# Patient Record
Sex: Female | Born: 1935 | Race: White | Hispanic: No | Marital: Married | Smoking: Former smoker
Health system: Southern US, Community
[De-identification: ages and names within clinical notes are randomized; demographics above are authoritative.]

## PROBLEM LIST (undated history)

## (undated) ENCOUNTER — Emergency Department (HOSPITAL_COMMUNITY): Admission: EM | Payer: Medicare Other | Source: Home / Self Care

## (undated) DIAGNOSIS — I10 Essential (primary) hypertension: Secondary | ICD-10-CM

## (undated) DIAGNOSIS — M1990 Unspecified osteoarthritis, unspecified site: Secondary | ICD-10-CM

## (undated) DIAGNOSIS — S32402A Unspecified fracture of left acetabulum, initial encounter for closed fracture: Secondary | ICD-10-CM

## (undated) DIAGNOSIS — I739 Peripheral vascular disease, unspecified: Secondary | ICD-10-CM

## (undated) DIAGNOSIS — I219 Acute myocardial infarction, unspecified: Secondary | ICD-10-CM

## (undated) DIAGNOSIS — F329 Major depressive disorder, single episode, unspecified: Secondary | ICD-10-CM

## (undated) DIAGNOSIS — H3500 Unspecified background retinopathy: Secondary | ICD-10-CM

## (undated) DIAGNOSIS — I2510 Atherosclerotic heart disease of native coronary artery without angina pectoris: Secondary | ICD-10-CM

## (undated) DIAGNOSIS — I679 Cerebrovascular disease, unspecified: Secondary | ICD-10-CM

## (undated) DIAGNOSIS — E039 Hypothyroidism, unspecified: Secondary | ICD-10-CM

## (undated) DIAGNOSIS — Z992 Dependence on renal dialysis: Secondary | ICD-10-CM

## (undated) DIAGNOSIS — E7800 Pure hypercholesterolemia, unspecified: Secondary | ICD-10-CM

## (undated) DIAGNOSIS — F32A Depression, unspecified: Secondary | ICD-10-CM

## (undated) DIAGNOSIS — I5032 Chronic diastolic (congestive) heart failure: Secondary | ICD-10-CM

## (undated) DIAGNOSIS — K449 Diaphragmatic hernia without obstruction or gangrene: Secondary | ICD-10-CM

## (undated) DIAGNOSIS — N186 End stage renal disease: Secondary | ICD-10-CM

## (undated) HISTORY — DX: Pure hypercholesterolemia, unspecified: E78.00

## (undated) HISTORY — DX: Unspecified fracture of left acetabulum, initial encounter for closed fracture: S32.402A

## (undated) HISTORY — DX: Atherosclerotic heart disease of native coronary artery without angina pectoris: I25.10

## (undated) HISTORY — DX: Essential (primary) hypertension: I10

## (undated) HISTORY — DX: Diaphragmatic hernia without obstruction or gangrene: K44.9

## (undated) HISTORY — PX: VITRECTOMY: SHX106

## (undated) HISTORY — PX: EYE SURGERY: SHX253

## (undated) HISTORY — DX: Peripheral vascular disease, unspecified: I73.9

## (undated) HISTORY — DX: Cerebrovascular disease, unspecified: I67.9

## (undated) HISTORY — DX: Unspecified osteoarthritis, unspecified site: M19.90

## (undated) HISTORY — DX: Dependence on renal dialysis: Z99.2

## (undated) HISTORY — DX: Unspecified background retinopathy: H35.00

## (undated) HISTORY — DX: Hypothyroidism, unspecified: E03.9

---
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
## 1998-08-14 ENCOUNTER — Other Ambulatory Visit: Admission: RE | Admit: 1998-08-14 | Discharge: 1998-08-14 | Payer: Self-pay | Admitting: Obstetrics and Gynecology

## 1999-03-27 ENCOUNTER — Encounter: Payer: Self-pay | Admitting: Ophthalmology

## 1999-04-01 ENCOUNTER — Ambulatory Visit (HOSPITAL_COMMUNITY): Admission: RE | Admit: 1999-04-01 | Discharge: 1999-04-01 | Payer: Self-pay | Admitting: Ophthalmology

## 1999-08-08 ENCOUNTER — Other Ambulatory Visit: Admission: RE | Admit: 1999-08-08 | Discharge: 1999-08-08 | Payer: Self-pay | Admitting: Obstetrics and Gynecology

## 2000-05-10 ENCOUNTER — Encounter: Payer: Self-pay | Admitting: Emergency Medicine

## 2000-05-10 ENCOUNTER — Inpatient Hospital Stay (HOSPITAL_COMMUNITY): Admission: EM | Admit: 2000-05-10 | Discharge: 2000-05-12 | Payer: Self-pay | Admitting: Emergency Medicine

## 2000-05-12 ENCOUNTER — Encounter: Payer: Self-pay | Admitting: Cardiology

## 2000-09-15 ENCOUNTER — Other Ambulatory Visit: Admission: RE | Admit: 2000-09-15 | Discharge: 2000-09-15 | Payer: Self-pay | Admitting: Obstetrics and Gynecology

## 2001-10-21 ENCOUNTER — Other Ambulatory Visit: Admission: RE | Admit: 2001-10-21 | Discharge: 2001-10-21 | Payer: Self-pay | Admitting: Obstetrics and Gynecology

## 2001-11-02 ENCOUNTER — Encounter: Payer: Self-pay | Admitting: Ophthalmology

## 2001-11-04 ENCOUNTER — Ambulatory Visit (HOSPITAL_COMMUNITY): Admission: RE | Admit: 2001-11-04 | Discharge: 2001-11-04 | Payer: Self-pay | Admitting: Ophthalmology

## 2002-11-06 ENCOUNTER — Other Ambulatory Visit: Admission: RE | Admit: 2002-11-06 | Discharge: 2002-11-06 | Payer: Self-pay | Admitting: Obstetrics and Gynecology

## 2002-12-01 ENCOUNTER — Emergency Department (HOSPITAL_COMMUNITY): Admission: EM | Admit: 2002-12-01 | Discharge: 2002-12-02 | Payer: Self-pay | Admitting: Emergency Medicine

## 2003-07-27 ENCOUNTER — Ambulatory Visit (HOSPITAL_COMMUNITY): Admission: RE | Admit: 2003-07-27 | Discharge: 2003-07-27 | Payer: Self-pay | Admitting: *Deleted

## 2003-07-27 ENCOUNTER — Encounter: Payer: Self-pay | Admitting: *Deleted

## 2003-07-27 HISTORY — PX: OTHER SURGICAL HISTORY: SHX169

## 2004-03-19 ENCOUNTER — Other Ambulatory Visit: Admission: RE | Admit: 2004-03-19 | Discharge: 2004-03-19 | Payer: Self-pay | Admitting: Obstetrics and Gynecology

## 2005-01-20 ENCOUNTER — Ambulatory Visit: Payer: Self-pay | Admitting: Pulmonary Disease

## 2005-04-10 ENCOUNTER — Other Ambulatory Visit: Admission: RE | Admit: 2005-04-10 | Discharge: 2005-04-10 | Payer: Self-pay | Admitting: Obstetrics and Gynecology

## 2005-05-26 ENCOUNTER — Ambulatory Visit: Payer: Self-pay | Admitting: Pulmonary Disease

## 2005-05-27 ENCOUNTER — Ambulatory Visit: Payer: Self-pay | Admitting: Pulmonary Disease

## 2005-05-29 ENCOUNTER — Ambulatory Visit: Payer: Self-pay | Admitting: Cardiology

## 2005-06-17 ENCOUNTER — Ambulatory Visit: Payer: Self-pay

## 2005-07-17 ENCOUNTER — Ambulatory Visit: Payer: Self-pay | Admitting: Cardiology

## 2005-08-14 ENCOUNTER — Ambulatory Visit: Payer: Self-pay | Admitting: Cardiology

## 2005-09-25 ENCOUNTER — Ambulatory Visit: Payer: Self-pay | Admitting: Pulmonary Disease

## 2005-09-30 ENCOUNTER — Ambulatory Visit: Payer: Self-pay | Admitting: Pulmonary Disease

## 2006-01-26 ENCOUNTER — Ambulatory Visit: Payer: Self-pay | Admitting: Pulmonary Disease

## 2006-02-26 ENCOUNTER — Ambulatory Visit: Payer: Self-pay | Admitting: Pulmonary Disease

## 2006-03-15 ENCOUNTER — Emergency Department (HOSPITAL_COMMUNITY): Admission: EM | Admit: 2006-03-15 | Discharge: 2006-03-15 | Payer: Self-pay | Admitting: Emergency Medicine

## 2006-03-30 ENCOUNTER — Ambulatory Visit: Payer: Self-pay | Admitting: Pulmonary Disease

## 2006-06-08 ENCOUNTER — Ambulatory Visit: Payer: Self-pay | Admitting: Pulmonary Disease

## 2006-06-16 ENCOUNTER — Ambulatory Visit: Payer: Self-pay | Admitting: Cardiology

## 2006-08-17 ENCOUNTER — Ambulatory Visit: Payer: Self-pay

## 2006-10-01 ENCOUNTER — Ambulatory Visit: Payer: Self-pay | Admitting: Pulmonary Disease

## 2006-10-13 ENCOUNTER — Ambulatory Visit: Payer: Self-pay | Admitting: Pulmonary Disease

## 2007-01-11 ENCOUNTER — Ambulatory Visit: Payer: Self-pay

## 2007-02-01 ENCOUNTER — Ambulatory Visit: Payer: Self-pay | Admitting: Pulmonary Disease

## 2007-02-08 ENCOUNTER — Ambulatory Visit: Payer: Self-pay | Admitting: Pulmonary Disease

## 2007-02-08 LAB — CONVERTED CEMR LAB
ALT: 23 units/L (ref 0–40)
BUN: 19 mg/dL (ref 6–23)
Basophils Absolute: 0 10*3/uL (ref 0.0–0.1)
Bilirubin, Direct: 0.1 mg/dL (ref 0.0–0.3)
Calcium: 9.2 mg/dL (ref 8.4–10.5)
Cholesterol: 158 mg/dL (ref 0–200)
Eosinophils Absolute: 0.1 10*3/uL (ref 0.0–0.6)
GFR calc Af Amer: 91 mL/min
GFR calc non Af Amer: 76 mL/min
Glucose, Bld: 164 mg/dL — ABNORMAL HIGH (ref 70–99)
Ketones, ur: NEGATIVE mg/dL
MCHC: 33.9 g/dL (ref 30.0–36.0)
MCV: 91.6 fL (ref 78.0–100.0)
Monocytes Relative: 9.9 % (ref 3.0–11.0)
Platelets: 234 10*3/uL (ref 150–400)
Potassium: 4 meq/L (ref 3.5–5.1)
RBC / HPF: NONE SEEN
RBC: 3.76 M/uL — ABNORMAL LOW (ref 3.87–5.11)
TSH: 2 microintl units/mL (ref 0.35–5.50)
Total CHOL/HDL Ratio: 3.7
Triglycerides: 119 mg/dL (ref 0–149)
Urine Glucose: NEGATIVE mg/dL
WBC: 4.7 10*3/uL (ref 4.5–10.5)
pH: 7.5 (ref 5.0–8.0)

## 2007-02-23 ENCOUNTER — Ambulatory Visit: Payer: Self-pay | Admitting: Cardiology

## 2007-02-23 LAB — CONVERTED CEMR LAB
BUN: 19 mg/dL (ref 6–23)
Basophils Absolute: 0 10*3/uL (ref 0.0–0.1)
Basophils Relative: 0.6 % (ref 0.0–1.0)
CO2: 29 meq/L (ref 19–32)
Calcium: 9.2 mg/dL (ref 8.4–10.5)
Chloride: 105 meq/L (ref 96–112)
Creatinine, Ser: 0.8 mg/dL (ref 0.4–1.2)
Hemoglobin: 12 g/dL (ref 12.0–15.0)
INR: 0.9 (ref 0.9–2.0)
MCHC: 34.6 g/dL (ref 30.0–36.0)
Monocytes Absolute: 0.5 10*3/uL (ref 0.2–0.7)
Monocytes Relative: 7.7 % (ref 3.0–11.0)
Platelets: 238 10*3/uL (ref 150–400)
Prothrombin Time: 11.4 s (ref 10.0–14.0)
RBC: 3.8 M/uL — ABNORMAL LOW (ref 3.87–5.11)
RDW: 12.9 % (ref 11.5–14.6)
aPTT: 26.1 s — ABNORMAL LOW (ref 26.5–36.5)

## 2007-03-02 ENCOUNTER — Ambulatory Visit (HOSPITAL_COMMUNITY): Admission: RE | Admit: 2007-03-02 | Discharge: 2007-03-02 | Payer: Self-pay | Admitting: Cardiology

## 2007-04-06 ENCOUNTER — Ambulatory Visit: Payer: Self-pay

## 2007-06-03 ENCOUNTER — Ambulatory Visit: Payer: Self-pay | Admitting: Pulmonary Disease

## 2007-08-22 ENCOUNTER — Ambulatory Visit: Payer: Self-pay | Admitting: Pulmonary Disease

## 2007-08-22 LAB — CONVERTED CEMR LAB
ALT: 25 units/L (ref 0–35)
AST: 24 units/L (ref 0–37)
Basophils Relative: 0.1 % (ref 0.0–1.0)
Bilirubin, Direct: 0.2 mg/dL (ref 0.0–0.3)
CO2: 23 meq/L (ref 19–32)
Calcium: 9.6 mg/dL (ref 8.4–10.5)
Eosinophils Absolute: 0.1 10*3/uL (ref 0.0–0.6)
Eosinophils Relative: 1.9 % (ref 0.0–5.0)
GFR calc Af Amer: 80 mL/min
GFR calc non Af Amer: 66 mL/min
Glucose, Bld: 171 mg/dL — ABNORMAL HIGH (ref 70–99)
HCT: 33.3 % — ABNORMAL LOW (ref 36.0–46.0)
Hemoglobin: 11.3 g/dL — ABNORMAL LOW (ref 12.0–15.0)
Hgb A1c MFr Bld: 8.7 % — ABNORMAL HIGH (ref 4.6–6.0)
Lymphocytes Relative: 25.4 % (ref 12.0–46.0)
MCV: 93.2 fL (ref 78.0–100.0)
Neutro Abs: 4.9 10*3/uL (ref 1.4–7.7)
Neutrophils Relative %: 63.7 % (ref 43.0–77.0)
Platelets: 221 10*3/uL (ref 150–400)
Sodium: 143 meq/L (ref 135–145)
Total Protein: 7.1 g/dL (ref 6.0–8.3)
WBC: 7.6 10*3/uL (ref 4.5–10.5)

## 2007-08-24 ENCOUNTER — Ambulatory Visit: Payer: Self-pay | Admitting: Internal Medicine

## 2007-08-26 ENCOUNTER — Ambulatory Visit: Payer: Self-pay

## 2007-09-02 ENCOUNTER — Ambulatory Visit: Payer: Self-pay | Admitting: Cardiology

## 2007-09-06 ENCOUNTER — Ambulatory Visit (HOSPITAL_COMMUNITY): Admission: RE | Admit: 2007-09-06 | Discharge: 2007-09-06 | Payer: Self-pay | Admitting: Pulmonary Disease

## 2007-09-20 ENCOUNTER — Ambulatory Visit: Payer: Self-pay | Admitting: Gastroenterology

## 2007-09-30 ENCOUNTER — Ambulatory Visit: Payer: Self-pay | Admitting: Cardiology

## 2007-10-04 ENCOUNTER — Ambulatory Visit: Payer: Self-pay | Admitting: Pulmonary Disease

## 2007-12-13 ENCOUNTER — Telehealth: Payer: Self-pay | Admitting: Pulmonary Disease

## 2007-12-14 ENCOUNTER — Telehealth (INDEPENDENT_AMBULATORY_CARE_PROVIDER_SITE_OTHER): Payer: Self-pay | Admitting: *Deleted

## 2007-12-30 ENCOUNTER — Telehealth (INDEPENDENT_AMBULATORY_CARE_PROVIDER_SITE_OTHER): Payer: Self-pay | Admitting: *Deleted

## 2008-01-23 DIAGNOSIS — E039 Hypothyroidism, unspecified: Secondary | ICD-10-CM | POA: Insufficient documentation

## 2008-01-23 DIAGNOSIS — I2510 Atherosclerotic heart disease of native coronary artery without angina pectoris: Secondary | ICD-10-CM | POA: Insufficient documentation

## 2008-01-24 ENCOUNTER — Ambulatory Visit: Payer: Self-pay | Admitting: Pulmonary Disease

## 2008-01-24 DIAGNOSIS — I739 Peripheral vascular disease, unspecified: Secondary | ICD-10-CM

## 2008-01-24 DIAGNOSIS — K449 Diaphragmatic hernia without obstruction or gangrene: Secondary | ICD-10-CM | POA: Insufficient documentation

## 2008-01-24 DIAGNOSIS — E7800 Pure hypercholesterolemia, unspecified: Secondary | ICD-10-CM | POA: Insufficient documentation

## 2008-01-24 DIAGNOSIS — I10 Essential (primary) hypertension: Secondary | ICD-10-CM

## 2008-01-24 DIAGNOSIS — M810 Age-related osteoporosis without current pathological fracture: Secondary | ICD-10-CM | POA: Insufficient documentation

## 2008-01-24 DIAGNOSIS — D638 Anemia in other chronic diseases classified elsewhere: Secondary | ICD-10-CM

## 2008-01-24 DIAGNOSIS — M1990 Unspecified osteoarthritis, unspecified site: Secondary | ICD-10-CM | POA: Insufficient documentation

## 2008-01-24 DIAGNOSIS — I679 Cerebrovascular disease, unspecified: Secondary | ICD-10-CM

## 2008-01-25 ENCOUNTER — Telehealth: Payer: Self-pay | Admitting: Pulmonary Disease

## 2008-02-03 ENCOUNTER — Telehealth (INDEPENDENT_AMBULATORY_CARE_PROVIDER_SITE_OTHER): Payer: Self-pay | Admitting: *Deleted

## 2008-02-10 ENCOUNTER — Ambulatory Visit: Payer: Self-pay | Admitting: Pulmonary Disease

## 2008-02-11 LAB — CONVERTED CEMR LAB
ALT: 21 units/L (ref 0–35)
AST: 23 units/L (ref 0–37)
Albumin: 3.9 g/dL (ref 3.5–5.2)
BUN: 17 mg/dL (ref 6–23)
Bacteria, UA: NEGATIVE
Basophils Relative: 0.3 % (ref 0.0–1.0)
Creatinine, Ser: 0.8 mg/dL (ref 0.4–1.2)
Crystals: NEGATIVE
Eosinophils Relative: 1.5 % (ref 0.0–5.0)
GFR calc Af Amer: 91 mL/min
GFR calc non Af Amer: 75 mL/min
HCT: 34.2 % — ABNORMAL LOW (ref 36.0–46.0)
HDL: 41.1 mg/dL (ref >39.0)
Hemoglobin: 11.2 g/dL — ABNORMAL LOW (ref 12.0–15.0)
Hgb A1c MFr Bld: 8.6 % — ABNORMAL HIGH (ref 4.6–6.0)
Leukocytes, UA: NEGATIVE
Lymphocytes Relative: 24.3 % (ref 12.0–46.0)
Monocytes Absolute: 0.6 10*3/uL (ref 0.2–0.7)
Mucus, UA: NEGATIVE
Neutrophils Relative %: 63.7 % (ref 43.0–77.0)
RDW: 12.9 % (ref 11.5–14.6)
Sodium: 141 meq/L (ref 135–145)
Specific Gravity, Urine: 1.02 (ref 1.000–1.03)
Total Protein, Urine: >=300 mg/dL — AB
Urobilinogen, UA: 0.2 (ref 0.0–1.0)
VLDL: 30 mg/dL (ref 0–40)
WBC: 6 10*3/uL (ref 4.5–10.5)
pH: 7 (ref 5.0–8.0)

## 2008-03-13 ENCOUNTER — Ambulatory Visit: Payer: Self-pay | Admitting: Cardiology

## 2008-03-13 ENCOUNTER — Ambulatory Visit: Payer: Self-pay

## 2008-04-06 HISTORY — PX: OTHER SURGICAL HISTORY: SHX169

## 2008-04-15 ENCOUNTER — Ambulatory Visit: Payer: Self-pay | Admitting: Pulmonary Disease

## 2008-04-15 ENCOUNTER — Inpatient Hospital Stay (HOSPITAL_COMMUNITY): Admission: EM | Admit: 2008-04-15 | Discharge: 2008-04-20 | Payer: Self-pay | Admitting: Emergency Medicine

## 2008-05-15 ENCOUNTER — Telehealth: Payer: Self-pay | Admitting: Pulmonary Disease

## 2008-05-15 ENCOUNTER — Ambulatory Visit: Payer: Self-pay | Admitting: Pulmonary Disease

## 2008-05-16 ENCOUNTER — Encounter: Payer: Self-pay | Admitting: Adult Health

## 2008-05-16 ENCOUNTER — Telehealth (INDEPENDENT_AMBULATORY_CARE_PROVIDER_SITE_OTHER): Payer: Self-pay | Admitting: *Deleted

## 2008-05-16 LAB — CONVERTED CEMR LAB
Crystals: NEGATIVE
Nitrite: NEGATIVE
Specific Gravity, Urine: 1.02 (ref 1.000–1.03)
Urine Glucose: NEGATIVE mg/dL
Urobilinogen, UA: 0.2 (ref 0.0–1.0)

## 2008-05-22 ENCOUNTER — Ambulatory Visit: Payer: Self-pay | Admitting: Pulmonary Disease

## 2008-05-23 ENCOUNTER — Ambulatory Visit (HOSPITAL_COMMUNITY): Admission: RE | Admit: 2008-05-23 | Discharge: 2008-05-23 | Payer: Self-pay | Admitting: Pulmonary Disease

## 2008-05-27 LAB — CONVERTED CEMR LAB
ALT: 19 units/L (ref 0–35)
AST: 22 units/L (ref 0–37)
Albumin: 4.1 g/dL (ref 3.5–5.2)
Alkaline Phosphatase: 147 units/L — ABNORMAL HIGH (ref 39–117)
BUN: 18 mg/dL (ref 6–23)
Basophils Relative: 0.3 % (ref 0.0–1.0)
CO2: 27 meq/L (ref 19–32)
Chloride: 101 meq/L (ref 96–112)
Creatinine, Ser: 0.9 mg/dL (ref 0.4–1.2)
Eosinophils Absolute: 0 10*3/uL (ref 0.0–0.7)
Eosinophils Relative: 0.4 % (ref 0.0–5.0)
GFR calc non Af Amer: 66 mL/min
Hgb A1c MFr Bld: 8.4 % — ABNORMAL HIGH (ref 4.6–6.0)
Lymphocytes Relative: 19.2 % (ref 12.0–46.0)
MCV: 91.9 fL (ref 78.0–100.0)
Monocytes Relative: 4.8 % (ref 3.0–12.0)
Neutrophils Relative %: 75.3 % (ref 43.0–77.0)
Platelets: 264 10*3/uL (ref 150–400)
Potassium: 4.3 meq/L (ref 3.5–5.1)
RBC: 3.74 M/uL — ABNORMAL LOW (ref 3.87–5.11)
WBC: 7.5 10*3/uL (ref 4.5–10.5)

## 2008-07-17 ENCOUNTER — Ambulatory Visit: Payer: Self-pay | Admitting: Pulmonary Disease

## 2008-09-10 ENCOUNTER — Ambulatory Visit: Payer: Self-pay | Admitting: Cardiology

## 2008-09-12 ENCOUNTER — Ambulatory Visit: Payer: Self-pay | Admitting: Pulmonary Disease

## 2008-10-30 ENCOUNTER — Encounter: Payer: Self-pay | Admitting: Pulmonary Disease

## 2008-12-12 ENCOUNTER — Ambulatory Visit: Payer: Self-pay | Admitting: Pulmonary Disease

## 2008-12-12 DIAGNOSIS — E559 Vitamin D deficiency, unspecified: Secondary | ICD-10-CM | POA: Insufficient documentation

## 2008-12-17 ENCOUNTER — Encounter: Payer: Self-pay | Admitting: Pulmonary Disease

## 2008-12-21 ENCOUNTER — Ambulatory Visit: Payer: Self-pay | Admitting: Pulmonary Disease

## 2008-12-23 LAB — CONVERTED CEMR LAB
ALT: 22 units/L (ref 0–35)
AST: 19 units/L (ref 0–37)
Albumin: 3.8 g/dL (ref 3.5–5.2)
Alkaline Phosphatase: 93 units/L (ref 39–117)
BUN: 21 mg/dL (ref 6–23)
Basophils Relative: 0 % (ref 0.0–3.0)
Chloride: 104 meq/L (ref 96–112)
Creatinine, Ser: 0.8 mg/dL (ref 0.4–1.2)
Eosinophils Relative: 0.1 % (ref 0.0–5.0)
Glucose, Bld: 255 mg/dL — ABNORMAL HIGH (ref 70–99)
HCT: 32.6 % — ABNORMAL LOW (ref 36.0–46.0)
HDL: 45.1 mg/dL (ref >39.0)
Monocytes Relative: 9.4 % (ref 3.0–12.0)
Neutrophils Relative %: 73.8 % (ref 43.0–77.0)
Platelets: 278 10*3/uL (ref 150–400)
Potassium: 3.8 meq/L (ref 3.5–5.1)
RBC: 3.46 M/uL — ABNORMAL LOW (ref 3.87–5.11)
Total CHOL/HDL Ratio: 7.7
Total Protein: 6.8 g/dL (ref 6.0–8.3)
Triglycerides: 173 mg/dL — ABNORMAL HIGH (ref 0–149)
WBC: 7.2 10*3/uL (ref 4.5–10.5)

## 2008-12-24 ENCOUNTER — Telehealth (INDEPENDENT_AMBULATORY_CARE_PROVIDER_SITE_OTHER): Payer: Self-pay | Admitting: *Deleted

## 2008-12-28 ENCOUNTER — Telehealth (INDEPENDENT_AMBULATORY_CARE_PROVIDER_SITE_OTHER): Payer: Self-pay | Admitting: *Deleted

## 2009-01-04 ENCOUNTER — Telehealth (INDEPENDENT_AMBULATORY_CARE_PROVIDER_SITE_OTHER): Payer: Self-pay | Admitting: *Deleted

## 2009-01-22 ENCOUNTER — Encounter: Payer: Self-pay | Admitting: Pulmonary Disease

## 2009-03-11 ENCOUNTER — Encounter: Payer: Self-pay | Admitting: Cardiology

## 2009-03-11 ENCOUNTER — Ambulatory Visit: Payer: Self-pay | Admitting: Cardiology

## 2009-03-18 ENCOUNTER — Telehealth (INDEPENDENT_AMBULATORY_CARE_PROVIDER_SITE_OTHER): Payer: Self-pay

## 2009-03-19 ENCOUNTER — Ambulatory Visit: Payer: Self-pay

## 2009-03-19 ENCOUNTER — Encounter: Payer: Self-pay | Admitting: Cardiology

## 2009-03-21 ENCOUNTER — Ambulatory Visit: Payer: Self-pay

## 2009-04-23 ENCOUNTER — Ambulatory Visit: Payer: Self-pay | Admitting: Pulmonary Disease

## 2009-04-25 ENCOUNTER — Ambulatory Visit: Payer: Self-pay | Admitting: Pulmonary Disease

## 2009-04-25 LAB — CONVERTED CEMR LAB
AST: 28 units/L (ref 0–37)
BUN: 20 mg/dL (ref 6–23)
Basophils Relative: 0.6 % (ref 0.0–3.0)
Bilirubin, Direct: 0.2 mg/dL (ref 0.0–0.3)
Eosinophils Relative: 1.5 % (ref 0.0–5.0)
GFR calc non Af Amer: 74.92 mL/min (ref >60)
HCT: 33.7 % — ABNORMAL LOW (ref 36.0–46.0)
LDL Cholesterol: 104 mg/dL — ABNORMAL HIGH (ref 0–99)
Lymphs Abs: 1.4 10*3/uL (ref 0.7–4.0)
MCV: 97.1 fL (ref 78.0–100.0)
Monocytes Absolute: 0.5 10*3/uL (ref 0.1–1.0)
Platelets: 186 10*3/uL (ref 150.0–400.0)
Potassium: 4.1 meq/L (ref 3.5–5.1)
Sodium: 144 meq/L (ref 135–145)
TSH: 3.46 microintl units/mL (ref 0.35–5.50)
Total Bilirubin: 0.9 mg/dL (ref 0.3–1.2)
Total CHOL/HDL Ratio: 3
VLDL: 22.6 mg/dL (ref 0.0–40.0)
WBC: 5.4 10*3/uL (ref 4.5–10.5)

## 2009-04-30 ENCOUNTER — Telehealth: Payer: Self-pay | Admitting: Cardiology

## 2009-08-28 ENCOUNTER — Ambulatory Visit: Payer: Self-pay | Admitting: Pulmonary Disease

## 2009-08-31 DIAGNOSIS — H3090 Unspecified chorioretinal inflammation, unspecified eye: Secondary | ICD-10-CM | POA: Insufficient documentation

## 2009-08-31 LAB — CONVERTED CEMR LAB
BUN: 24 mg/dL — ABNORMAL HIGH (ref 6–23)
CO2: 26 meq/L (ref 19–32)
Chloride: 108 meq/L (ref 96–112)
Creatinine, Ser: 1 mg/dL (ref 0.4–1.2)

## 2009-09-03 ENCOUNTER — Ambulatory Visit: Payer: Self-pay | Admitting: Pulmonary Disease

## 2009-09-06 ENCOUNTER — Encounter (INDEPENDENT_AMBULATORY_CARE_PROVIDER_SITE_OTHER): Payer: Self-pay | Admitting: *Deleted

## 2009-09-17 ENCOUNTER — Ambulatory Visit: Payer: Self-pay | Admitting: Cardiology

## 2009-11-13 ENCOUNTER — Telehealth: Payer: Self-pay | Admitting: Pulmonary Disease

## 2009-12-10 ENCOUNTER — Encounter (INDEPENDENT_AMBULATORY_CARE_PROVIDER_SITE_OTHER): Payer: Self-pay | Admitting: *Deleted

## 2010-01-28 ENCOUNTER — Encounter: Payer: Self-pay | Admitting: Pulmonary Disease

## 2010-02-26 ENCOUNTER — Ambulatory Visit: Payer: Self-pay | Admitting: Pulmonary Disease

## 2010-03-03 LAB — CONVERTED CEMR LAB
AST: 25 units/L (ref 0–37)
Alkaline Phosphatase: 100 units/L (ref 39–117)
Basophils Absolute: 0 10*3/uL (ref 0.0–0.1)
Eosinophils Relative: 1.3 % (ref 0.0–5.0)
GFR calc non Af Amer: 57.78 mL/min (ref >60)
HCT: 34.5 % — ABNORMAL LOW (ref 36.0–46.0)
LDL Cholesterol: 104 mg/dL — ABNORMAL HIGH (ref 0–99)
Lymphs Abs: 1.5 10*3/uL (ref 0.7–4.0)
MCV: 93.4 fL (ref 78.0–100.0)
Monocytes Absolute: 0.5 10*3/uL (ref 0.1–1.0)
Platelets: 212 10*3/uL (ref 150.0–400.0)
Potassium: 4.2 meq/L (ref 3.5–5.1)
RDW: 12.6 % (ref 11.5–14.6)
Sodium: 141 meq/L (ref 135–145)
TSH: 3.15 microintl units/mL (ref 0.35–5.50)
Total Bilirubin: 0.7 mg/dL (ref 0.3–1.2)
Total CHOL/HDL Ratio: 4
VLDL: 25 mg/dL (ref 0.0–40.0)

## 2010-04-08 ENCOUNTER — Encounter: Payer: Self-pay | Admitting: Cardiology

## 2010-04-09 ENCOUNTER — Ambulatory Visit: Payer: Self-pay | Admitting: Cardiology

## 2010-04-09 ENCOUNTER — Ambulatory Visit: Payer: Self-pay

## 2010-05-13 ENCOUNTER — Telehealth (INDEPENDENT_AMBULATORY_CARE_PROVIDER_SITE_OTHER): Payer: Self-pay | Admitting: *Deleted

## 2010-05-14 ENCOUNTER — Telehealth (INDEPENDENT_AMBULATORY_CARE_PROVIDER_SITE_OTHER): Payer: Self-pay | Admitting: *Deleted

## 2010-05-15 ENCOUNTER — Ambulatory Visit: Payer: Self-pay | Admitting: Pulmonary Disease

## 2010-05-22 ENCOUNTER — Encounter: Payer: Self-pay | Admitting: Pulmonary Disease

## 2010-06-12 ENCOUNTER — Telehealth (INDEPENDENT_AMBULATORY_CARE_PROVIDER_SITE_OTHER): Payer: Self-pay | Admitting: *Deleted

## 2010-08-26 ENCOUNTER — Ambulatory Visit: Payer: Self-pay | Admitting: Pulmonary Disease

## 2010-08-30 LAB — CONVERTED CEMR LAB
BUN: 23 mg/dL (ref 6–23)
Basophils Absolute: 0 10*3/uL (ref 0.0–0.1)
Bilirubin Urine: NEGATIVE
CO2: 23 meq/L (ref 19–32)
Eosinophils Absolute: 0.1 10*3/uL (ref 0.0–0.7)
GFR calc non Af Amer: 61.21 mL/min (ref >60)
Glucose, Bld: 257 mg/dL — ABNORMAL HIGH (ref 70–99)
HCT: 35.2 % — ABNORMAL LOW (ref 36.0–46.0)
Hemoglobin: 12.1 g/dL (ref 12.0–15.0)
Leukocytes, UA: NEGATIVE
Lymphocytes Relative: 21 % (ref 12.0–46.0)
Lymphs Abs: 1.5 10*3/uL (ref 0.7–4.0)
MCHC: 34.5 g/dL (ref 30.0–36.0)
MCV: 96.3 fL (ref 78.0–100.0)
Monocytes Absolute: 0.7 10*3/uL (ref 0.1–1.0)
Neutro Abs: 4.9 10*3/uL (ref 1.4–7.7)
Potassium: 4.1 meq/L (ref 3.5–5.1)
RDW: 13.3 % (ref 11.5–14.6)
Urine Glucose: 100 mg/dL
Urobilinogen, UA: 0.2 (ref 0.0–1.0)

## 2010-10-16 ENCOUNTER — Ambulatory Visit: Payer: Self-pay | Admitting: Cardiology

## 2010-11-26 ENCOUNTER — Telehealth: Payer: Self-pay | Admitting: Cardiology

## 2010-12-09 ENCOUNTER — Ambulatory Visit
Admission: RE | Admit: 2010-12-09 | Discharge: 2010-12-09 | Payer: Self-pay | Source: Home / Self Care | Attending: Cardiology | Admitting: Cardiology

## 2010-12-11 LAB — CONVERTED CEMR LAB
ALT: 21 units/L (ref 0–35)
AST: 26 units/L (ref 0–37)
Cholesterol: 166 mg/dL (ref 0–200)
HDL: 43 mg/dL (ref >39.00)
Total Protein: 6.5 g/dL (ref 6.0–8.3)
VLDL: 24.4 mg/dL (ref 0.0–40.0)

## 2010-12-23 ENCOUNTER — Telehealth: Payer: Self-pay | Admitting: Cardiology

## 2011-01-06 ENCOUNTER — Telehealth (INDEPENDENT_AMBULATORY_CARE_PROVIDER_SITE_OTHER): Payer: Self-pay | Admitting: *Deleted

## 2011-01-06 NOTE — Medication Information (Signed)
Summary: [facility]  [facility]   Imported By: Sherian Rein 05/29/2010 13:16:50  _____________________________________________________________________  External Attachment:    Type:   Image     Comment:   External Document

## 2011-01-06 NOTE — Progress Notes (Signed)
Summary: SAMPLES  Phone Note Call from Patient Call back at Home Phone [phone]   Caller: Patient Call For: NADEL Summary of Call: PT IS FINISHED WITH HER OLD INSULIN AND IS WANTING TO PICK UP THE NEW ONE TOMORROW SAMPLES  Initial call taken by: Lacinda Axon,  May 13, 2010 11:23 AM  Follow-up for Phone Call        leigh do you know what she is talking about   Philipp Deputy [facility]  May 13, 2010 12:15 PM    per SN---change the lantus to the levimir  with the same directions as the lantus.   samples and rx are ok to give.  thanks Randell Loop CMA  May 13, 2010 2:14 PM   Additional Follow-up for Phone Call Additional follow up Details #1::        pt scheduled to see tp 6/8 at 11:45 to go over new insulin Additional Follow-up by: Philipp Deputy CMA,  May 13, 2010 2:28 PM

## 2011-01-06 NOTE — Assessment & Plan Note (Signed)
Summary: rov 4 months///kp   Primary Care Provider:  Alroy Dust MD  CC:  6 month ROV & review of mult medical problems....  History of Present Illness: 76 y/o WF here for a follow up visit... she has mult med problems as noted below...    ~  May09: she had a fall at home w/ left fem neck fracture, treated by DrOlin w/ closed reduction and percut screw fixation on 04/16/08... rehab stay after that and now getting around better although she notes that some of the screws are working their way out and DrOlin thinks they may have to be removed later... she recently had a cortisone shot in hip w/ incr BS after that.  ~  Jun09: presented w/ nausea for eval-  AbdSonar was neg;  labs were OK w/ CBC= 11.8 Hg, Chem= BS 287 & A1c 8.4...   ~  Jan10:  BS's are up & down w/ freq low sugars 3-6AM despite adjustments in Lantus and Humalog... we discussed meds & adjustments/ offered Endocrine referral- but she refuses... saw DrWall 10/09- stable w/o changes meds... c/o LBP and saw DrRamos who tried DCN100 but too sedated- awaiting shots to start soon... also had BMD done by DrTomblin- ?results, but no meds perscribed x Vit D 50K for level of 8 when recently checked.  ~  May10:  still c/o alot of aching and arthritis pain... she's seen DrOlin, DrBrooks, DrBednarz... she had f/u Myoview, ABI's and CDopplers per Cards (see below)... she notes BS's about the same & all over the place...  ~  Sep10:  she continues on meds, Lantus, Humalog w/ sm adjustments trying to fine tune her sugars but she still notes fluctuating values... she has retinopathy w/ macular edema followed by [facility].   ~  February 26, 2010:  6 month ROV & no changes reported- sugars are still "all over the place" & A1c not well controlled> offered Endocrine f/u but she declines... we discussed change to LEVEMIR long acting insulin but she just got a 69mo supply of the Lantus... BP doing well on meds;  no CP/ palpit/ etc;  Chol OK on the Crestor20 + Fish  Oil.    Current Problem List:  RETINOPATHY (ICD-363.20) - followed at [facility] DrKarup (we don't have notes)... 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  HYPERTENSION (ICD-401.9) - controlled on ATENOLOL 25mg /d, QUINAPRIL 40mg /d, FELODIPINE 10mg /d & LASIX 20mg /d... BP= 120/56 today and 120-130 at home... she denies CP, palpit, change in dyspnea, PND, etc...  CAD (ICD-414.00) & CHF (ICD-428.0) - on MEDS above... DrWall's note of 4/10 is reviewed- she had some CP noted therefore he repeated her Myoview (neg), no change in meds- denies palpit, change in dyspnea or edema... we reviewed risk factor reduction strategy...  ~  Myoview 4/10 was negative- no scar or ischemia, EF= 73%...  ~  labs 3/11 showed BUN=18, Crear=1.0, BNP=107...  CEREBROVASCULAR DISEASE (ICD-437.9) - on ASA daily.  ~  CDoppler 4/08 showing stable bilat carotid dis w/ 40-59% bilat stenoses...  ~  repeat studies 4/09 showed mild smooth heterogeneous plaque w/ 40-59% bilat ICA stenoses.  ~  f/u studies 4/10 showed mild to mod heterogen plaque bilat- 40-59% bilat stenoses, stable.  PERIPHERAL VASCULAR DISEASE (ICD-443.9) - abnormal ABI's and eval by DrDowney in 3/08 (& prev DrGupta 2004)... she had arteriogram w/ bilat dis but no indication for PTA... 20% bilat ostial renal lesions also noted... no complaints  of claudication etc, ambulates w/ cane, BP controlled.  ~  LE Vasc study 4/10 showed known diffuse bilat arterial occlusive dis- stable large vessel dis & progressive sm vessel dis w/ toe/ brachial indicies at risk for tissue loss... DrWall plans vasc consult.  HYPERCHOLESTEROLEMIA (ICD-272.0) - on CRESTOR 20mg /d + FishOil & CoQ10...  ~  FLP 3/08 showed TChol 158, Tg 119, HDL 43, LDL 92  ~  FLP 3/09 showed TChol 173, TG 151, HDL 41, LDL 102  ~  FLP 1/10 off med x 16mo showed TChol 349, TG 173, HDL 45, LDL 258... rec change Statin, but she prefers Cres20.  ~  FLP 5/10  showed TChol 179, TG 113, HDL 52, LDL 104... rec> continue Cres20.  ~  FLP 3/11 showed TChol 179, TG 125, HDL 50, LDL 104  DIABETES MELLITUS (ICD-250.00) - very brittle DM, insulin dependent and prev Rx by Endocrinology... she has retinopathy, neuropathy, and nephropathy w/ proteinuria noted (on ACE & Felodipine)... currently taking LANTUS 40 u daily, and HUMALOG 5-10 before each meal... we have discussed adjustments designed to improve her overall control without resulting hypoglycemia...  ~  labs 9/08 showed BS= 171, HgA1c= 8.7  ~  labs 3/09 showed BS= 195, HgA1c= 8.6.Marland KitchenMarland Kitchen rec to slowly incr her insulin doses.  ~  labs 6/09 showed BS= 287, HgA1c= 8.4.Marland KitchenMarland Kitchen offered Endocrine referral but she refuses, rec incr Lantus & Humalog.  ~  labs 1/10 showed BS= 255, HgA1c= 9.4.Marland Kitchen. rec> incr Lantus and Humalog slowly.  ~  labs 5/10 showed BS= 264, A1c= 8.5.Marland KitchenMarland Kitchen still needs incr insulin.  ~  labs 9/10 showed BS= 220, A1c= 8.1  ~  labs 3/11 showed BS= 181, A1c= 9.0..Marland Kitchen she is reluctant to incr insulin, rec to switch to LEVEMIR 40u/d.  HYPOTHYROIDISM (ICD-244.9) - on SYNTHROID 26mcg/d...    ~  labs 3/08 showed TSH = 2.0  ~  labs 3/09 showed TSH = 2.93  ~  labs 5/10 showed TSH = 3.46  ~  labs 3/11 showed TSH= 3.15  HIATAL HERNIA (ICD-553.3) - she is off her Protonix and taking PRILOSEC 20mg /d...  ~  EGD 12/03 showed 5cmHH, tortuous & spastic esop, chr atrophic gastritis... Nexium recommended.  ~  colonoscopy 12/03 was WNL x for decr rectal tone noted... incr fiber recommended.  DEGENERATIVE JOINT DISEASE (ICD-715.90) - "my back's in bad shape" per DrOlin & Shon Baton- nothing can be done surgically to help her...  ~  5/09: fall w/ left hip fx- Rx DrOlin w/ closed reduction & percut screw fixation... she's been told she needs THR.  ~  Back eval DrRamos 12/09 w/ epid steroid shot 1/10 without much benefit...  OSTEOPOROSIS (ICD-733.00), & VITAMIN D DEFICIENCY (ICD-268.9) - followed by GYN= DrTomblin w/ BMD 12/09 per pt  ?results... not on bisphos Rx- she takes Calcium, Vits, & had Vit D defic w/ level= 8 per pt hx treated transiently w/ 50K weekly- now on 1000 u daily & level pending at DrTomblin's office.  ANEMIA OF CHRONIC DISEASE (ICD-285.29)  ~  labs 9/08 showed Hg= 11.3   ~  labs 3/09 showed Hg= 11.2  ~  labs 5/10 showed Hg= 11.4  ~  labs 3/11 showed Hg= 11.6   Allergies: 1)  ! Motrin  Comments:  Nurse/Medical Assistant: The patient's medications and allergies were reviewed with the patient and were updated in the Medication and Allergy Lists.  Past History:  Past Medical History:  RETINOPATHY (ICD-363.20) HYPERTENSION (ICD-401.9) CAD (ICD-414.00) CHF (ICD-428.0) CEREBROVASCULAR DISEASE (ICD-437.9) PERIPHERAL  VASCULAR DISEASE (ICD-443.9) HYPERCHOLESTEROLEMIA (ICD-272.0) DIABETES MELLITUS (ICD-250.00) HYPOTHYROIDISM (ICD-244.9) HIATAL HERNIA (ICD-553.3) DEGENERATIVE JOINT DISEASE (ICD-715.90) OSTEOPOROSIS (ICD-733.00) VITAMIN D DEFICIENCY (ICD-268.9) ANEMIA OF CHRONIC DISEASE (ICD-285.29)  Past Surgical History: Abdominal aortogram. 07/27/2003 by Veneda Melter M.D. Bilateral lower extremity angiogram. 07/27/2003 by Veneda Melter M.D. Selective angiography, right superficial femoral artery, right and left iliac arteries. 07/27/2003 by Dr. Chales Abrahams Measurement of gradient right and left iliac arteries. 07/27/2003 by Veneda Melter M.D.  S/P bilat cataract surgery in 2000 & 2002 by DrJacklin S/P repair left hip fracture 5/09 by DrOlin  Family History: Reviewed history from 05/22/2008 and no changes required. mother died age 70 from cancer and heart problems father died age 74 from cancer 1 brother alive age 67  Social History: Reviewed history from 05/22/2008 and no changes required. retired married 2 children non smoker no etoh  Review of Systems      See HPI       The patient complains of vision loss and dyspnea on exertion.  The patient denies anorexia, fever, weight loss,  weight gain, decreased hearing, hoarseness, chest pain, syncope, peripheral edema, prolonged cough, headaches, hemoptysis, abdominal pain, melena, hematochezia, severe indigestion/heartburn, hematuria, incontinence, muscle weakness, suspicious skin lesions, transient blindness, difficulty walking, depression, unusual weight change, abnormal bleeding, enlarged lymph nodes, and angioedema.    Vital Signs:  Patient profile:   76 year old female Height:      63 inches Weight:      145.50 pounds O2 Sat:      98 % on Room air Temp:     97.1 degrees F oral Pulse rate:   58 / minute BP sitting:   120 / 56  (left arm) Cuff size:   regular  Vitals Entered By: Randell Loop CMA (February 26, 2010 11:23 AM)  O2 Sat at Rest %:  98 O2 Flow:  Room air CC: 6 month ROV & review of mult medical problems... Is Patient Diabetic? Yes Pain Assessment Patient in pain? no      Comments meds updated today   Physical Exam  Additional Exam:  WD, WN, 76 y/o WF in NAD... GENERAL:  Alert & oriented; pleasant & cooperative... HEENT:  [place]/AT, EOM-wnl, PERRLA, EACs-clear, TMs-wnl, NOSE-clear, THROAT-clear & wnl. NECK:  Supple w/ fairROM; no JVD; normal carotid impulses w/ bilat L>R briuts; no thyromegaly or nodules palpated; no lymphadenopathy. CHEST:  Clear to P & A; without wheezes/ rales/ or rhonchi heard... HEART:  Regular Rhythm; gr 1/6 SEM without rubs or gallops detected... ABDOMEN:  Soft & nontender; normal bowel sounds; no organomegaly or masses palpated... EXT: s/p left hip fract  & surg-  mild arthritic changes; no varicose veins/ +venous insuffic/ tr edema. note: left leg is sl larger than right leg, neg homan's, not tender etc. NEURO:  CN's intact;  no focal neuro deficits... DERM:  No lesions noted; no rash etc...    Impression & Recommendations:  Problem # 1:  HYPERTENSION (ICD-401.9) Controlled-  same meds. Her updated medication list for this problem includes:    Atenolol 25 Mg Tabs  (Atenolol) .Marland Kitchen... Take 1 tablet by mouth once a day    Felodipine 10 Mg Tb24 (Felodipine) .Marland Kitchen... Take 1 tablet by mouth once a day    Quinapril Hcl 40 Mg Tabs (Quinapril hcl) .Marland Kitchen... Take 1 tablet by mouth once a day    Furosemide 20 Mg Tabs (Furosemide) .Marland Kitchen... Take 1 tablet by mouth once a day  Orders: TLB-Lipid Panel (80061-LIPID) TLB-BMP (Basic Metabolic Panel-BMET) (80048-METABOL) TLB-Hepatic/Liver  Function Pnl (80076-HEPATIC) TLB-CBC Platelet - w/Differential (85025-CBCD) TLB-TSH (Thyroid Stimulating Hormone) (84443-TSH) TLB-BNP (B-Natriuretic Peptide) (83880-BNPR) TLB-A1C / Hgb A1C (Glycohemoglobin) (83036-A1C)  Problem # 2:  CAD (ICD-414.00) Followed by DrWall-  stable, no changes made... Her updated medication list for this problem includes:    Adult Aspirin Ec Low Strength 81 Mg Tbec (Aspirin) .Marland Kitchen... Take 1 tablet by mouth once a day    Nitrostat 0.4 Mg Subl (Nitroglycerin) .Marland Kitchen... As needed    Atenolol 25 Mg Tabs (Atenolol) .Marland Kitchen... Take 1 tablet by mouth once a day    Felodipine 10 Mg Tb24 (Felodipine) .Marland Kitchen... Take 1 tablet by mouth once a day    Quinapril Hcl 40 Mg Tabs (Quinapril hcl) .Marland Kitchen... Take 1 tablet by mouth once a day    Furosemide 20 Mg Tabs (Furosemide) .Marland Kitchen... Take 1 tablet by mouth once a day  Problem # 3:  PERIPHERAL VASCULAR DISEASE (ICD-443.9) On ASA daily... severe PVD & DrWall plans vascular consult...  Problem # 4:  HYPERCHOLESTEROLEMIA (ICD-272.0) FLP is reasonable but would like LDL <70... discussed diet + exercise etc... Her updated medication list for this problem includes:    Crestor 20 Mg Tabs (Rosuvastatin calcium) .Marland Kitchen... 1 by mouth daily  Problem # 5:  DIABETES MELLITUS (ICD-250.00) Control still isn't good... we discussed diet/ exercise/ switch to [facility] but she has 3 mo supply of Lantus to use up first... Her updated medication list for this problem includes:    Adult Aspirin Ec Low Strength 81 Mg Tbec (Aspirin) .Marland Kitchen... Take 1 tablet by mouth once a day     Quinapril Hcl 40 Mg Tabs (Quinapril hcl) .Marland Kitchen... Take 1 tablet by mouth once a day    Lantus 100 Unit/ml Soln (Insulin glargine) ..... Inject 40 u daily...    Humalog 100 Unit/ml Soln (Insulin lispro (human)) ..... Inject 5-15 units subcutaneously as needed before meals  Problem # 6:  HYPOTHYROIDISM (ICD-244.9) Continue same med... Her updated medication list for this problem includes:    Synthroid 75 Mcg Tabs (Levothyroxine sodium) .Marland Kitchen... 1 tab by mouth once daily  Problem # 7:  MULT MEDICAL PROBLEMS AS NOTED>>> She requested Amitrip 10mg  Qhs... OK.  Complete Medication List: 1)  Adult Aspirin Ec Low Strength 81 Mg Tbec (Aspirin) .... Take 1 tablet by mouth once a day 2)  Nitrostat 0.4 Mg Subl (Nitroglycerin) .... As needed 3)  Atenolol 25 Mg Tabs (Atenolol) .... Take 1 tablet by mouth once a day 4)  Felodipine 10 Mg Tb24 (Felodipine) .... Take 1 tablet by mouth once a day 5)  Quinapril Hcl 40 Mg Tabs (Quinapril hcl) .... Take 1 tablet by mouth once a day 6)  Furosemide 20 Mg Tabs (Furosemide) .... Take 1 tablet by mouth once a day 7)  Crestor 20 Mg Tabs (Rosuvastatin calcium) .Marland Kitchen.. 1 by mouth daily 8)  Co-q 10 Omega-3 Fish Oil Caps (Coenzyme q10-fish oil-vit e) .... Take one by mouth once daily 9)  Lantus 100 Unit/ml Soln (Insulin glargine) .... Inject 40 u daily... 10)  Humalog 100 Unit/ml Soln (Insulin lispro (human)) .... Inject 5-15 units subcutaneously as needed before meals 11)  Synthroid 75 Mcg Tabs (Levothyroxine sodium) .Marland Kitchen.. 1 tab by mouth once daily 12)  Omeprazole 20 Mg Cpdr (Omeprazole) .Marland Kitchen.. 1 by mouth two times a day 13)  Caltrate 600+d 600-400 Mg-unit Tabs (Calcium carbonate-vitamin d) .... Take 1 tablet by mouth once a day 14)  Multivitamins Tabs (Multiple vitamin) .... Take 1 tablet by mouth once a day 15)  Vitamin D 1000 Unit Tabs (Cholecalciferol) .... Take one by mouth once daily 16)  Tylenol Extra Strength 500 Mg Tabs (Acetaminophen) .... As needed 17)  Amitriptyline  Hcl 10 Mg Tabs (Amitriptyline hcl) .... Take 1 tab by mouth at bedtime as directed...  Other Orders: Prescription Created Electronically [id number])  Patient Instructions: 1)  Today we updated your med list- see below.... 2)  We refilled your Crestor & wrote a new perscription for AMITRIPTYLINE 10mg - try 1/2 to 1 tab at bedtime.Marland KitchenMarland Kitchen 3)  We discussed possibly splitting the dose of LANTUS, and we can consider a change to the other long acting med= Levimir.Marland KitchenMarland Kitchen 4)  Today we did your follow up FASTING blood work... please call the "phone tree" in a few days for your lab results.Marland KitchenMarland Kitchen 5)  Call for any problems...' 6)  Please schedule a follow-up appointment in 6 months. Prescriptions: AMITRIPTYLINE HCL 10 MG TABS (AMITRIPTYLINE HCL) take 1 tab by mouth at bedtime as directed...  #90 x 3   Entered and Authorized by:   Michele Mcalpine MD   Signed by:   Michele Mcalpine MD on 02/26/2010   Method used:   Print then Give to Patient   RxID:   [id number] CRESTOR 20 MG TABS (ROSUVASTATIN CALCIUM) 1 by mouth daily  #90 x 3   Entered and Authorized by:   Michele Mcalpine MD   Signed by:   Michele Mcalpine MD on 02/26/2010   Method used:   Print then Give to Patient   RxID:   [id number]

## 2011-01-06 NOTE — Progress Notes (Signed)
Summary: reaction  Phone Note Call from Patient   Caller: Patient Call For: nadel Summary of Call: reactions to levemir no energy vision blurry Initial call taken by: Rickard Patience,  June 12, 2010 11:07 AM  Follow-up for Phone Call        The patient c/o weakness, blurry vision and itching at injection site since starting on Levemir. She says her glucose readings have been the same as on Lantus. Please advise.Michel Bickers [facility]  June 12, 2010 12:06 PM  Additional Follow-up for Phone Call Additional follow up Details #1::        per SN---if she thinks she was better on lantus then ok to restart on the lantus as before and stop the levemir.  thanks Randell Loop CMA  June 12, 2010 2:02 PM     Additional Follow-up for Phone Call Additional follow up Details #2::    Spoke with pt and advised of the above recs per SN.  Pt verbalized understanding. Follow-up by: Vernie Murders,  June 12, 2010 2:07 PM

## 2011-01-06 NOTE — Assessment & Plan Note (Signed)
Summary: 6 month return/mhh   Primary Care Provider:  Alroy Dust MD  CC:  6 month ROV & review of mult medical problems....  History of Present Illness: 76 y/o Nicole [name] here for a follow up visit... she has mult med problems as noted below...     Cards followed by DrWall- HBP, diffuse vascular dis, r/o CAD, hyperlipidemia> stable w/ severe disease on meds below...   Ortho followed by DrOlin/ Ramos at [facility] for prev left fem neck fracture 5/09 w/ closed reduction & percut screw fixation... severe LBP & pain all over- given epid steroid shots w/ min relief; pain Rx w/ DCN in the past was too strong (sedated) therefore try Tramadol...   Her main problem is brittle DM> difficult contol on Lantus/ Humalog w/ freq adjustments & tried on Levemir but intol ("the shots burned & made me weak")... she refused Endocrine referral... she has DM retinopathy w/ macular edema followed in the [facility] clinic...   ~  February 26, 2010:  6 month ROV & no changes reported- sugars are still "all over the place" & A1c not well controlled> offered Endocrine appt but she still declines... we discussed change to LEVEMIR long acting insulin (intol she says w/ burning at the site & weakness)... BP doing well on meds;  no CP/ palpit/ etc;  Chol OK on the Crestor20 + Fish Oil.   ~  August 26, 2010:  she saw DrWall 5/11- vasculopath, no angina, no TIAs, BP controlled... had f/u CDopplers= stable 40-59% bilat ICAstenoses, no change...  she is c/o pain all over & we discussed trial Tramadol for pain + f/u DrRamos to see if she is a candidate for another epid shot...  BP remains controlled;  Chol OK on Cres20;  DM remains volatile w/ down to 63 this AM therefore had to eat today> check non-fasting labs, OK Flu shot...   Current Problem List:  RETINOPATHY (ICD-363.20) - followed at [facility] DrKarup (we don't have notes)... 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  HYPERTENSION  (ICD-401.9) - controlled on ATENOLOL 25mg /d, QUINAPRIL 40mg /d, FELODIPINE 10mg /d & LASIX 20mg /d... BP= 120/60 today and 120-130 at home... she denies CP, palpit, change in dyspnea, PND, etc...  CAD (ICD-414.00) & CHF (ICD-428.0) - on MEDS above... DrWall's notes are reviewed- no change in meds- denies recurrent angina, palpit, change in dyspnea or edema... we reviewed risk factor reduction strategy...  ~  Myoview 4/10 was negative- no scar or ischemia, EF= 73%...  ~  labs 3/11 showed BUN=18, Crear=1.0, BNP=107...  CEREBROVASCULAR DISEASE (ICD-437.9) - on ASA daily... no cerebral ischemic symptoms.  ~  CDoppler 4/08 showing stable bilat carotid dis w/ 40-59% bilat stenoses...  ~  repeat studies 4/09 showed mild smooth heterogeneous plaque w/ 40-59% bilat ICA stenoses.  ~  f/u studies 4/10 showed mild to mod heterogen plaque bilat- 40-59% bilat stenoses, stable.  ~  CDopplers 5/11 showed stable mild irreg plaque, 40-59% bilat ICAstenoses, no change.  PERIPHERAL VASCULAR DISEASE (ICD-443.9) - abnormal ABI's and eval by DrDowney in 3/08 (& prev DrGupta 2004)... she had arteriogram w/ bilat dis but no indication for PTA... 20% bilat ostial renal lesions also noted... no complaints of claudication etc, ambulates w/ cane, BP controlled.  ~  LE Vasc study 4/10 showed known diffuse bilat arterial occlusive dis- stable large vessel dis & progressive sm vessel dis w/ toe/ brachial indicies at risk for tissue loss... DrWall plans vasc  consult.  HYPERCHOLESTEROLEMIA (ICD-272.0) - on CRESTOR 20mg /d + FishOil & CoQ10...  ~  FLP 3/08 showed TChol 158, Tg 119, HDL 43, LDL 92  ~  FLP 3/09 showed TChol 173, TG 151, HDL 41, LDL 102  ~  FLP 1/10 off med x 35mo showed TChol 349, TG 173, HDL 45, LDL 258... rec change Statin, but she prefers Cres20.  ~  FLP 5/10 showed TChol 179, TG 113, HDL 52, LDL 104... rec> continue Cres20.  ~  FLP 3/11 showed TChol 179, TG 125, HDL 50, LDL 104  DIABETES MELLITUS (ICD-250.00) -  very brittle DM, insulin dependent and prev Rx by Endocrinology... she has retinopathy, neuropathy, and nephropathy w/ proteinuria noted (on ACE & Felodipine)... currently taking LANTUS 40 u daily, and HUMALOG 5-15 before each meal... we have discussed adjustments designed to improve her overall control without resulting hypoglycemia...  ~  labs 9/08 showed BS= 171, HgA1c= 8.7  ~  labs 3/09 showed BS= 195, HgA1c= 8.6.Marland KitchenMarland Kitchen rec to slowly incr her insulin doses.  ~  labs 6/09 showed BS= 287, HgA1c= 8.4.Marland KitchenMarland Kitchen offered Endocrine referral but she refuses, rec incr Lantus & Humalog.  ~  labs 1/10 showed BS= 255, HgA1c= 9.4.Marland Kitchen. rec> incr Lantus and Humalog slowly.  ~  labs 5/10 showed BS= 264, A1c= 8.5.Marland KitchenMarland Kitchen still needs incr insulin.  ~  labs 9/10 showed BS= 220, A1c= 8.1  ~  labs 3/11 showed BS= 181, A1c= 9.0..Marland Kitchen she is reluctant to incr insulin, rec to switch to LEVEMIR 40u/d (she was intol).  ~  labs 9/11 showed BS= 257, A1c= 9.1.Marland KitchenMarland Kitchen rec slowly incr Lantus by 2u increments until FBS is <150...  HYPOTHYROIDISM (ICD-244.9) - on SYNTHROID 36mcg/d...    ~  labs 3/08 showed TSH = 2.0  ~  labs 3/09 showed TSH = 2.93  ~  labs 5/10 showed TSH = 3.46  ~  labs 3/11 showed TSH= 3.15  HIATAL HERNIA (ICD-553.3) - she is off her Protonix and taking PRILOSEC 20mg /d...  ~  EGD 12/03 showed 5cmHH, tortuous & spastic esop, chr atrophic gastritis... Nexium recommended.  ~  colonoscopy 12/03 was WNL x for decr rectal tone noted... incr fiber recommended.  DEGENERATIVE JOINT DISEASE (ICD-715.90) - "my back's in bad shape" per DrOlin & Shon Baton- nothing can be done surgically to help her...  ~  5/09: fall w/ left hip fx- Rx DrOlin w/ closed reduction & percut screw fixation... she's been told she needs THR.  ~  Back eval DrRamos 12/09 w/ epid steroid shot 1/10 without much benefit...  ~  6/11:  she reports repeat epid shots by DrRamos helped somewhat & she will f/u w/ him...  ~  9/11:  c/o hurting all over & TRAMADOL 50mg  Qid Prn  tried...  OSTEOPOROSIS (ICD-733.00), & VITAMIN D DEFICIENCY (ICD-268.9) - followed by GYN= DrTomblin w/ BMD 12/09 per pt ?results... not on bisphos Rx- she takes Calcium, Vits, & had Vit D defic w/ level= 8 per pt hx treated transiently w/ 50K weekly- now on 1000 u daily & level pending at DrTomblin's office.  ANEMIA OF CHRONIC DISEASE (ICD-285.29)  ~  labs 9/08 showed Hg= 11.3   ~  labs 3/09 showed Hg= 11.2  ~  labs 5/10 showed Hg= 11.4  ~  labs 3/11 showed Hg= 11.6   Preventive Screening-Counseling & Management  Alcohol-Tobacco     Smoking Status: quit > 6 months     Year Started: 1956     Year Quit: 1976  Allergies: 1)  !  Motrin  Comments:  Nurse/Medical Assistant: The patient's medications and allergies were reviewed with the patient and were updated in the Medication and Allergy Lists.  Past History:  Past Medical History: RETINOPATHY (ICD-363.20) HYPERTENSION (ICD-401.9) CAD (ICD-414.00) CHF (ICD-428.0) CEREBROVASCULAR DISEASE (ICD-437.9) PERIPHERAL VASCULAR DISEASE (ICD-443.9) HYPERCHOLESTEROLEMIA (ICD-272.0) DIABETES MELLITUS (ICD-250.00) HYPOTHYROIDISM (ICD-244.9) HIATAL HERNIA (ICD-553.3) DEGENERATIVE JOINT DISEASE (ICD-715.90) OSTEOPOROSIS (ICD-733.00) VITAMIN D DEFICIENCY (ICD-268.9) ANEMIA OF CHRONIC DISEASE (ICD-285.29)  Past Surgical History: Abdominal aortogram. 07/27/2003 by Veneda Melter M.D. Bilateral lower extremity angiogram. 07/27/2003 by Veneda Melter M.D. Selective angiography, right superficial femoral artery, right and left iliac arteries. 07/27/2003 by Dr. Chales Abrahams Measurement of gradient right and left iliac arteries. 07/27/2003 by Veneda Melter M.D.  S/P bilat cataract surgery in 2000 & 2002 by DrJacklin S/P repair left hip fracture 5/09 by DrOlin  Family History: Reviewed history from 05/22/2008 and no changes required. mother died age 43 from cancer and heart problems father died age 51 from cancer 1 brother alive age 39  Social  History: Reviewed history from 05/22/2008 and no changes required. retired married 2 children non smoker no alcohol  Review of Systems      See HPI       The patient complains of decreased hearing, dyspnea on exertion, muscle weakness, and difficulty walking.  The patient denies anorexia, fever, weight loss, weight gain, vision loss, hoarseness, chest pain, syncope, peripheral edema, prolonged cough, headaches, hemoptysis, abdominal pain, melena, hematochezia, severe indigestion/heartburn, hematuria, incontinence, suspicious skin lesions, transient blindness, depression, unusual weight change, abnormal bleeding, enlarged lymph nodes, and angioedema.    Vital Signs:  Patient profile:   76 year old female Height:      63 inches Weight:      148.13 pounds BMI:     26.33 O2 Sat:      96 % on Room air Temp:     96.7 degrees F oral Pulse rate:   61 / minute BP sitting:   120 / 60  (left arm) Cuff size:   regular  Vitals Entered By: Randell Loop CMA (August 26, 2010 9:43 AM)  O2 Sat at Rest %:  96 O2 Flow:  Room air CC: 6 month ROV & review of mult medical problems... Is Patient Diabetic? Yes Pain Assessment Patient in pain? yes      Onset of pain  pain all over with arthritis Comments no changes in meds today    Physical Exam  Additional Exam:  WD, WN, 76 y/o Nicole [name] in NAD... GENERAL:  Alert & oriented; pleasant & cooperative... HEENT:  [place]/AT, EOM-wnl, Eyes per W[facility], EACs-clear, TMs-wnl, NOSE-clear, THROAT-clear & wnl. NECK:  Supple w/ fairROM; no JVD; normal carotid impulses w/ bilat L>R briuts; no thyromegaly or nodules palpated; no lymphadenopathy. CHEST:  Clear to P & A; without wheezes/ rales/ or rhonchi heard... HEART:  Regular Rhythm; gr 1/6 SEM without rubs or gallops detected... ABDOMEN:  Soft & nontender; normal bowel sounds; no organomegaly or masses palpated... EXT: s/p left hip fract  & surg-  mod arthritic changes; no varicose veins/ +venous insuffic/ tr  edema. note: left leg is sl larger than right leg, neg homan's, not tender etc. NEURO:  CN's intact;  no focal neuro deficits... DERM:  No lesions noted; no rash etc...    MISC. Report  Procedure date:  08/26/2010  Findings:      BMP (METABOL)   Sodium                    138  mEq/L                   135-145   Potassium                 4.1 mEq/L                   3.5-5.1   Chloride                  106 mEq/L                   96-112   Carbon Dioxide            23 mEq/L                    19-32   Glucose              [H]  257 mg/dL                   65-78   BUN                       23 mg/dL                    4-69   Creatinine                1.0 mg/dL                   6.2-9.5   Calcium                   9.5 mg/dL                   2.8-41.3   GFR                       61.21 mL/min                >60  Hemoglobin A1C (A1C)   Hemoglobin A1C       [H]  9.1 %                       4.6-6.5  CBC Platelet w/Diff (CBCD)   White Cell Count          7.2 K/uL                    4.5-10.5   Red Cell Count       [L]  3.65 Mil/uL                 3.87-5.11   Hemoglobin                12.1 g/dL                   24.4-01.0   Hematocrit           [L]  35.2 %                      36.0-46.0   MCV                       96.3 fl                     78.0-100.0   Platelet Count  211.0 K/uL                  150.0-400.0   Neutrophil %              68.0 %                      43.0-77.0   Lymphocyte %              21.0 %                      12.0-46.0   Monocyte %                9.5 %                       3.0-12.0   Eosinophils%              1.2 %                       0.0-5.0   Basophils %               0.3 %                       0.0-3.0  Comments:      UDip w/Micro (URINE)   Color                     YELLOW   Clarity                   CLEAR                       Clear   Specific Gravity          >=1.030                     1.000 - 1.030   Urine Ph                  5.0                          5.0-8.0   Protein                   >=300                       Negative   Urine Glucose             100                         Negative   Ketones                   TRACE                       Negative   Urine Bilirubin           NEGATIVE                    Negative   Blood                     TRACE-INTACT  Negative   Urobilinogen              0.2                         0.0 - 1.0   Leukocyte Esterace        NEGATIVE                    Negative   Nitrite                   NEGATIVE                    Negative   Urine WBC                 0-2/hpf                     0-2/hpf   Urine RBC                 0-2/hpf                     0-2/hpf   Urine Mucus               Presence of                 None   Urine Epith               Rare(0-4/hpf)               Rare(0-4/hpf)   Urine Bacteria            Rare(<10/hpf)               None   Hyaline Casts             Presence of                 None   Impression & Recommendations:  Problem # 1:  HYPERTENSION (ICD-401.9) BP controlled on meds>  continue same... Her updated medication list for this problem includes:    Atenolol 25 Mg Tabs (Atenolol) .Marland Kitchen... Take 1 tablet by mouth once a day    Felodipine 10 Mg Tb24 (Felodipine) .Marland Kitchen... Take 1 tablet by mouth once a day    Quinapril Hcl 40 Mg Tabs (Quinapril hcl) .Marland Kitchen... Take 1 tablet by mouth once a day    Furosemide 20 Mg Tabs (Furosemide) .Marland Kitchen... Take 1 tablet by mouth once a day  Orders: TLB-BMP (Basic Metabolic Panel-BMET) (80048-METABOL) TLB-A1C / Hgb A1C (Glycohemoglobin) (83036-A1C) TLB-CBC Platelet - w/Differential (85025-CBCD) TLB-Udip w/ Micro (81001-URINE)  Problem # 2:  CAD (ICD-414.00) R/O CAD> neg myoview, never had cath... followed by DrWall on ASA, BBlocker, ACE, CCB, & Diuretic... continue same Rx. Her updated medication list for this problem includes:    Adult Aspirin Ec Low Strength 81 Mg Tbec (Aspirin) .Marland Kitchen... Take 1 tablet by mouth once a day    Nitrostat 0.4 Mg Subl  (Nitroglycerin) .Marland Kitchen... 1 tablet under tongue at onset of chest pain; you may repeat every 5 minutes for up to 3 doses.    Atenolol 25 Mg Tabs (Atenolol) .Marland Kitchen... Take 1 tablet by mouth once a day    Felodipine 10 Mg Tb24 (Felodipine) .Marland Kitchen... Take 1 tablet by mouth once a day    Quinapril Hcl 40 Mg Tabs (Quinapril hcl) .Marland Kitchen... Take 1 tablet by mouth once a day  Furosemide 20 Mg Tabs (Furosemide) .Marland Kitchen... Take 1 tablet by mouth once a day  Problem # 3:  PERIPHERAL VASCULAR DISEASE (ICD-443.9) She has cerebrovasc dis & ASPVD as noted... continue ASA, meds, exercise program as she is able... followed by DrWall for Cards, & prev saw DrDowney as well...  Problem # 4:  HYPERCHOLESTEROLEMIA (ICD-272.0) Continue diet + Cres20... Her updated medication list for this problem includes:    Crestor 20 Mg Tabs (Rosuvastatin calcium) .Marland Kitchen... 1 by mouth daily  Problem # 5:  DIABETES MELLITUS (ICD-250.00) Difficult control>  prev managed by Endocrine, she refuses to return... she remains on Lantus + Humalog as noted... REC>  gradually incr the Lantus by 2u increments until FBS is <150 range... Her updated medication list for this problem includes:    Adult Aspirin Ec Low Strength 81 Mg Tbec (Aspirin) .Marland Kitchen... Take 1 tablet by mouth once a day    Quinapril Hcl 40 Mg Tabs (Quinapril hcl) .Marland Kitchen... Take 1 tablet by mouth once a day    Lantus 100 Unit/ml Soln (Insulin glargine) .Marland KitchenMarland KitchenMarland KitchenMarland Kitchen 40 untis subcutaneously daily    Humalog 100 Unit/ml Soln (Insulin lispro (human)) ..... Inject 5-15 units subcutaneously as needed before meals  Problem # 6:  HYPOTHYROIDISM (ICD-244.9) Stable on the Synthroid... Her updated medication list for this problem includes:    Synthroid 75 Mcg Tabs (Levothyroxine sodium) .Marland Kitchen... 1 tab by mouth once daily  Problem # 7:  DEGENERATIVE JOINT DISEASE (ICD-715.90) Severe DJD and LBP makes it difficult for her to exercise & get around... [facility]Ramos [facility], we will try Tramadol Rx & she  will f/u w/ them... Her updated medication list for this problem includes:    Adult Aspirin Ec Low Strength 81 Mg Tbec (Aspirin) .Marland Kitchen... Take 1 tablet by mouth once a day    Tylenol Extra Strength 500 Mg Tabs (Acetaminophen) .Marland Kitchen... As needed    Tramadol Hcl 50 Mg Tabs (Tramadol hcl) .Marland Kitchen... Take 1 tab by mouth up to 4 times daily as needed for pain...  Problem # 8:  OSTEOPOROSIS (ICD-733.00) Managed by GYN- DrTomblin...  Problem # 9:  OTHER MEDICAL PROBLEMS AS NOTED>>>  Complete Medication List: 1)  Adult Aspirin Ec Low Strength 81 Mg Tbec (Aspirin) .... Take 1 tablet by mouth once a day 2)  Nitrostat 0.4 Mg Subl (Nitroglycerin) .Marland Kitchen.. 1 tablet under tongue at onset of chest pain; you may repeat every 5 minutes for up to 3 doses. 3)  Atenolol 25 Mg Tabs (Atenolol) .... Take 1 tablet by mouth once a day 4)  Felodipine 10 Mg Tb24 (Felodipine) .... Take 1 tablet by mouth once a day 5)  Quinapril Hcl 40 Mg Tabs (Quinapril hcl) .... Take 1 tablet by mouth once a day 6)  Furosemide 20 Mg Tabs (Furosemide) .... Take 1 tablet by mouth once a day 7)  Crestor 20 Mg Tabs (Rosuvastatin calcium) .Marland Kitchen.. 1 by mouth daily 8)  Co-q 10 Omega-3 Fish Oil Caps (Coenzyme q10-fish oil-vit e) .... Take one by mouth once daily 9)  Lantus 100 Unit/ml Soln (Insulin glargine) .... 40 untis subcutaneously daily 10)  Humalog 100 Unit/ml Soln (Insulin lispro (human)) .... Inject 5-15 units subcutaneously as needed before meals 11)  Synthroid 75 Mcg Tabs (Levothyroxine sodium) .Marland Kitchen.. 1 tab by mouth once daily 12)  Omeprazole 20 Mg Cpdr (Omeprazole) .Marland Kitchen.. 1 by mouth two times a day 13)  Caltrate 600+d 600-400 Mg-unit Tabs (Calcium carbonate-vitamin d) .... Take 1 tablet by mouth once  a day 14)  Multivitamins Tabs (Multiple vitamin) .... Take 1 tablet by mouth once a day 15)  Vitamin D 1000 Unit Tabs (Cholecalciferol) .... Take one by mouth once daily 16)  Amitriptyline Hcl 10 Mg Tabs (Amitriptyline hcl) .... Take 1 tab by mouth at  bedtime as directed... 17)  Tylenol Extra Strength 500 Mg Tabs (Acetaminophen) .... As needed 18)  Tramadol Hcl 50 Mg Tabs (Tramadol hcl) .... Take 1 tab by mouth up to 4 times daily as needed for pain...  Other Orders: Flu Vaccine 71yrs + MEDICARE PATIENTS (E4540) Administration Flu vaccine - MCR (J8119)  Patient Instructions: 1)  Today we updated your med list- see below.... 2)  We wrote a new perscription for TRAMADOL to take up to 4 times daily as needed for pain.Marland KitchenMarland Kitchen 3)  Today we did your non-fasting blood work... please call the "phone tree" in a few days for your lab results.Marland KitchenMarland Kitchen 4)  We included a urinalysis as well/// 5)  We gave you the 2011 seasonal flu vaccine today... 6)  Call for any questions> be sure to follow up w/ DrRamos. 7)  Please schedule a follow-up appointment in 6 months. Prescriptions: TRAMADOL HCL 50 MG TABS (TRAMADOL HCL) take 1 tab by mouth up to 4 times daily as needed for pain...  #100 x 12   Entered and Authorized by:   Michele Mcalpine MD   Signed by:   Michele Mcalpine MD on 08/26/2010   Method used:   Print then Give to Patient   RxID:   [id number]   Flu Vaccine Consent Questions     Do you have a history of severe allergic reactions to this vaccine? no    Any prior history of allergic reactions to egg and/or gelatin? no    Do you have a sensitivity to the preservative Thimersol? no    Do you have a past history of Guillan-Barre Syndrome? no    Do you currently have an acute febrile illness? no    Have you ever had a severe reaction to latex? no    Vaccine information given and explained to patient? yes    Are you currently pregnant? no    Lot Number:[id number]   Exp Date:06/06/2011   Site Given  Left Deltoid [id number]]  Randell Loop CMA  August 26, 2010 10:54 AM   .lbmedflu

## 2011-01-06 NOTE — Miscellaneous (Signed)
Summary: Orders Update  Clinical Lists Changes  Orders: Added new Test order of Carotid Duplex (Carotid Duplex) - Signed 

## 2011-01-06 NOTE — Miscellaneous (Signed)
Summary: Omeprazole and synthroid refills to [facility]  Medications Added OMEPRAZOLE 20 MG CPDR (OMEPRAZOLE) 1 by mouth two times a day       Clinical Lists Changes  Medications: Changed medication from PRILOSEC 20 MG  CPDR (OMEPRAZOLE) Take 1 tablet by mouth once daily to OMEPRAZOLE 20 MG CPDR (OMEPRAZOLE) 1 by mouth two times a day - Signed Rx of OMEPRAZOLE 20 MG CPDR (OMEPRAZOLE) 1 by mouth two times a day;  #180 x 3;  Signed;  Entered by: Michel Bickers CMA;  Authorized by: Michele Mcalpine MD;  Method used: Faxed to [facility]IL [facility]*, , ,   , Ph: [phone], Fax: [phone] Rx of SYNTHROID 75 MCG TABS (LEVOTHYROXINE SODIUM) 1 tab by mouth once daily;  #90 x 3;  Signed;  Entered by: Michel Bickers CMA;  Authorized by: Michele Mcalpine MD;  Method used: Faxed to [facility] [place]*, , ,   , Ph: [phone], Fax: [phone]    Prescriptions: SYNTHROID 75 MCG TABS (LEVOTHYROXINE SODIUM) 1 tab by mouth once daily  #90 x 3   Entered by:   Michel Bickers CMA   Authorized by:   Michele Mcalpine MD   Signed by:   Michel Bickers CMA on 12/10/2009   Method used:   Faxed to ...       [facility]* (mail-order)             ,          Ph: [phone]       Fax: [phone]   RxID:   [id number] OMEPRAZOLE 20 MG CPDR (OMEPRAZOLE) 1 by mouth two times a day  #180 x 3   Entered by:   Michel Bickers CMA   Authorized by:   Michele Mcalpine MD   Signed by:   Michel Bickers CMA on 12/10/2009   Method used:   Faxed to ...       [facility]* (mail-order)             ,          Ph: [phone]       Fax: [phone]   RxID:   [id number]

## 2011-01-06 NOTE — Assessment & Plan Note (Signed)
Summary: per check out/sf  Medications Added NITROSTAT 0.4 MG SUBL (NITROGLYCERIN) 1 tablet under tongue at onset of chest pain; you may repeat every 5 minutes for up to 3 doses.        Visit Type:  rov Primary Provider:  Alroy Dust MD  CC:  left leg edema...otherwise no other complaints today.  History of Present Illness: Nicole [name] comes in today for further management of her diffuse vascular diseases.  She's having no symptoms of TIAs or mini strokes. She denies any angina or ischemic equivalent.  Her blood pressure done relatively good control.  She does have some nocturnal pain in her feet but no significant change in any claudication.  She is very compliant with her medications.  I reviewed her recent blood work with her today and her hemoglobin A1c and LDL not at goal.  She has no significant problems with edema. She denies orthopnea or PND.  Clinical Reports Reviewed:  Carotid Doppler:  03/13/2008:  Impressions: Bilateral carotid artery disease that has been stable over serial exams.  Elevated right subclavian artery velocities. 40-59% bilateral ICA stenoses.  04/06/2007:  Impressions: Stable bilateral carotid artery disease. 40-59% bilateral ICA stenosis.  Nuclear Study:  03/19/2009:  Excerise capacity: Adenosine study with no exercise  ECG impression: No significant ST segment change suggestive of ischemia  Overall impression: There is no sign of scar or ischemia.  Olga Millers, MD, [facility]   08/26/2007:    Impression:  EF: 76%  Exercise Capacity: Adenosine study with no exercise. Blood Pressure Response: Normal blood pressure response. Clinical Symptoms: There is dyspnea. ECG Impression: No significant ST segment change suggestive of ischemia.  Overall Impression: Normal stress nuclear study.  06/25/2004:  Impression : Normal adenosine Cardiolite study. Ejection fraction was 78%.   Current Medications (verified): 1)  Adult Aspirin Ec  Low Strength 81 Mg  Tbec (Aspirin) .... Take 1 Tablet By Mouth Once A Day 2)  Nitrostat 0.4 Mg Subl (Nitroglycerin) .Marland Kitchen.. 1 Tablet Under Tongue At Onset of Chest Pain; You May Repeat Every 5 Minutes For Up To 3 Doses. 3)  Atenolol 25 Mg  Tabs (Atenolol) .... Take 1 Tablet By Mouth Once A Day 4)  Felodipine 10 Mg  Tb24 (Felodipine) .... Take 1 Tablet By Mouth Once A Day 5)  Quinapril Hcl 40 Mg  Tabs (Quinapril Hcl) .... Take 1 Tablet By Mouth Once A Day 6)  Furosemide 20 Mg  Tabs (Furosemide) .... Take 1 Tablet By Mouth Once A Day 7)  Crestor 20 Mg Tabs (Rosuvastatin Calcium) .Marland Kitchen.. 1 By Mouth Daily 8)  Co-Q 10 Omega-3 Fish Oil  Caps (Coenzyme Q10-Fish Oil-Vit E) .... Take One By Mouth Once Daily 9)  Lantus 100 Unit/ml Soln (Insulin Glargine) .... Inject 40 U Daily... 10)  Humalog 100 Unit/ml Soln (Insulin Lispro (Human)) .... Inject 5-15 Units Subcutaneously As Needed Before Meals 11)  Synthroid 75 Mcg Tabs (Levothyroxine Sodium) .Marland Kitchen.. 1 Tab By Mouth Once Daily 12)  Omeprazole 20 Mg Cpdr (Omeprazole) .Marland Kitchen.. 1 By Mouth Two Times A Day 13)  Caltrate 600+d 600-400 Mg-Unit  Tabs (Calcium Carbonate-Vitamin D) .... Take 1 Tablet By Mouth Once A Day 14)  Multivitamins   Tabs (Multiple Vitamin) .... Take 1 Tablet By Mouth Once A Day 15)  Vitamin D 1000 Unit Tabs (Cholecalciferol) .... Take One By Mouth Once Daily 16)  Tylenol Extra Strength 500 Mg Tabs (Acetaminophen) .... As Needed 17)  Amitriptyline Hcl 10 Mg Tabs (Amitriptyline Hcl) .... Take 1 Tab  By Mouth At Bedtime As Directed...  Allergies: 1)  ! Motrin  Past History:  Past Medical History: Last updated: 02/26/2010  RETINOPATHY (ICD-363.20) HYPERTENSION (ICD-401.9) CAD (ICD-414.00) CHF (ICD-428.0) CEREBROVASCULAR DISEASE (ICD-437.9) PERIPHERAL VASCULAR DISEASE (ICD-443.9) HYPERCHOLESTEROLEMIA (ICD-272.0) DIABETES MELLITUS (ICD-250.00) HYPOTHYROIDISM (ICD-244.9) HIATAL HERNIA (ICD-553.3) DEGENERATIVE JOINT DISEASE  (ICD-715.90) OSTEOPOROSIS (ICD-733.00) VITAMIN D DEFICIENCY (ICD-268.9) ANEMIA OF CHRONIC DISEASE (ICD-285.29)  Past Surgical History: Last updated: 02/26/2010 Abdominal aortogram. 07/27/2003 by Veneda Melter M.D. Bilateral lower extremity angiogram. 07/27/2003 by Veneda Melter M.D. Selective angiography, right superficial femoral artery, right and left iliac arteries. 07/27/2003 by Dr. Chales Abrahams Measurement of gradient right and left iliac arteries. 07/27/2003 by Veneda Melter M.D.  S/P bilat cataract surgery in 2000 & 2002 by DrJacklin S/P repair left hip fracture 5/09 by DrOlin  Family History: Last updated: 06/17/08 mother died age 48 from cancer and heart problems father died age 50 from cancer 1 brother alive age 73  Social History: Last updated: June 17, 2008 retired married 2 children non smoker no etoh  Risk Factors: Smoking Status: quit > 6 months (04/23/2009)  Review of Systems       negative other than history of present illness  Vital Signs:  Patient profile:   76 year old female Height:      63 inches Weight:      145 pounds BMI:     25.78 Pulse rate:   60 / minute Pulse rhythm:   regular BP sitting:   130 / 70  (left arm) Cuff size:   large  Vitals Entered By: Danielle Rankin, CMA (Apr 09, 2010 11:54 AM)  Physical Exam  General:  Well developed, well nourished, in no acute distress. Head:  normocephalic and atraumatic Eyes:  PERRLA/EOM intact; conjunctiva and lids normal. Neck:  Neck supple, no JVD. No masses, thyromegaly or abnormal cervical nodes. Chest Nicole [name]:  no deformities or breast masses noted Lungs:  Clear bilaterally to auscultation and percussion. Heart:  PMI nondisplaced, regular rate and rhythm, no gallop. Bilateral carotid bruits right greater than left Msk:  decreased ROM.  decreased ROM.   Pulses:  absent in the feet which is baseline Extremities:  tight scan but no pitting edema which is good for her no sign of injury or nonhealing areas.  Nails in decent shape Neurologic:  Alert and oriented x 3. Skin:  Intact without lesions or rashes. Psych:  Normal affect.   Impression & Recommendations:  Problem # 1:  CAD (ICD-414.00) Assessment Unchanged  Her updated medication list for this problem includes:    Adult Aspirin Ec Low Strength 81 Mg Tbec (Aspirin) .Marland Kitchen... Take 1 tablet by mouth once a day    Nitrostat 0.4 Mg Subl (Nitroglycerin) .Marland Kitchen... 1 tablet under tongue at onset of chest pain; you may repeat every 5 minutes for up to 3 doses.    Atenolol 25 Mg Tabs (Atenolol) .Marland Kitchen... Take 1 tablet by mouth once a day    Felodipine 10 Mg Tb24 (Felodipine) .Marland Kitchen... Take 1 tablet by mouth once a day    Quinapril Hcl 40 Mg Tabs (Quinapril hcl) .Marland Kitchen... Take 1 tablet by mouth once a day  Problem # 2:  CHF (ICD-428.0) Assessment: Improved  Her updated medication list for this problem includes:    Adult Aspirin Ec Low Strength 81 Mg Tbec (Aspirin) .Marland Kitchen... Take 1 tablet by mouth once a day    Nitrostat 0.4 Mg Subl (Nitroglycerin) .Marland Kitchen... 1 tablet under tongue at onset of chest pain; you may repeat every 5 minutes for  up to 3 doses.    Atenolol 25 Mg Tabs (Atenolol) .Marland Kitchen... Take 1 tablet by mouth once a day    Felodipine 10 Mg Tb24 (Felodipine) .Marland Kitchen... Take 1 tablet by mouth once a day    Quinapril Hcl 40 Mg Tabs (Quinapril hcl) .Marland Kitchen... Take 1 tablet by mouth once a day    Furosemide 20 Mg Tabs (Furosemide) .Marland Kitchen... Take 1 tablet by mouth once a day  Problem # 3:  CEREBROVASCULAR DISEASE (ICD-437.9) Assessment: Unchanged Carotid Dopplers are stable today by ultrasound. She is antegrade flow in both vertebrals. No change in treatment. Rescan in a year.  Problem # 4:  PERIPHERAL VASCULAR DISEASE (ICD-443.9) Assessment: Unchanged Her ABIs were stable last year compared to several years ago. She is at significant risk of tissue loss in her toes. Her arteriogram in 2000 and a demonstrated diffuse disease in both superficial femoral arteries. Medical therapy has  been recommended. I have given her strict instructions to watch her toes and to avoid any injury or break in the skin.  Problem # 5:  HYPERTENSION (ICD-401.9) Assessment: Improved  Her updated medication list for this problem includes:    Adult Aspirin Ec Low Strength 81 Mg Tbec (Aspirin) .Marland Kitchen... Take 1 tablet by mouth once a day    Atenolol 25 Mg Tabs (Atenolol) .Marland Kitchen... Take 1 tablet by mouth once a day    Felodipine 10 Mg Tb24 (Felodipine) .Marland Kitchen... Take 1 tablet by mouth once a day    Quinapril Hcl 40 Mg Tabs (Quinapril hcl) .Marland Kitchen... Take 1 tablet by mouth once a day    Furosemide 20 Mg Tabs (Furosemide) .Marland Kitchen... Take 1 tablet by mouth once a day  Problem # 6:  HYPERCHOLESTEROLEMIA (ICD-272.0) Her lipids were reviewed. Her LDL is not at goal. We'll get it down as low as possible and increase her Crestor to 40 mg q. day. Followup blood work here in 6 weeks. Samples were given. Her updated medication list for this problem includes:    Crestor 20 Mg Tabs (Rosuvastatin calcium) .Marland Kitchen... 1 by mouth daily  Problem # 7:  DIABETES MELLITUS (ICD-250.00) Assessment: Deteriorated her That last hemoglobin A1c was 9%. I have emphasized the importance of tight blood sugar control of her to avoid further complications. Her updated medication list for this problem includes:    Adult Aspirin Ec Low Strength 81 Mg Tbec (Aspirin) .Marland Kitchen... Take 1 tablet by mouth once a day    Quinapril Hcl 40 Mg Tabs (Quinapril hcl) .Marland Kitchen... Take 1 tablet by mouth once a day    Lantus 100 Unit/ml Soln (Insulin glargine) ..... Inject 40 u daily...    Humalog 100 Unit/ml Soln (Insulin lispro (human)) ..... Inject 5-15 units subcutaneously as needed before meals  Her updated medication list for this problem includes:    Adult Aspirin Ec Low Strength 81 Mg Tbec (Aspirin) .Marland Kitchen... Take 1 tablet by mouth once a day    Quinapril Hcl 40 Mg Tabs (Quinapril hcl) .Marland Kitchen... Take 1 tablet by mouth once a day    Lantus 100 Unit/ml Soln (Insulin glargine) .....  Inject 40 u daily...    Humalog 100 Unit/ml Soln (Insulin lispro (human)) ..... Inject 5-15 units subcutaneously as needed before meals  Patient Instructions: 1)  Your physician recommends that you schedule a follow-up appointment in: 6 MONTHS WITH DR Milan Clare 2)  Your physician recommends that you return for lab work in:6 WEEKS LIPID LIVER 272.4 V58.69 WEEK OF May 21 2010 3)  Your physician has recommended you  make the following change in your medication: INCREASE CRESTOR TO 40 MG EVERY DAY Prescriptions: NITROSTAT 0.4 MG SUBL (NITROGLYCERIN) 1 tablet under tongue at onset of chest pain; you may repeat every 5 minutes for up to 3 doses.  #25 x 9   Entered by:   Danielle Rankin, CMA   Authorized by:   Gaylord Shih, MD, [facility]   Signed by:   Danielle Rankin, CMA on 04/09/2010   Method used:   Electronically to        [facility] [address] (retail)       [phone]       [place], [place]  [address]       Ph: [phone]       Fax: [phone]   RxID:   [id number]

## 2011-01-06 NOTE — Progress Notes (Signed)
Summary: FYI  Phone Note Call from Patient Call back at Home Phone [phone]   Caller: Patient Call For: parrett Summary of Call: FYI: Pt states she is cancelling her appt with TP today  to discuss levimir, says she is not going to use this new insulin, she is going to continue using lantus, and she will discuss this with SN when she sees him on 9/20. Initial call taken by: Darletta Moll,  May 14, 2010 9:16 AM  Follow-up for Phone Call        Spoke with pt; she has another appt today and can not get here; states that she will continue her Lantus-I discussed the risks of not having tight control of blood sugars with pt and pt decided to come in and see TP on Thursday at 1115am; start Levimir insulin.Reynaldo Minium CMA  May 14, 2010 9:24 AM

## 2011-01-06 NOTE — Assessment & Plan Note (Signed)
Summary: Z6X    Visit Type:  6  MO F/U Primary Provider:  Alroy Dust MD  CC:  pt states she fell Sunday on her left hip where she has a total hip replacement states she went to go see Dr. Charlann Boxer said told everything was fine....denies any cardiac complaints today.  History of Present Illness: Ms Gutierres returns today for evaluation of history of coronary artery disease and PCI.  She's had no symptoms of angina or ischemia. She is somewhat limited in mobility since she fractured her hip in the past. Unfortunately, she tripped on a cord earlier this week and fell. She was evaluated by orthopedic surgery and found to have no fracture. Her knee is bruised.  Laboratory data including lipids are followed Dr. Lennon Alstrom. Her hemoglobin A1c has been rather high at 9.1%. LDL was 104 in March of this year. HDL is 50.3. She remains on Crestor 20 area LFTs were normal.reviewed with patient.  She's having no edema, orthopnea, or PND.  She's had no symptoms of TIAs or mini strokes. Carotid Dopplers obtained in May of this year were stable. Reviewed with patient.  Her last stress Myoview was in April 2010. No ischemia or scar. Reviewed with patient.  Current Medications (verified): 1)  Adult Aspirin Ec Low Strength 81 Mg  Tbec (Aspirin) .... Take 1 Tablet By Mouth Once A Day 2)  Nitrostat 0.4 Mg Subl (Nitroglycerin) .Marland Kitchen.. 1 Tablet Under Tongue At Onset of Chest Pain; You May Repeat Every 5 Minutes For Up To 3 Doses. 3)  Atenolol 25 Mg  Tabs (Atenolol) .... Take 1 Tablet By Mouth Once A Day 4)  Felodipine 10 Mg  Tb24 (Felodipine) .... Take 1 Tablet By Mouth Once A Day 5)  Quinapril Hcl 40 Mg  Tabs (Quinapril Hcl) .... Take 1 Tablet By Mouth Once A Day 6)  Furosemide 20 Mg  Tabs (Furosemide) .... Take 1 Tablet By Mouth Once A Day 7)  Crestor 20 Mg Tabs (Rosuvastatin Calcium) .Marland Kitchen.. 1 By Mouth Daily 8)  Co-Q 10 Omega-3 Fish Oil  Caps (Coenzyme Q10-Fish Oil-Vit E) .... Take One By Mouth Once Daily 9)  Lantus 100  Unit/ml Soln (Insulin Glargine) .... 40 Untis Subcutaneously Daily 10)  Humalog 100 Unit/ml Soln (Insulin Lispro (Human)) .... Inject 5-15 Units Subcutaneously As Needed Before Meals 11)  Synthroid 75 Mcg Tabs (Levothyroxine Sodium) .Marland Kitchen.. 1 Tab By Mouth Once Daily 12)  Omeprazole 20 Mg Cpdr (Omeprazole) .Marland Kitchen.. 1 By Mouth Two Times A Day 13)  Caltrate 600+d 600-400 Mg-Unit  Tabs (Calcium Carbonate-Vitamin D) .... Take 1 Tablet By Mouth Once A Day 14)  Multivitamins   Tabs (Multiple Vitamin) .... Take 1 Tablet By Mouth Once A Day 15)  Vitamin D 1000 Unit Tabs (Cholecalciferol) .... Take One By Mouth Once Daily 16)  Amitriptyline Hcl 10 Mg Tabs (Amitriptyline Hcl) .... Take 1 Tab By Mouth At Bedtime As Directed... 17)  Tylenol Extra Strength 500 Mg Tabs (Acetaminophen) .... As Needed 18)  Tramadol Hcl 50 Mg Tabs (Tramadol Hcl) .... Take 1 Tab By Mouth Up To 4 Times Daily As Needed For Pain...  Allergies: 1)  ! Motrin  Past History:  Past Medical History: Last updated: 08/26/2010 RETINOPATHY (ICD-363.20) HYPERTENSION (ICD-401.9) CAD (ICD-414.00) CHF (ICD-428.0) CEREBROVASCULAR DISEASE (ICD-437.9) PERIPHERAL VASCULAR DISEASE (ICD-443.9) HYPERCHOLESTEROLEMIA (ICD-272.0) DIABETES MELLITUS (ICD-250.00) HYPOTHYROIDISM (ICD-244.9) HIATAL HERNIA (ICD-553.3) DEGENERATIVE JOINT DISEASE (ICD-715.90) OSTEOPOROSIS (ICD-733.00) VITAMIN D DEFICIENCY (ICD-268.9) ANEMIA OF CHRONIC DISEASE (ICD-285.29)  Past Surgical History: Last updated: 08/26/2010 Abdominal aortogram.  07/27/2003 by Veneda Melter M.D. Bilateral lower extremity angiogram. 07/27/2003 by Veneda Melter M.D. Selective angiography, right superficial femoral artery, right and left iliac arteries. 07/27/2003 by Dr. Chales Abrahams Measurement of gradient right and left iliac arteries. 07/27/2003 by Veneda Melter M.D.  S/P bilat cataract surgery in 2000 & 2002 by DrJacklin S/P repair left hip fracture 5/09 by DrOlin  Family History: Last updated:  2010-09-08 mother died age 15 from cancer and heart problems father died age 85 from cancer 1 brother alive age 65  Social History: Last updated: 09-08-10 retired married 2 children non smoker no alcohol  Risk Factors: Smoking Status: quit > 6 months (09/08/10)  Review of Systems       negative other than history of present illness.  Vital Signs:  Patient profile:   76 year old female Height:      63 inches Weight:      146.8 pounds BMI:     26.10 Pulse rate:   64 / minute Pulse rhythm:   regular BP sitting:   148 / 72  (left arm) Cuff size:   large  Vitals Entered By: Danielle Rankin, CMA (October 16, 2010 9:25 AM)  Physical Exam  General:  well-developed overweight no acute distress. Very pleasant Head:  normocephalic and atraumatic Eyes:  right eye blindness Neck:  Neck supple, no JVD. No masses, thyromegaly or abnormal cervical nodes. Chest Hernando Reali:  no deformities or breast masses noted Lungs:  Clear bilaterally to auscultation and percussion. Heart:  PMI nondisplaced, regular rate and rhythm, normal S1-S2, no gallop, bilateral carotid bruits Msk:  decreased ROM.  left wrist knee without significant effusion Pulses:  dorsalis pedis 2+ over 4+, posterior tibial trace bilaterally Extremities:  no edema today. Neurologic:  Alert and oriented x 3. Skin:  Intact without lesions or rashes. Psych:  depressed affect.     Impression & Recommendations:  Problem # 1:  CAD (ICD-414.00) Assessment Unchanged continue medical therapy Her updated medication list for this problem includes:    Adult Aspirin Ec Low Strength 81 Mg Tbec (Aspirin) .Marland Kitchen... Take 1 tablet by mouth once a day    Nitrostat 0.4 Mg Subl (Nitroglycerin) .Marland Kitchen... 1 tablet under tongue at onset of chest pain; you may repeat every 5 minutes for up to 3 doses.    Atenolol 25 Mg Tabs (Atenolol) .Marland Kitchen... Take 1 tablet by mouth once a day    Felodipine 10 Mg Tb24 (Felodipine) .Marland Kitchen... Take 1 tablet by mouth once a  day    Quinapril Hcl 40 Mg Tabs (Quinapril hcl) .Marland Kitchen... Take 1 tablet by mouth once a day  Problem # 2:  CHF (ICD-428.0) Assessment: Improved no edema today, change in treatment. Her updated medication list for this problem includes:    Adult Aspirin Ec Low Strength 81 Mg Tbec (Aspirin) .Marland Kitchen... Take 1 tablet by mouth once a day    Nitrostat 0.4 Mg Subl (Nitroglycerin) .Marland Kitchen... 1 tablet under tongue at onset of chest pain; you may repeat every 5 minutes for up to 3 doses.    Atenolol 25 Mg Tabs (Atenolol) .Marland Kitchen... Take 1 tablet by mouth once a day    Felodipine 10 Mg Tb24 (Felodipine) .Marland Kitchen... Take 1 tablet by mouth once a day    Quinapril Hcl 40 Mg Tabs (Quinapril hcl) .Marland Kitchen... Take 1 tablet by mouth once a day    Furosemide 20 Mg Tabs (Furosemide) .Marland Kitchen... Take 1 tablet by mouth once a day  Problem # 3:  HYPERTENSION (ICD-401.9) Assessment: Unchanged  Her  updated medication list for this problem includes:    Adult Aspirin Ec Low Strength 81 Mg Tbec (Aspirin) .Marland Kitchen... Take 1 tablet by mouth once a day    Atenolol 25 Mg Tabs (Atenolol) .Marland Kitchen... Take 1 tablet by mouth once a day    Felodipine 10 Mg Tb24 (Felodipine) .Marland Kitchen... Take 1 tablet by mouth once a day    Quinapril Hcl 40 Mg Tabs (Quinapril hcl) .Marland Kitchen... Take 1 tablet by mouth once a day    Furosemide 20 Mg Tabs (Furosemide) .Marland Kitchen... Take 1 tablet by mouth once a day  Problem # 4:  CEREBROVASCULAR DISEASE (ICD-437.9) Assessment: Unchanged  Problem # 5:  PERIPHERAL VASCULAR DISEASE (ICD-443.9) Assessment: Unchanged  Problem # 6:  HYPERCHOLESTEROLEMIA (ICD-272.0) she is not at goal. We'll increase her Crestor 40 mg p.o. q. day with hopes of getting LDL levels as close to 70 as possible.. Follow blood work in 6 weeks. Her updated medication list for this problem includes:    Crestor 20 Mg Tabs (Rosuvastatin calcium) .Marland Kitchen... 1 by mouth daily  Problem # 7:  DIABETES MELLITUS (ICD-250.00) Assessment: Deteriorated reviewed hemoglobin A1c and the importance of getting  this down to avoid vascular complications. Follow up with primary care. Her updated medication list for this problem includes:    Adult Aspirin Ec Low Strength 81 Mg Tbec (Aspirin) .Marland Kitchen... Take 1 tablet by mouth once a day    Quinapril Hcl 40 Mg Tabs (Quinapril hcl) .Marland Kitchen... Take 1 tablet by mouth once a day    Lantus 100 Unit/ml Soln (Insulin glargine) .Marland KitchenMarland KitchenMarland KitchenMarland Kitchen 40 untis subcutaneously daily    Humalog 100 Unit/ml Soln (Insulin lispro (human)) ..... Inject 5-15 units subcutaneously as needed before meals  Clinical Reports Reviewed:  Cardiac Cath:  03/02/2007: Cardiac Cath Findings:  IMPRESSION/RECOMMENDATION:  No significant iliac disease to explain her  hip and thigh discomfort.  I therefore think this is not related to her  vascular disease.  Will leave further evaluation to Dr. Kriste Basque.   Salvadore Farber, MD  Electronically Signed   WED/MEDQ  D:  03/02/2007  T:  03/02/2007  Job:  [id number]  05/11/2000: Cardiac Cath Findings:  CONCLUSIONS: 1. Preserved overall left ventricular function. 2. No evidence of high-grade re-stenosis at the stent site. 3. Scattered coronary lesions as described above.  RECOMMENDATIONS: 1. Continuation of medical therapy. 2. Persantine Cardiolite to exclude areas of ischemia.  DISCUSSION:  While the LAD does not appear to be tight, there could be luminal disease based on the so called normal vessel size.  However, at the present time I would not be inclined to do any kind of intervention on the above findings. DD:  05/11/00 TD:  05/13/00 Job: [id number]  Carotid Doppler:  04/09/2010:  Impressions: Stable, mild carotid artery disease, bilaterally. 40-59% bilateral ICA stenosis.  Charlton Haws, MD  03/13/2008:  Impressions: Bilateral carotid artery disease that has been stable over serial exams.  Elevated right subclavian artery velocities. 40-59% bilateral ICA stenoses.  04/06/2007:  Impressions: Stable bilateral carotid artery  disease. 40-59% bilateral ICA stenosis.  Nuclear Study:  03/19/2009:  Excerise capacity: Adenosine study with no exercise  ECG impression: No significant ST segment change suggestive of ischemia  Overall impression: There is no sign of scar or ischemia.  Olga Millers, MD, [facility]   08/26/2007:    Impression:  EF: 76%  Exercise Capacity: Adenosine study with no exercise. Blood Pressure Response: Normal blood pressure response. Clinical Symptoms: There is dyspnea. ECG Impression: No significant ST segment change  suggestive of ischemia.  Overall Impression: Normal stress nuclear study.  06/25/2004:  Impression : Normal adenosine Cardiolite study. Ejection fraction was 78%.   Patient Instructions: 1)  Your physician recommends that you schedule a follow-up appointment in: April 2012 with Dr. Daleen Squibb 2)  Your physician recommends that you return for a FASTING lipid profile,liver at the [place] office December 09, 2010 3)  Your physician has recommended you make the following change in your medication: increase crestor to 40mg  daily at bedtime.

## 2011-01-06 NOTE — Assessment & Plan Note (Signed)
Summary: VOID VISIT - NOT SEEN --SAMPLE PICK UP    Primary Provider/Referring Provider:  Alroy Dust MD   History of Present Illness:  VOID VISIT. ERROR PT PICKING UP SAMPLES/RX -NOT TO BE SEEN. PER .dR. NADEL.   Medications Prior to Update: 1)  Adult Aspirin Ec Low Strength 81 Mg  Tbec (Aspirin) .... Take 1 Tablet By Mouth Once A Day 2)  Nitrostat 0.4 Mg Subl (Nitroglycerin) .Marland Kitchen.. 1 Tablet Under Tongue At Onset of Chest Pain; You May Repeat Every 5 Minutes For Up To 3 Doses. 3)  Atenolol 25 Mg  Tabs (Atenolol) .... Take 1 Tablet By Mouth Once A Day 4)  Felodipine 10 Mg  Tb24 (Felodipine) .... Take 1 Tablet By Mouth Once A Day 5)  Quinapril Hcl 40 Mg  Tabs (Quinapril Hcl) .... Take 1 Tablet By Mouth Once A Day 6)  Furosemide 20 Mg  Tabs (Furosemide) .... Take 1 Tablet By Mouth Once A Day 7)  Crestor 20 Mg Tabs (Rosuvastatin Calcium) .Marland Kitchen.. 1 By Mouth Daily 8)  Co-Q 10 Omega-3 Fish Oil  Caps (Coenzyme Q10-Fish Oil-Vit E) .... Take One By Mouth Once Daily 9)  Lantus 100 Unit/ml Soln (Insulin Glargine) .... Inject 40 U Daily... 10)  Humalog 100 Unit/ml Soln (Insulin Lispro (Human)) .... Inject 5-15 Units Subcutaneously As Needed Before Meals 11)  Synthroid 75 Mcg Tabs (Levothyroxine Sodium) .Marland Kitchen.. 1 Tab By Mouth Once Daily 12)  Omeprazole 20 Mg Cpdr (Omeprazole) .Marland Kitchen.. 1 By Mouth Two Times A Day 13)  Caltrate 600+d 600-400 Mg-Unit  Tabs (Calcium Carbonate-Vitamin D) .... Take 1 Tablet By Mouth Once A Day 14)  Multivitamins   Tabs (Multiple Vitamin) .... Take 1 Tablet By Mouth Once A Day 15)  Vitamin D 1000 Unit Tabs (Cholecalciferol) .... Take One By Mouth Once Daily 16)  Tylenol Extra Strength 500 Mg Tabs (Acetaminophen) .... As Needed 17)  Amitriptyline Hcl 10 Mg Tabs (Amitriptyline Hcl) .... Take 1 Tab By Mouth At Bedtime As Directed...  Current Medications (verified): 1)  Adult Aspirin Ec Low Strength 81 Mg  Tbec (Aspirin) .... Take 1 Tablet By Mouth Once A Day 2)  Nitrostat 0.4 Mg Subl  (Nitroglycerin) .Marland Kitchen.. 1 Tablet Under Tongue At Onset of Chest Pain; You May Repeat Every 5 Minutes For Up To 3 Doses. 3)  Atenolol 25 Mg  Tabs (Atenolol) .... Take 1 Tablet By Mouth Once A Day 4)  Felodipine 10 Mg  Tb24 (Felodipine) .... Take 1 Tablet By Mouth Once A Day 5)  Quinapril Hcl 40 Mg  Tabs (Quinapril Hcl) .... Take 1 Tablet By Mouth Once A Day 6)  Furosemide 20 Mg  Tabs (Furosemide) .... Take 1 Tablet By Mouth Once A Day 7)  Crestor 20 Mg Tabs (Rosuvastatin Calcium) .Marland Kitchen.. 1 By Mouth Daily 8)  Co-Q 10 Omega-3 Fish Oil  Caps (Coenzyme Q10-Fish Oil-Vit E) .... Take One By Mouth Once Daily 9)  Lantus 100 Unit/ml Soln (Insulin Glargine) .... Inject 40 U Daily... 10)  Humalog 100 Unit/ml Soln (Insulin Lispro (Human)) .... Inject 5-15 Units Subcutaneously As Needed Before Meals 11)  Synthroid 75 Mcg Tabs (Levothyroxine Sodium) .Marland Kitchen.. 1 Tab By Mouth Once Daily 12)  Omeprazole 20 Mg Cpdr (Omeprazole) .Marland Kitchen.. 1 By Mouth Two Times A Day 13)  Caltrate 600+d 600-400 Mg-Unit  Tabs (Calcium Carbonate-Vitamin D) .... Take 1 Tablet By Mouth Once A Day 14)  Multivitamins   Tabs (Multiple Vitamin) .... Take 1 Tablet By Mouth Once A Day 15)  Vitamin  D 1000 Unit Tabs (Cholecalciferol) .... Take One By Mouth Once Daily 16)  Tylenol Extra Strength 500 Mg Tabs (Acetaminophen) .... As Needed 17)  Amitriptyline Hcl 10 Mg Tabs (Amitriptyline Hcl) .... Take 1 Tab By Mouth At Bedtime As Directed...  Allergies (verified): 1)  ! Motrin  Past History:  Family History: Last updated: 2008-05-26 mother died age 81 from cancer and heart problems father died age 64 from cancer 1 brother alive age 37  Social History: Last updated: 26-May-2008 retired married 2 children non smoker no etoh  Risk Factors: Smoking Status: quit > 6 months (04/23/2009)  Vital Signs:  Patient profile:   76 year old female Height:      63 inches Weight:      141 pounds BMI:     25.07 O2 Sat:      100 % on Room air Temp:     97.5  degrees F oral Pulse rate:   53 / minute BP sitting:   108 / 70  (left arm) Cuff size:   regular  Vitals Entered By: Boone Master CNA/MA (May 15, 2010 11:26 AM)  O2 Flow:  Room air Is Patient Diabetic? Yes Comments Medications reviewed with patient Daytime contact number verified with patient. Boone Master CNA/MA  May 15, 2010 11:27 AM     Medications Added to Medication List This Visit: 1)  Levemir Flexpen 100 Unit/ml Soln (Insulin detemir) .... 40 units subcutaneously once daily Prescriptions: LEVEMIR FLEXPEN 100 UNIT/ML SOLN (INSULIN DETEMIR) 40 units subcutaneously once daily  #1 x 5   Entered and Authorized by:   Rubye Oaks NP   Signed by:   Chiamaka Latka NP on 05/15/2010   Method used:   Print then Give to Patient   RxID:   [id number]     Appended Document: VOID VISIT - NOT SEEN --SAMPLE PICK UP    Appended Document: levemir rx to [facility] Medications Added LEVEMIR 100 UNIT/ML SOLN (INSULIN DETEMIR) 40 units subcutaneously daily       called spoke with patient, apologized for the miscommunication last week.  offered pt the prescription for levemir, but pt states she does not want the flexpen, but the vial.  rx changed to levemir 100 unit/soln 40units daily.  pt requests a 90day supply supply be sent to [facility].  rx sent electronically. Boone Master CNA/MA  May 20, 2010 10:11 AM   Clinical Lists Changes  Medications: Changed medication from LEVEMIR FLEXPEN 100 UNIT/ML SOLN (INSULIN DETEMIR) 40 units subcutaneously once daily to LEVEMIR 100 UNIT/ML SOLN (INSULIN DETEMIR) 40 units subcutaneously daily - Signed Rx of LEVEMIR 100 UNIT/ML SOLN (INSULIN DETEMIR) 40 units subcutaneously daily;  #90days x 3;  Signed;  Entered by: Boone Master CNA/MA;  Authorized by: Michele Mcalpine MD;  Method used: Electronically to [facility] [place] , ,   , Ph: [phone], Fax: [phone]    Prescriptions: LEVEMIR 100 UNIT/ML SOLN (INSULIN DETEMIR) 40 units subcutaneously  daily  #90days x 3   Entered by:   Boone Master CNA/MA   Authorized by:   Michele Mcalpine MD   Signed by:   Boone Master CNA/MA on 05/20/2010   Method used:   Electronically to        [facility] (retail)             ,          Ph: [phone]       Fax: [phone]   RxID:   [id number]

## 2011-01-08 NOTE — Progress Notes (Signed)
Summary: pt calling about chelesterol and med changes   Phone Note Call from Patient Call back at Home Phone [phone]   Caller: Patient Reason for Call: Talk to Nurse, Talk to Doctor Summary of Call: pt rtn call about her cholesterol and changing her meds Initial call taken by: Omer Jack,  December 23, 2010 10:35 AM  Follow-up for Phone Call        Pt reports "hurting in stomach has gone away and legs are feeling much better".  She would also like to know if there is a generic form of Crestor or the equilvalent that she could take?  I told her there is not a generic Crestor as of yet but will forward to Dr. Daleen Squibb for recommendations. Mylo Red RN     Appended Document: pt calling about chelesterol and med changes WILL NOT TOLERAT GENERIC STATIN....HX OF MYALGIAS WITH HIGH DOSE CRESTOR.  Appended Document: pt calling about chelesterol and med changes Pt is aware of Dr. Vern Claude recommendations and agrees. Mylo Red RN

## 2011-01-08 NOTE — Progress Notes (Signed)
Summary: question on meds   Phone Note Call from Patient Call back at Home Phone [phone]   Caller: Patient Reason for Call: Talk to Nurse Summary of Call: Pt has question on cholesterol med  Initial call taken by: Roe Coombs,  November 26, 2010 10:06 AM  Follow-up for Phone Call        Pt will pick up samples of Crestor tomorrow and return to [facility] lab on 12/09/10.  She would like Dr. Daleen Squibb to possibly prescribe a generic brand as the Crestor is expensive.  I will forward this to Dr. Daleen Squibb for review. Mylo Red RN

## 2011-01-09 ENCOUNTER — Telehealth: Payer: Self-pay | Admitting: Pulmonary Disease

## 2011-01-14 NOTE — Progress Notes (Signed)
Summary: medication issue  Phone Note Call from Patient Call back at Home Phone [phone]   Caller: Patient Call For: young Summary of Call: Need to talk to [facility] about her lantus through [facility]. Initial call taken by: Darletta Moll,  January 06, 2011 11:31 AM  Follow-up for Phone Call        Spoke with pt.  She is requesting 90 day supply of lantus called to pharm.  This was done.  I advised needs to keep ov in March for additional refills.  Follow-up by: Vernie Murders,  January 06, 2011 1:12 PM    Prescriptions: LANTUS 100 UNIT/ML SOLN (INSULIN GLARGINE) 40 untis subcutaneously daily  #3 x 1   Entered by:   Vernie Murders   Authorized by:   Michele Mcalpine MD   Signed by:   Vernie Murders on 01/06/2011   Method used:   Faxed to ...       [facility] (mail-order)             , [place]         Ph: [phone]       Fax: [phone]   RxID:   [id number]

## 2011-01-22 NOTE — Progress Notes (Signed)
Summary: lantus  Phone Note Call from Patient Call back at Home Phone [phone]   Caller: Patient Call For: Nicole [name] Summary of Call: pt requests to speak to nurse re: rx for lantus. says it "wasn't written the way it normally has been written".  Initial call taken by: Tivis Ringer, CNA,  January 09, 2011 1:59 PM  Follow-up for Phone Call        ATCx2 NA, no voicemail. WCB. Carron Curie CMA  January 09, 2011 4:52 PM ATC x 3- NA and unable to leave a msg, WCB. Vernie Murders  January 12, 2011 8:46 AM  Spoke to pt ans she states in the past she was given 4 vials for a 90 day supply, but this time she got 3 vials. I called [facility] to advise give pt 90 day supply and I was told that she can only get 3 vials because each vial is 25 days of medication and 4 vials would equal 100 days and her plan will not cover this.  They state the pt canget a refill every 75 days. I called the pt and advised her of this. Carron Curie CMA  January 12, 2011 5:23 PM

## 2011-02-05 ENCOUNTER — Encounter: Payer: Self-pay | Admitting: Pulmonary Disease

## 2011-02-24 ENCOUNTER — Encounter: Payer: Self-pay | Admitting: Pulmonary Disease

## 2011-02-24 ENCOUNTER — Ambulatory Visit (INDEPENDENT_AMBULATORY_CARE_PROVIDER_SITE_OTHER): Payer: Medicare Other | Admitting: Pulmonary Disease

## 2011-02-24 ENCOUNTER — Other Ambulatory Visit (INDEPENDENT_AMBULATORY_CARE_PROVIDER_SITE_OTHER): Payer: Medicare Other

## 2011-02-24 VITALS — BP 126/60 | HR 54 | Temp 97.6°F | Ht 64.0 in | Wt 149.0 lb

## 2011-02-24 DIAGNOSIS — E119 Type 2 diabetes mellitus without complications: Secondary | ICD-10-CM

## 2011-02-24 DIAGNOSIS — I679 Cerebrovascular disease, unspecified: Secondary | ICD-10-CM

## 2011-02-24 DIAGNOSIS — D638 Anemia in other chronic diseases classified elsewhere: Secondary | ICD-10-CM

## 2011-02-24 DIAGNOSIS — I10 Essential (primary) hypertension: Secondary | ICD-10-CM

## 2011-02-24 DIAGNOSIS — M810 Age-related osteoporosis without current pathological fracture: Secondary | ICD-10-CM

## 2011-02-24 DIAGNOSIS — E039 Hypothyroidism, unspecified: Secondary | ICD-10-CM

## 2011-02-24 DIAGNOSIS — I2510 Atherosclerotic heart disease of native coronary artery without angina pectoris: Secondary | ICD-10-CM

## 2011-02-24 DIAGNOSIS — M1990 Unspecified osteoarthritis, unspecified site: Secondary | ICD-10-CM

## 2011-02-24 DIAGNOSIS — E7800 Pure hypercholesterolemia, unspecified: Secondary | ICD-10-CM

## 2011-02-24 LAB — CBC WITH DIFFERENTIAL/PLATELET
Eosinophils Absolute: 0.1 10*3/uL (ref 0.0–0.7)
Eosinophils Relative: 2.2 % (ref 0.0–5.0)
HCT: 31.5 % — ABNORMAL LOW (ref 36.0–46.0)
Lymphs Abs: 1.5 10*3/uL (ref 0.7–4.0)
MCHC: 34.5 g/dL (ref 30.0–36.0)
MCV: 93.3 fl (ref 78.0–100.0)
Monocytes Absolute: 0.7 10*3/uL (ref 0.1–1.0)
Neutrophils Relative %: 62 % (ref 43.0–77.0)
Platelets: 228 10*3/uL (ref 150.0–400.0)
RDW: 13.4 % (ref 11.5–14.6)
WBC: 6.1 10*3/uL (ref 4.5–10.5)

## 2011-02-24 LAB — BASIC METABOLIC PANEL
Calcium: 9.2 mg/dL (ref 8.4–10.5)
GFR: 57.62 mL/min — ABNORMAL LOW (ref >60.00)
Glucose, Bld: 63 mg/dL — ABNORMAL LOW (ref 70–99)
Potassium: 4.2 mEq/L (ref 3.5–5.1)
Sodium: 142 mEq/L (ref 135–145)

## 2011-02-24 LAB — HEMOGLOBIN A1C: Hgb A1c MFr Bld: 9.4 % — ABNORMAL HIGH (ref 4.6–6.5)

## 2011-02-24 MED ORDER — METHOCARBAMOL 500 MG PO TABS: 500.0000 mg | ORAL_TABLET | Freq: Three times a day (TID) | ORAL | Status: DC | PRN

## 2011-02-24 MED ORDER — PREDNISONE (PAK) 5 MG PO TABS: ORAL_TABLET | ORAL | Status: DC

## 2011-02-24 MED ORDER — INSULIN LISPRO 100 UNIT/ML ~~LOC~~ SOLN: SUBCUTANEOUS | Status: DC

## 2011-02-24 NOTE — Assessment & Plan Note (Signed)
This was her CC today> neck pain present x 3months... She has prev seen [facility] & prefers to ret there for f/u XRays & further eval... She will set up her own appt... We discussed Rx in the interim & hopefully she will respond to PredDosepak,  + rest/ heat/ Robaxin/ Tramadol+Tylenol.Marland KitchenMarland Kitchen

## 2011-02-24 NOTE — Assessment & Plan Note (Signed)
FLP from 1/12 reviewed w/ pt>  LDL is 99 on Crestor20 and being a diabetic w/ suspected CAD her goal LDL is <70... DrWall wanted to incr the Crestor to 40mg /d but she declined... I underscored the goal values and encouraged her to reconsider the Cres40 but she is undeterred & wants to continue the 20mg /d & will try harder on diet (exerc is difficult w/ her arthritis etc..Marland Kitchen

## 2011-02-24 NOTE — Assessment & Plan Note (Signed)
She understands how important it is to keep BP under tight control w/ her DM etc... Continue same 4 meds 7 take them regularly... She needs better home BP monitoring.Marland KitchenMarland Kitchen

## 2011-02-24 NOTE — Progress Notes (Signed)
Subjective:    Patient ID: Nicole [name], female    DOB: May 17, 1935, 76 y.o.   MRN: [id number]  HPI 76 y/o WF here for a follow up visit... she has mult med problems including IDDM w/ hx poor control;  HBP;  CAD followed by DrWall;  Cerebrovasc & Peripheral vasc dis;  Hyperlipidemia;  Hypothyroidism;  Large HH;  DJD w/ neck & LBP;  Osteoporosis & Vit D defic;  Hx Anemia...  ~  February 24, 2011:  6 month ROV & c/o left sided neck pain x 63mo started while making fudge in Dec> c/o tight leaders, popping & cracking, HA, etc; radiating to shoulder & up to head, no weakness/ numbness, etc;  She has been evaluated by DrBrooks, [facility], in the past w/ prev XRays she says (he wants to give shots, she declined- we don't have records);  She can't get MRIs due to screws in left hip;  We discussed rx w/ Pred Dosepak, Tramadol/ Tylenol. Add Robaxin 500mg Tid, rest/ heat, & she will call DrBrooks for f/u...    Ortho followed by DrOlin/ Ramos at [facility] for prev left fem neck fracture 5/09 w/ closed reduction & percut screw fixation... severe LBP & pain all over- given epid steroid shots w/ min relief; pain Rx w/ DCN in the past was too strong (sedated) therefore try Tramadol...     Cards followed by DrWall- HBP, diffuse vascular dis, r/o CAD, hyperlipidemia> stable w/ severe disease on Atenolol, Quinapril, Felodipine, Lasix;  BP today= 126/60 & denies CP, palpit, change in dyspnea, edema, etc... She is way too sedentary because of her Ortho problems... She last saw DrWall 11/11 & his note is reviewed- stable, no changes made...     Hyperchol>  She had FLP done by [facility] 1/12 on Crestor20mg /d w/ TChol 166, TG 122, HDL 43, LDL 99> he wanted to incr the Crestor to 40mg  but pt declined 7 preferred to keep it at 20mg /d & work on diet & wt reduction...     Her main problem is brittle DM> difficult contol on Lantus/ Humalog w/ freq adjustments & tried on Levemir but intol ("the shots burned & made me weak")... she  refused Endocrine referral... she has DM retinopathy w/ macular edema followed in the [facility] clinic... We have been trying to get her to slowly increase her Lantus by 2u increments titrating it to her FBS;  But she has refused, thwarted our attempts at better control by always returning her Lantus to 40u daily;  We discussed this strategy again today...   Review of Systems   Constitutional:  Denies F/C/S, anorexia, unexpected weight change. HEENT:  Occas HAs; but no visual changes, earache, nasal symptoms, sore throat, hoarseness. Resp:  No cough, sputum, hemoptysis; no ch DOE, & no tightness or wheezing. Cardio:  No CP, palpit, orthopnea, edema. GI:  Denies N/V/D/C or blood in stool; no reflux, abd pain, distention, or gas. GU:  No dysuria, freq, urgency, hematuria, or flank pain. MS:  Mod joint pains, w/ mild swelling, tenderness, & decr ROM... Neuro:  No tremors, seizures, dizziness, syncope; sl weakness & multifactorial gait abn. Skin:  No suspicious lesions or skin rash. Heme:  No adenopathy, bruising, bleeding. Psyche: Denies confusion, sleep disturbance, hallucinations; mod anxiety caring for husb (Alz).   Objective:   Physical Exam   WD, WN, 76 y/o WF in NAD; she is chr ill appearing... Vital Signs:  Reviewed...BP= 126/60 General:  Alert & oriented; pleasant & cooperative... HEENT:  [place]/AT,  EOM-wnl, PERRLA, EACs-clear, TMs-wnl, NOSE-clear, THROAT-clear & wnl. Neck:  Sl tender w/ decr ROM; no JVD; normal carotid impulses w/o bruits; no thyromegaly or nodules palpated; no lymphadenopathy. Chest:  Clear to P & A; without wheezes/ rales/ or rhonchi heard... Heart:  Regular Rhythm; norm S1 & S2, & gr 1/6 SEM w/o rubs or gallops detected... Abdomen:  Soft & nontender; normal bowel sounds; no organomegaly or masses palpated... Ext:  Decr ROM; +heberden's etc & mod arthritic changes; no varicose veins, +venous insuffic, tr edema;  Pulses intact w/o bruits... Neuro:  CNs II-XII intact; motor  testing normal; sensory testing normal; abn gait & balance only fair... Derm:  No lesions noted; no rash etc... Lymph:  No cervical, supraclavicular, axillary, or inguinal adenopathy palpated...    Assessment & Plan:

## 2011-02-24 NOTE — Assessment & Plan Note (Signed)
She has a coronary equivalent w/ her severe DM & known periph vasc dis... She last saw Cards 11/11 & their note is reviewed w/ the pt> no changes made, continue Rx.Marland KitchenMarland Kitchen

## 2011-02-24 NOTE — Assessment & Plan Note (Addendum)
Severe IDDM as noted and she just doesn't "get it" when it comes to treating her sugars & adjusting her insulin... We (again) reviewed rec to incr the Lantus incrementally until the FBS is <150 or so... I (again) offered to refer to Endocrine for their help but she declines and says she will do better w/ diet & slowly incr the insulin as discussed... Encouraged her to call for questions & review her results...     We checked labs today (fasting) >> BS=63, A1c=9.4 >> therefore needs to spend more time fine tuning her Humalog doses & not the Lantus.Marland KitchenMarland Kitchen

## 2011-02-24 NOTE — Assessment & Plan Note (Signed)
She remains on ASA daily & denies cerebral ischemic symptoms... Continue same Rx & we reviewed risk factor  Reduction strategy.Marland KitchenMarland Kitchen

## 2011-02-24 NOTE — Patient Instructions (Signed)
We update your med list today & refilled the Humalog insulin as you requested... For your neck pain:  Apply heat to your neck...  Start the ROBAXIN 500mg  three times daily for muscle spasm... Take the PREDNISONE DOSEPAK over the next 6days in the tapering sched as indicated on the pack (this is to reduce the inflammation)...  You may use the TRAMADOL + TYLENOL together every 6H as needed for pain... Please remember to call DrBrooks office for a follow up eval & XRay of your neck... Let me know if we can be of further assistance... Today we did your f/u FASTING blood work... Call the "phone tree" in a few days for your results... AS WE DISCUSSED> slowly increase the LANTUS dose until the Fasting blood sugar is less than 150!!! Restart your Fish Oil & CoQ10... Let's plan a follow up appt in 4-6 months.Marland KitchenMarland Kitchen

## 2011-03-02 ENCOUNTER — Telehealth: Payer: Self-pay | Admitting: Pulmonary Disease

## 2011-03-02 NOTE — Telephone Encounter (Signed)
Called and spoke with [facility] and they state thye did received rx for pt and pt should receive it by 03/03/11. Pt is aware of this as well  Carver Fila, CMA

## 2011-03-03 ENCOUNTER — Other Ambulatory Visit: Payer: Self-pay | Admitting: Pulmonary Disease

## 2011-03-03 ENCOUNTER — Other Ambulatory Visit: Payer: Self-pay | Admitting: Cardiology

## 2011-03-05 NOTE — Telephone Encounter (Signed)
[address] °

## 2011-03-06 ENCOUNTER — Other Ambulatory Visit: Payer: Self-pay | Admitting: *Deleted

## 2011-03-06 MED ORDER — ATENOLOL 25 MG PO TABS: 25.0000 mg | ORAL_TABLET | Freq: Every day | ORAL | Status: DC

## 2011-03-19 ENCOUNTER — Inpatient Hospital Stay (HOSPITAL_COMMUNITY)
Admission: EM | Admit: 2011-03-19 | Discharge: 2011-03-23 | DRG: 286 | Disposition: A | Discharge status: Home Health | Payer: Medicare Other | Attending: Emergency Medicine | Admitting: Emergency Medicine

## 2011-03-19 ENCOUNTER — Emergency Department (HOSPITAL_COMMUNITY): Payer: Medicare Other

## 2011-03-19 DIAGNOSIS — I498 Other specified cardiac arrhythmias: Secondary | ICD-10-CM | POA: Diagnosis not present

## 2011-03-19 DIAGNOSIS — I509 Heart failure, unspecified: Secondary | ICD-10-CM | POA: Diagnosis present

## 2011-03-19 DIAGNOSIS — Z87891 Personal history of nicotine dependence: Secondary | ICD-10-CM

## 2011-03-19 DIAGNOSIS — M1990 Unspecified osteoarthritis, unspecified site: Secondary | ICD-10-CM | POA: Diagnosis present

## 2011-03-19 DIAGNOSIS — Z9861 Coronary angioplasty status: Secondary | ICD-10-CM

## 2011-03-19 DIAGNOSIS — E11319 Type 2 diabetes mellitus with unspecified diabetic retinopathy without macular edema: Secondary | ICD-10-CM | POA: Diagnosis present

## 2011-03-19 DIAGNOSIS — E7800 Pure hypercholesterolemia, unspecified: Secondary | ICD-10-CM | POA: Diagnosis present

## 2011-03-19 DIAGNOSIS — E1139 Type 2 diabetes mellitus with other diabetic ophthalmic complication: Secondary | ICD-10-CM | POA: Diagnosis present

## 2011-03-19 DIAGNOSIS — Z96649 Presence of unspecified artificial hip joint: Secondary | ICD-10-CM

## 2011-03-19 DIAGNOSIS — M810 Age-related osteoporosis without current pathological fracture: Secondary | ICD-10-CM | POA: Diagnosis present

## 2011-03-19 DIAGNOSIS — N183 Chronic kidney disease, stage 3 unspecified: Secondary | ICD-10-CM | POA: Diagnosis present

## 2011-03-19 DIAGNOSIS — I2510 Atherosclerotic heart disease of native coronary artery without angina pectoris: Principal | ICD-10-CM | POA: Diagnosis present

## 2011-03-19 DIAGNOSIS — Z794 Long term (current) use of insulin: Secondary | ICD-10-CM

## 2011-03-19 DIAGNOSIS — D638 Anemia in other chronic diseases classified elsewhere: Secondary | ICD-10-CM | POA: Diagnosis present

## 2011-03-19 DIAGNOSIS — I129 Hypertensive chronic kidney disease with stage 1 through stage 4 chronic kidney disease, or unspecified chronic kidney disease: Secondary | ICD-10-CM | POA: Diagnosis present

## 2011-03-19 DIAGNOSIS — K449 Diaphragmatic hernia without obstruction or gangrene: Secondary | ICD-10-CM | POA: Diagnosis present

## 2011-03-19 DIAGNOSIS — E039 Hypothyroidism, unspecified: Secondary | ICD-10-CM | POA: Diagnosis present

## 2011-03-19 DIAGNOSIS — I70209 Unspecified atherosclerosis of native arteries of extremities, unspecified extremity: Secondary | ICD-10-CM | POA: Diagnosis present

## 2011-03-19 DIAGNOSIS — I200 Unstable angina: Secondary | ICD-10-CM

## 2011-03-19 DIAGNOSIS — Z7982 Long term (current) use of aspirin: Secondary | ICD-10-CM

## 2011-03-19 DIAGNOSIS — I6529 Occlusion and stenosis of unspecified carotid artery: Secondary | ICD-10-CM | POA: Diagnosis present

## 2011-03-19 DIAGNOSIS — I5033 Acute on chronic diastolic (congestive) heart failure: Secondary | ICD-10-CM | POA: Diagnosis present

## 2011-03-19 DIAGNOSIS — E1169 Type 2 diabetes mellitus with other specified complication: Secondary | ICD-10-CM | POA: Diagnosis not present

## 2011-03-19 DIAGNOSIS — E785 Hyperlipidemia, unspecified: Secondary | ICD-10-CM | POA: Diagnosis present

## 2011-03-19 LAB — BASIC METABOLIC PANEL
Calcium: 8.4 mg/dL (ref 8.4–10.5)
Creatinine, Ser: 1.02 mg/dL (ref 0.4–1.2)
GFR calc Af Amer: >60 mL/min (ref >60)
GFR calc non Af Amer: 53 mL/min — ABNORMAL LOW (ref >60)

## 2011-03-19 LAB — DIFFERENTIAL
Lymphocytes Relative: 17 % (ref 12–46)
Lymphs Abs: 1.3 10*3/uL (ref 0.7–4.0)
Monocytes Absolute: 0.9 10*3/uL (ref 0.1–1.0)
Monocytes Relative: 12 % (ref 3–12)
Neutro Abs: 5.4 10*3/uL (ref 1.7–7.7)

## 2011-03-19 LAB — GLUCOSE, CAPILLARY
Glucose-Capillary: 76 mg/dL (ref 70–99)
Glucose-Capillary: 117 mg/dL — ABNORMAL HIGH (ref 70–99)
Glucose-Capillary: 152 mg/dL — ABNORMAL HIGH (ref 70–99)

## 2011-03-19 LAB — URINE MICROSCOPIC-ADD ON

## 2011-03-19 LAB — URINALYSIS, ROUTINE W REFLEX MICROSCOPIC
Glucose, UA: NEGATIVE mg/dL
Hgb urine dipstick: NEGATIVE
Ketones, ur: NEGATIVE mg/dL
Protein, ur: 100 mg/dL — AB
pH: 5 (ref 5.0–8.0)

## 2011-03-19 LAB — CBC
HCT: 29.6 % — ABNORMAL LOW (ref 36.0–46.0)
Hemoglobin: 9.7 g/dL — ABNORMAL LOW (ref 12.0–15.0)
MCH: 30 pg (ref 26.0–34.0)
MCHC: 32.8 g/dL (ref 30.0–36.0)
MCV: 91.6 fL (ref 78.0–100.0)

## 2011-03-19 LAB — CK TOTAL AND CKMB (NOT AT ARMC)
Relative Index: INVALID (ref 0.0–2.5)
Total CK: 78 U/L (ref 7–177)

## 2011-03-20 DIAGNOSIS — I059 Rheumatic mitral valve disease, unspecified: Secondary | ICD-10-CM

## 2011-03-20 DIAGNOSIS — I2510 Atherosclerotic heart disease of native coronary artery without angina pectoris: Secondary | ICD-10-CM

## 2011-03-20 LAB — GLUCOSE, CAPILLARY
Glucose-Capillary: 124 mg/dL — ABNORMAL HIGH (ref 70–99)
Glucose-Capillary: 130 mg/dL — ABNORMAL HIGH (ref 70–99)
Glucose-Capillary: 266 mg/dL — ABNORMAL HIGH (ref 70–99)

## 2011-03-20 LAB — CBC
HCT: 31.6 % — ABNORMAL LOW (ref 36.0–46.0)
Hemoglobin: 9.6 g/dL — ABNORMAL LOW (ref 12.0–15.0)
MCH: 29.4 pg (ref 26.0–34.0)
MCHC: 32.3 g/dL (ref 30.0–36.0)
MCHC: 32.6 g/dL (ref 30.0–36.0)
MCV: 91.1 fL (ref 78.0–100.0)
Platelets: 260 10*3/uL (ref 150–400)
RBC: 3.26 MIL/uL — ABNORMAL LOW (ref 3.87–5.11)
RDW: 13.5 % (ref 11.5–15.5)
WBC: 7 10*3/uL (ref 4.0–10.5)

## 2011-03-20 LAB — DIFFERENTIAL
Basophils Relative: 1 % (ref 0–1)
Eosinophils Absolute: 0.2 10*3/uL (ref 0.0–0.7)
Lymphs Abs: 2.1 10*3/uL (ref 0.7–4.0)
Monocytes Absolute: 0.8 10*3/uL (ref 0.1–1.0)
Monocytes Relative: 10 % (ref 3–12)
Neutro Abs: 4.9 10*3/uL (ref 1.7–7.7)

## 2011-03-20 LAB — RETICULOCYTES
RBC.: 3.48 MIL/uL — ABNORMAL LOW (ref 3.87–5.11)
Retic Count, Absolute: 90.5 10*3/uL (ref 19.0–186.0)

## 2011-03-20 LAB — COMPREHENSIVE METABOLIC PANEL
ALT: 14 U/L (ref 0–35)
AST: 21 U/L (ref 0–37)
Albumin: 3.2 g/dL — ABNORMAL LOW (ref 3.5–5.2)
Alkaline Phosphatase: 108 U/L (ref 39–117)
CO2: 24 mEq/L (ref 19–32)
Chloride: 106 mEq/L (ref 96–112)
Creatinine, Ser: 1.07 mg/dL (ref 0.4–1.2)
GFR calc Af Amer: >60 mL/min (ref >60)
GFR calc non Af Amer: 50 mL/min — ABNORMAL LOW (ref >60)
Potassium: 4.1 mEq/L (ref 3.5–5.1)
Total Bilirubin: 1.1 mg/dL (ref 0.3–1.2)

## 2011-03-20 LAB — IRON AND TIBC: Iron: 48 ug/dL (ref 42–135)

## 2011-03-20 LAB — PROTIME-INR: Prothrombin Time: 13.9 seconds (ref 11.6–15.2)

## 2011-03-20 LAB — POCT ACTIVATED CLOTTING TIME: Activated Clotting Time: 128 seconds

## 2011-03-20 LAB — BRAIN NATRIURETIC PEPTIDE: Pro B Natriuretic peptide (BNP): 282 pg/mL — ABNORMAL HIGH (ref 0.0–100.0)

## 2011-03-20 LAB — TSH: TSH: 3.702 u[IU]/mL (ref 0.350–4.500)

## 2011-03-21 LAB — CBC
HCT: 29.8 % — ABNORMAL LOW (ref 36.0–46.0)
Hemoglobin: 9.5 g/dL — ABNORMAL LOW (ref 12.0–15.0)
MCH: 29.1 pg (ref 26.0–34.0)
MCHC: 31.9 g/dL (ref 30.0–36.0)

## 2011-03-21 LAB — BASIC METABOLIC PANEL
CO2: 25 mEq/L (ref 19–32)
Calcium: 8.8 mg/dL (ref 8.4–10.5)
Chloride: 102 mEq/L (ref 96–112)
Creatinine, Ser: 1.14 mg/dL (ref 0.4–1.2)
Glucose, Bld: 315 mg/dL — ABNORMAL HIGH (ref 70–99)

## 2011-03-21 LAB — GLUCOSE, CAPILLARY
Glucose-Capillary: 377 mg/dL — ABNORMAL HIGH (ref 70–99)
Glucose-Capillary: 270 mg/dL — ABNORMAL HIGH (ref 70–99)
Glucose-Capillary: 70 mg/dL (ref 70–99)

## 2011-03-22 LAB — CBC
HCT: 29.3 % — ABNORMAL LOW (ref 36.0–46.0)
MCH: 30.2 pg (ref 26.0–34.0)
MCHC: 33.4 g/dL (ref 30.0–36.0)
RDW: 13.2 % (ref 11.5–15.5)

## 2011-03-22 LAB — GLUCOSE, CAPILLARY
Glucose-Capillary: 111 mg/dL — ABNORMAL HIGH (ref 70–99)
Glucose-Capillary: 317 mg/dL — ABNORMAL HIGH (ref 70–99)

## 2011-03-22 LAB — BASIC METABOLIC PANEL
CO2: 27 mEq/L (ref 19–32)
Calcium: 8.7 mg/dL (ref 8.4–10.5)
Creatinine, Ser: 1.27 mg/dL — ABNORMAL HIGH (ref 0.4–1.2)
GFR calc non Af Amer: 41 mL/min — ABNORMAL LOW (ref >60)
Glucose, Bld: 75 mg/dL (ref 70–99)
Sodium: 140 mEq/L (ref 135–145)

## 2011-03-22 LAB — HEMOCCULT GUIAC POC 1CARD (OFFICE): Fecal Occult Bld: NEGATIVE

## 2011-03-23 LAB — BASIC METABOLIC PANEL
Calcium: 8.4 mg/dL (ref 8.4–10.5)
GFR calc Af Amer: 55 mL/min — ABNORMAL LOW (ref >60)
GFR calc non Af Amer: 46 mL/min — ABNORMAL LOW (ref >60)
Potassium: 3.7 mEq/L (ref 3.5–5.1)
Sodium: 137 mEq/L (ref 135–145)

## 2011-03-23 LAB — CBC
MCHC: 32.9 g/dL (ref 30.0–36.0)
Platelets: 212 10*3/uL (ref 150–400)
RDW: 13.1 % (ref 11.5–15.5)
WBC: 5.8 10*3/uL (ref 4.0–10.5)

## 2011-03-23 LAB — GLUCOSE, CAPILLARY
Glucose-Capillary: 67 mg/dL — ABNORMAL LOW (ref 70–99)
Glucose-Capillary: 80 mg/dL (ref 70–99)

## 2011-03-25 ENCOUNTER — Encounter: Payer: Self-pay | Admitting: Cardiology

## 2011-03-25 ENCOUNTER — Ambulatory Visit (INDEPENDENT_AMBULATORY_CARE_PROVIDER_SITE_OTHER): Payer: Medicare Other | Admitting: Cardiology

## 2011-03-25 ENCOUNTER — Other Ambulatory Visit (INDEPENDENT_AMBULATORY_CARE_PROVIDER_SITE_OTHER): Payer: Medicare Other | Admitting: *Deleted

## 2011-03-25 VITALS — BP 160/50 | HR 72 | Resp 18 | Ht 64.0 in | Wt 147.0 lb

## 2011-03-25 DIAGNOSIS — I10 Essential (primary) hypertension: Secondary | ICD-10-CM

## 2011-03-25 DIAGNOSIS — D638 Anemia in other chronic diseases classified elsewhere: Secondary | ICD-10-CM

## 2011-03-25 DIAGNOSIS — D509 Iron deficiency anemia, unspecified: Secondary | ICD-10-CM

## 2011-03-25 DIAGNOSIS — R0989 Other specified symptoms and signs involving the circulatory and respiratory systems: Secondary | ICD-10-CM

## 2011-03-25 DIAGNOSIS — I2510 Atherosclerotic heart disease of native coronary artery without angina pectoris: Secondary | ICD-10-CM

## 2011-03-25 LAB — CBC WITH DIFFERENTIAL/PLATELET
Basophils Relative: 0.5 % (ref 0.0–3.0)
Eosinophils Absolute: 0.2 10*3/uL (ref 0.0–0.7)
Hemoglobin: 10.6 g/dL — ABNORMAL LOW (ref 12.0–15.0)
Lymphocytes Relative: 25.9 % (ref 12.0–46.0)
MCHC: 33.8 g/dL (ref 30.0–36.0)
Monocytes Relative: 12 % (ref 3.0–12.0)
Neutrophils Relative %: 57.6 % (ref 43.0–77.0)
RBC: 3.42 Mil/uL — ABNORMAL LOW (ref 3.87–5.11)
WBC: 5.7 10*3/uL (ref 4.5–10.5)

## 2011-03-25 LAB — BASIC METABOLIC PANEL
BUN: 21 mg/dL (ref 6–23)
CO2: 26 mEq/L (ref 19–32)
Chloride: 102 mEq/L (ref 96–112)
Potassium: 4.3 mEq/L (ref 3.5–5.1)

## 2011-03-25 NOTE — Patient Instructions (Signed)
Lab work today. CBC and Bmet Your physician recommends that you schedule a follow-up appointment in:  Monday 4/23 at 345 pm with Dr.Stuckey

## 2011-03-26 ENCOUNTER — Telehealth: Payer: Self-pay | Admitting: Cardiology

## 2011-03-26 NOTE — Telephone Encounter (Signed)
Joyce Gross with [facility] calls today with update that pt does not need home health at this time.  She says pt feels confident about taking her medications. Mylo Red RN

## 2011-03-27 ENCOUNTER — Ambulatory Visit (HOSPITAL_COMMUNITY): Admission: RE | Admit: 2011-03-27 | Payer: Medicare Other | Source: Ambulatory Visit | Admitting: Cardiology

## 2011-03-30 ENCOUNTER — Encounter: Payer: Self-pay | Admitting: Cardiology

## 2011-03-30 ENCOUNTER — Ambulatory Visit (INDEPENDENT_AMBULATORY_CARE_PROVIDER_SITE_OTHER): Payer: Medicare Other | Admitting: Cardiology

## 2011-03-30 VITALS — BP 180/60 | HR 74 | Resp 18 | Ht 64.0 in | Wt 144.0 lb

## 2011-03-30 DIAGNOSIS — I509 Heart failure, unspecified: Secondary | ICD-10-CM

## 2011-03-30 DIAGNOSIS — I10 Essential (primary) hypertension: Secondary | ICD-10-CM

## 2011-03-30 DIAGNOSIS — I2510 Atherosclerotic heart disease of native coronary artery without angina pectoris: Secondary | ICD-10-CM

## 2011-03-30 NOTE — Patient Instructions (Addendum)
Your physician has recommended you make the following change in your medication: INCREASE Carvedilol to 6.25mg  take one tablet by mouth twice a day (3.125mg  take 2 tablets by mouth twice a day)  Your physician recommends that you schedule a follow-up appointment in: 2 WEEKS

## 2011-03-30 NOTE — Assessment & Plan Note (Signed)
No recurrent chest pain at present.  We will continue current medical treatment at present.  No change at present.  Will follow up in two weeks, recheck CBC, and discuss options in detail at that time with optimized BP and elevated Hgb.  She would prefer to be treated medically.  Will discuss with Dr. Daleen Squibb.

## 2011-03-30 NOTE — Progress Notes (Signed)
HPI:  She has had absolutely no chest pain, and her shortness of breath has essentially resolved.  Feels much better.  Hgb is up a bit.  Our plan had been to consider PCI of her prox CFX, but with anemia, and another cause for her shortness of breath it seemed most prudent to wait.  She has appropriately asked if she could be managed medically, and we have discussed this. The territory is moderately large, and when conditions are ideal, FFR might be the best way to consider this in light of what we know about her anatomy.  Her BPs have been up a bit, and she has kept a log.  Tolerating carvedilol.    Current Outpatient Prescriptions  Medication Sig Dispense Refill  . acetaminophen (TYLENOL) 500 MG tablet Take 500 mg by mouth every 6 (six) hours as needed.        Marland Kitchen amitriptyline (ELAVIL) 10 MG tablet Take 10 mg by mouth at bedtime as needed.       . Ascorbic Acid (VITAMIN C) 500 MG tablet Take 500 mg by mouth daily.        Marland Kitchen aspirin 81 MG tablet Take 81 mg by mouth daily.        . Calcium Carbonate-Vitamin D (CALTRATE 600+D) 600-400 MG-UNIT per chew tablet Chew 1 tablet by mouth daily.        . carvedilol (COREG) 3.125 MG tablet Take 3.125 mg by mouth 2 (two) times daily with a meal.        . cetirizine (ZYRTEC) 10 MG tablet Take 10 mg by mouth daily as needed.        . Cholecalciferol (VITAMIN D) 1000 UNITS capsule Take 1,000 Units by mouth daily.        . CRESTOR 20 MG tablet TAKE 1 TABLET DAILY  90 tablet  2  . felodipine (PLENDIL) 10 MG 24 hr tablet Take 10 mg by mouth daily.        . ferrous sulfate 325 (65 FE) MG EC tablet Take 325 mg by mouth 2 (two) times daily.        . furosemide (LASIX) 20 MG tablet TAKE 1 TABLET ONCE A DAY  90 tablet  2  . insulin glargine (LANTUS) 100 UNIT/ML injection Inject 40 Units into the skin at bedtime.        . insulin lispro (HUMALOG) 100 UNIT/ML injection Inject 5 to 15 units subcutaneously as needed before meals  10 mL  4  . levothyroxine (SYNTHROID,  LEVOTHROID) 75 MCG tablet Take 75 mcg by mouth daily.        . methocarbamol (ROBAXIN) 500 MG tablet Take 1 tablet (500 mg total) by mouth 3 (three) times daily as needed. For muscle spasms as needed  90 tablet  5  . multivitamin (THERAGRAN) per tablet Take 1 tablet by mouth daily.        . nitroGLYCERIN (NITROSTAT) 0.4 MG SL tablet Place 0.4 mg under the tongue every 5 (five) minutes as needed. May repeat up to 3 doses       . omeprazole (PRILOSEC) 20 MG capsule Take one tablet by mouth two times daily       . quinapril (ACCUPRIL) 40 MG tablet Take 40 mg by mouth daily.        . traMADol (ULTRAM) 50 MG tablet Take 50 mg by mouth every 6 (six) hours as needed.        Marland Kitchen DISCONTD: co-enzyme Q-10 30 MG capsule Take 30  mg by mouth daily.          Allergies  Allergen Reactions  . Ibuprofen     REACTION: edema---thyroid problems    Past Medical History  Diagnosis Date  . Retinopathy   . Hypertension   . CHF (congestive heart failure)   . CAD (coronary artery disease)   . Cerebrovascular disease   . Peripheral vascular disease   . Hypercholesterolemia   . Diabetes mellitus   . Hypothyroidism   . Hiatal hernia   . Degenerative joint disease   . Osteoporosis   . Vitamin D deficiency   . Anemia of chronic disease     Past Surgical History  Procedure Date  . Abdominal aortogram 07/27/2003    by Dr. Maricela Curet  . Bilateral lower extremity angiogram 07/27/2003    by Dr. Maricela Curet  . Selective angiography,right superficial femoral artery, right and left iliac arteries 07/27/2003    by Dr. Maricela Curet  . Measurement of gradient right and left iliac arteries 07/27/2003    by Dr. Maricela Curet  . Bilateral cataract sugery 2000 and 2002    by Dr. Cecilie Kicks  . Repair left hip fracture 04/2008    by Dr. Charlann Boxer    No family history on file.  History   Social History  . Marital Status: Married    Spouse Name: N/A    Number of Children: 2  . Years of Education: N/A   Occupational History   . retired    Social History Main Topics  . Smoking status: Former Smoker    Types: Cigarettes    Quit date: 12/07/1974  . Smokeless tobacco: Never Used   Comment: pt started smoking in 1956  . Alcohol Use: No  . Drug Use: No  . Sexually Active: Not on file   Other Topics Concern  . Not on file   Social History Narrative   Married to [employer] has kidney and pancrease transplant    ROS: Please see the HPI.  All other systems reviewed and negative.  PHYSICAL EXAM:  BP 180/60  Pulse 74  Resp 18  Ht 5\' 4"  (1.626 m)  Wt 144 lb (65.318 kg)  BMI 24.72 kg/m2  General: Well developed, well nourished, in no acute distress. Head:  Normocephalic and atraumatic. Neck: no JVD. Bilateral carotid bruits.  Lungs: Clear to auscultation and percussion. Heart: Normal S1 and S2.  SEM 2/6 without diastolic murmur. Abdomen:  Normal bowel sounds; soft; non tender; no organomegaly Pulses: Pulses normal in all 4 extremities. Extremities: No clubbing or cyanosis. No edema. Neurologic: Alert and oriented x 3.  EKG:  NSR.  Nonspecific ST and T changes.  Prolonged QTc.  ASSESSMENT AND PLAN:f

## 2011-03-30 NOTE — Assessment & Plan Note (Signed)
Bp remains elevated.  Therefore, increase carvedilol to 6.26 BID and follow.  This should improve.

## 2011-03-30 NOTE — Assessment & Plan Note (Signed)
Symptoms are presently resolved.

## 2011-04-01 NOTE — Progress Notes (Signed)
HPI:  Mrs. Krus is being seen in follow up.  She was just discharged from the hospital.   She was cathed by Dr. Jones Broom, this revealed some disease above her previous stent site in the prox cfx.  However, he main reason for admission was dyspnea, largely after being placed on prednisone.  This settled down, and she was discharged by Dr. Daleen Squibb.  She has had an anemia, and in addition, we deferred any procedure until she was recovered as it was felt the lesion was independent of the reason for admission.  She is now for followup, and discussion for PCI.  Feels better in general, and gradually improved.  We are continuing to follow her closely.  She is a patient of Dr. Daleen Squibb.    Current Outpatient Prescriptions  Medication Sig Dispense Refill  . acetaminophen (TYLENOL) 500 MG tablet Take 500 mg by mouth every 6 (six) hours as needed.        Marland Kitchen amitriptyline (ELAVIL) 10 MG tablet Take 10 mg by mouth at bedtime as needed.       . Ascorbic Acid (VITAMIN C) 500 MG tablet Take 500 mg by mouth daily.        Marland Kitchen aspirin 81 MG tablet Take 81 mg by mouth daily.        . Calcium Carbonate-Vitamin D (CALTRATE 600+D) 600-400 MG-UNIT per chew tablet Chew 1 tablet by mouth daily.        . cetirizine (ZYRTEC) 10 MG tablet Take 10 mg by mouth daily as needed.        . Cholecalciferol (VITAMIN D) 1000 UNITS capsule Take 1,000 Units by mouth daily.        . CRESTOR 20 MG tablet TAKE 1 TABLET DAILY  90 tablet  2  . felodipine (PLENDIL) 10 MG 24 hr tablet Take 10 mg by mouth daily.        . furosemide (LASIX) 20 MG tablet TAKE 1 TABLET ONCE A DAY  90 tablet  2  . insulin glargine (LANTUS) 100 UNIT/ML injection Inject 40 Units into the skin at bedtime.        . insulin lispro (HUMALOG) 100 UNIT/ML injection Inject 5 to 15 units subcutaneously as needed before meals  10 mL  4  . levothyroxine (SYNTHROID, LEVOTHROID) 75 MCG tablet Take 75 mcg by mouth daily.        . methocarbamol (ROBAXIN) 500 MG tablet Take 1 tablet (500  mg total) by mouth 3 (three) times daily as needed. For muscle spasms as needed  90 tablet  5  . multivitamin (THERAGRAN) per tablet Take 1 tablet by mouth daily.        . nitroGLYCERIN (NITROSTAT) 0.4 MG SL tablet Place 0.4 mg under the tongue every 5 (five) minutes as needed. May repeat up to 3 doses       . omeprazole (PRILOSEC) 20 MG capsule Take one tablet by mouth two times daily       . quinapril (ACCUPRIL) 40 MG tablet Take 40 mg by mouth daily.        . traMADol (ULTRAM) 50 MG tablet Take 50 mg by mouth every 6 (six) hours as needed.        . carvedilol (COREG) 6.25 MG tablet Take 1 tablet (6.25 mg total) by mouth 2 (two) times daily with a meal.      . ferrous sulfate 325 (65 FE) MG EC tablet Take 325 mg by mouth 2 (two) times daily.  Allergies  Allergen Reactions  . Ibuprofen     REACTION: edema---thyroid problems    Past Medical History  Diagnosis Date  . Retinopathy   . Hypertension   . CHF (congestive heart failure)   . CAD (coronary artery disease)   . Cerebrovascular disease   . Peripheral vascular disease   . Hypercholesterolemia   . Diabetes mellitus   . Hypothyroidism   . Hiatal hernia   . Degenerative joint disease   . Osteoporosis   . Vitamin D deficiency   . Anemia of chronic disease     Past Surgical History  Procedure Date  . Abdominal aortogram 07/27/2003    by Dr. Maricela Curet  . Bilateral lower extremity angiogram 07/27/2003    by Dr. Maricela Curet  . Selective angiography,right superficial femoral artery, right and left iliac arteries 07/27/2003    by Dr. Maricela Curet  . Measurement of gradient right and left iliac arteries 07/27/2003    by Dr. Maricela Curet  . Bilateral cataract sugery 2000 and 2002    by Dr. Cecilie Kicks  . Repair left hip fracture 04/2008    by Dr. Charlann Boxer    No family history on file.  History   Social History  . Marital Status: Married    Spouse Name: N/A    Number of Children: 2  . Years of Education: N/A    Occupational History  . retired    Social History Main Topics  . Smoking status: Former Smoker    Types: Cigarettes    Quit date: 12/07/1974  . Smokeless tobacco: Never Used   Comment: pt started smoking in 1956  . Alcohol Use: No  . Drug Use: No  . Sexually Active: Not on file   Other Topics Concern  . Not on file   Social History Narrative   Married to [employer] has kidney and pancrease transplant    ROS: Please see the HPI.  All other systems reviewed and negative.  PHYSICAL EXAM:  BP 160/50  Pulse 72  Resp 18  Ht 5\' 4"  (1.626 m)  Wt 147 lb (66.679 kg)  BMI 25.23 kg/m2  General: Well developed, well nourished, in no acute distress. Head:  Normocephalic and atraumatic. Neck: no JVD Lungs: Clear to auscultation and percussion.  No rales at present.   Heart: Normal S1 and S2.  No murmur, rubs or gallops.  Abdomen:  Normal bowel sounds; soft; non tender; no organomegaly Pulses: Pulses normal in all 4 extremities. Extremities: No clubbing or cyanosis. No edema. Neurologic: Alert and oriented x 3.  EKG:  NSR. WNL. ASSESSMENT AND PLAN:

## 2011-04-01 NOTE — Assessment & Plan Note (Signed)
Was switched from atenelol to carvedilol.  Doing well with this, but BP remains elevated.  Will see back in a few days, reassess, and then make changes as appropriate.

## 2011-04-01 NOTE — Assessment & Plan Note (Signed)
We need to reassess.  Was below ten and needs to be monitored and rechecked.

## 2011-04-01 NOTE — Assessment & Plan Note (Signed)
She came in with shortness of breath after starting on Prednisone. She ended up getting cathed.  She is anemic, and also has had retinopathy.  Unfortunately, it was done at [facility], and that physician is apparently gone.   Bensihmon cath note from [facility] review---not in EPIC at present.  ASSESSMENT:   1. Coronary artery disease with apparent high-grade calcified lesion       in the mid left circumflex.   2. Previous obtuse marginal 2 stents are widely patent.   3. Normal left ventricular function with end-diastolic pressure of       approximately 20 after diuresis.      PLAN/DISCUSSION:  I have reviewed the films with Dr. Riley Kill.  She will   probably need a percutaneous intervention on her left circumflex.   However, careful attention is being given in fact of her previous   diabetic retinopathy complicated by retinal hemorrhage and whether or   not she would be a candidate for long-term Plavix for at least a year.   Given her anginal symptoms, I suspect that the benefits of angioplasty   will outweigh the risks of bleeding.  We discussed this with her and her   family.   Will get CBC, defer for present, see back early next week.  She wonders if she needs this done.

## 2011-04-07 NOTE — Cardiovascular Report (Signed)
Nicole [name], Nicole [name]            ACCOUNT NO.:  [id number]  MEDICAL RECORD NO.:  [id number]           PATIENT TYPE:  I  LOCATION:  4735                         FACILITY:  [facility]  PHYSICIAN:  Bevelyn Buckles. Timya Trimmer, MDDATE OF BIRTH:  12-31-1934  DATE OF PROCEDURE:  03/20/2011 DATE OF DISCHARGE:                           CARDIAC CATHETERIZATION   PRIMARY CARE PHYSICIAN:  Lonzo Cloud. Kriste Basque, MD  CARDIOLOGIST:  Jesse Sans. Wall, MD, [facility]  PATIENT IDENTIFICATION:  [name] is a very pleasant 76 year old woman with a history of diabetes complicated by diabetic retinopathy status post proliferative retinal bleed many years ago.  She also has hypertension, diastolic heart failure and coronary artery disease.  She is status post stenting of the OM-1 in 1998.  She was admitted with unstable angina and heart failure.  She says that recently in March she has been having progressive chest pressure which culminated just a couple of days ago.  She does get relief with nitroglycerin.  Cardiac markers have been normal.  PROCEDURES PERFORMED: 1. Selective coronary angiography. 2. Left heart catheterization. 3. Left ventriculogram.  DESCRIPTION OF PROCEDURE:  The risks and indications were explained. Consent was signed and placed on the chart.  Right groin area was prepped and draped in routine sterile fashion and anesthetized with 1% local lidocaine.  A 5-French arterial sheath was placed using a modified Seldinger technique.  Standard catheters including JL-4, JR-4 and angled pigtail were used.  All catheter exchanges made over wire.  There were no apparent complications.  Central aortic pressure 163/45 with a mean of 84.  LV pressure 173/12 with EDP of 22.  Left main was normal.  LAD was difficult to visualize but was a long vessel coursing to the apex.  It gave off a single diagonal.  Just after the diagonal, there appeared to be a 50-60% lesion.  Otherwise, just mild luminal  plaquing.  Left circumflex was a very large dominant vessel gave off a moderate size ramus branch, a small OM-1, a large OM-2, a posterolateral and a PDA.  The proximal circumflex was heavily calcified and had what appeared to be a 90-95% stenosis prior to the OM-2.  There was mild diffuse disease distally in the circumflex.  In the mid AV groove circ after the OM-2, there was a 50% lesion.  In the OM-2, there was a long area of stenting in a proximal section and stents were widely patent.  Right coronary artery was a small nondominant vessel with a diffuse 70- 80% lesion throughout the proximal to mid section.  Left ventriculogram done in the RAO position showed an EF of 65% with no regional wall motion abnormalities.  ASSESSMENT: 1. Coronary artery disease with apparent high-grade calcified lesion     in the mid left circumflex. 2. Previous obtuse marginal 2 stents are widely patent. 3. Normal left ventricular function with end-diastolic pressure of     approximately 20 after diuresis.  PLAN/DISCUSSION:  I have reviewed the films with Dr. Riley Kill.  She will probably need a percutaneous intervention on her left circumflex. However, careful attention is being given in fact of her previous diabetic retinopathy complicated by  retinal hemorrhage and whether or not she would be a candidate for long-term Plavix for at least a year. Given her anginal symptoms, I suspect that the benefits of angioplasty will outweigh the risks of bleeding.  We discussed this with her and her family.     Bevelyn Buckles. Calvin Chura, MD     DRB/MEDQ  D:  03/20/2011  T:  03/21/2011  Job:  [id number]  Electronically Signed by Arvilla Meres MD on 04/07/2011 07:16:08 PM

## 2011-04-08 ENCOUNTER — Telehealth: Payer: Self-pay | Admitting: Cardiology

## 2011-04-08 DIAGNOSIS — I2510 Atherosclerotic heart disease of native coronary artery without angina pectoris: Secondary | ICD-10-CM

## 2011-04-08 MED ORDER — CARVEDILOL 6.25 MG PO TABS: 6.2500 mg | ORAL_TABLET | Freq: Two times a day (BID) | ORAL | Status: DC

## 2011-04-08 NOTE — Telephone Encounter (Addendum)
Pt states dr has change carvedilol 6.25mg  dosage. Pt wants to know when the dr is going to call it in to her pharmacy. [facility] # [phone].  Pt wants to know when meds been call in.  Pt wants to talk to the nurse before refill meds. Pt thinks she having a reaction due to meds.

## 2011-04-08 NOTE — Telephone Encounter (Signed)
I spoke with the pt and she complains of increased frequency in urination and a burning sensation in her lower legs and feet.  The pt said it feels like a sunburn but she does not have any redness or rash on legs.  These symptoms started on Sunday.  I made the pt aware that this is most likely not coming from the higher dose of Carvedilol.  I recommended that the pt contact Dr Kriste Basque to discuss symptoms.  I will send in a prescription to the pt's pharmacy for Carvedilol 6.25mg  bid.

## 2011-04-09 NOTE — Discharge Summary (Signed)
Nicole [name], Nicole [name]            ACCOUNT NO.:  [id number]  MEDICAL RECORD NO.:  [id number]           PATIENT TYPE:  I  LOCATION:  4735                         FACILITY:  [facility]  PHYSICIAN:  Ragan Duhon C. Taffie Eckmann, MD, FACCDATE OF BIRTH:  12/13/1934  DATE OF ADMISSION:  03/19/2011 DATE OF DISCHARGE:  03/23/2011                              DISCHARGE SUMMARY   PRIMARY CARDIOLOGIST:  Maisie Fus C. Daleen Squibb, MD, [facility]  PRIMARY CARE PROVIDER:  Lonzo Cloud. Kriste Basque, MD  DISCHARGE DIAGNOSIS:  Unstable angina.  SECONDARY DIAGNOSES: 1. Coronary artery disease status post prior percutaneous intervention     x2 in 1998 with catheterization this admission showing significant     left circumflex disease pending intervention. 2. Acute diastolic congestive heart failure. 3. Peripheral arterial disease with significant right superficial     femoral artery, left anterior tibial, and left posterior tibial     stenoses found on angiogram in 2008. 4. Diabetes mellitus. 5. History of diabetic retinopathy status post proliferative retinal     bleed many years ago. 6. Hypertension. 7. Hyperlipidemia. 8. Stage III chronic kidney disease. 9. Stable carotid arterial disease followed by yearly carotid     ultrasounds. 10.Anemia of chronic disease. 11.Hypothyroidism. 12.Hiatal hernia. 13.Degenerative joint disease. 14.Osteoporosis. 15.Status post bilateral cataract surgeries first in 2000 and then in     2002. 16.Status post left hip fracture status post open reduction and     internal fixation in May 2009. 17.Remote tobacco abuse, quitting in 2011.  ALLERGIES:  UNKNOWN EYE DROP causing syncope.  PROCEDURES: 1. A 2-D echocardiogram performed on March 20, 2011, revealing an EF     of 65% to 70% with normal Trenyce Loera motion and without Deneka Greenwalt motion     abnormalities.  Grade 2 diastolic dysfunction.  Mild mitral     regurgitation.  Mildly dilated left atrium. 2. Diagnostic left heart rate catheterization on March 20, 2011,     showing 50% mid stenosis in the LAD, 90% calcified stenosis in the     proximal to mid left circumflex.  A patent stent in the proximal to     mid second obtuse marginal, 70% to 80% proximal stenosis in a     nondominant right coronary artery.  EF was 65%.  LVEDP was     approximately 20 mm.  HISTORY OF PRESENT ILLNESS:  A 76 year old Caucasian female with prior history of coronary artery disease status post percutaneous intervention x2 in 1998 who was in her usual state of health until approximately 1 month prior to admission when she required steroid taper for neck strain.  Following this taper, she began to experience progressive lower extremity edema, orthopnea, PND, dyspnea on exertion along with a 10- pound weight gain over a period of about a month.  She also noted increasing substernal chest pressure, requiring sublingual nitrates.  On the day of admission, the patient had recurrent substernal chest pressure and dyspnea prompting presentation to the [facility] ED.  ECG was nonacute.  Point-of-care markers were negative.  Chest x-ray was consistent with interstitial edema and the patient had volume overload on exam.  She was admitted for further  evaluation.  HOSPITAL COURSE:  The patient ruled out for MI.  There was concern that her volume overload and heart failure symptoms may be related to ischemia, and after a reasonable amount of diuresis allowing the patient to lie flat, cardiac catheterization was pursued.  The patient underwent diagnostic cardiac catheterization on March 20, 2011, revealing significant calcified stenosis in the left circumflex of approximately 90%.  The patient otherwise had nonobstructive disease and normal LV function with EF of 65%.  It should be noted that her left ventricular end-diastolic pressure was approximately 20 mmHg.  Films were reviewed and it was felt that the patient would likely benefit from percutaneous intervention to the  left circumflex following additional diuresis; however, with her history of diabetic retinopathy and proliferative bleed in the remote past we have had concerns about placing the patient both on aspirin and Plavix therapy.  At this time, we have deferred PCI until later this week.  Of note, the patient has been anemic throughout this hospitalization with H and H in the 9 and 29 range.  This has been stable.  Her stool was guaiac-negative.  Iron studies showed a low normal iron and ferritin.  She has been placed on iron therapy.  As noted above, the patient had volume overload on admission and LVEDP at the time of catheterization was 20 mmHg.  She was additionally diuresed with reduction in weight from 69.49 kg on March 19, 2011, down to 65 kg today.  She has had a negative of approximately 5 L during this admission.  The patient will be discharged home today in good condition.  She will follow up this Wednesday with Dr. Riley Kill in our office to discuss percutaneous options and also repeat CBC.  Provided that she is stable, we have tentatively arranged for her to undergo PCI of the left circumflex on Friday, March 27, 2011, at 9 a.m.  DISCHARGE LABORATORY DATA:  Hemoglobin 9.3, hematocrit 28.3, WBC 5.8, platelets 212,000.  Sodium 137, potassium 3.7, chloride 102, CO2 26, BUN 28, creatinine 1.16, glucose 130.  Total bilirubin 1.1, alkaline phosphatase 108, AST 21, ALT 14, total protein 6.4, albumin 3.2, calcium 0.4, magnesium 1.8.  CK 78, MB 2.1, troponin-I of 0.04.  TSH 3.702. Serum iron 48, TIBC 309, UIBC 261, ferritin 28.  Fecal occult blood was negative.  DISPOSITION:  The patient will be discharged home today in good condition.  FOLLOWUP APPOINTMENTS:  As outlined above, the patient will follow up with Dr. Riley Kill in our office on March 25, 2011, at 10:45 a.m.  She will undergo CBC during that visit to reevaluate her anemia.  She was then tentatively scheduled for PCI of the  left circumflex on Friday, March 27, 2011, for her to arrive at [facility] at 7 a.m. for 9 a.m. procedure.  We have already arranged for follow up following this procedure with Dr. Daleen Squibb on Apr 16, 2011, at 3:15 p.m.  DISCHARGE MEDICATIONS: 1. Carvedilol 3.125 mg b.i.d. 2. Ferrous sulfate 325 mg b.i.d. 3. Nitroglycerin 0.4 mg sublingual p.r.n. chest pain.  Aspirin 81 mg     daily. 4. Crestor 20 mg at bedtime. 5. Felodipine 10 mg daily. 6. Furosemide 20 mg daily. 7. Humalog sliding scale t.i.d. with meals. 8. Lantus 40 units q.p.m. 9. Levothyroxine 75 mcg daily. 10.Methocarbamol 500 mg daily p.r.n. 11.Omeprazole 20 mg daily. 12.Quinapril 40 mg daily. 13.Tramadol 50 mg q.6 h. p.r.n. 14.Vitamin C 500 mg daily. 15.Vitamin D 1000 units daily. 16.Zyrtec 10 mg  at bedtime p.r.n.  OUTSTANDING LABORATORY DATA AND STUDIES:  Followup CBC on March 25, 2011.  DURATION OF DISCHARGE ENCOUNTER:  Sixty minutes including physician time.     Nicolasa Ducking, ANP   ______________________________ Jesse Sans. Daleen Squibb, MD, [facility]    CB/MEDQ  D:  03/23/2011  T:  03/23/2011  Job:  [id number]  cc:   Lonzo Cloud. Kriste Basque, MD  Electronically Signed by Nicolasa Ducking ANP on 04/04/2011 03:59:13 PM Electronically Signed by Valera Castle MD [facility] on 04/09/2011 08:18:42 AM

## 2011-04-14 ENCOUNTER — Ambulatory Visit (INDEPENDENT_AMBULATORY_CARE_PROVIDER_SITE_OTHER): Payer: Medicare Other | Admitting: Cardiology

## 2011-04-14 ENCOUNTER — Telehealth: Payer: Self-pay | Admitting: Cardiology

## 2011-04-14 ENCOUNTER — Encounter: Payer: Self-pay | Admitting: Cardiology

## 2011-04-14 DIAGNOSIS — E7800 Pure hypercholesterolemia, unspecified: Secondary | ICD-10-CM

## 2011-04-14 DIAGNOSIS — I10 Essential (primary) hypertension: Secondary | ICD-10-CM

## 2011-04-14 DIAGNOSIS — I2510 Atherosclerotic heart disease of native coronary artery without angina pectoris: Secondary | ICD-10-CM

## 2011-04-14 LAB — CBC WITH DIFFERENTIAL/PLATELET
Basophils Absolute: 0 10*3/uL (ref 0.0–0.1)
Eosinophils Absolute: 0.1 10*3/uL (ref 0.0–0.7)
HCT: 34.6 % — ABNORMAL LOW (ref 36.0–46.0)
Lymphs Abs: 1.4 10*3/uL (ref 0.7–4.0)
MCHC: 34 g/dL (ref 30.0–36.0)
MCV: 91.7 fl (ref 78.0–100.0)
Monocytes Absolute: 0.5 10*3/uL (ref 0.1–1.0)
Neutrophils Relative %: 62.3 % (ref 43.0–77.0)
Platelets: 211 10*3/uL (ref 150.0–400.0)
RDW: 14.2 % (ref 11.5–14.6)
WBC: 5.3 10*3/uL (ref 4.5–10.5)

## 2011-04-14 LAB — URIC ACID: Uric Acid, Serum: 5.5 mg/dL (ref 2.4–7.0)

## 2011-04-14 LAB — BASIC METABOLIC PANEL
Calcium: 9.8 mg/dL (ref 8.4–10.5)
Creatinine, Ser: 0.9 mg/dL (ref 0.4–1.2)
GFR: 66.75 mL/min (ref >60.00)
Sodium: 140 mEq/L (ref 135–145)

## 2011-04-14 NOTE — Telephone Encounter (Signed)
I called the pt and made her aware that I had not called her home after the appointment today.

## 2011-04-14 NOTE — Telephone Encounter (Signed)
Pt stated she was called from [phone] husband can't remember what was said.

## 2011-04-14 NOTE — Patient Instructions (Signed)
Your physician recommends that you have lab work today:  BMP, CBC, Uric Acid   Your physician recommends that you schedule a follow-up appointment in: 04/29/11 with Dr Riley Kill  Your physician recommends that you continue on your current medications as directed. Please refer to the Current Medication list given to you today.

## 2011-04-15 NOTE — Assessment & Plan Note (Signed)
Currently on lipid lowering. Continue medical treatment.

## 2011-04-15 NOTE — Progress Notes (Signed)
HPI:  She comes back in for follow up.  We spent twenty minutes reviewing her films and discussing options with her.  She is not having really any chest pain, but does get winded with much activity.  We showed her the anatomic issue.  Her CFX stent, which I placed, is patent, but there is diffuse plaque proximally which could be significant.  She is not particularly short of breath at rest.  Her husband has dementia, and she is fairly limited however, and her time consumed with caring for him.  She is better since discharge from the hospital.  We carefully discussed the options to her, particularly in light of her anemia, and IDDM.  Stools were negative.   She is sleeping well at this point in time.    Current Outpatient Prescriptions  Medication Sig Dispense Refill  . acetaminophen (TYLENOL) 500 MG tablet Take 500 mg by mouth every 6 (six) hours as needed.        Marland Kitchen amitriptyline (ELAVIL) 10 MG tablet Take 10 mg by mouth at bedtime as needed.       . Ascorbic Acid (VITAMIN C) 500 MG tablet Take 500 mg by mouth daily.        Marland Kitchen aspirin 81 MG tablet Take 81 mg by mouth daily.        . Calcium Carbonate-Vitamin D (CALTRATE 600+D) 600-400 MG-UNIT per chew tablet Chew 1 tablet by mouth daily.        . carvedilol (COREG) 6.25 MG tablet Take 1 tablet (6.25 mg total) by mouth 2 (two) times daily with a meal.  60 tablet  11  . cetirizine (ZYRTEC) 10 MG tablet Take 10 mg by mouth daily as needed.        . Cholecalciferol (VITAMIN D) 1000 UNITS capsule Take 1,000 Units by mouth daily.        . CRESTOR 20 MG tablet TAKE 1 TABLET DAILY  90 tablet  2  . felodipine (PLENDIL) 10 MG 24 hr tablet Take 10 mg by mouth daily.        . ferrous sulfate 325 (65 FE) MG EC tablet Take 325 mg by mouth 2 (two) times daily.        . furosemide (LASIX) 20 MG tablet TAKE 1 TABLET ONCE A DAY  90 tablet  2  . insulin glargine (LANTUS) 100 UNIT/ML injection Inject 40 Units into the skin at bedtime.        . insulin lispro (HUMALOG)  100 UNIT/ML injection Inject 5 to 15 units subcutaneously as needed before meals  10 mL  4  . levothyroxine (SYNTHROID, LEVOTHROID) 75 MCG tablet Take 75 mcg by mouth daily.        . methocarbamol (ROBAXIN) 500 MG tablet Take 1 tablet (500 mg total) by mouth 3 (three) times daily as needed. For muscle spasms as needed  90 tablet  5  . multivitamin (THERAGRAN) per tablet Take 1 tablet by mouth daily.        . nitroGLYCERIN (NITROSTAT) 0.4 MG SL tablet Place 0.4 mg under the tongue every 5 (five) minutes as needed. May repeat up to 3 doses       . omeprazole (PRILOSEC) 20 MG capsule Take one tablet by mouth two times daily       . quinapril (ACCUPRIL) 40 MG tablet Take 40 mg by mouth daily.        . traMADol (ULTRAM) 50 MG tablet Take 50 mg by mouth every 6 (six) hours  as needed.          Allergies  Allergen Reactions  . Ibuprofen     REACTION: edema---thyroid problems    Past Medical History  Diagnosis Date  . Retinopathy   . Hypertension   . CHF (congestive heart failure)   . CAD (coronary artery disease)   . Cerebrovascular disease   . Peripheral vascular disease   . Hypercholesterolemia   . Diabetes mellitus   . Hypothyroidism   . Hiatal hernia   . Degenerative joint disease   . Osteoporosis   . Vitamin D deficiency   . Anemia of chronic disease     Past Surgical History  Procedure Date  . Abdominal aortogram 07/27/2003    by Dr. Maricela Curet  . Bilateral lower extremity angiogram 07/27/2003    by Dr. Maricela Curet  . Selective angiography,right superficial femoral artery, right and left iliac arteries 07/27/2003    by Dr. Maricela Curet  . Measurement of gradient right and left iliac arteries 07/27/2003    by Dr. Maricela Curet  . Bilateral cataract sugery 2000 and 2002    by Dr. Cecilie Kicks  . Repair left hip fracture 04/2008    by Dr. Charlann Boxer    No family history on file.  History   Social History  . Marital Status: Married    Spouse Name: N/A    Number of Children: 2  .  Years of Education: N/A   Occupational History  . retired    Social History Main Topics  . Smoking status: Former Smoker    Types: Cigarettes    Quit date: 12/07/1974  . Smokeless tobacco: Never Used   Comment: pt started smoking in 1956  . Alcohol Use: No  . Drug Use: No  . Sexually Active: Not on file   Other Topics Concern  . Not on file   Social History Narrative   Married to [employer] has kidney and pancrease transplant    ROS: Please see the HPI.  All other systems reviewed and negative.  PHYSICAL EXAM:  BP 160/50  Pulse 64  Resp 18  Ht 5\' 4"  (1.626 m)  Wt 144 lb (65.318 kg)  BMI 24.72 kg/m2  General: Well developed, well nourished, in no acute distress. Head:  Normocephalic and atraumatic. Neck: no JVD Lungs: Clear to auscultation and percussion. Heart: Normal S1 and S2.  Soft SEM.Marland Kitchen No DM.    Abdomen:  Normal bowel sounds; soft; non tender; no organomegaly Pulses: Pulses normal in all 4 extremities. Extremities: No clubbing or cyanosis. No edema. Neurologic: Alert and oriented x 3.  EKG: ECHO:  March 20, 2011  Study Conclusions    - Left ventricle: The cavity size was normal. Wall thickness was     increased in a pattern of mild LVH. Systolic function was     vigorous. The estimated ejection fraction was in the range of 65%     to 70%. Wall motion was normal; there were no regional wall motion     abnormalities. Features are consistent with a pseudonormal left     ventricular filling pattern, with concomitant abnormal relaxation     and increased filling pressure (grade 2 diastolic dysfunction).     Doppler parameters are consistent with high ventricular filling     pressure.   - Mitral valve: Calcified annulus. Mild regurgitation.   - Left atrium: The atrium was mildly dilated. CATH: March 21, 2011 ASSESSMENT:   1. Coronary artery disease with apparent high-grade  calcified lesion       in the mid left circumflex.   2. Previous  obtuse marginal 2 stents are widely patent.   3. Normal left ventricular function with end-diastolic pressure of       approximately 20 after diuresis.      PLAN/DISCUSSION:  I have reviewed the films with Dr. Riley Kill.  She will   probably need a percutaneous intervention on her left circumflex.   However, careful attention is being given in fact of her previous   diabetic retinopathy complicated by retinal hemorrhage and whether or   not she would be a candidate for long-term Plavix for at least a year.   Given her anginal symptoms, I suspect that the benefits of angioplasty   will outweigh the risks of bleeding.  We discussed this with her and her   family.               Bevelyn Buckles. Bensimhon, MD      ASSESSMENT AND PLAN:

## 2011-04-15 NOTE — Assessment & Plan Note (Signed)
Somewhat better at present.  Continue on medical treatment at present.  Not all that well controlled.

## 2011-04-15 NOTE — Assessment & Plan Note (Signed)
Went over all of the options at present.  Will recheck her labs at present time.  If stable, will consider going to lab for IVUS/FFR of prox CFX, and decision regarding possible PCI.  With second generation stents, and stable status may be able to get by with 6 months of DAPT if that is all we can do.  Also, will measure P2 Y12 on treatment since she has IDDM.  This will guide therapy.  Labs and final decision pending at present.

## 2011-04-16 ENCOUNTER — Encounter: Payer: Medicare Other | Admitting: Cardiology

## 2011-04-21 NOTE — Assessment & Plan Note (Signed)
[facility]                            CARDIOLOGY OFFICE NOTE   DYLANA, SHAW                   MRN:          [id number]  DATE:09/30/2007                            DOB:          02-24-1935    Ms. Monts returns today for further management of her ischemic heart  disease and chronic diastolic heart failure.   Her shortness of breath is improved dramatically, and she is no longer  having to take nitroglycerin.  When we first evaluated her last month,  we performed a stress Myoview, which showed an EF of 76% with no scar or  ischemia.   I had increased her atenolol to 50 mg a day to improve diastolic  filling.  This actually made her feel worse, and now she is on 25 mg a  day, which was her starting dose.   She looks remarkably good today.   Her medications are unchanged otherwise.  She has had a little bit of  swelling in her legs, but this is improved.   Her blood pressure today is 140/60.  Her pulse is 60 and regular.  Weight is 158, which is down 1 pound.  HEENT:  Normocephalic and atraumatic.  PERRLA.  Extraocular movements  intact.  Sclerae are clear.  Facial symmetry is normal.  NECK:  Supple.  Carotid upstrokes are equal bilaterally with loud left  carotid bruit, and a softer right carotid bruit.  This is baseline with  40% to 59% bilateral internal carotid stenosis by ultrasound April 06, 2007.  LUNGS:  Clear to auscultation with no rales or rhonchi.  HEART:  Reveals a non-displaced PMI.  There is no S4.  Normal S1 and S2.  ABDOMINAL EXAM:  Soft with good bowel sounds.  No obvious organomegaly.  EXTREMITIES:  Reveal minimal edema on the right.  Pulses are intact, but  reduced.  SKIN:  Shows a few ecchymoses.  MUSCULOSKELETAL:  Intact.   ASSESSMENT AND PLAN:  I am delighted Ms. Browning is better.  She is  actually back on the same regimen she was on when she came in for  initial assessment in early September.  I am really  not sure what has  happened.  For the time being, we will continue with her current  medications.  We gave her some Crestor samples.  We will see her back  again in 6 months.  If she starts having recurrent symptoms, she will  let us know.    Thomas C. Daleen Squibb, MD, [facility]  Electronically Signed   TCW/MedQ  DD: 09/30/2007  DT: 10/01/2007  Job #: [id number]   cc:   Lonzo Cloud. Kriste Basque, MD

## 2011-04-21 NOTE — Assessment & Plan Note (Signed)
[facility]                            CARDIOLOGY OFFICE NOTE   HABIBA, TRELOAR                   MRN:          [id number]  DATE:03/13/2008                            DOB:          Sep 11, 1935    PROBLEMS:  Ms. Kroening returns today for further management of the  following issues:  1. Coronary artery disease.  She is having no further episodes of      shortness of breath or chest discomfort.  She had a negative stress      Myoview last fall which showed an EF of 76% with no scar ischemia.  2. Chronic diastolic heart failure.  This been stable.  Her weight has      been unchanged.   MEDICATIONS:  We had increased her atenolol to 50 mg a day to improve  diastolic filling.  This actually made her worse.  She is now back on 25  mg a day.  Ironically, she has not started her isosorbide which we  started in the past as well.  The rest of her medications are unchanged.   PHYSICAL EXAMINATION:  VITAL SIGNS:  Blood pressure shows a pressure of  158/62.  It is usually better this.  Pulse is 62 and regular.  Her  electrocardiogram shows sinus bradycardia.  PR, QRS and QTC intervals  were normal.  Her weight is 153.  NECK:  Carotids reveals a soft bruit on the right.  Thyroid is not  enlarged.  Trachea is midline.  LUNGS:  Clear.  HEART:  Reveals a regular rate and rhythm.  There is an S4 at the apex.  ABDOMEN:  Soft with good bowel sounds.  No midline bruit.  EXTREMITIES:  No cyanosis, clubbing or edema.  Pulses are intact.  No  sign of DVT.  NEURO:  Intact.   DIAGNOSTICS:  Preliminary Doppler show stable carotids with 40-59%  stenosis bilaterally.  She has antegrade flow in both vertebrals.   ASSESSMENT/PLAN:  I am delighted Ms. Garzon is doing well.  I have  asked her to continue with her current medical program.  We will plan on  seeing her back again in 6 months.     Thomas C. Daleen Squibb, MD, [facility]  Electronically Signed    TCW/MedQ  DD:  03/13/2008  DT: 03/13/2008  Job #: [id number]

## 2011-04-21 NOTE — Assessment & Plan Note (Signed)
[facility]                            CARDIOLOGY OFFICE NOTE   JALANI, CULLIFER                   MRN:          [id number]  DATE:09/10/2008                            DOB:          [birth year]    Ms. Mares returns today for further management of the following  issues:  1. Asymptomatic nonobstructive carotid disease.  Her last carotid      Dopplers on March 13, 2008, which showed a 40-59% bilateral internal      carotid stenosis.  She has antegrade flow in both vertebrals.      Follow up due on April 2010.  2. Coronary artery disease.  She is having no chest discomfort or      shortness of breath.  Her last stress Myoview was August 26, 2007, which showed no ischemia, EF 76%.  3. Chronic diastolic heart failure.  This has been very stable.  Her      weight is actually down.   She fell and broke her left hip and had to have 3 pins placed.  This was  back on Mother's Day.  She has had a really hard time with this.   She has no cardiac complaints today.   She is currently on:  1. Lantus and Humalog.  2. Levothyroxine 75 mcg a day.  3. Lasix 20 mg a day.  4. Felodipine 10 mg a day.  5. Quinapril 40 mg a day.  6. Crestor 20 mg a day.  7. Aspirin 81 mg a day.  8. Caltrate and D.  9. Multivitamin.  10.Prilosec 20 mg p.o. b.i.d.  11.Amitriptyline 100 mg at bedtime.  12.Atenolol 25 mg a day.   PHYSICAL EXAMINATION:  VITAL SIGNS:  Her blood pressure today is 126/52.  Her pulse is 58 and regular.  Her weight is 149, down 4.  SKIN:  Pale and dry.  GENERAL:  Alert and oriented x3.  HEENT:  Unchanged, otherwise.  NECK:  Carotid upstrokes were equal bilateral with bilateral bruits,  right greater than the left.  Thyroid is not enlarged.  Trachea is  midline.  LUNGS:  Clear.  HEART:  Regular rate and rhythm.  No gallop.  ABDOMEN:  Soft.  Good bowel sounds.  No midline bruit.  EXTREMITIES:  No edema.  Pulses were present, but reduced.   No sign of  DVT.  NEURO:  Intact.   ASSESSMENT AND PLAN:  Ms. Denson is stable from our standpoint.  We  will see her back in April at which time she need carotid Dopplers.  No  changes were made in her medications.     Thomas C. Daleen Squibb, MD, [facility]  Electronically Signed    TCW/MedQ  DD: 09/10/2008  DT: 09/11/2008  Job #: [id number]

## 2011-04-21 NOTE — Assessment & Plan Note (Signed)
[facility]                            CARDIOLOGY OFFICE NOTE   Nicole [name], Nicole [name]                   MRN:          [id number]  DATE:09/02/2007                            DOB:          1935/04/13    Nicole [name] returns today because of increased shortness of breath on  exertion and chest discomfort.   She saw Nicolasa Ducking in the office last week. Stress Myoview showed  an ejection fraction of 76% with no scar or ischemia.   I suspect she has some diastolic congestive heart failure which she also  brings an article about today. This is due to her age, history of  hypertension, diabetes, and coronary disease.   She has also having problems with congestion and wheezing in the  mornings. Dr. Kriste Basque has ordered her to have an esophageal study and to  see Dr. Jarold Motto. I have reinforced this.   She also has low grade anemia with hemoglobin of 11.3. Her MCV was  normal, so this is probably chronic disease anemia. Dr. Kriste Basque is also  addressing this.   Her meds are outlined in the chart.   Her blood pressure today is 146/58, her pulse is 63 and regular, weight  is 164.  HEENT: Unchanged.  NECK: Shows no JVD. Carotid upstrokes are equal bilaterally with  bilateral bruits.  LUNGS: Clear without rhonchi or wheezes.  HEART: Reveals an S4.  ABDOMINAL EXAM: Soft.  EXTREMITIES: Reveal no edema. Pulses were present.   EKG: Shows normal sinus rhythm with ST segment flattening  inferolaterally. There has been no significant change.   ASSESSMENT:  Chronic diastolic heart failure.   PLAN:  1. Increase atenolol to 50 mg p.o. q.a.m.  2. See me back in several weeks.  3. Follow through with the gastrointestinal evaluation.  4. Question correcting anemia per Dr. Kriste Basque.     Thomas C. Daleen Squibb, MD, [facility]  Electronically Signed   TCW/MedQ  DD: 09/02/2007  DT: 09/02/2007  Job #: [id number]

## 2011-04-21 NOTE — Op Note (Signed)
NAMELIBBIE, BARTLEY NO.:  [id number]   MEDICAL RECORD NO.:  [id number]          PATIENT TYPE:  INP   LOCATION:  5005                         FACILITY:  [facility]   PHYSICIAN:  Madlyn Frankel. Charlann Boxer, M.D.  DATE OF BIRTH:  05/16/1935   DATE OF PROCEDURE:  04/16/2008  DATE OF DISCHARGE:                               OPERATIVE REPORT   PREOPERATIVE DIAGNOSIS:  Nondisplaced left femoral neck fracture.   POSTOPERATIVE DIAGNOSIS:  Nondisplaced left femoral neck fracture.   PROCEDURE:  Closed reduction percutaneous cannulated screw fixation of  the left hip utilizing 77.3-mm screws.   SURGEON:  Madlyn Frankel. Charlann Boxer, MD   ASSISTANT:  None.   ANESTHESIA:  General.   BLOOD LOSS:  50 mL.   COMPLICATIONS:  None.   DRAINS:  None.   INDICATIONS FOR PROCEDURE:  Ms. Mangal is a 76 year old female  unfortunately fell in the evening of Apr 15, 2008.  She is seen and  evaluated in emergency room and brought to the floor where she was  admitted to service.  Risks and benefits of hip fixation were discussed  including the risk of nonunion, avascular necrosis, and standard risk of  infection and DVT in a 76 year old patient with diabetes.   She initially was admitted through one of my partners for me to do  definitive management.  I reviewed with the patient the risks, benefits,  and consent was obtained for the surgery.   PROCEDURE IN DETAIL:  The patient was brought to the operative theater.  Once adequate anesthesia and preoperative antibiotics administered, the  patient was positioned in the fracture table supine.  The right  unaffected extremity was flexed and abducted out of way with bony  prominences padded.  The left lower extremity was placed in the traction  shoe.  Under fluoroscopic guidance, the fracture was evaluated and  deemed to be stable in an anatomic position.  At this point, identified  landmarks and prepped and draped the lateral aspect of the hip.  Under  fluoroscopic imaging, again landmarks were identified, and an incision  was made laterally and then placed an inferior central guide pin into  this inferior portion of femoral neck and the center portion from  anterior-posterior direction.  Then, using the Gatling gun pin guide, I  placed 2 screws proximally, 1 anterior and 1 posterior.  All these pins  were placed under fluoroscopic imaging and confirmed in their location.  I then measured the depth of them, drilled the outer cortex, and placed  the screws initially by power and then tightened by hand.   The screws were all hand tightened down to the lateral cortex with some  good further compression to the fracture site.  Following this, final  radiographs were obtained and the wound was irrigated with normal saline  solution.  I  reapproximated the lateral fascia using a 2-0 Vicryl on the subcu layer  and running 4-0 Monocryl.  I then cleaned the hip, dressed with Steri-  Strips and a sterile dressing.  She was brought to the recovery room,  extubated in stable condition, and tolerated the procedure  well.      Madlyn Frankel. Charlann Boxer, M.D.  Electronically Signed     MDO/MEDQ  D:  04/16/2008  T:  04/17/2008  Job:  [id number]

## 2011-04-21 NOTE — Assessment & Plan Note (Signed)
[facility]                         GASTROENTEROLOGY OFFICE NOTE   Nicole [name], Nicole [name]                   MRN:          [id number]  DATE:09/20/2007                            DOB:          26-Jan-1935    Nicole [name] is a very complicated 76 year old white female with chronic  diastolic congestive heart failure.  She recently has had increasing  shortness of breath and dyspnea at night with some regurgitation, is  referred for further GI evaluation.   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  She was treated with PPI therapy and really has been  fairly asymptomatic since that time, and from what I can ascertain, has  not been on chronic PPI therapy.  She has had recurrent problems with  congestive heart failure, has had numerous cardiac evaluations, and  recently saw Dr. Juanito Doom who increased her atenolol to 50 mg a day for  diastolic congestive heart failure.  About the same time she saw Dr.  Kriste Basque, who obtained a barium swallow, which showed a moderate-sized  hiatal hernia and acid reflux.  He placed her on Nexium 40 mg a day  along with metoclopramide 10 mg at bedtime.  She currently is  asymptomatic, but had terrible diarrhea with Reglan and has not taken  this medication.  She denies any dysphagia or hepatobiliary complaints.   Nicole [name] has chronic functional constipation and takes laxatives  approximately twice a week.  She has had no melena or hematochezia,  anorexia, or weight loss.  She follows a diabetic diet and has rather  brittle diabetes.  Is on varying doses of insulin several times a day.  She also has coronary artery disease and uses p.r.n. nitroglycerin.  Of  note, the patient also underwent cardiac catheterization and peripheral  vascular angiography, which showed 20% stenosis of her renal arteries  bilaterally,  but some minimal peripheral vascular disease, not requiring  any stents.  With her diabetes, the patient does have a diabetic  retinopathy and a history of peripheral neuropathy.  She gives no  symptomatology today consistent with gastroparesis.   PAST MEDICAL HISTORY:  Otherwise is complicated by degenerative  arthritis and severe spine disease.  She did have an angioplasty with  stent placement in 1998 and suffers from chronic essential hypertension.  She also has chronic thyroid dysfunction and is on thyroid replacement  therapy.  Recent laboratory data showed a mild anemia with a hemoglobin  of 13.3 with normal normocytic indices.   MEDICATIONS:  1. Lantus and Humalog insulin in divided doses as needed.  2. L-thyroxine 75 mcg a day.  3. Lasix 20 mg a day.  4. Atenolol 50 mg a day.  5. Felodipine 10 mg a day.  6. Quinapril 40 mg a day.  7. Crestor 20 mg a day.  8. Aspirin 81 mg a day.  9. Multivitamins.  10.Caltrate.  11.Vitamin D daily.  12.Amitriptyline 20 mg at bedtime for leg cramps.  13.Currently, Nexium 40 mg a day.  14.Tylenol arthritis p.r.n.  15.Nitroglycerin p.r.n.   She has had reactions in the past to [facility].   FAMILY HISTORY:  Remarkable for diabetes and atherosclerotic  cardiovascular disease in multiple family members, and breast cancer in  her mother.   SOCIAL HISTORY:  She is married and lives with her husband.  She has a  12th grade education.  Is retired from her job as an Government social research officer.  She does not smoke or use ethanol.   REVIEW OF SYSTEMS:  Positive for dyspnea and shortness of breath with  exertion.  The patient also has orthopnea and says she cannot walk from  the office to the car without getting short of breath.  Since increasing  her atenolol dose, she has had increasing peripheral edema.  She has  various arthralgias, but mostly low back pain, chronic insomnia, and  chronic fatigue.  She does, as mentioned above, have angina and chest   pain with too much exertion.  Review of systems is otherwise  noncontributory.   PHYSICAL EXAMINATION:  She is a healthy-appearing white female in no  acute distress.  Appearing her stated age.  She is 5 feet 4 inches tall and weighs 166 pounds.  Blood pressure  112/52, pulse 58 and regular.  There were no stigmata of chronic liver disease or thyromegaly noted.  CHEST:  Clear.  I could not appreciate rales or rhonchi.  She appeared  to be in a regular rhythm without murmurs, gallops, or rubs.  There was no definite organomegaly, masses, or abdominal tenderness.  Bowel sounds were normal.  There was trace peripheral edema, but no  pitting, phlebitis, or swollen joints.  Mental status was clear.  Rectal  exam was deferred.   ASSESSMENT:  1. Nicole [name] is convinced that her shortness of breath and      orthopnea is precipitated by her congestive heart failure.  She has      done better since Dr. Daleen Squibb has increased her dose of atenolol, also      since she has been on Nexium.  There is no question that she has      hiatal hernia, chronic gastroesophageal reflux disease, and needs      to be on chronic long-term PPI therapy.  She currently has no      dysphagia, odynophagia, or any other red flag alarm symptoms to      require endoscopy.  2. Chronic functional constipation with laxative dependency.  3. Hypertensive cardiovascular disease, coronary artery disease,      previous stenting, and chronic congestive heart failure.  4. Brittle insulin dependent diabetes.  5. Peripheral neuropathy and retinopathy associated with her diabetes.  6. Chronic thyroid dysfunction on Synthroid therapy.  7. Degenerative arthritis and chronic low back pain.   RECOMMENDATIONS:  1. I see no need for repeat endoscopy at this time.  2. I have switched her to AcipHex 20 mg a day since we have the large      number of samples of this medication.  I have also reviewed a      reflux regimen with her.  3.  Trial of MiraLax 8 ounces on a regular basis at bedtime.  4. Followup cardiology appointment with Dr. Daleen Squibb and Dr. Kriste Basque as      previously planned.     Vania Rea. Jarold Motto, MD, [facility], FAGA  Electronically Signed    DRP/MedQ  DD: 09/20/2007  DT: 09/20/2007  Job #: [id number]   cc:   Lorin Picket  Elayne Snare, MD  Jesse Sans. Wall, MD, [facility]

## 2011-04-21 NOTE — Assessment & Plan Note (Signed)
[facility]                            CARDIOLOGY OFFICE NOTE   Nicole [name], Nicole [name]                   MRN:          [id number]  DATE:08/24/2007                            DOB:          02/17/1935    This is a patient of Dr. Juanito Doom and Alroy Dust.  This is a 76-year-  old white female patient with a complicated past medical history who has  had coronary artery disease with stenting to AV circumflex back in 1998.  Repeat cath in 2001 showed nonobstructive coronary disease and she was  felt to have noncardiac chest pain at that time.  She had a sequential  proximal lesion of __________ /80% in the RCA, 30% circumflex, OM stent  was patent  and 40% LAD.  First diagonal was 50% - 60% with normal LV  function.  Followup Persantine Cardiolite was without ischemia.   The patient presents now with recurrent chest pain.  She said when she  was pushing a cart in [employer] she developed some shortness of breath  and a little bit of tightness, when she stops it goes away.  She also  gets a chest pain when she straps herself into her car seat.  She does  not know if it is because her arms are so weak or not.  She also has  trouble strapping her 21-year-old grandson in his carseat and develops an  aching.  She saw Dr. Kriste Basque last week for similar symptoms and awakening  with shortness of breath and she was told she has GE reflux.  We do not  have these records available to [place].  She has numerous other complaints  with back pain, bilateral leg pain that has been evaluated in the past  by Dr. Samule Ohm and felt to have no significant iliac disease to explain  her hip and side discomfort and has felt that it is not vascular  related.  Her most recent hemoglobin A1c was 8.7 indicating her diabetes  is not under control.  She is very difficult to get full accurate  history out of as she jumps from one symptom to the next.  After a long  discussion with her and her  husband of further evaluation with cardiac  catheterization versus stress testing the patient at this time prefers  to pursue a stress test rather than heart catheterization to further  evaluate her chest pain.   CURRENT MEDICINES:  1. Lantus.  2. Humalog as directed.  3. Levothyroxine 75 mcg daily.  4. Lasix 20 mg daily.  5. Atenolol 25 mg daily.  6. Felodipine 10 mg daily.  7. Quinapril 40 mg daily.  8. Crestor 20 mg daily.  9. Aspirin 81 mg daily.  10.Multivitamin daily.  11.Caltrate plus D daily.  12.Amitriptyline 20 mg nightly.  13.Nexium 40 mg daily.  14.Vitamin E daily.  15.__________ 10 mg nightly.   PHYSICAL EXAMINATION:  This is a 76 year old white female with a flat  affect.  Blood pressure 130/48, pulse 51, weight 162.  NECK:  Without JVD, HJR.  She does have bilateral carotid bruits left  greater than  right.  LUNGS:  Clear anterior, posterior and lateral.  HEART:  Regular rate and rhythm at 50 beats per minute, normal S1 and  S2, distant heart sounds.  No murmur, rub, bruit, thrill or heave noted.  ABDOMEN:  Obese, normoactive bowel sounds are heard throughout.  EXTREMITIES:  Without cyanosis, clubbing, edema.  She has present distal  pulses.   IMPRESSION:  1. Chest pain, difficult to discern.  Will proceed with adenosine      Cardiolite.  2. Coronary artery disease status post stenting to the      atrioventricular circumflex in 1998 with nonobstructive coronary      artery disease on followup cath in 2001, please see above dictation      for details.  3. Normal left ventricular function.  4. Hypertension.  5. Insulin-dependent diabetes mellitus, hemoglobin A1c 8.7  6. Hyperlipidemia.  7. Gastroesophageal reflux disease.  8. Chronic leg pain, not felt to be vascular.   PLAN:  With the patient's cardiac history and multiple risk factors she  certainly is at risk of progression of coronary artery disease but at  this time she prefers to proceed with stress  testing to further evaluate  her chest pain.  She is a difficult historian and has multiple  complaints and I feel this is a reasonable approach.  I have given her a  prescription for Imdur 30 mg daily and she is scheduled for stress  testing this Friday, 2 days from today.  EKG sinus bradycardia with  nonspecific ST changes.  I do not have an old EKG to compare it to,  there are no acute changes.      Jacolyn Reedy, PA-C  Electronically Signed      Doylene Canning. Ladona Ridgel, MD  Electronically Signed   ML/MedQ  DD: 08/24/2007  DT: 08/24/2007  Job #: [id number]

## 2011-04-24 NOTE — Discharge Summary (Signed)
[place]  Patient:    Nicole [name], Nicole [name]                   MRN: [id number] Adm. Date:  [id number] Attending:  Ronaldo Miyamoto Dictator:   Joellyn Rued, P.A.C. CC:         Lonzo Cloud. Kriste Basque, M.D. [facility]             Thomas C. Wall, M.D. [facility]             Guadelupe Sabin, M.D.                           Discharge Summary  DATE OF BIRTH:  07-Aug-1935  Dr. Riley Kill and Dr. Daleen Squibb.  SUMMARY OF HISTORY:  Ms. Hardgrave is a 76 year old white female who initially presented to [facility] after she was awakened with chest discomfort and shortness of breath. This is the first time she has had any problems since her last coronary artery evaluation several years ago (stenting).  She called EMS.  EMS gave her nitroglycerin and oxygen, and she was pain free at the time of her arrival in the emergency room.  LABORATORY DATA:  At [facility], CKs and troponin were negative for myocardial infarction.  Sodium 138, potassium 3.9, BUN 14, creatinine 0.8, glucose 195.  PT 12.7, PTT 28.  Hemoglobin 12.2, hematocrit 35.9, normal indices, platelets 200, WBC 4.9.  Chest x-ray did not show any active disease.  EKG showed normal sinus rhythm, nonspecific ST-T wave changes.  HOSPITAL COURSE:  Ms. Beldin was admitted to room 356.  Her records show that her last catheterization was June 29, 1997.  Her ejection fraction was 70%. She had a 30% LAD, 30 to 40% OM-1, 30% A-V circumflex, 70 to 80% distal OM-1, 20 to 40% posterior descending, 70 to 80% proximal and distal RCA.  She received a stent to the A-V circumflex reducing this from an 80 to 90% lesion to 0%.  Persantine Cardiolite in January 1999 showed an EF of 77%, no ischemia or scarring.  Overnight, she did not have any further chest discomfort. Enzymes and EKGs were negative for myocardial infarction.  Dr. Cecilie Kicks was called for the possibility of blood thinners in regards to cardiac catheterization.  He  stated that we had to do whatever evaluation we thought necessary and to call him if he had any problems.  Catheterization was performed on May 5 by Dr. Riley Kill.  According to his progress note, this showed sequential proximal lesion of 80/80% in the RCA.  The circumflex had a proximal 30% lesion and mid 40 to 50% lesion.  OM branch that had the stent showed a 40% lesion in the proximal part.  The remainder showed 20% lesion. The LAD had a 40% lesion just after the first diagonal, 40% mid lesion just after the second diagonal.  The first diagonal had a 50 to 60% lesion and normal left ventricular function.  A Persantine Cardiolite was performed on May 6 to evaluate for possibility of ischemia.  Her ejection fraction was 68%.  There were no signs of scarring or ischemia.  Thus, Dr. Daleen Squibb felt that she could be discharged home.  DISCHARGE DIAGNOSES: 1. Noncardiac chest discomfort. Catheterization shows nonobstructive coronary    artery disease with negative Persantine Cardiolite. 2. History of hypertension. 3. Insulin-dependent diabetes mellitus. 4. Hyperlipidemia. 5. Prior to her discharge, fasting lipids were performed.  This  showed a total    cholesterol of 229, triglycerides 160, HDL 40, LDL 157, with a ratio    of 5.7.  DISPOSITION:  Dr. Daleen Squibb felt that she could be discharged home.  DISCHARGE MEDICATIONS:  She was asked to continued her home medications except he did increase her atenolol to 25 mg q.d.  The remainder of her medications include: 1. Humulin 70/30, 20 to 30 units q.a.m., 15 units q.p.m. 2. Avandia 4 mg q.p.m. 3. Accupril 20 q.d. 4. Amitriptyline 10 mg q.h.s. 5. ______ XL 80 mg q.h.s. 6. Sublingual nitroglycerin as needed.  DISCHARGE INSTRUCTIONS:   She was advised no lifting, driving, sexual activity, or heavy exertion for two days.  If she had any problems with her catheterization site, she was asked to call immediately.  She was also asked to maintain a  low-salt, low cholesterol, ADA diet.  Dr. Daleen Squibb would like to see her in six months.  She was asked to call on approximately 4 to 5 to arrange this appointment.  She will keep her appointment with Dr. Kriste Basque this Friday; that is why this Discharge Summary is being dictated STAT. DD:  05/12/00 TD:  05/12/00 Job: [id number]

## 2011-04-24 NOTE — Cardiovascular Report (Signed)
[place]  Patient:    Nicole [name], Nicole [name]                   MRN: [id number] Proc. Date: 05/11/00 Adm. Date:  [id number] Disc. Date: [id number] Attending:  Ronaldo Miyamoto CC:         Lonzo Cloud. Kriste Basque, M.D. [facility]             Thomas C. Wall, M.D. [facility]             CV Laboratory             Luis Abed, M.D. [facility]                        Cardiac Catheterization  INDICATIONS:  The patient is a very pleasant 76 year old who underwent percutaneous stenting by Dr. Primitivo Gauze in 1998.  She has diabetes.  She has developed recurrent symptoms and the current study was done to access coronary anatomy.  Of note, the patient has a history of retinal bleeding from diabetes, and therefore she has not been on maintenance aspirin.  PROCEDURES: 1. Left heart catheterization. 2. Selective coronary angiography. 3. Selective left ventriculography.  DESCRIPTION OF PROCEDURE:  The procedure was performed from the right femoral artery using 6 French catheters.  There was minimal damping in the left main. We did give a little bit of intra-aortic nitroglycerin to reduce blood pressure.  We also used a JL 3.5 6 [place] guiding catheter to try to better opacify the left coronary artery.  There was not significant damping with this catheter.  HEMODYNAMIC DATA:  The initial central aortic pressure was 198/65, LV pressure 176/25.  No gradient on pullback across the aortic valve.  ANGIOGRAPHIC DATA:  LEFT VENTRICULOGRAPHY:  Ventriculography in the RAO projection reveals preserved global systolic function.  No segmental abnormality contraction are identified.  Ejection fraction is calculated at 76%.  The left main coronary artery demonstrates some mild irregularity but no high-grade area of disease.  The left anterior descending artery demonstrates about 40% narrowing beyond the origin of the diagonal.  The diagonal itself has about 50-60% ostial disease.  There is a  secondary of eccentric plaquing of 40% range beyond the septal perforator.  The LAD tapers off distally and has a typical distal diabetic appearance.  There is a small ramus intermedius that is free of critical disease.  The proximal circumflex demonstrates minimal luminal irregularities with about 30% narrowing beyond the origin of a tiny marginal branch.  There is a large marginal branch which is previously stented.  There is about 30-40% narrowing. at the entrance to the stent site but the stent demonstrates 20% or less in residual luminal narrowing.  The distal vessel again has some typical diabetic appearance but is without obvious focal stenosis.  The AV circumflex itself has about 40-50% narrowing in its takeoff at the ostium from the large marginal branch that is stented, and provides a posterolateral and a posterior descending branch.  Again, these are not significantly diseased but have a fairly typical diabetic appearance.  The right coronary artery is a nondominant vessel.  The right coronary artery demonstrates an 80% stenosis.  The RCA does provide a moderate sized marginal branch and a small vessel that provides a portion of the inferior wall.  The subclavian is widely patent as is the internal mammary.  CONCLUSIONS: 1. Preserved overall left ventricular function. 2. No evidence of high-grade re-stenosis at the  stent site. 3. Scattered coronary lesions as described above.  RECOMMENDATIONS: 1. Continuation of medical therapy. 2. Persantine Cardiolite to exclude areas of ischemia.  DISCUSSION:  While the LAD does not appear to be tight, there could be luminal disease based on the so called normal vessel size.  However, at the present time I would not be inclined to do any kind of intervention on the above findings. DD:  05/11/00 TD:  05/13/00 Job: [id number]

## 2011-04-24 NOTE — Op Note (Signed)
[place]  Patient:    Nicole [name], Nicole [name] Visit Number: [id number] MRN: [id number]          Service Type: DSU Location: [facility] 2864 [id number] Attending Physician:  Ivor Messier Dictated by:   Guadelupe Sabin, M.D. Proc. Date: 11/05/99 Admit Date:  11/04/2001 Discharge Date: 11/04/2001   CC:         Lorin Picket M. Kriste Basque, M.D. [facility]   Operative Report  CHIEF COMPLAINT: This was a planned outpatient surgical readmission of this 76 year old white female, insulin-dependent diabetic, admitted for cataract implant surgery of the right eye.  HISTORY OF PRESENT ILLNESS: This patient has a long history of insulin-dependent diabetes mellitus and secondary complications of progressive diabetic retinopathy, proliferative type, and later cataract formation.  The patient has been previously admitted dating back to January 01, 1995 for vitrectomy surgery of left eye, June 13, 1996 laser panretinal photocoagulation of right eye, June 04, 1998 additional laser photocoagulation of left eye, April 01, 1999 cataract implant surgery of left eye.  Following these procedures the patient has done well with the operated left eye.  Recently the visual acuity in the right eye has deteriorated to 20/200 and posterior subcapsular cataract noted.  The patient has elected to proceed with surgery. She signed an informed consent and arrangements were made for her outpatient admission at this time.  PAST MEDICAL HISTORY: The patient continues under the care of her regular physician, Dr. Alroy Dust, and is felt to be satisfactory condition for the proposed surgery.  REVIEW OF SYSTEMS: No cardiorespiratory complaints.  PHYSICAL EXAMINATION:  VITAL SIGNS: Blood pressure 158/66, temperature 97.9 degrees, heart rate 61, respirations 16.  GENERAL APPEARANCE: The patient is a pleasant, well-developed, well-nourished 76 year old white female, in no acute distress.  HEENT: Eyes,  visual acuity as noted above 20/200 right eye, 20/25 left eye. Applanation tenometry 20 mm each eye.  External ocular and slit-lamp examination, posterior subcapsular cataract right eye, pseudophakia left eye. Detailed fundus examination, the vitreous is clear, the retina is attached with old panretinal laser photocoagulation.  No active proliferative retinopathy is seen.  Macular area clear without definite age-related macular degeneration.  CHEST: Lungs clear to percussion and auscultation.  HEART: Normal sinus rhythm.  No cardiomegaly.  No murmurs.  ABDOMEN: Negative.  EXTREMITIES: Negative.  ADMISSION DIAGNOSES:  1. Posterior subcapsular cataract, right eye.  2. Pseudophakia, left eye.  3. Proliferative diabetic retinopathy, inactive, both eyes.  SURGICAL PLAN: Cataract implant surgery, right eye. Dictated by:   Guadelupe Sabin, M.D. Attending Physician:  Ivor Messier DD:  11/29/01 TD:  12/01/01 Job: [id number]

## 2011-04-24 NOTE — Op Note (Signed)
[place]  Patient:    Nicole [name], Nicole [name] Visit Number: [id number] MRN: [id number]          Service Type: DSU Location: [facility] [id number] Attending Physician:  Ivor Messier Dictated by:   Guadelupe Sabin, M.D. Proc. Date: 11/04/01 Admit Date:  11/04/2001 Discharge Date: 11/04/2001   CC:         Lorin Picket M. Kriste Basque, M.D. [facility]   Operative Report  PREOPERATIVE DIAGNOSES:  1. Posterior subcapsular cataract, right eye.  2. Proliferative retinopathy, right eye, inactive.  POSTOPERATIVE DIAGNOSES:  1. Posterior subcapsular cataract, right eye.  2. Proliferative retinopathy, right eye, inactive.  OPERATION/PROCEDURE: Planned extracapsular cataract extraction, phacoemulsification, primary insertion of posterior chamber intraocular lens implant.  SURGEON: Guadelupe Sabin, M.D.  ASSISTANT: Nurse.  ANESTHESIA: Local 4% xylocaine, 0.75% Marcaine retrobulbar block, topical tetracaine, intraocular xylocaine.  Anesthesia standby required in this 77 year old diabetic.  The patient was given sodium pentothal or Ditropan during the period of retrobulbar injection.  DESCRIPTION OF PROCEDURE: After the patient was prepped and draped a lid speculum was inserted in the right eye.  The eye was turned downward and a superior rectus traction suture placed.  A peritomy was performed adjacent to the limbus from the 11 oclock to one oclock position.  The corneoscleral junction was cleaned and a corneoscleral groove made with a 45 degree Superblade.  The anterior chamber was then entered with the 2.5 mm diamond keratome at the 12 oclock position and the 15 degree blade at the 2:30 position. Using a bent 26 gauge needle on a Healon syringe a circular capsulorrhexis was begun and then completed with the Grabow forceps. Hydrodissection and hydrodelineation were performed using 1% xylocaine.  The 30 degree phacoemulsification tip was then inserted, with slow  controlled emulsification of the lens nucleus.  Total ultrasonic time, 45 seconds. Average power level 17%.  Total amount of fluid used, 40 cc.  Following removal of the posterior subcapsular plaque with the irrigation-aspiration tip the posterior capsule appeared intact with a brilliant red fundus reflex.  It was therefore elected to insert an EZE-60 posterior chamber polymethylmethacrylate lens in this diabetic patient, lens power +23.00.  This was inserted with the Shepard forceps after the incision had been enlarged to 6 mm.  The lens was placed initially in the anterior chamber and then centered into the capsular bag using the Lister lens rotator.  The lens appeared to be well centered.  The Healon which had been used during the procedure was then aspirated and replaced with balanced salt solution and Miochol ophthalmic solution.  Since the incision had been enlarged it was elected to place three 10-0 interrupted nylon sutures across it at the 12 oclock position.  This closed the incision securely.  The 2:30 oclock position did not require any sutures.  Maxitrol ointment was instilled in the conjunctival cul-de-sac and a light patch and protective shield applied to the operated right eye.  Duration of procedure and anesthesia administration, 45 minutes.  The patient tolerated the procedure well in general and left the operating room for the recovery room in good condition. Dictated by:   Guadelupe Sabin, M.D. Attending Physician:  Ivor Messier DD:  11/29/01 TD:  12/01/01 Job: [id number]

## 2011-04-24 NOTE — Assessment & Plan Note (Signed)
[facility]                              CARDIOLOGY OFFICE NOTE   Nicole [name], Nicole [name]                   MRN:          [id number]  DATE:06/16/2006                            DOB:          17-Jun-1935    CARDIOLOGY NOTE:  Nicole [name] returns today for management of the following  issues:  1.  Coronary Artery Disease with a negative stress Cardiolite July 2005.  ES      70%.  She is having no symptoms of ischemia.  2.  Hypertension.  3.  Type II diabetes followed by Nicole [name].  Hemoglobin A1C 7.7 recently.  4.  Peripheral vascular disease, followed by Nicole [name], felt to be stable.      ABI's and his visit September, 2007.  5.  Hyperlipidemia.  Lipids at goal last week by Nicole [name], on Crestor 20.  6.  Cerebral vascular disease with non-obstructive plaque a year ago      bilaterally.   MEDICATIONS:  Unchanged except Skelaxin has been added for possible  fibromyalgia per Nicole [name].   EXAMINATION:  VITAL SIGNS:  Blood pressure 138/56. Pulse 60 and regular.  Weight is 161.  HEENT:  Carotids are full with bilateral bruits.  There is no thyromegaly.  Trachea is midline.  LUNGS:  Clear.  HEART:  Regular rate and rhythm.  There is no murmur of a gallop.  ABDOMEN:  Soft, good bowel sounds.  EXTREMITIES:  No signs of clubbing or edema.  Pulses were reduced right  greater than left.   EKG is stable.   ASSESSMENT AND PLAN:  Nicole [name] is stable.  She is a high risk for a  vascular event.  She'll do carotid Doppler's.  We will schedule those and  see her back in a year.  I made no changes in her program.                               Nicole Fus C. Daleen Squibb, MD, [facility]    TCW/MedQ  DD:  06/16/2006  DT:  06/16/2006  Job #:  [id number]   cc:   Nicole Cloud. Kriste Basque, MD

## 2011-04-24 NOTE — Cardiovascular Report (Signed)
[name], [name]            ACCOUNT NO.:  [id number]   MEDICAL RECORD NO.:  [id number]          PATIENT TYPE:  AMB   LOCATION:  SDS                          FACILITY:  [facility]   PHYSICIAN:  Salvadore Farber, MD  DATE OF BIRTH:  31-Dec-1934   DATE OF PROCEDURE:  03/02/2007  DATE OF DISCHARGE:                            CARDIAC CATHETERIZATION   PROCEDURE:  Abdominal aortography with bilateral lower extremity runoff.   INDICATIONS:  Nicole [name] is a 76 year old woman with longstanding  peripheral arterial disease and leg pain.  I have previously felt that  her leg pain was largely unrelated to her peripheral disease.  In 2004  an angiogram demonstrated no significant stenoses within the left leg  above the knee.  However, her symptoms were worse on that side.   Over the past year she has become increasingly troubled by exertional  left hip and thigh discomfort.  She can walk well under 100 yards before  the onset of symptoms.  Total brachial indices are 0.31 on the right and  0.39 on the left.  Left ABI could not be calculated due to medial  calcification.  Due to her progression of symptoms we recommended repeat  angiography for definitive exclusion of aortoiliac disease as a culprit  for her symptoms.   PROCEDURAL TECHNIQUE:  Informed consent was obtained.  Under 1%  lidocaine local anesthesia, a 5-French sheath was placed in the right  common femoral artery using the modified Seldinger technique.  A pigtail  catheter was placed in the suprarenal abdominal aorta.  Abdominal  aortography was performed by power injection.  The pigtail catheter was  then pulled to the infrarenal abdominal aorta and abdominal aortography  with bilateral lower extremity runoff performed using power injection  and step-table technique.  The patient was then transferred to the  holding room in stable condition having tolerated the procedure well.   COMPLICATIONS:  None.   FINDINGS:  1. Abdominal  aorta:  Mild plaque without stenosis or aneurysm.  2. Renal arteries:  Ostial 20% stenosis bilaterally.  There are single      vessels bilaterally.  3. Right leg:  Minimal disease in the common iliac.  There is a normal      external iliac.  The internal iliac is widely patent.  Common      femoral profunda are normal.  The SFA is diffusely diseased along      its course with up to 90% stenosis.  There is a 40% stenosis of the      distal popliteal artery.  There is three-vessel runoff to the foot.  4. Left leg:  Minimally diseased common iliac and external iliac      artery.  The common femoral has a 30% stenosis proximally.  The      internal iliac is patent.  Profunda artery is normal.  The SFA has      diffuse calcification with blood approximately 40% stenosis at the      adductor canal.  There is three-vessel runoff to the foot.      However, the anterior tibial is diffusely and  severely diseased      with up to 90% stenosis.  The posterior tibial is large and      generally free of disease except just below the ankle where there      is a focal 80% stenosis.   IMPRESSION/RECOMMENDATION:  No significant iliac disease to explain her  hip and thigh discomfort.  I therefore think this is not related to her  vascular disease.  Will leave further evaluation to Dr. Kriste Basque.      Salvadore Farber, MD  Electronically Signed     WED/MEDQ  D:  03/02/2007  T:  03/02/2007  Job:  [id number]   cc:   Lonzo Cloud. Kriste Basque, MD

## 2011-04-24 NOTE — Discharge Summary (Signed)
Nicole [name]            ACCOUNT NO.:  [id number]   MEDICAL RECORD NO.:  [id number]          PATIENT TYPE:  INP   LOCATION:  5005                         FACILITY:  [facility]   PHYSICIAN:  Sharlet Salina [address], [place]   DATE OF BIRTH:  1935/06/24   DATE OF ADMISSION:  04/15/2008  DATE OF DISCHARGE:  04/20/2008                               DISCHARGE SUMMARY   ADMITTING DIAGNOSES:  1. Osteopenia.  2. Hypertension.  3. Coronary artery disease.  4. Congestive heart failure.  5. Cerebrovascular disease.  6. Peripheral vascular disease.  7. Hypercholesteremia.  8. Diabetes.  9. Hypothyroidism.  10.Anemia of chronic disease.   DISCHARGE DIAGNOSES:  1. Osteopenia.  2. Hypertension.  3. Coronary artery disease.  4. Congestive heart failure.  5. Cerebrovascular disease.  6. Peripheral vascular disease.  7. Hypercholesteremia.  8. Diabetes.  9. Hypothyroidism.  10.Anemia of chronic disease.   HISTORY OF PRESENT ILLNESS:  This is a 76 year old female who [place]  [place] [place], had immediate left hip pain, unable to bare weight.  She denies any syncope, chest pain, or loss of consciousness after the  fall.  She was brought to the emergency department where she was  examined.  X-rays revealed a left femoral neck fracture.  She was seen  originally by Dr. Melvyn Novas.  Care was transferred to Dr. Charlann Boxer for  definitive surgical management.  She was cleared presurgically by her  primary care and cardiologist.   CONSULTANTS:  Lonzo Cloud. Kriste Basque, MD for cardiology clearance and medical  clearance for surgery.   PROCEDURE:  Left percutaneous screw fixation by Dr. Durene Romans.   LABORATORY DATA:  On admission, CBC was 12.2 and 36.3 of  hemoglobin/hematocrit respectively with platelets of 215 tracked  throughout her course of stay.  At discharge, hemoglobin 9.9, hematocrit  28.6, and platelets 154.  White cell and differential all within normal  limits.  Chemistry on admission, sodium  135, potassium 3.7, and glucose  256.  At the time of discharge, sodium 136, potassium 4.4, glucose 125,  and creatinine was 1.18.  Her GFR on admission was greater than 60 and  at the time of discharge it was 45, and her calcium was 8.4.  Her GI  workup showed her total protein to be 5.8, albumin 2.8, and total bili  2.8.  Hemoglobin A1c was 9.  Mean plasma glucose 243 at that time.  Lipid profile, triglycerides 78, HDL was 40, and LDL was 52.  TSH 2.975.   EKG, not found an interpreter.  Last cardiac evaluation was October 2008  with a Myoview showing ejection fraction of 76%.   Radiology:  Chest 2-view showed mild interstitial pulmonary edema.   HOSPITAL COURSE:  The patient was admitted through emergency department  to our orthopedic service.  She was cleared medically prior to surgery.  Surgery was performed on Apr 16, 2008.  She was started on some insulin.  This helped to control her glucose as she had A1c of 9.  On Apr 17, 2008, she had decrease hip pain, but she had limited movement.  She was  afebrile.  Hip was dry.  She was in PT/OT, partial weightbearing 50%.  Dr. Kriste Basque followed her medically, managed her medical issues.  PT, she  made good progress over Apr 17, 2008, and Apr 18, 2008, afebrile.  Dressing was dry with plans for home health versus SNF.  She had a  little bit of edema as well as some shortness of breath on Apr 18, 2008.  Her O2 sats went down a little bit.  She was given a nebulizer, this  helped tremendously.  Seen on Apr 19, 2008, she was complaining of some  reflux-type symptoms and some chest fullness.  Otherwise, she was  afebrile.  Left hip was dry and she was making good progress with  physical therapy.  Dr. Kriste Basque increased her Lasix up to 40 mg a day,  which we continued at the time of discharge.  Made good progress on Apr 19, 2008, and on Apr 20, 2008, no events and se was doing okay, her GI  was feeling better.  Chest fullness was decreased.  Left  hip was dry.  She was afebrile and ready for discharge home with home health care PT.   DISCHARGE DISPOSITION:  Discharged home in stable and improved  condition.   DISCHARGE DIET:  Regular as tolerated by the patient.  She is diabetic.   DISCHARGE WOUND CARE:  Keep dry.   DISCHARGE PHYSICAL THERAPY:  She is weightbearing as tolerated with the  use of a rolling walker.   DISCHARGE MEDICATIONS:  1. Vicodin 5/325, 1-2 p.o. q.4-6 h. p.r.n. pain.  2. Robaxin 500 mg p.o. q.6 h.  3. Lovenox 40 mg subcu q.24 x10 days, then transfer to aspirin 325 mg      p.o. daily x4 weeks after Lovenox completed.  4. Colace 100 mg p.o. daily.  5. MiraLax 17 g p.o. daily.  6. Lantus insulin 30 units subcu q.a.m.  7. NovoLog sliding scale.  8. Lasix 40 mg p.o. q.a.m. x2 days and then Lasix 20 mg p.o. daily.  9. Nitrostat sublingual p.r.n. chest pain.  10.Atenolol 25 mg 1 p.o. q.a.m.  11.Crestor 20 mg 1 p.o. nightly.  12.Levothyroxine 0.75 mcg daily q.a.m.  13.Felodipine 10 mg p.o. q.a.m.  14.Prilosec 40 mg p.o. q.a.m.  15.Quinapril 40 mg p.o. q.a.m.   DISCHARGE FOLLOWUP:  1. Follow up with Dr. Charlann Boxer on Apr 29, 2008, and then in 10 days.  2. Follow up with Dr. Kriste Basque in 2 weeks.           ______________________________  Nicole [name] [place], [place]     BLM/MEDQ  D:  05/31/2008  T:  05/31/2008  Job:  [id number]   cc:   Lonzo Cloud. Kriste Basque, MD

## 2011-04-24 NOTE — Op Note (Signed)
Nicole [name], Nicole [name]                      ACCOUNT NO.:  [id number]   MEDICAL RECORD NO.:  [id number]                   PATIENT TYPE:  OIB   LOCATION:  2856                                 FACILITY:  [facility]   PHYSICIAN:  Veneda Melter, M.D.                   DATE OF BIRTH:  1935-09-11   DATE OF PROCEDURE:  07/27/2003  DATE OF DISCHARGE:                                 OPERATIVE REPORT   PROCEDURE:  1. Abdominal aortogram.  2. Bilateral lower extremity angiogram.  3. Selective angiography, right superficial femoral artery, right and left     iliac arteries.  4. Measurement of gradient right and left iliac arteries.   DIAGNOSES:  1. Peripheral vascular disease.  2. Claudication.   INDICATIONS FOR PROCEDURE:  Nicole [name] is a 76 year old white female with  coronary artery disease, hypertension, hypothyroidism, dyslipidemia,  diabetes mellitus and peripheral vascular disease who presents for  assessment of claudication. The patient has had bilateral hip and calf  discomfort  for a number of years. She feels that this has been increasing  in frequency and severity recently. Ankle-brachial indices on May 31, 2003,  was 0.93  on the right and 0.93 on the left. She has also had left lower  extremity edema with lower extremity ultrasound showing no evidence of  venous incompetence or DVT. Due to persistence of symptoms, she presents for  further assessment.   DESCRIPTION OF PROCEDURE:  Informed consent was obtained. The patient was  brought to the peripheral vascular laboratory  and a 5 French sheath was  placed in the left femoral artery using modified Seldinger technique. A 5  French pigtail catheter was advanced into the descending aorta and an  abdominal  aortogram was performed using power injection of contrast. The  pigtail catheter was brought back to the level of the bifurcation. A  bilateral lower extremity angiogram was performed using power injection  contrast and  digital subtraction angiography.   Subsequently an IM catheter was introduced and a Wholey wire was placed  across the aortic bifurcation. An endhole catheter was then introduced and  placed at the origin of the right SFA and selective angiography of the right  SFA performed. Using an endhole catheter, a transonic gradient across the  right and left iliac arteries was performed. The IM catheter was then  reintroduced and further  angiography  was performed of the right iliac  artery. Angiography of the left iliac artery was performed in various  projections using the manual injection of contrast through the left femoral  sheath.   At the termination of the case the catheters and sheaths were removed.  Manual pressure was  applied until adequate hemostasis was achieved. The  patient tolerated the procedure well. She was transferred to the floor in  stable condition.   FINDINGS:  1. Abdominal aorta is of normal caliber. There is mild taper in the inferior  segment above  the iliac bifurcation with calcification noted at the     level of the bifurcation. The renal artery patent bilaterally  with mild     disease of 30%.  2. Right lower extremity.  The right iliac artery has evidence of a tibial     stenosis at its origin of 50%. There is heavy calcification. Trans     stenotic gradient is approximately 20 mmHg. The distal common iliac and     external iliac arteries have mild disease. The common femoral artery has     mild disease as well. The superficial femoral artery is patent. It is a     small caliber vessel. There is diffuse disease of up to 70% in the     midsection. The distal  SVA and popliteal artery have mild disease of     20%. The trifurcation vessels are patent, although there is moderate     diffuse disease of 50% to 60% in all 3 vessels. The anterior tibial and     peroneal arteries extend to the ankle.  3. Left lower extremity. The left iliac artery is patent. It is  heavily     calcified with moderate narrowing of 30% in its proximal segment and a     trans stenotic gradient of approximately 10 mmHg. The common femoral     artery and the distal external iliac artery have moderate disease of 30%.     The superficial femoral artery is a small caliber vessel that has     moderate disease of 50% in the midsection. The popliteal artery is patent     with mild disease. The trifurcation vessels area again with the anterior     tibial and peroneal arteries extending to the ankle. There is moderate     disease of 60% in the tibial peroneal trunk.   ASSESSMENT AND PLAN:  Nicole [name] is a 76 year old female with moderate  peripheral vascular disease. I have discussed  the treatment options with  the patient including  continued medical therapy with aggressive attempt to  increase ambulation as well as percutaneous intervention of the right iliac  and superficial femoral artery. As the patient has bilateral symptoms, these  appear somewhat out of proportion on the left side to her anatomy.   I would thus recommend continued medical therapy with aspirin, Plavix and  Pletal and I have encouraged her to improve her peripheral circulation by  walking for 1 hour on a daily basis. I have also encourage the patient to  pay closer attention to  her diet to perhaps help promote some weight loss.  Should the patient have refractory pains or worsening symptoms on the right  side, percutaneous intervention may be considered.                                               Veneda Melter, M.D.    NG/MEDQ  D:  07/27/2003  T:  07/28/2003  Job:  [id number]   cc:   Lonzo Cloud. Kriste Basque, M.D. [facility]   Maisie Fus C. Wall, M.D.

## 2011-04-24 NOTE — Progress Notes (Signed)
[facility]                        PERIPHERAL VASCULAR OFFICE NOTE   Nicole [name], Nicole [name]                   MRN:          [id number]  DATE:02/23/2007                            DOB:          1935-04-30    Primary care physician:  Lonzo Cloud. Kriste Basque, MD   CHIEF COMPLAINT:  Claudication.   HISTORY OF PRESENT ILLNESS:  Nicole [name] is a 76 year old woman with  longstanding peripheral arterial disease and leg pain.  We have  previously felt that her leg pain was largely unrelated to her  peripheral disease.  In 2004 she had predominantly left leg pain with  exertion.  ABI was mildly abnormal.  We took her to angiogram and found  no severe stenoses in the left system.  We therefore felt that the  lesser discomfort on the right was also most likely unrelated to her  vascular disease.  Nonetheless, she remains troubled by leg pain with  exertion.  Specifically, she has calf, thigh and hip discomfort with  exertion.  The left hip and thigh are the worst.  She says she can walk  well under 100 yards before the onset of symptoms.  She has been unable  to be compliant with any exercise therapy due to this.   She recently had repeat ABIs done (January 11, 2007).  ABI on the right  was 0.65 and on the left was no calculated due to medial calcification.  Toe brachial index was 0.31 on the right and 0.29 on the left.  She has  not had any rest pain.  She did have what sounds like an infection of  her right great toenail, which has been under the care of Dr. Wynelle Cleveland.  This has healed nicely.   PAST MEDICAL HISTORY:  1. Diabetes mellitus, insulin-requiring, since age 39.  2. Hypertension.  3. Hypercholesterolemia.  4. Diabetic retinopathy.   ALLERGIES:  MOTRIN.   CURRENT MEDICATIONS:  1. Lantus insulin.  2. Humalog insulin.  3. Levothyroxine 75 mcg daily.  4. Lasix 20 mg daily.  5. Atenolol 25 mg daily.  6. Felodipine 10 mg daily.  7. Quinapril 40 mg  daily.  8. Crestor 20 mg daily.  9. Aspirin 81 mg daily.  10.Multivitamin.  11.Caltrate Plus D.  12.Amitriptyline 20 mg q.h.s.   SOCIAL HISTORY:  Minimal tobacco abuse many decades ago.  Denies alcohol  use.   FAMILY HISTORY:  Father died at 71 of coronary disease.  Mother died at  age 52 of diabetes with also coronary disease.  A son has advanced  diabetes and has undergone renal and pancreatic transplants.   REVIEW OF SYSTEMS:  Negative in detail except as above with the  exception of right shoulder discomfort chronically.   PHYSICAL EXAMINATION:  GENERAL:  A well-appearing woman in no distress.  VITAL SIGNS:  Heart rate 65, blood pressure 130/53 on the left, 143/61  on the right.  She weighs 159 pounds.  NECK:  She has no jugular venous distention, thyromegaly or  lymphadenopathy.  HEENT:  Normal.  SKIN:  Remarkable for some skin changes of chronic venous stasis in the  lower  calves but without edema.  There are no ulcerations on her feet.  There are varicosities on both thighs.  There is some dependent rubor on  the feet.  MUSCULOSKELETAL:  Normal with the exception of pain with motion of the  right upper arm.  CHEST:  Respiratory effort is normal.  LUNGS:  Clear to auscultation.  CARDIAC:  She has a nondisplaced point of maximum cardiac impulse.  There is a regular rate and rhythm without murmur, rub, or gallop.  ABDOMEN:  Soft, nondistended, nontender.  There is no  hepatosplenomegaly.  Bowel sounds are normal.  There is no midline  pulsatile mass.  EXTREMITIES:  Warm without clubbing, cyanosis, edema, or ulceration.  VASCULAR:  Carotid pulses 2+ bilaterally with bilateral bruits including  a very high-pitched one on the right.  Femoral pulses are 1+ on the  right and 2+ on the left.  Popliteal, DP, and PT pulses are not palpable  on either side.   IMPRESSION/RECOMMENDATIONS:  1. Leg pain:  Symptoms are substantially worse and clearly very      lifestyle-limiting.   Toe brachial indices are profoundly abnormal.      However, it is not clear to me that her most limiting pain, that of      her left hip and thigh, is related to arterial insufficiency.  She      strongly wishes that anything that can be done, be done.  We will      therefore proceed to repeat angiography.  Will plan on percutaneous      revascularization of the left leg.  Thus, we will plan access on      the right.  We will initiate Plavix in addition to her aspirin.  2. Carotid bruits:  The right is quite high-pitched.  She did not have      a severe stenosis there in the fall; however, we will recheck      duplex.  3. Diabetes mellitus:  Take half of the usual Lantus the evening      before the procedure.  4. Hypertension:  Continue the usual medicines.    Salvadore Farber, MD  Electronically Signed   WED/MedQ  DD: 02/23/2007  DT: 02/23/2007  Job #: [id number]   cc:   Lonzo Cloud. Kriste Basque, MD

## 2011-04-28 ENCOUNTER — Encounter: Payer: Self-pay | Admitting: Cardiology

## 2011-04-29 ENCOUNTER — Ambulatory Visit (INDEPENDENT_AMBULATORY_CARE_PROVIDER_SITE_OTHER): Payer: Medicare Other | Admitting: Cardiology

## 2011-04-29 ENCOUNTER — Encounter: Payer: Self-pay | Admitting: Cardiology

## 2011-04-29 VITALS — BP 167/57 | HR 69 | Ht 64.0 in | Wt 142.0 lb

## 2011-04-29 DIAGNOSIS — I2510 Atherosclerotic heart disease of native coronary artery without angina pectoris: Secondary | ICD-10-CM

## 2011-04-29 DIAGNOSIS — E119 Type 2 diabetes mellitus without complications: Secondary | ICD-10-CM

## 2011-04-29 DIAGNOSIS — I739 Peripheral vascular disease, unspecified: Secondary | ICD-10-CM

## 2011-04-29 DIAGNOSIS — E7800 Pure hypercholesterolemia, unspecified: Secondary | ICD-10-CM

## 2011-04-29 LAB — BASIC METABOLIC PANEL
BUN: 18 mg/dL (ref 6–23)
Chloride: 104 mEq/L (ref 96–112)
GFR: 64.21 mL/min (ref >60.00)
Potassium: 4.3 mEq/L (ref 3.5–5.1)

## 2011-04-29 LAB — CBC WITH DIFFERENTIAL/PLATELET
Basophils Relative: 0.4 % (ref 0.0–3.0)
Eosinophils Relative: 1.7 % (ref 0.0–5.0)
HCT: 33.5 % — ABNORMAL LOW (ref 36.0–46.0)
Lymphs Abs: 1.2 10*3/uL (ref 0.7–4.0)
MCV: 91.1 fl (ref 78.0–100.0)
Monocytes Absolute: 0.5 10*3/uL (ref 0.1–1.0)
Monocytes Relative: 11 % (ref 3.0–12.0)
Neutrophils Relative %: 59.2 % (ref 43.0–77.0)
Platelets: 193 10*3/uL (ref 150.0–400.0)
RBC: 3.67 Mil/uL — ABNORMAL LOW (ref 3.87–5.11)
WBC: 4.3 10*3/uL — ABNORMAL LOW (ref 4.5–10.5)

## 2011-04-29 LAB — PROTIME-INR
INR: 1 ratio (ref 0.8–1.0)
Prothrombin Time: 11.6 s (ref 10.2–12.4)

## 2011-04-29 MED ORDER — PANTOPRAZOLE SODIUM 40 MG PO TBEC: 40.0000 mg | DELAYED_RELEASE_TABLET | Freq: Every day | ORAL | Status: DC

## 2011-04-29 MED ORDER — CLOPIDOGREL BISULFATE 75 MG PO TABS: 75.0000 mg | ORAL_TABLET | Freq: Every day | ORAL | Status: DC

## 2011-04-29 NOTE — Assessment & Plan Note (Signed)
bifem bruits, but palpable pulses.  Will use femoral approach.

## 2011-04-29 NOTE — Assessment & Plan Note (Signed)
Recheck CBC at present time, and reassess.

## 2011-04-29 NOTE — Assessment & Plan Note (Signed)
Long discussion.  See cath report in echart, and prior notes.  She will get repeat labs today, and we will reassess.

## 2011-04-29 NOTE — Progress Notes (Signed)
HPI:  Patient is seen in follow up.   She remains asymptomatic.  Case discussed at length with Dr. Daleen Squibb, and with patient.  She has no current symptoms, but a very worrisome lesion in the prox CFX.  She is fragile.  We have discussed all options with patient, and leaning toward an FFR, and IVUS prior to consideration of PCI.  If pos, we would lean toward doing this given the size of vascular territory.  See prior notes.   Current Outpatient Prescriptions  Medication Sig Dispense Refill  . acetaminophen (TYLENOL) 500 MG tablet Take 500 mg by mouth every 6 (six) hours as needed.        Marland Kitchen amitriptyline (ELAVIL) 10 MG tablet Take 10 mg by mouth at bedtime as needed.       . Ascorbic Acid (VITAMIN C) 500 MG tablet Take 500 mg by mouth daily.        Marland Kitchen aspirin 81 MG tablet Take 81 mg by mouth daily.        . Calcium Carbonate-Vitamin D (CALTRATE 600+D) 600-400 MG-UNIT per chew tablet Chew 1 tablet by mouth daily.        . carvedilol (COREG) 6.25 MG tablet Take 1 tablet (6.25 mg total) by mouth 2 (two) times daily with a meal.  60 tablet  11  . Cholecalciferol (VITAMIN D) 1000 UNITS capsule Take 1,000 Units by mouth daily.        . CRESTOR 20 MG tablet TAKE 1 TABLET DAILY  90 tablet  2  . felodipine (PLENDIL) 10 MG 24 hr tablet Take 10 mg by mouth daily.        . ferrous sulfate 325 (65 FE) MG EC tablet Take 325 mg by mouth 2 (two) times daily.        . furosemide (LASIX) 20 MG tablet TAKE 1 TABLET ONCE A DAY  90 tablet  2  . insulin glargine (LANTUS) 100 UNIT/ML injection Inject 40 Units into the skin at bedtime.        . insulin lispro (HUMALOG) 100 UNIT/ML injection Inject 5 to 15 units subcutaneously as needed before meals  10 mL  4  . levothyroxine (SYNTHROID, LEVOTHROID) 75 MCG tablet Take 75 mcg by mouth daily.        . methocarbamol (ROBAXIN) 500 MG tablet Take 1 tablet (500 mg total) by mouth 3 (three) times daily as needed. For muscle spasms as needed  90 tablet  5  . multivitamin (THERAGRAN)  per tablet Take 1 tablet by mouth daily.        . nitroGLYCERIN (NITROSTAT) 0.4 MG SL tablet Place 0.4 mg under the tongue every 5 (five) minutes as needed. May repeat up to 3 doses       . omeprazole (PRILOSEC) 20 MG capsule Take one tablet by mouth two times daily       . quinapril (ACCUPRIL) 40 MG tablet Take 40 mg by mouth daily.        . traMADol (ULTRAM) 50 MG tablet Take 50 mg by mouth every 6 (six) hours as needed.        Marland Kitchen DISCONTD: cetirizine (ZYRTEC) 10 MG tablet Take 10 mg by mouth daily as needed.          Allergies  Allergen Reactions  . Ibuprofen     REACTION: edema---thyroid problems    Past Medical History  Diagnosis Date  . Retinopathy   . Hypertension   . CHF (congestive heart failure)   .  CAD (coronary artery disease)   . Cerebrovascular disease   . Peripheral vascular disease   . Hypercholesterolemia   . Diabetes mellitus   . Hypothyroidism   . Hiatal hernia   . Degenerative joint disease   . Osteoporosis   . Vitamin D deficiency   . Anemia of chronic disease     Past Surgical History  Procedure Date  . Abdominal aortogram 07/27/2003    by Dr. Maricela Curet  . Bilateral lower extremity angiogram 07/27/2003    by Dr. Maricela Curet  . Selective angiography,right superficial femoral artery, right and left iliac arteries 07/27/2003    by Dr. Maricela Curet  . Measurement of gradient right and left iliac arteries 07/27/2003    by Dr. Maricela Curet  . Bilateral cataract sugery 2000 and 2002    by Dr. Cecilie Kicks  . Repair left hip fracture 04/2008    by Dr. Charlann Boxer    No family history on file.  History   Social History  . Marital Status: Married    Spouse Name: N/A    Number of Children: 2  . Years of Education: N/A   Occupational History  . retired    Social History Main Topics  . Smoking status: Former Smoker    Types: Cigarettes    Quit date: 12/07/1974  . Smokeless tobacco: Never Used   Comment: pt started smoking in 1956  . Alcohol Use: No  . Drug  Use: No  . Sexually Active: Not on file   Other Topics Concern  . Not on file   Social History Narrative   Married to [employer] has kidney and pancrease transplant    ROS: Please see the HPI.  All other systems reviewed and negative.  PHYSICAL EXAM:  BP 167/57  Pulse 69  Ht 5\' 4"  (1.626 m)  Wt 142 lb (64.411 kg)  BMI 24.37 kg/m2  General: Well developed, well nourished, in no acute distress. Head:  Normocephalic and atraumatic. Neck: no JVD Lungs: Clear to auscultation and percussion. Heart: Normal S1 and S2. Pos S4.  SEM.  PMI non displaced. Abdomen:  Normal bowel sounds; soft; non tender; no organomegaly Pulses: BIfemoral bruits.  Decrease pulses bilaterally. Extremities: No clubbing or cyanosis. No edema. Neurologic: Alert and oriented x 3.  EKG:  ASSESSMENT AND PLAN:

## 2011-04-29 NOTE — Assessment & Plan Note (Signed)
She thinks she gets lower sugar in am.   Suggested a shift in timing of Lantus.  She has experimented with several.  She will use at night.

## 2011-04-29 NOTE — Patient Instructions (Addendum)
Your physician has requested that you have a cardiac catheterization. Cardiac catheterization is used to diagnose and/or treat various heart conditions. Doctors may recommend this procedure for a number of different reasons. The most common reason is to evaluate chest pain. Chest pain can be a symptom of coronary artery disease (CAD), and cardiac catheterization can show whether plaque is narrowing or blocking your heart's arteries. This procedure is also used to evaluate the valves, as well as measure the blood flow and oxygen levels in different parts of your heart. For further information please visit [email or web address] Please follow instruction sheet, as given.  Your physician has recommended you make the following change in your medication: START Plavix 75mg  take one by mouth daily, STOP Prilosec (Omeprazole), START Pantoprazole 40mg  take one by mouth daily  Your physician recommends that you have lab work today: BMP, CBC, PT/INR

## 2011-04-29 NOTE — Assessment & Plan Note (Signed)
Under management.

## 2011-05-07 NOTE — H&P (Signed)
Nicole [name], Nicole [name]            ACCOUNT NO.:  [id number]  MEDICAL RECORD NO.:  [id number]           PATIENT TYPE:  I  LOCATION:  4735                         FACILITY:  [facility]  PHYSICIAN:  Arturo Morton. Riley Kill, MD, FACCDATE OF BIRTH:  12-Mar-1935  DATE OF ADMISSION:  03/19/2011 DATE OF DISCHARGE:                             HISTORY & PHYSICAL   PRIMARY CARDIOLOGIST:  Maisie Fus C. Daleen Squibb, MD, [facility]  PRIMARY CARE PROVIDER:  Lonzo Cloud. Kriste Basque, MD  PATIENT PROFILE:  A 76 year old female with prior history of coronary disease and chronic diastolic heart failure who presents with 79-month history of progressive dyspnea, volume overload, and increasing substernal chest pressure.  PROBLEMS: 1. Unstable angina/coronary artery disease.     a.     Status post percutaneous coronary intervention x2 in 1998.     b.     Catheterization in June 2001 showing nondominant 80%      stenosis in the right coronary artery.  She also had a 40-30%      stenosis in the marginal.     c.     June 2001, showed negative Myoview.     d.     September 2008, negative Myoview.  Ejection fraction 76%.     e.     April 2010, negative Myoview. 2. Peripheral arterial disease.     a.     Peripheral angiography showing diffuse right superficial      femoral artery stenosis up to 90% as well as diffuse left anterior      tibial and left posterior tibial stenoses of 90 and 80% stenoses      respectively. 3. Hypertension. 4. Hyperlipidemia. 5. Chronic diastolic heart failure. 6. Diabetes mellitus. 7. Mild renal insufficiency. 8. Hypothyroidism. 9. Anemia of chronic disease. 10.Degenerative joint disease. 11.Hiatal hernia. 12.Osteoporosis. 13.Carotid arterial disease with stable anatomy in may 2011 by     ultrasound. 14.Status post left hip fracture with open reduction and internal     fixation, May 2009. 15.Remote tobacco abuse.  ALLERGIES:  No known drug allergies.  HISTORY OF PRESENT ILLNESS:  A 76 year old female  with above complex problem list.  The patient was in her usual state of health approximately 1 month ago when she required a steroid taper for left neck strain.  Following this taper, she began to experience progressive lower extremity edema, orthopnea, PND, dyspnea on exertion, and 10-pound weight gain over the past month.  The patient has also had at least 3-5 episodes of substernal chest pressure occurring with rest and exertion and resolving with nitroglycerin.  The patient with recurrent substernal chest pressure and dyspnea today, for which she took nitroglycerin and came into the [facility] ED.  Here, ECG shows no acute changes and point- of-care markers are negative.  She is currently pain-free.  HOME MEDICATIONS: 1. Aspirin 81 mg daily. 2. Nitroglycerin p.r.n. 3. Atenolol 25 mg daily. 4. Felodipine 10 mg daily. 5. Quinapril 40 mg daily. 6. Lasix 20 mg daily. 7. Crestor 20 mg daily. 8. Coenzyme Q10 plus fish oil daily. 9. Lantus 40 units daily. 10.Humalog t.i.d. a.c. 11.Synthroid 75 mcg daily. 12.Omeprazole 20 mg daily.  13.Caltrate 600 plus D daily. 14.Multivitamin daily. 15.Vitamin D 1000 units daily.  FAMILY HISTORY:  Mother died at 42 with coronary disease and cancer. Father died at 21 of cancer.  She has a brother who is 67, alive and well.  SOCIAL HISTORY:  The patient lives in [place] with her husband.  She does not currently work.  She previously smoked about 20 years and quit in September 2011.  She denies alcohol and drug use.  Do not routinely exercising and walks with cane.  REVIEW OF SYSTEMS:  Positive for chest pressure, dyspnea on exertion, PND, orthopnea, lower extremity edema, occasional presyncope.  She has a history of GERD.  She also reports nasal discharge and a history of left hand numbness.  She is a full code.  Otherwise all systems is negative.  PHYSICAL EXAMINATION:  VITAL SIGNS:  Temperature 97.6, heart rate 61, respirations 16, blood  pressure 135/40, pulse ox 98% on 2 L. GENERAL:  Pleasant white female in no acute distress.  Awake, alert, and oriented x3.  She has normal affect. HEENT:  Normal. NEUROLOGIC:  Grossly intact.  Nonfocal. SKIN:  Warm, dry without lesions or masses. NECK:  Supple with left greater than a bilateral carotid artery bruits. She has JVP approximately 12 cm. LUNGS:  Respirations are regular and unlabored with crackles at bilateral bases, otherwise clear to auscultation. CARDIAC:  Regular.  S1, S2, positive S4.  She has 2/6 systolic ejection murmur at right upper sternal border. ABDOMEN:  Round, soft, nontender, nondistended.  Bowel sounds present x4. EXTREMITIES:  Warm, dry and pink.  No clubbing, cyanosis, 1-2+ bilateral lower extremity edema up to mid ankle.  Chest x-ray shows interstitial edema and small mild pleural effusions. EKG shows sinus bradycardia, rate of 57, normal axis and nonspecific ST- T changes.  Hemoglobin 9.7, hematocrit 29.6, WBC 7.8, platelets 215.  Sodium 139, potassium 3.8, chloride 110, CO2 23, BUN 19, creatinine 1.02, glucose 75.  BNP 167.  CK 78, MB 2.1, troponin-I 0.04, calcium 8.4.  Urinalysis shows few bacteria.  ASSESSMENT: 1. Unstable angina/coronary artery disease.  The patient presents with     a 44-month history of progressive dyspnea and also mild volume     overload as well as intermittent substernal chest pressure that     relieved with nitrates.  She has not had similar chest pain or need     for nitrates in greater than 10 years.  Plan to admit and cycle     cardiac enzymes.  Add IV heparin.  We will continue aspirin, beta-     blocker, ACE inhibitor, statin therapy and plan on catheterization,     likely mid afternoon tomorrow as the patient will require some     diuresis. 2. Acute-on-chronic diastolic congestive heart failure.  The patient     had previously normal LV function.  Repeat 2-D echocardiogram.  She     does have 10-pound weight gain  in the past month and will require     dialysis.  She has received 40 mg IV Lasix here and we will     continue that b.i.d. and reevaluate volume status in the morning     prior to proceeding with catheterization.  Continue beta-blocker,     ACE inhibitor and switch her from atenolol to carvedilol. 3. Diabetes mellitus.  Continue home regimen.  Add sliding scale     insulin. 4. Hypertension, stable.  Follow. 5. History of renal insufficiency.  Creatinine is 1.02.  Follow  with     diuresis.     Nicolasa Ducking, ANP   ______________________________ Arturo Morton. Riley Kill, MD, [facility]    CB/MEDQ  D:  03/19/2011  T:  03/20/2011  Job:  [id number]  Electronically Signed by Nicolasa Ducking ANP on 03/23/2011 03:05:24 PM Electronically Signed by Shawnie Pons MD [facility] on 05/07/2011 09:13:17 AM

## 2011-05-08 ENCOUNTER — Ambulatory Visit (HOSPITAL_COMMUNITY)
Admission: RE | Admit: 2011-05-08 | Discharge: 2011-05-09 | Disposition: A | Discharge status: Home / Self Care | Payer: Medicare Other | Source: Ambulatory Visit | Attending: Cardiology | Admitting: Cardiology

## 2011-05-08 DIAGNOSIS — I2510 Atherosclerotic heart disease of native coronary artery without angina pectoris: Secondary | ICD-10-CM

## 2011-05-08 DIAGNOSIS — K449 Diaphragmatic hernia without obstruction or gangrene: Secondary | ICD-10-CM | POA: Insufficient documentation

## 2011-05-08 DIAGNOSIS — I959 Hypotension, unspecified: Secondary | ICD-10-CM | POA: Insufficient documentation

## 2011-05-08 DIAGNOSIS — Z7902 Long term (current) use of antithrombotics/antiplatelets: Secondary | ICD-10-CM | POA: Insufficient documentation

## 2011-05-08 DIAGNOSIS — I739 Peripheral vascular disease, unspecified: Secondary | ICD-10-CM | POA: Insufficient documentation

## 2011-05-08 DIAGNOSIS — D649 Anemia, unspecified: Secondary | ICD-10-CM | POA: Insufficient documentation

## 2011-05-08 DIAGNOSIS — M1990 Unspecified osteoarthritis, unspecified site: Secondary | ICD-10-CM | POA: Insufficient documentation

## 2011-05-08 DIAGNOSIS — N183 Chronic kidney disease, stage 3 unspecified: Secondary | ICD-10-CM | POA: Insufficient documentation

## 2011-05-08 DIAGNOSIS — Z794 Long term (current) use of insulin: Secondary | ICD-10-CM | POA: Insufficient documentation

## 2011-05-08 DIAGNOSIS — E119 Type 2 diabetes mellitus without complications: Secondary | ICD-10-CM | POA: Insufficient documentation

## 2011-05-08 DIAGNOSIS — E559 Vitamin D deficiency, unspecified: Secondary | ICD-10-CM | POA: Insufficient documentation

## 2011-05-08 DIAGNOSIS — E039 Hypothyroidism, unspecified: Secondary | ICD-10-CM | POA: Insufficient documentation

## 2011-05-08 DIAGNOSIS — E7800 Pure hypercholesterolemia, unspecified: Secondary | ICD-10-CM | POA: Insufficient documentation

## 2011-05-08 DIAGNOSIS — E785 Hyperlipidemia, unspecified: Secondary | ICD-10-CM | POA: Insufficient documentation

## 2011-05-08 DIAGNOSIS — I509 Heart failure, unspecified: Secondary | ICD-10-CM | POA: Insufficient documentation

## 2011-05-08 DIAGNOSIS — H3500 Unspecified background retinopathy: Secondary | ICD-10-CM | POA: Insufficient documentation

## 2011-05-08 DIAGNOSIS — I2584 Coronary atherosclerosis due to calcified coronary lesion: Secondary | ICD-10-CM | POA: Insufficient documentation

## 2011-05-08 DIAGNOSIS — M810 Age-related osteoporosis without current pathological fracture: Secondary | ICD-10-CM | POA: Insufficient documentation

## 2011-05-08 DIAGNOSIS — I5022 Chronic systolic (congestive) heart failure: Secondary | ICD-10-CM | POA: Insufficient documentation

## 2011-05-08 DIAGNOSIS — Z7982 Long term (current) use of aspirin: Secondary | ICD-10-CM | POA: Insufficient documentation

## 2011-05-08 DIAGNOSIS — Z79899 Other long term (current) drug therapy: Secondary | ICD-10-CM | POA: Insufficient documentation

## 2011-05-08 DIAGNOSIS — I6529 Occlusion and stenosis of unspecified carotid artery: Secondary | ICD-10-CM | POA: Insufficient documentation

## 2011-05-08 LAB — POCT I-STAT, CHEM 8
Creatinine, Ser: 0.9 mg/dL (ref 0.4–1.2)
Glucose, Bld: 274 mg/dL — ABNORMAL HIGH (ref 70–99)
HCT: 30 % — ABNORMAL LOW (ref 36.0–46.0)
Hemoglobin: 10.2 g/dL — ABNORMAL LOW (ref 12.0–15.0)
Potassium: 4.3 mEq/L (ref 3.5–5.1)
TCO2: 25 mmol/L (ref 0–100)

## 2011-05-08 LAB — GLUCOSE, CAPILLARY
Glucose-Capillary: 252 mg/dL — ABNORMAL HIGH (ref 70–99)
Glucose-Capillary: 267 mg/dL — ABNORMAL HIGH (ref 70–99)

## 2011-05-09 LAB — BASIC METABOLIC PANEL
CO2: 27 mEq/L (ref 19–32)
Calcium: 8.6 mg/dL (ref 8.4–10.5)
Chloride: 108 mEq/L (ref 96–112)
GFR calc Af Amer: >60 mL/min (ref >60)
Glucose, Bld: 107 mg/dL — ABNORMAL HIGH (ref 70–99)
Potassium: 3.7 mEq/L (ref 3.5–5.1)
Sodium: 142 mEq/L (ref 135–145)

## 2011-05-09 LAB — CBC
HCT: 28.4 % — ABNORMAL LOW (ref 36.0–46.0)
Hemoglobin: 9.6 g/dL — ABNORMAL LOW (ref 12.0–15.0)
MCH: 29.8 pg (ref 26.0–34.0)
MCHC: 33.8 g/dL (ref 30.0–36.0)
MCV: 88.2 fL (ref 78.0–100.0)
RBC: 3.22 MIL/uL — ABNORMAL LOW (ref 3.87–5.11)

## 2011-05-09 LAB — PLATELET INHIBITION P2Y12: P2Y12 % Inhibition: 31 %

## 2011-05-11 LAB — GLUCOSE, CAPILLARY: Glucose-Capillary: 96 mg/dL (ref 70–99)

## 2011-05-22 ENCOUNTER — Telehealth: Payer: Self-pay | Admitting: Pulmonary Disease

## 2011-05-22 MED ORDER — CIPROFLOXACIN HCL 250 MG PO TABS: 250.0000 mg | ORAL_TABLET | Freq: Two times a day (BID) | ORAL | Status: AC

## 2011-05-22 NOTE — Telephone Encounter (Signed)
Cipro 250mg  Twice daily  For 7 days #14 ,no refills Push fluids  Please contact office for sooner follow up if symptoms do not improve or worsen or seek emergency care

## 2011-05-22 NOTE — Telephone Encounter (Signed)
Spoke with pt and notified of recs per TP. Pt verbalized understanding. Rx for cipro was sent to pharm.

## 2011-05-22 NOTE — Telephone Encounter (Signed)
Spoke with pt. She is having urinary frequency/urgency, painful urination, low back pain-onset was this am. No fever, or other c/o's. Would like abx called in. I advised will forward msg to TP, as SN is not in the office today. Please advise thanks!

## 2011-06-02 ENCOUNTER — Telehealth: Payer: Self-pay | Admitting: Pulmonary Disease

## 2011-06-02 NOTE — Telephone Encounter (Signed)
?   Is she still having sx? LMTCB.

## 2011-06-03 NOTE — Telephone Encounter (Signed)
ATC NA and no option to leave a msg, WCB.  

## 2011-06-04 ENCOUNTER — Encounter: Payer: Self-pay | Admitting: Cardiology

## 2011-06-04 ENCOUNTER — Ambulatory Visit (INDEPENDENT_AMBULATORY_CARE_PROVIDER_SITE_OTHER): Payer: Medicare Other | Admitting: Cardiology

## 2011-06-04 VITALS — BP 130/70 | HR 60 | Resp 14 | Ht 65.0 in | Wt 143.0 lb

## 2011-06-04 DIAGNOSIS — I679 Cerebrovascular disease, unspecified: Secondary | ICD-10-CM

## 2011-06-04 DIAGNOSIS — I2510 Atherosclerotic heart disease of native coronary artery without angina pectoris: Secondary | ICD-10-CM

## 2011-06-04 DIAGNOSIS — I6529 Occlusion and stenosis of unspecified carotid artery: Secondary | ICD-10-CM

## 2011-06-04 DIAGNOSIS — E7800 Pure hypercholesterolemia, unspecified: Secondary | ICD-10-CM

## 2011-06-04 MED ORDER — ATORVASTATIN CALCIUM 40 MG PO TABS: 40.0000 mg | ORAL_TABLET | Freq: Every day | ORAL | Status: DC

## 2011-06-04 NOTE — Patient Instructions (Signed)
Your physician has requested that you have a carotid duplex. This test is an ultrasound of the carotid arteries in your neck. It looks at blood flow through these arteries that supply the brain with blood. Allow one hour for this exam. There are no restrictions or special instructions.  Your physician has recommended you make the following change in your medication: after you have finished Crestor, START Atorvastatin.  Your physician recommends that you return for lab work in: 6-8 weeks AFTER starting Atorvastain. Please call Dr. Vern Claude nurse when you start the Atorvastatin.  We will schedule your labs then at the [facility] office  Your physician recommends that you schedule a follow-up appointment in: 6 months with Dr. Daleen Squibb

## 2011-06-04 NOTE — Telephone Encounter (Signed)
Spoke with pt and she states that she is no longer needing UA done as her symptoms have resolved. She states nothing further needed.

## 2011-06-04 NOTE — Assessment & Plan Note (Signed)
Stable. Continue medical therapy 

## 2011-06-04 NOTE — Assessment & Plan Note (Signed)
Stable. Repeat carotid Dopplers

## 2011-06-04 NOTE — Assessment & Plan Note (Signed)
Switched to generic Lipitor per patient request because of financial issues. Followup blood work in 6 weeks

## 2011-06-04 NOTE — Progress Notes (Signed)
HPI   Nicole [name] returns today for the evaluation and of her coronary artery disease and diffuse vascular disease. She is having no angina on medical therapy. Dr. Riley Kill did a thorough investigation as outlined in his last catheterization note.Her FFR was .96  So medical therapy was appropriate.  She is having no symptoms of TIAs or mini strokes. He denies any claudication or any unhealed areas of her feet.  She cannot afford Crestor. She would like to go on a generic. Her LDL was 99 but she was reluctant to increase Crestor because of myalgias.  Last carotid Dopplers were stable with nonobstructive plaque in April 2011. Past Medical History  Diagnosis Date  . Retinopathy   . Hypertension   . CHF (congestive heart failure)   . CAD (coronary artery disease)   . Cerebrovascular disease   . Peripheral vascular disease   . Hypercholesterolemia   . Diabetes mellitus   . Hypothyroidism   . Hiatal hernia   . Degenerative joint disease   . Osteoporosis   . Vitamin D deficiency   . Anemia of chronic disease     Past Surgical History  Procedure Date  . Abdominal aortogram 07/27/2003    by Dr. Maricela Curet  . Bilateral lower extremity angiogram 07/27/2003    by Dr. Maricela Curet  . Selective angiography,right superficial femoral artery, right and left iliac arteries 07/27/2003    by Dr. Maricela Curet  . Measurement of gradient right and left iliac arteries 07/27/2003    by Dr. Maricela Curet  . Bilateral cataract sugery 2000 and 2002    by Dr. Cecilie Kicks  . Repair left hip fracture 04/2008    by Dr. Charlann Boxer    No family history on file.  History   Social History  . Marital Status: Married    Spouse Name: N/A    Number of Children: 2  . Years of Education: N/A   Occupational History  . retired    Social History Main Topics  . Smoking status: Former Smoker    Types: Cigarettes    Quit date: 12/07/1974  . Smokeless tobacco: Never Used   Comment: pt started smoking in 1956  . Alcohol  Use: No  . Drug Use: No  . Sexually Active: Not on file   Other Topics Concern  . Not on file   Social History Narrative   Married to [employer] has kidney and pancrease transplant    Allergies  Allergen Reactions  . Ibuprofen     REACTION: edema---thyroid problems    Current Outpatient Prescriptions  Medication Sig Dispense Refill  . acetaminophen (TYLENOL) 500 MG tablet Take 500 mg by mouth every 6 (six) hours as needed.        Marland Kitchen amitriptyline (ELAVIL) 10 MG tablet Take 10 mg by mouth at bedtime as needed.       . Ascorbic Acid (VITAMIN C) 500 MG tablet Take 500 mg by mouth daily.        Marland Kitchen aspirin 81 MG tablet Take 81 mg by mouth daily.        . Calcium Carbonate-Vitamin D (CALTRATE 600+D) 600-400 MG-UNIT per chew tablet Chew 1 tablet by mouth daily.        . carvedilol (COREG) 6.25 MG tablet Take 1 tablet (6.25 mg total) by mouth 2 (two) times daily with a meal.  60 tablet  11  . Cholecalciferol (VITAMIN D) 1000 UNITS capsule Take 1,000 Units by mouth daily.        Marland Kitchen  felodipine (PLENDIL) 10 MG 24 hr tablet Take 10 mg by mouth daily.        . furosemide (LASIX) 20 MG tablet TAKE 1 TABLET ONCE A DAY  90 tablet  2  . insulin glargine (LANTUS) 100 UNIT/ML injection Inject 40 Units into the skin at bedtime.        . insulin lispro (HUMALOG) 100 UNIT/ML injection Inject 5 to 15 units subcutaneously as needed before meals  10 mL  4  . levothyroxine (SYNTHROID, LEVOTHROID) 75 MCG tablet Take 75 mcg by mouth daily.        . multivitamin (THERAGRAN) per tablet Take 1 tablet by mouth daily.        . nitroGLYCERIN (NITROSTAT) 0.4 MG SL tablet Place 0.4 mg under the tongue every 5 (five) minutes as needed. May repeat up to 3 doses       . quinapril (ACCUPRIL) 40 MG tablet Take 40 mg by mouth daily.        . traMADol (ULTRAM) 50 MG tablet Take 50 mg by mouth every 6 (six) hours as needed.        Marland Kitchen DISCONTD: CRESTOR 20 MG tablet TAKE 1 TABLET DAILY  90 tablet  2  .  methocarbamol (ROBAXIN) 500 MG tablet Take 1 tablet (500 mg total) by mouth 3 (three) times daily as needed. For muscle spasms as needed  90 tablet  5  . DISCONTD: clopidogrel (PLAVIX) 75 MG tablet Take 1 tablet (75 mg total) by mouth daily.  30 tablet  11  . DISCONTD: ferrous sulfate 325 (65 FE) MG EC tablet Take 325 mg by mouth 2 (two) times daily.        Marland Kitchen DISCONTD: pantoprazole (PROTONIX) 40 MG tablet Take 1 tablet (40 mg total) by mouth daily.  30 tablet  11    ROS Negative other than HPI.   PE General Appearance: well developed, well nourished in no acute distress, fragile HEENT: symmetrical face, PERRLA, good dentition  Neck: no JVD, thyromegaly, or adenopathy, trachea midline Chest: symmetric without deformity Cardiac: PMI non-displaced, RRR, normal S1, S2, no gallop or murmur Lung: clear to ausculation and percussion Vascular: carotid bruits, difficult to feel distal portion the pulses but good capillary refill Abdominal: nondistended, nontender, good bowel sounds, no HSM, no bruits Extremities: no cyanosis, clubbing or edema, no sign of DVT, no varicosities  Skin: normal color, no rashes Neuro: alert and oriented x 3, non-focal Pysch: normal affect Filed Vitals:   06/04/11 1018  BP: 130/70  Pulse: 60  Resp: 14  Height: 5\' 5"  (1.651 m)  Weight: 143 lb (64.864 kg)    EKG  Labs and Studies Reviewed.   Lab Results  Component Value Date   WBC 4.4 05/09/2011   HGB 9.6* 05/09/2011   HCT 28.4* 05/09/2011   MCV 88.2 05/09/2011   PLT 164 05/09/2011      Chemistry      Component Value Date/Time   NA 142 05/09/2011 0500   K 3.7 05/09/2011 0500   CL 108 05/09/2011 0500   CO2 27 05/09/2011 0500   BUN 23 05/09/2011 0500   CREATININE 0.84 05/09/2011 0500      Component Value Date/Time   CALCIUM 8.6 05/09/2011 0500   ALKPHOS 108 03/20/2011 0610   AST 21 03/20/2011 0610   ALT 14 03/20/2011 0610   BILITOT 1.1 03/20/2011 0610       Lab Results  Component Value Date   CHOL 166 12/09/2010    CHOL  179 02/26/2010   CHOL 179 04/25/2009   Lab Results  Component Value Date   HDL 43.00 12/09/2010   HDL 50.30 02/26/2010   HDL 16.10 04/25/2009   Lab Results  Component Value Date   LDLCALC 99 12/09/2010   LDLCALC 104* 02/26/2010   LDLCALC 104* 04/25/2009   Lab Results  Component Value Date   TRIG 122.0 12/09/2010   TRIG 125.0 02/26/2010   TRIG 113.0 04/25/2009   Lab Results  Component Value Date   CHOLHDL 4 12/09/2010   CHOLHDL 4 02/26/2010   CHOLHDL 3 04/25/2009   Lab Results  Component Value Date   HGBA1C 9.4* 02/24/2011   Lab Results  Component Value Date   ALT 14 03/20/2011   AST 21 03/20/2011   ALKPHOS 108 03/20/2011   BILITOT 1.1 03/20/2011   Lab Results  Component Value Date   TSH 3.702 03/20/2011

## 2011-06-18 ENCOUNTER — Telehealth: Payer: Self-pay | Admitting: Cardiology

## 2011-06-18 NOTE — Telephone Encounter (Signed)
Pt received letter that it was time for her to schedule appt with Dr. Myrtis Ser.  Pt sees Dr. Daleen Squibb and did on 06/04/11. Mylo Red RN

## 2011-06-18 NOTE — Telephone Encounter (Signed)
Pt has questions regarding PV test that is scheduled

## 2011-06-23 ENCOUNTER — Encounter (INDEPENDENT_AMBULATORY_CARE_PROVIDER_SITE_OTHER): Payer: Medicare Other | Admitting: *Deleted

## 2011-06-23 DIAGNOSIS — I6529 Occlusion and stenosis of unspecified carotid artery: Secondary | ICD-10-CM

## 2011-06-25 ENCOUNTER — Encounter: Payer: Self-pay | Admitting: Cardiology

## 2011-06-25 NOTE — Discharge Summary (Signed)
Nicole [name], Nicole [name]            ACCOUNT NO.:  [id number]  MEDICAL RECORD NO.:  [id number]           PATIENT TYPE:  O  LOCATION:  6531                         FACILITY:  [facility]  PHYSICIAN:  Arturo Morton. Riley Kill, MD, FACCDATE OF BIRTH:  [birth year]  DATE OF ADMISSION:  05/08/2011 DATE OF DISCHARGE:  05/09/2011                              DISCHARGE SUMMARY   PRIMARY CARDIOLOGIST:  Maisie Fus C. Wall, MD, [facility]  DISCHARGE DIAGNOSIS: 1. Coronary artery disease.     a.     Status post cardiac catheterization, medical management      recommended.  SECONDARY DIAGNOSES: 1. Coronary artery disease     a.     Status post percutaneous coronary intervention, 1990. 2. Chronic diastolic heart failure. 3. Peripheral arterial disease. 4. Diabetes mellitus. 5. Hypotension. 6. Dyslipidemia. 7. Chronic kidney disease (stage III). 8. Carotid artery disease. 9. Chronic anemia. 10.Hypothyroidism. 11.History of retinal hemorrhage.  REASON FOR ADMISSION:  The patient is a 76 year old female, with cardiac history as outlined above, who recently presented to Dr. Riley Kill in the clinic for postcatheterization followup.  She had undergone a procedure on March 21, 2011, performed by Dr. Gala Romney, suggesting apparent high- grade calcified lesion in the mid CFX, with widely patent OM-2 stent site, and normal LV function.  She was admitted for possible PCI, utilizing both FFR and IVUS, as well.  HOSPITAL COURSE:  The patient presented for elective procedure, performed by Dr. Shawnie Pons, with results notable for evidence of calcified disease of the proximal circumflex artery (dominant), with normal flow.  Dr. Riley Kill discussed the results in detail with the patient, explaining that she did not have evidence of USAP, with an FFR of 0.96.  Therefore, medical therapy was recommended.  He did, however, explain that there was diffuse calcification in the circumflex artery.  However, given her underlying  history of anemia and history of retinal hemorrhage, he felt that the risk of DAPT was high.  Continued medical therapy was recommended, in the absence of more problems.  He also indicated that Plavix was to be stopped, after an additional 5 days.  DISCHARGE LABORATORY DATA:  Hemoglobin 9.6, hematocrit 28, WBC 4.4, and platelet 164.  P2Y12 of 305 with 31% inhibition.  Sodium 142, potassium 3.7, BUN 23, creatinine 0.8, and glucose 107.  DISPOSITION:  Stable.  FOLLOWUP:  Dr. Valera Castle in 1-2 weeks.  Arrangements to be made through our office.  DISCHARGE MEDICATIONS: 1. Aspirin 81 mg daily. 2. Plavix 75 mg daily. 3. Amitriptyline 10 mg nightly. 4. Crestor 20 mg daily. 5. Carvedilol 6.25 mg b.i.d. 6. Felodipine 2 mg daily. 7. Ferrous sulfate 325 mg b.i.d. 8. Furosemide 20 mg daily. 9. Humalog sliding scale as directed. 10.Lantus 40 units nightly. 11.Levothyroxine 0.075 mg daily. 12.Protonix 40 mg daily. 13.Quinapril 40 mg daily. 14.Nitrostat 0.4 mg p.r.n.  INSTRUCTIONS:  The patient is to continue Plavix for an additional 5 days.  DURATION OF DISCHARGE ENCOUNTER:  Greater than 30 minutes, including physician time.     Gene Serpe, PA-C   ______________________________ Arturo Morton. Riley Kill, MD, [facility]    GS/MEDQ  D:  05/09/2011  T:  05/09/2011  Job:  [id number]  cc:   Lonzo Cloud. Kriste Basque, MD  Electronically Signed by Rozell Searing PA-C on 05/12/2011 09:49:13 AM Electronically Signed by Shawnie Pons MD [facility] on 06/25/2011 07:19:18 AM

## 2011-06-25 NOTE — Cardiovascular Report (Signed)
[name], Nicole            ACCOUNT NO.:  [id number]  MEDICAL RECORD NO.:  [id number]           PATIENT TYPE:  O  LOCATION:  6531                         FACILITY:  [facility]  PHYSICIAN:  Arturo Morton. Riley Kill, MD, FACCDATE OF BIRTH:  [birth year]  DATE OF PROCEDURE:  05/08/2011 DATE OF DISCHARGE:                           CARDIAC CATHETERIZATION   INDICATIONS:  Nicole [name] is a very delightful woman.  She has had prior stenting of the circumflex coronary artery.  She was recently admitted after being treated with steroids and had volume overload.  She underwent cardiac catheterization by Dr. Gala Romney, which demonstrated a moderately high-grade stenosis of the proximal circumflex coronary artery.  How much represented stenosis and how much represented calcific attenuation of the image was unclear.  The patient had no history of exertional angina, but she does have insulin-dependent diabetes mellitus.  Her situation is complicated.  She has largely been anemic, but was treated with some iron with some improvement.  She was started a week ago on Plavix.  She has a history of proliferative retinopathy of the eye as well as some rectal bleeding, on aspirin.  In addition, she has had some elevation of her creatinine.  All of these things have been closely monitored in the office and had been seen regularly.  She has remained virtually asymptomatic without any kind of exertional chest tightness despite a modest amount of activity.  We carefully reviewed all of the potential approaches and options with the patient.  When it seemed as though her situation was stabilized and she was stable, the decision was made to proceed with careful physiologic and then potentially anatomic evaluation.  We spoke at great length prior to the procedure about the potential approach.  One reason of concern was the fact that the distal circumflex provides not only a large marginal branch which have been  previously stented, but in addition, supplies the inferior wall.  Her left ventricular ejection fraction is preserved. Risks, benefits, and alternatives were extensively covered, and she was brought to the laboratory for further evaluation.  We laid out the strategy carefully with the patient and her family prior to proceeding.  PROCEDURE: 1. Angiography of the left coronary artery after placement of     catheters without left heart catheterization. 2. Fractional flow reserve, pressure analysis of the circumflex     coronary artery.  DESCRIPTION OF PROCEDURE:  The patient was brought to the catheterization laboratory, prepped and draped in the usual fashion.  We used an anterior puncture, and entered the right femoral artery.  A 6- French sheath was placed.  Following this, a diagnostic view was obtained to ensure that we needed to proceed.  With this, two views were obtained and I felt that the best option would be at this point to proceed with analysis.  Following this, the patient was given weight adjusted bivalve routing.  A CT was checked and found to be appropriate. A volcano flow wire was then placed down the artery through a JL-3.5 guiding catheter.  We were able to manipulate it finally across the proximal calcified area, but crossed the second lesion that comes  after the takeoff of the marginal branch.  Adenosine was infused as a continuous infusion by protocol.  Continuous measurements of the findings revealed an FFR 0.96 to 0.97.  With these findings, it was felt that further intervention would not be appropriate.  We removed the wave wire, and did a brief injection confirming continued patency of the artery.  All catheters were subsequently removed.  The femoral sheath was sewn into place.  She was taken to the holding area in satisfactory clinical condition.  HEMODYNAMIC DATA:  The central aortic pressure is 136/40 with a mean of 69.  ANGIOGRAPHIC DATA:  On plain  fluoroscopy, there is extensive calcification of the left coronary artery.  The left main proximal circumflex is heavily calcified.  There is a stent placed in the obtuse marginal branch of the circumflex vessel.  Proximally, there is a hypodense lesion in the area of circumferential calcification.  When repeated with fairly good seating of the guide, how severe this is, it is difficult to tell.  It could be 70 or more depending upon the impact of calcification.  In the LAO view, it appears to be about 70%, but there is clearly some filling defect related probably to calcification. As noted, the patient has been stable over more than a month without any exertional symptoms.  The marginal branch has some ostial narrowing approximately 60% and then a widely patent stent.  The AV circumflex has 60%-70% area of narrowing leading down into the posterolateral and posterior descending branches with both have a diabetic appearance. Following analysis using the pressure wire, FFR was noted to be 0.97. All catheters were subsequently removed.  The angiographic appearance of the vessel was unchanged following the procedure.  CONCLUSION: 1. Well-known preserved left ventricular systolic function. 2. Calcified disease of the proximal circumflex coronary artery which     is a dominant vessel of uncertain severity with normal flow     characteristics in a patient with stable symptoms.  DISPOSITION:  Based upon the findings of the flow wire, a continued medical therapy would be recommended.  The patient was at moderately high risk.  She is an insulin-dependent diabetic that would likely require drug-eluting platform.  She has a baseline hemoglobin of about 10 today, and she has had a history of proliferative retinopathy as well as some rectal bleeding on antiplatelet therapy.  As she is stable without current symptoms, a continued approach will be recommended.  She will follow up with Dr. Valera Castle.   We will keep her in the hospital overnight.     Arturo Morton. Riley Kill, MD, [facility]     TDS/MEDQ  D:  05/08/2011  T:  05/09/2011  Job:  [id number]  cc:   Nicole Frankowski C. Daleen Squibb, MD, [facility] M. Kriste Basque, MD CV Laboratory  Electronically Signed by Shawnie Pons MD [facility] on 06/25/2011 07:19:15 AM

## 2011-06-30 ENCOUNTER — Telehealth: Payer: Self-pay | Admitting: *Deleted

## 2011-06-30 NOTE — Telephone Encounter (Signed)
Pt aware of carotid results Debbie Mykelti Goldenstein RN  

## 2011-06-30 NOTE — Telephone Encounter (Signed)
Message copied by Theda Belfast on Tue Jun 30, 2011 10:35 AM ------      Message from: Valera Castle C      Created: Sun Jun 28, 2011  4:08 PM       STABLE, NO CHANGE IN TREATMENT, FOLLOWUP IN 1 YEAR

## 2011-07-28 ENCOUNTER — Telehealth: Payer: Self-pay | Admitting: Cardiology

## 2011-07-28 DIAGNOSIS — E7800 Pure hypercholesterolemia, unspecified: Secondary | ICD-10-CM

## 2011-07-28 NOTE — Telephone Encounter (Signed)
Pt calls today to schedule fasting lab work. She started taking Atorvastatin yesterday. Fasting labs ordered for 09/23/11 at the [facility] Mylo Red RN

## 2011-07-28 NOTE — Telephone Encounter (Signed)
Patient started taken atorvastatin 40 mg on yesterday.  Was Told to call back to speak with nurse.

## 2011-07-29 ENCOUNTER — Telehealth: Payer: Self-pay | Admitting: Pulmonary Disease

## 2011-07-29 MED ORDER — INSULIN GLARGINE 100 UNIT/ML ~~LOC~~ SOLN: 40.0000 [IU] | Freq: Every day | SUBCUTANEOUS | Status: DC

## 2011-07-29 NOTE — Telephone Encounter (Signed)
Per pt, Lantus is due for refills and we need to send to [facility] for 4 vials with 3 refills. Refill sent.

## 2011-09-01 ENCOUNTER — Ambulatory Visit (INDEPENDENT_AMBULATORY_CARE_PROVIDER_SITE_OTHER): Payer: Medicare Other | Admitting: Pulmonary Disease

## 2011-09-01 ENCOUNTER — Encounter: Payer: Self-pay | Admitting: Pulmonary Disease

## 2011-09-01 DIAGNOSIS — E7800 Pure hypercholesterolemia, unspecified: Secondary | ICD-10-CM

## 2011-09-01 DIAGNOSIS — I10 Essential (primary) hypertension: Secondary | ICD-10-CM

## 2011-09-01 DIAGNOSIS — I2510 Atherosclerotic heart disease of native coronary artery without angina pectoris: Secondary | ICD-10-CM

## 2011-09-01 DIAGNOSIS — E039 Hypothyroidism, unspecified: Secondary | ICD-10-CM

## 2011-09-01 DIAGNOSIS — E119 Type 2 diabetes mellitus without complications: Secondary | ICD-10-CM

## 2011-09-01 DIAGNOSIS — Z23 Encounter for immunization: Secondary | ICD-10-CM

## 2011-09-01 DIAGNOSIS — I679 Cerebrovascular disease, unspecified: Secondary | ICD-10-CM

## 2011-09-01 DIAGNOSIS — I739 Peripheral vascular disease, unspecified: Secondary | ICD-10-CM

## 2011-09-01 MED ORDER — LEVOTHYROXINE SODIUM 75 MCG PO TABS: 75.0000 ug | ORAL_TABLET | Freq: Every day | ORAL | Status: DC

## 2011-09-01 MED ORDER — FELODIPINE ER 10 MG PO TB24: 10.0000 mg | ORAL_TABLET | Freq: Every day | ORAL | Status: DC

## 2011-09-01 MED ORDER — FUROSEMIDE 20 MG PO TABS: 20.0000 mg | ORAL_TABLET | Freq: Every day | ORAL | Status: DC

## 2011-09-01 MED ORDER — QUINAPRIL HCL 40 MG PO TABS: 40.0000 mg | ORAL_TABLET | Freq: Every day | ORAL | Status: DC

## 2011-09-01 NOTE — Patient Instructions (Signed)
Today we updated your med list in EPIC...    We refilled the meds you requested...  We will arrange for your FASTING blood work 09/23/11 as you requested...    We will call you w/ the results when avail...  Try to slowly DECREASE the LANTUS insulin  By titrating the dose to your FASTING blood sugar in the AM as we discussed...  We gave you the 2012 Flu vaccine today...  Call for any questions.Marland KitchenMarland KitchenMarland Kitchen

## 2011-09-02 LAB — DIFFERENTIAL
Basophils Relative: 0
Eosinophils Relative: 1
Lymphocytes Relative: 15
Monocytes Absolute: 0.5
Monocytes Relative: 6
Neutro Abs: 5.9

## 2011-09-02 LAB — CBC
HCT: 28.6 — ABNORMAL LOW
HCT: 30.4 — ABNORMAL LOW
HCT: 36.3
Hemoglobin: 12.2
MCHC: 33.7
MCV: 93.8
Platelets: 140 — ABNORMAL LOW
Platelets: 154
RBC: 3.92
RDW: 13.3
RDW: 13.7
RDW: 13.9

## 2011-09-02 LAB — COMPREHENSIVE METABOLIC PANEL
Albumin: 2.8 — ABNORMAL LOW
Alkaline Phosphatase: 80
BUN: 24 — ABNORMAL HIGH
Potassium: 4.4
Sodium: 136
Total Protein: 5.8 — ABNORMAL LOW

## 2011-09-02 LAB — BASIC METABOLIC PANEL
BUN: 18
CO2: 25
Calcium: 9.4
Chloride: 101
Chloride: 104
Creatinine, Ser: 0.94
GFR calc Af Amer: >60
Glucose, Bld: 134 — ABNORMAL HIGH
Glucose, Bld: 256 — ABNORMAL HIGH
Potassium: 3.7
Potassium: 4.1
Sodium: 135

## 2011-09-02 LAB — LIPID PANEL
Cholesterol: 108
LDL Cholesterol: 52
Triglycerides: 78
VLDL: 16

## 2011-09-02 LAB — HEMOGLOBIN A1C: Mean Plasma Glucose: 243

## 2011-09-23 ENCOUNTER — Other Ambulatory Visit: Payer: Self-pay | Admitting: Pulmonary Disease

## 2011-09-23 ENCOUNTER — Other Ambulatory Visit (INDEPENDENT_AMBULATORY_CARE_PROVIDER_SITE_OTHER): Payer: Medicare Other

## 2011-09-23 ENCOUNTER — Other Ambulatory Visit: Payer: Medicare Other

## 2011-09-23 DIAGNOSIS — E7800 Pure hypercholesterolemia, unspecified: Secondary | ICD-10-CM

## 2011-09-23 DIAGNOSIS — I10 Essential (primary) hypertension: Secondary | ICD-10-CM

## 2011-09-23 DIAGNOSIS — E119 Type 2 diabetes mellitus without complications: Secondary | ICD-10-CM

## 2011-09-23 LAB — HEPATIC FUNCTION PANEL
AST: 29 U/L (ref 0–37)
Alkaline Phosphatase: 106 U/L (ref 39–117)
Bilirubin, Direct: 0.1 mg/dL (ref 0.0–0.3)
Total Bilirubin: 1 mg/dL (ref 0.3–1.2)

## 2011-09-23 LAB — BASIC METABOLIC PANEL
GFR: 64.96 mL/min (ref >60.00)
Glucose, Bld: 149 mg/dL — ABNORMAL HIGH (ref 70–99)
Potassium: 3.9 mEq/L (ref 3.5–5.1)
Sodium: 140 mEq/L (ref 135–145)

## 2011-09-23 LAB — URINALYSIS, ROUTINE W REFLEX MICROSCOPIC
Ketones, ur: NEGATIVE
Specific Gravity, Urine: 1.02 (ref 1.000–1.030)
Total Protein, Urine: 100
Urine Glucose: NEGATIVE
pH: 7.5 (ref 5.0–8.0)

## 2011-09-23 LAB — LIPID PANEL
LDL Cholesterol: 110 mg/dL — ABNORMAL HIGH (ref 0–99)
Total CHOL/HDL Ratio: 4
VLDL: 25 mg/dL (ref 0.0–40.0)

## 2011-09-28 ENCOUNTER — Encounter: Payer: Self-pay | Admitting: Pulmonary Disease

## 2011-09-28 ENCOUNTER — Telehealth: Payer: Self-pay | Admitting: Cardiology

## 2011-09-28 NOTE — Telephone Encounter (Signed)
I spoke with pt today and she would like to switch her atorvastatin to a less expensive generic statin.  She is in the "doughnut hole" and atorvastatin is $75/month (  She had been on Crestor it is $80/month). She is aware of her cholesterol results as drawn by pcp Dr. Kriste Basque Pt would like Dr. Daleen Squibb to review.  Mylo Red RN

## 2011-09-28 NOTE — Progress Notes (Signed)
Subjective:    Patient ID: Nicole [name], female    DOB: [birth year], 76 y.o.   MRN: [id number]  HPI 76 y/o WF here for a follow up visit... she has mult med problems including IDDM w/ hx poor control;  HBP;  CAD followed by DrWall;  Cerebrovasc & Peripheral vasc dis;  Hyperlipidemia;  Hypothyroidism;  Large HH;  DJD w/ neck & LBP;  Osteoporosis & Vit D defic;  Hx Anemia...  ~  February 24, 2011:  6 month ROV & c/o left sided neck pain x 55mo started while making fudge in Dec> c/o tight leaders, popping & cracking, HA, etc; radiating to shoulder & up to head, no weakness/ numbness, etc;  She has been evaluated by DrBrooks, [facility], in the past w/ prev XRays she says (he wants to give shots, she declined- we don't have records);  She can't get MRIs due to screws in left hip;  We discussed rx w/ Pred Dosepak, Tramadol/ Tylenol. Add Robaxin 500mg Tid, rest/ heat, & she will call DrBrooks for f/u...    Ortho followed by DrOlin/ Ramos at [facility] for prev left fem neck fracture 5/09 w/ closed reduction & percut screw fixation... severe LBP & pain all over- given epid steroid shots w/ min relief; pain Rx w/ DCN in the past was too strong (sedated) therefore try Tramadol...    Cards followed by DrWall- HBP, diffuse vascular dis, r/o CAD, hyperlipidemia> stable w/ severe disease on Atenolol, Quinapril, Felodipine, Lasix;  BP today= 126/60 & denies CP, palpit, change in dyspnea, edema, etc... She is way too sedentary because of her Ortho problems... She last saw DrWall 11/11 & his note is reviewed- stable, no changes made...    Hyperchol>  She had FLP done by [facility] 1/12 on Crestor20mg /d w/ TChol 166, TG 122, HDL 43, LDL 99> he wanted to incr the Crestor to 40mg  but pt declined 7 preferred to keep it at 20mg /d & work on diet & wt reduction...    Her main problem is brittle DM> difficult contol on Lantus/ Humalog w/ freq adjustments & tried on Levemir but intol ("the shots burned & made me weak")... she  refused Endocrine referral... she has DM retinopathy w/ macular edema followed in the [facility] clinic... We have been trying to get her to slowly increase her Lantus by 2u increments titrating it to her FBS;  But she has refused, thwarted our attempts at better control by always returning her Lantus to 40u daily;  We discussed this strategy again today...  ~  August 31, 2011:  78mo ROV & she complains of freq hypoglycemia in early AM hours- she's been taking Lantus 40u anf Humalog SS betw 7-10u at mealtimes; her accuchecks are all over the place & she will ret for FASTING blood work (see below)>>    Retinopathy> followed at [facility] (we don't have notes); she is blind in the right eye...    HBP> controlled on Coreg6.25Bid, Plendil10, Quinapril40, Lasix20; BP= 142/60 & even better at home she says...     CAD> She had extensive Cards eval by DrStuckey & DrWall; Caths 4/12 & 6/12 showed worrisome lesion in proxCFX- they decided on medical therapy (see below, & DrWalls notes are reviewed); she denies angina, palpit, ch in chr DOE, edema, etc...    ASPVD> CDopplers 7/12 stable, no cerebral ischemic symptoms; stable PAD w/o claud & known sm vessel dis distally, no LE lesions etc...    CHOL> on Lip40 & diet ==> FLP  shows TChol 186, TG 125, HDL 51, LDL 110    DM> as above, on Lantus/ Humalog w/ BS up & down at home ==> FBS=149 & A1c=9.4    Hypothy> stable on Levothy75...    GI> back on Protonix 40mg /d...    DJD>  Still having lots of back pain & followed by Mayford Knife; on Tramadol prn...    Anemia> she is off her prev iron therapy but still taking lots of Vits etc...          Problem List:  RETINOPATHY (ICD-363.20) - followed at [facility] clinic DrKarup (we don't have notes)... 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  HYPERTENSION (ICD-401.9) - now on COREG 6.25mg Bid, QUINAPRIL 40mg /d, FELODIPINE 10mg /d & LASIX 20mg /d... BP= 142/60 today and 120-130 at  home... she denies CP, palpit, change in dyspnea, PND, etc...  CAD (ICD-414.00) &  CHRONIC DIASTOLIC CHF - on MEDS above... DrWall's & DrStuckey's notes are reviewed- denies recurrent angina, palpit, change in dyspnea or edema... we reviewed risk factor reduction strategy... ~  Prior stent in marginal branch of Circ in 1998 ~  Myoview 4/10 was negative- no scar or ischemia, EF= 73%... ~  labs 3/11 showed BUN=18, Crear=1.0, BNP=107, & EKG essent WNL.Marland Kitchen. ~  4/12:  Hosp w/ Diastolic CHF after Pred Rx and fluid retention; diuresed & improved... ~  2DEcho 4/12 showed EF 65-70% with normal wall motion & grade 2 DD, mildMR, mild LAdil... ~  Cath 4/12 showed  50% midLAD, 90% calcified stenosis in proxCIRC, patent stent in second obtuse marginal, 70-80% proxRCA (non-dominent, sm vessel), EF=65%, LVEDP=6mmHg. ~  [facility] 6/12 for re-cath by DrStuckey> He discussed results in detail with the patient, explaining that she did not have evidence of USAP, with an FFR of 0.96> medical therapy was recommended.  CEREBROVASCULAR DISEASE (ICD-437.9) - on ASA daily... no cerebral ischemic symptoms. ~  CDoppler 4/08 showing stable bilat carotid dis w/ 40-59% bilat stenoses... ~  repeat studies 4/09 showed mild smooth heterogeneous plaque w/ 40-59% bilat ICA stenoses. ~  f/u studies 4/10 showed mild to mod heterogen plaque bilat- 40-59% bilat stenoses, stable. ~  CDopplers 5/11 showed stable mild irreg plaque, 40-59% bilat ICAstenoses, no change. ~  CDopplers 7/12 showed stable mild heterogeneous plaque bilat w/ 40-59% bilat ICA stenoses; f/u planned 94yr...  PERIPHERAL VASCULAR DISEASE (ICD-443.9) - abnormal ABI's and eval by DrDowney in 3/08 (& prev DrGupta 2004)... she had arteriogram w/ bilat dis but no indication for PTA... 20% bilat ostial renal lesions also noted... no complaints of claudication etc, ambulates w/ cane, BP controlled. ~  LE Vasc study 4/10 showed known diffuse bilat arterial occlusive dis- stable large  vessel dis & progressive sm vessel dis w/ toe/ brachial indicies at risk for tissue loss... DrWall is following.  HYPERCHOLESTEROLEMIA (ICD-272.0) - now on LIPITOR 40mg /d + FishOil & CoQ10... ~  FLP 3/08 showed TChol 158, Tg 119, HDL 43, LDL 92 ~  FLP 3/09 showed TChol 173, TG 151, HDL 41, LDL 102 ~  FLP 1/10 off med x 41mo showed TChol 349, TG 173, HDL 45, LDL 258... rec change Statin, but she prefers Cres20. ~  FLP 5/10 on Cres20 showed TChol 179, TG 113, HDL 52, LDL 104... rec> continue Cres20. ~  FLP 3/11 on Cres20 showed TChol 179, TG 125, HDL 50, LDL 104... She would not incr dose & wanted off Cres due to $$ & aching. ~  FLP 10/12  on Lip40 showed TChol 186, TG 125, HDL 51, LDL 110... She will not incr the dose.  DIABETES MELLITUS (ICD-250.00) - very brittle DM, insulin dependent and prev Rx by Endocrinology... she has retinopathy, neuropathy, and nephropathy w/ proteinuria noted (on ACE & Felodipine)... currently taking LANTUS 40 u daily, and HUMALOG 5-15 before each meal... we have discussed adjustments designed to improve her overall control without resulting hypoglycemia... ~  labs 9/08 showed BS= 171, HgA1c= 8.7 ~  labs 3/09 showed BS= 195, HgA1c= 8.6.Marland KitchenMarland Kitchen rec to slowly incr her insulin doses. ~  labs 6/09 showed BS= 287, HgA1c= 8.4.Marland KitchenMarland Kitchen offered Endocrine referral but she refuses, rec incr Lantus & Humalog. ~  labs 1/10 showed BS= 255, HgA1c= 9.4.Marland Kitchen. rec> incr Lantus and Humalog slowly. ~  labs 5/10 showed BS= 264, A1c= 8.5.Marland KitchenMarland Kitchen still needs incr insulin. ~  labs 9/10 showed BS= 220, A1c= 8.1 ~  labs 3/11 showed BS= 181, A1c= 9.0..Marland Kitchen she is reluctant to incr insulin, rec to switch to LEVEMIR 40u/d (she was intol). ~  labs 9/11 showed BS= 257, A1c= 9.1.Marland KitchenMarland Kitchen rec slowly incr Lantus by 2u increments until FBS is <150... ~  Labs 10/12 on Lantus40+SSHumalog showed BS= 149, A1c= 9.4  HYPOTHYROIDISM (ICD-244.9) - on SYNTHROID 77mcg/d...   ~  labs 3/08 showed TSH = 2.0 ~  labs 3/09 showed TSH = 2.93 ~   labs 5/10 showed TSH = 3.46 ~  labs 3/11 showed TSH= 3.15  HIATAL HERNIA (ICD-553.3) - she is off her Protonix and taking PRILOSEC 20mg /d... ~  EGD 12/03 showed 5cmHH, tortuous & spastic esop, chr atrophic gastritis... Nexium recommended. ~  colonoscopy 12/03 was WNL x for decr rectal tone noted... incr fiber recommended.  DEGENERATIVE JOINT DISEASE (ICD-715.90) - "my back's in bad shape" per DrOlin & Shon Baton- nothing can be done surgically to help her... ~  5/09: fall w/ left hip fx- Rx DrOlin w/ closed reduction & percut screw fixation... she's been told she needs THR. ~  Back eval DrRamos 12/09 w/ epid steroid shot 1/10 without much benefit... ~  6/11:  she reports repeat epid shots by DrRamos helped somewhat & she will f/u w/ him... ~  9/11:  c/o hurting all over & TRAMADOL 50mg  Qid Prn tried...  OSTEOPOROSIS (ICD-733.00), & VITAMIN D DEFICIENCY (ICD-268.9) - followed by GYN= DrTomblin w/ BMD 12/09 per pt ?results... not on bisphos Rx- she takes Calcium, Vits, & had Vit D defic w/ level= 8 per pt hx treated transiently w/ 50K weekly- now on 1000 u daily & level pending at DrTomblin's office.  ANEMIA OF CHRONIC DISEASE (ICD-285.29) ~  labs 9/08 showed Hg= 11.3  ~  labs 3/09 showed Hg= 11.2 ~  labs 5/10 showed Hg= 11.4 ~  labs 3/11 showed Hg= 11.6 ~  Labs 6/12 showed Hg= 9.6   Past Surgical History  Procedure Date  . Abdominal aortogram 07/27/2003    by Dr. Maricela Curet  . Bilateral lower extremity angiogram 07/27/2003    by Dr. Maricela Curet  . Selective angiography,right superficial femoral artery, right and left iliac arteries 07/27/2003    by Dr. Maricela Curet  . Measurement of gradient right and left iliac arteries 07/27/2003    by Dr. Maricela Curet  . Bilateral cataract sugery 2000 and 2002    by Dr. Cecilie Kicks  . Repair left hip fracture 04/2008    by Dr. Charlann Boxer    Outpatient Encounter Prescriptions as of 09/01/2011  Medication Sig Dispense Refill  . acetaminophen (TYLENOL) 500 MG  tablet Take 500 mg by mouth every 6 (six) hours as needed.        Marland Kitchen amitriptyline (ELAVIL) 10 MG tablet Take 10 mg by mouth at bedtime as needed.       . Ascorbic Acid (VITAMIN C) 500 MG tablet Take 500 mg by mouth daily.        Marland Kitchen aspirin 81 MG tablet Take 81 mg by mouth daily.        Marland Kitchen atorvastatin (LIPITOR) 40 MG tablet Take 1 tablet (40 mg total) by mouth daily.  30 tablet  11  . Calcium Carbonate-Vitamin D (CALTRATE 600+D) 600-400 MG-UNIT per chew tablet Chew 1 tablet by mouth daily.        . carvedilol (COREG) 6.25 MG tablet Take 1 tablet (6.25 mg total) by mouth 2 (two) times daily with a meal.  60 tablet  11  . Cholecalciferol (VITAMIN D) 1000 UNITS capsule Take 1,000 Units by mouth daily.        . felodipine (PLENDIL) 10 MG 24 hr tablet Take 1 tablet (10 mg total) by mouth daily.  90 tablet  3  . furosemide (LASIX) 20 MG tablet Take 1 tablet (20 mg total) by mouth daily.  90 tablet  3  . insulin glargine (LANTUS) 100 UNIT/ML injection Inject 40 Units into the skin at bedtime.  40 mL  3  . insulin lispro (HUMALOG) 100 UNIT/ML injection Inject 5 to 15 units subcutaneously as needed before meals  10 mL  4  . levothyroxine (SYNTHROID, LEVOTHROID) 75 MCG tablet Take 1 tablet (75 mcg total) by mouth daily.  90 tablet  3  . multivitamin (THERAGRAN) per tablet Take 1 tablet by mouth daily.        . nitroGLYCERIN (NITROSTAT) 0.4 MG SL tablet Place 0.4 mg under the tongue every 5 (five) minutes as needed. May repeat up to 3 doses       . quinapril (ACCUPRIL) 40 MG tablet Take 1 tablet (40 mg total) by mouth daily.  90 tablet  3  . traMADol (ULTRAM) 50 MG tablet Take 50 mg by mouth every 6 (six) hours as needed.        Marland Kitchen DISCONTD: felodipine (PLENDIL) 10 MG 24 hr tablet Take 10 mg by mouth daily.        Marland Kitchen DISCONTD: furosemide (LASIX) 20 MG tablet TAKE 1 TABLET ONCE A DAY  90 tablet  2  . DISCONTD: levothyroxine (SYNTHROID, LEVOTHROID) 75 MCG tablet Take 75 mcg by mouth daily.        Marland Kitchen DISCONTD:  quinapril (ACCUPRIL) 40 MG tablet Take 40 mg by mouth daily.        . methocarbamol (ROBAXIN) 500 MG tablet Take 1 tablet (500 mg total) by mouth 3 (three) times daily as needed. For muscle spasms as needed  90 tablet  5    Allergies  Allergen Reactions  . Ibuprofen     REACTION: edema---thyroid problems  . Prednisone     Causes severe fluid retention    Current Medications, Allergies, Past Medical History, Past Surgical History, Family History, and Social History were reviewed in [employer] record.   Review of Systems   Constitutional:  Denies F/C/S, anorexia, unexpected weight change. HEENT:  Occas HAs; but no visual changes, earache, nasal symptoms, sore throat, hoarseness. Resp:  No cough, sputum, hemoptysis; no ch DOE, & no tightness or wheezing. Cardio:  No CP, palpit, orthopnea, edema. GI:  Denies N/V/D/C or blood in stool; no  reflux, abd pain, distention, or gas. GU:  No dysuria, freq, urgency, hematuria, or flank pain. MS:  Mod joint pains, w/ mild swelling, tenderness, & decr ROM... Neuro:  No tremors, seizures, dizziness, syncope; sl weakness & multifactorial gait abn. Skin:  No suspicious lesions or skin rash. Heme:  No adenopathy, bruising, bleeding. Psyche: Denies confusion, sleep disturbance, hallucinations; mod anxiety caring for husb (Alz).   Objective:   Physical Exam   WD, WN, 76 y/o WF in NAD; she is chr ill appearing... Vital Signs:  Reviewed...BP= 126/60 General:  Alert & oriented; pleasant & cooperative... HEENT:  [place]/AT, EOM-wnl, PERRLA, EACs-clear, TMs-wnl, NOSE-clear, THROAT-clear & wnl. Neck:  Sl tender w/ decr ROM; no JVD; normal carotid impulses w/o bruits; no thyromegaly or nodules palpated; no lymphadenopathy. Chest:  Clear to P & A; without wheezes/ rales/ or rhonchi heard... Heart:  Regular Rhythm; norm S1 & S2, & gr 1/6 SEM w/o rubs or gallops detected... Abdomen:  Soft & nontender; normal bowel sounds; no organomegaly  or masses palpated... Ext:  Decr ROM; +heberden's etc & mod arthritic changes; no varicose veins, +venous insuffic, tr edema;  Pulses intact w/o bruits... Neuro:  CNs II-XII intact; motor testing normal; sensory testing normal; abn gait & balance only fair... Derm:  No lesions noted; no rash etc... Lymph:  No cervical, supraclavicular, axillary, or inguinal adenopathy palpated...    Assessment & Plan:   DM> as above, on Lantus/ Humalog w/ BS up & down at home ==> FBS=149 & A1c=9.4; we reviewed diet/ exercise/ insulin adjustment based on her accuchecks.   Retinopathy> followed at [facility] (we don't have notes); she is blind in the right eye...     HBP> controlled on Coreg6.25Bid, Plendil10, Quinapril40, Lasix20; BP= 142/60 & even better at home she says...      CAD> She had extensive Cards eval by DrStuckey & DrWall; Caths 4/12 & 6/12 showed worrisome lesion in proxCFX- they decided on medical therapy (see below, & DrWalls notes are reviewed); she denies angina, palpit, ch in chr DOE, edema, etc...     ASPVD> CDopplers 7/12 stable, no cerebral ischemic symptoms; stable PAD w/o claud & known sm vessel dis distally, no LE lesions etc...     CHOL> on Lip40 & diet ==> FLP shows TChol 186, TG 125, HDL 51, LDL 110; she declines to incr dose or add Zetia...        Hypothy> stable on Levothy75...     GI> back on Protonix 40mg /d...     DJD>  Still having lots of back pain & followed by Mayford Knife; on Tramadol prn...     Anemia> she is off her prev iron therapy but still taking lots of Vits etc..Marland Kitchen

## 2011-09-28 NOTE — Telephone Encounter (Signed)
Pt wants talk about her cholesterol

## 2011-09-29 MED ORDER — PRAVASTATIN SODIUM 80 MG PO TABS: 80.0000 mg | ORAL_TABLET | Freq: Every day | ORAL | Status: DC

## 2011-09-29 NOTE — Telephone Encounter (Signed)
Dr. Daleen Squibb reviewed pt labs and recommended b/c of cost pt switch from atorvastatin to Pravastatin 80mg .  Pt will have repeat labs on 11/10/11 same day as appt with Dr. Daleen Squibb. Mylo Red RN

## 2011-11-05 ENCOUNTER — Telehealth: Payer: Self-pay | Admitting: Pulmonary Disease

## 2011-11-05 MED ORDER — ALPRAZOLAM 0.25 MG PO TABS: ORAL_TABLET | ORAL | Status: DC

## 2011-11-05 NOTE — Telephone Encounter (Signed)
Xanax 0.25mg  1/2-1 po daily As needed  Anxiety #30, no refills --may cause sedation  Stress reducers , if not improving will need ov  Please contact office for sooner follow up if symptoms do not improve or worsen or seek emergency care

## 2011-11-05 NOTE — Telephone Encounter (Signed)
Pt having trouble with increase anxiety since husband has been diagnosed with alzheimer's.  He is being moved from [facility] to a nursing home.  Pt would like something "mild" to help with her nerves.  Please advise.  Pt reports that she thinks Dr Kriste Basque has given her Librium in the past.  Nicole [name]

## 2011-11-05 NOTE — Telephone Encounter (Signed)
Called and spoke with pt.  Informed her of TP's response.  Rx sent to pharmacy.  Pt aware.

## 2011-11-10 ENCOUNTER — Ambulatory Visit: Payer: Medicare Other | Admitting: Cardiology

## 2011-11-10 ENCOUNTER — Other Ambulatory Visit: Payer: Medicare Other | Admitting: *Deleted

## 2011-12-15 ENCOUNTER — Encounter: Payer: Self-pay | Admitting: Internal Medicine

## 2012-01-18 ENCOUNTER — Telehealth: Payer: Self-pay | Admitting: Pulmonary Disease

## 2012-01-18 MED ORDER — AMOXICILLIN-POT CLAVULANATE 875-125 MG PO TABS: 1.0000 | ORAL_TABLET | Freq: Two times a day (BID) | ORAL | Status: AC

## 2012-01-18 NOTE — Telephone Encounter (Signed)
Pt advised of recs. rx sent.Carron Curie, CMA

## 2012-01-18 NOTE — Telephone Encounter (Signed)
atc line busy x 3 wcb 

## 2012-01-18 NOTE — Telephone Encounter (Signed)
I spoke with pt and she c/o nasal congestion, cough w/ yellow-green phlem and is blowing this out with bloody streaks, wheezing, chest tightness, sinus pressure. Cough seems worse at night. Denies any chills, sweats, fever, nausea, vomiting, sore throat. Pt is requesting to have something called in for her. Please advise Dr. Kriste Basque, thanks  Allergies  Allergen Reactions  . Ibuprofen     REACTION: edema---thyroid problems  . Prednisone     Causes severe fluid retention     [facility]

## 2012-01-18 NOTE — Telephone Encounter (Addendum)
Per SN---augmentin 875mg   #20  1 po bid, align otc 1 daily while taking the abx, nasal saline every 1-2 hours while awake, mucinex 2 po bid with plenty of fluids.  thanks

## 2012-01-21 ENCOUNTER — Ambulatory Visit (INDEPENDENT_AMBULATORY_CARE_PROVIDER_SITE_OTHER): Payer: Medicare Other | Admitting: Cardiology

## 2012-01-21 ENCOUNTER — Telehealth: Payer: Self-pay | Admitting: Cardiology

## 2012-01-21 ENCOUNTER — Encounter: Payer: Self-pay | Admitting: Cardiology

## 2012-01-21 VITALS — BP 150/52 | HR 63 | Wt 143.0 lb

## 2012-01-21 DIAGNOSIS — I509 Heart failure, unspecified: Secondary | ICD-10-CM

## 2012-01-21 DIAGNOSIS — R600 Localized edema: Secondary | ICD-10-CM | POA: Insufficient documentation

## 2012-01-21 DIAGNOSIS — R609 Edema, unspecified: Secondary | ICD-10-CM

## 2012-01-21 NOTE — Telephone Encounter (Signed)
Routing error. Please call pt.

## 2012-01-21 NOTE — Telephone Encounter (Signed)
New Msg: Pt calling wanting to speak with nurse regarding pt c/o weakness in chest, swelling in legs, and sob. Pt c/o sob off and on. Pt said symptoms have been present for almost one month.    Please return pt call to discuss further.

## 2012-01-21 NOTE — Telephone Encounter (Signed)
Pt calling complaining of swelling from ankles to top of calf (bil) x 3 weeks.  Right greater than left.  Both are painful to touch.  She thinks the swelling my be getting a little better.  Right calf feels hard to the touch.  The right leg also has small blisters on it.  No drainage.  No discoloration.  She is also complaining of a cough x 1 month.  Dry during the day and productive (greenish yellow) at night.  The cough was preceded by wheezing.  Treated with Augmentin by Dr Kriste Basque which helped her sinus infection but not her cough.  She is currently taking mucinex bid.  She has had sob x one month off and on.  Both at rest and with exertion.

## 2012-01-21 NOTE — Assessment & Plan Note (Signed)
She has a history of this past. We'll increase her Lasix to 40 mg a day for the next week. Hopefully can get her weight down 4-5 pounds. Will check electrolytes next week. I will see her back for close followup in 2-3 weeks.

## 2012-01-21 NOTE — Progress Notes (Signed)
HPI [name] in today with a chief complaint of shortness of breath off and on the day and some increased swelling in her legs.  Her husband died November 28, 2023. Her weight dropped from 143-132. She is now back up to 143 today.  She denies orthopnea or PND. She denies chest pain.  Her current dose of Lasix is 20 mg per day. She is compliant with her medications.  Past Medical History  Diagnosis Date  . Retinopathy   . Hypertension   . CHF (congestive heart failure)   . CAD (coronary artery disease)   . Cerebrovascular disease   . Peripheral vascular disease   . Hypercholesterolemia   . Diabetes mellitus   . Hypothyroidism   . Hiatal hernia   . Degenerative joint disease   . Osteoporosis   . Vitamin d deficiency   . Anemia of chronic disease     Current Outpatient Prescriptions  Medication Sig Dispense Refill  . acetaminophen (TYLENOL) 500 MG tablet Take 500 mg by mouth every 6 (six) hours as needed.        Marland Kitchen amoxicillin-clavulanate (AUGMENTIN) 875-125 MG per tablet Take 1 tablet by mouth 2 (two) times daily.  20 tablet  0  . aspirin 81 MG tablet Take 81 mg by mouth daily.        . carvedilol (COREG) 6.25 MG tablet Take 1 tablet (6.25 mg total) by mouth 2 (two) times daily with a meal.  60 tablet  11  . Cholecalciferol (VITAMIN D) 1000 UNITS capsule Take 1,000 Units by mouth daily.        . felodipine (PLENDIL) 10 MG 24 hr tablet Take 1 tablet (10 mg total) by mouth daily.  90 tablet  3  . furosemide (LASIX) 20 MG tablet Take 1 tablet (20 mg total) by mouth daily.  90 tablet  3  . guaiFENesin (MUCINEX) 600 MG 12 hr tablet Take 1,200 mg by mouth 2 (two) times daily.      . insulin glargine (LANTUS) 100 UNIT/ML injection Inject 40 Units into the skin at bedtime.  40 mL  3  . insulin lispro (HUMALOG) 100 UNIT/ML injection Inject 5 to 15 units subcutaneously as needed before meals  10 mL  4  . multivitamin (THERAGRAN) per tablet Take 1 tablet by mouth daily.        .  nitroGLYCERIN (NITROSTAT) 0.4 MG SL tablet Place 0.4 mg under the tongue every 5 (five) minutes as needed. May repeat up to 3 doses       . pravastatin (PRAVACHOL) 40 MG tablet 1 1/2 tab po qd      . quinapril (ACCUPRIL) 40 MG tablet Take 1 tablet (40 mg total) by mouth daily.  90 tablet  3  . methocarbamol (ROBAXIN) 500 MG tablet Take 1 tablet (500 mg total) by mouth 3 (three) times daily as needed. For muscle spasms as needed  90 tablet  5  . pravastatin (PRAVACHOL) 80 MG tablet Take 40 mg by mouth daily.        Allergies  Allergen Reactions  . Ibuprofen     REACTION: edema---thyroid problems  . Prednisone     Causes severe fluid retention    No family history on file.  History   Social History  . Marital Status: Married    Spouse Name: Nicole [name]    Number of Children: 2  . Years of Education: N/A   Occupational History  . retired    Social History Main  Topics  . Smoking status: Former Smoker    Types: Cigarettes    Quit date: 12/07/1974  . Smokeless tobacco: Never Used   Comment: pt started smoking in 1956  . Alcohol Use: No  . Drug Use: No  . Sexually Active: Not on file   Other Topics Concern  . Not on file   Social History Narrative   Married to [employer] has kidney and pancrease transplant    ROS ALL NEGATIVE EXCEPT THOSE NOTED IN HPI  PE  General Appearance: well developed, well nourished in no acute distress HEENT: symmetrical face, PERRLA, good dentition  Neck: no JVD, thyromegaly, or adenopathy, trachea midline Chest: symmetric without deformity Cardiac: PMI non-displaced, RRR, normal S1, S2, no gallop or murmur Lung: clear to ausculation and percussion Vascular: all pulses full without bruits  Abdominal: nondistended, nontender, good bowel sounds, no HSM, no bruits Extremities: no cyanosis, clubbing , 1-2+ pitting edema in addition to her thickened skin, no sign of DVT, no varicosities  Skin: normal color, no  rashes Neuro: alert and oriented x 3, non-focal Pysch: Depressed  EKG  BMET    Component Value Date/Time   NA 140 09/23/2011 0839   K 3.9 09/23/2011 0839   CL 103 09/23/2011 0839   CO2 29 09/23/2011 0839   GLUCOSE 149* 09/23/2011 0839   BUN 19 09/23/2011 0839   CREATININE 0.9 09/23/2011 0839   CALCIUM 9.3 09/23/2011 0839   GFRNONAA >60 05/09/2011 0500   GFRAA  Value: >60        The eGFR has been calculated using the MDRD equation. This calculation has not been validated in all clinical situations. eGFR's persistently <60 mL/min signify possible Chronic Kidney Disease. 05/09/2011 0500    Lipid Panel     Component Value Date/Time   CHOL 186 09/23/2011 0839   TRIG 125.0 09/23/2011 0839   HDL 51.20 09/23/2011 0839   CHOLHDL 4 09/23/2011 0839   VLDL 25.0 09/23/2011 0839   LDLCALC 110* 09/23/2011 0839    CBC    Component Value Date/Time   WBC 4.4 05/09/2011 0500   RBC 3.22* 05/09/2011 0500   HGB 9.6* 05/09/2011 0500   HCT 28.4* 05/09/2011 0500   PLT 164 05/09/2011 0500   MCV 88.2 05/09/2011 0500   MCH 29.8 05/09/2011 0500   MCHC 33.8 05/09/2011 0500   RDW 13.8 05/09/2011 0500   LYMPHSABS 1.2 04/29/2011 1523   MONOABS 0.5 04/29/2011 1523   EOSABS 0.1 04/29/2011 1523   BASOSABS 0.0 04/29/2011 1523

## 2012-01-21 NOTE — Telephone Encounter (Signed)
Will forward to Dr. Daleen Squibb.

## 2012-01-21 NOTE — Telephone Encounter (Signed)
Pt is coming in to see Dr. Daleen Squibb this afternoon.

## 2012-01-21 NOTE — Patient Instructions (Addendum)
Your physician recommends that you schedule a follow-up appointment in: 3 weeks.  Return for lab work on 01/29/2012.  BMET  Increase lasix to 40mg  daily.  Try to lose 4-5 pounds of fluid weight.  Weigh daily in the morning and keep a log.

## 2012-01-29 ENCOUNTER — Other Ambulatory Visit (INDEPENDENT_AMBULATORY_CARE_PROVIDER_SITE_OTHER): Payer: Medicare Other

## 2012-01-29 ENCOUNTER — Telehealth: Payer: Self-pay | Admitting: Cardiology

## 2012-01-29 DIAGNOSIS — I509 Heart failure, unspecified: Secondary | ICD-10-CM

## 2012-01-29 LAB — BASIC METABOLIC PANEL
Chloride: 104 mEq/L (ref 96–112)
GFR: 46.12 mL/min — ABNORMAL LOW (ref >60.00)
Potassium: 4.1 mEq/L (ref 3.5–5.1)

## 2012-01-29 NOTE — Telephone Encounter (Signed)
Patient states has been having LE pain. When she was seen on 02/14 /13 for SOB and LE edema, lasix dose was increased to 20 mg daily, she has not diurese much, but her LE are not swollen now, she has LE   Pain, and  calf feels hard to touch and continue to SOB. Pt's  weigh is 141 lbs today. She is for blood work today, she would like to be seen then.

## 2012-01-29 NOTE — Telephone Encounter (Signed)
Correction: patient's lasix dose was increased by Dr. Daleen Squibb  To 40 mg daily on 01/21/12 .

## 2012-01-29 NOTE — Telephone Encounter (Signed)
Pt is so weak and wants to be seen today when she comes for blood work if at all possible

## 2012-01-29 NOTE — Telephone Encounter (Signed)
Pt came in today for labs. She felt her legs were still swollen. Her weight in the office is 140. Down 3 pounds from 01/21/12. She is not short of breath. She did have some edema in the calf right greater than left with no edema of the ankles bilaterally. Her calves were not red nor warm.  She says her weight is back at baseline of 140-141.   Will continue to elevate legs, increase fluids, daily weights. She does think her Diabetes is the cause of the "pins & needles" she feels in them occasionally. She is still trying to cope with her husbands' recent death. Reassurance given and pt was able to express herself.  She tries not to cry in front of her children and "upset them".     She also feels she is probably dehydrated. Drinks 3 cups of coffee /day and no other fluids. Discussed preventing dehydration. Reassurance given to pt. She does not feel she is having any angina.  Follow-up with Dr. Daleen Squibb on 02/18/12 Mylo Red RN

## 2012-02-18 ENCOUNTER — Encounter: Payer: Self-pay | Admitting: Cardiology

## 2012-02-18 ENCOUNTER — Ambulatory Visit (INDEPENDENT_AMBULATORY_CARE_PROVIDER_SITE_OTHER): Payer: Medicare Other | Admitting: Cardiology

## 2012-02-18 VITALS — BP 166/50 | HR 64 | Ht 63.0 in | Wt 138.0 lb

## 2012-02-18 DIAGNOSIS — E1142 Type 2 diabetes mellitus with diabetic polyneuropathy: Secondary | ICD-10-CM

## 2012-02-18 DIAGNOSIS — E1149 Type 2 diabetes mellitus with other diabetic neurological complication: Secondary | ICD-10-CM

## 2012-02-18 DIAGNOSIS — I739 Peripheral vascular disease, unspecified: Secondary | ICD-10-CM

## 2012-02-18 DIAGNOSIS — G909 Disorder of the autonomic nervous system, unspecified: Secondary | ICD-10-CM

## 2012-02-18 DIAGNOSIS — I509 Heart failure, unspecified: Secondary | ICD-10-CM

## 2012-02-18 DIAGNOSIS — E119 Type 2 diabetes mellitus without complications: Secondary | ICD-10-CM

## 2012-02-18 DIAGNOSIS — R600 Localized edema: Secondary | ICD-10-CM

## 2012-02-18 DIAGNOSIS — R609 Edema, unspecified: Secondary | ICD-10-CM

## 2012-02-18 DIAGNOSIS — I679 Cerebrovascular disease, unspecified: Secondary | ICD-10-CM

## 2012-02-18 DIAGNOSIS — I2510 Atherosclerotic heart disease of native coronary artery without angina pectoris: Secondary | ICD-10-CM

## 2012-02-18 MED ORDER — GABAPENTIN 100 MG PO CAPS: 100.0000 mg | ORAL_CAPSULE | Freq: Three times a day (TID) | ORAL | Status: DC

## 2012-02-18 NOTE — Patient Instructions (Signed)
Your physician recommends that you schedule a follow-up appointment in: 3 months with Dr. Daleen Squibb.   Your physician has recommended you make the following change in your medication: Gabapentin 100mg  three times per day.

## 2012-02-18 NOTE — Assessment & Plan Note (Signed)
I tried to call her primary care for him. After not being able to reach him, and with her having so much discomfort, we'll start her on gabapentin per our Pharm D recommendations. She has an appointment with her primary care in several weeks for followup.

## 2012-02-18 NOTE — Assessment & Plan Note (Signed)
Back to baseline. No change in current therapy including the increased dose of Lasix. I will followup with her in 3 months.

## 2012-02-18 NOTE — Progress Notes (Signed)
HPI Nicole [name] returns for close followup of her history of right-sided heart failure and peripheral edema. On last visit she gained a substantial amount of weight. We increased her Lasix and now her weight has dropped 5 pounds back to baseline. Complaining of a lot of aching in itching and burning in her legs. He does not carry a diagnosis of diabetic neuropathy but this is most likely the case. She  carries a diagnosis of retinopathy.  Followup metabolic profile showed a slight increase in her BUN and creatinine but stable overall. We will have to run her a little more dry to avoid the edema  Past Medical History  Diagnosis Date  . Retinopathy   . Hypertension   . CHF (congestive heart failure)   . CAD (coronary artery disease)   . Cerebrovascular disease   . Peripheral vascular disease   . Hypercholesterolemia   . Diabetes mellitus   . Hypothyroidism   . Hiatal hernia   . Degenerative joint disease   . Osteoporosis   . Vitamin d deficiency   . Anemia of chronic disease     Current Outpatient Prescriptions  Medication Sig Dispense Refill  . acetaminophen (TYLENOL) 500 MG tablet Take 500 mg by mouth every 6 (six) hours as needed.        Marland Kitchen aspirin 81 MG tablet Take 81 mg by mouth daily.        . carvedilol (COREG) 6.25 MG tablet Take 1 tablet (6.25 mg total) by mouth 2 (two) times daily with a meal.  60 tablet  11  . Cholecalciferol (VITAMIN D) 1000 UNITS capsule Take 1,000 Units by mouth daily.        . felodipine (PLENDIL) 10 MG 24 hr tablet Take 1 tablet (10 mg total) by mouth daily.  90 tablet  3  . furosemide (LASIX) 20 MG tablet Take 20 mg by mouth 2 (two) times daily.      . insulin glargine (LANTUS) 100 UNIT/ML injection Inject 40 Units into the skin at bedtime.  40 mL  3  . insulin lispro (HUMALOG) 100 UNIT/ML injection Inject 5 to 15 units subcutaneously as needed before meals  10 mL  4  . multivitamin (THERAGRAN) per tablet Take 1 tablet by mouth daily.        .  nitroGLYCERIN (NITROSTAT) 0.4 MG SL tablet Place 0.4 mg under the tongue every 5 (five) minutes as needed. May repeat up to 3 doses       . pantoprazole (PROTONIX) 40 MG tablet Take 1 tablet by mouth Daily.      . pravastatin (PRAVACHOL) 40 MG tablet 1 1/2 tab po qd      . quinapril (ACCUPRIL) 40 MG tablet Take 1 tablet (40 mg total) by mouth daily.  90 tablet  3  . SYNTHROID 75 MCG tablet Take 1 tablet by mouth Daily.      . methocarbamol (ROBAXIN) 500 MG tablet Take 1 tablet (500 mg total) by mouth 3 (three) times daily as needed. For muscle spasms as needed  90 tablet  5    Allergies  Allergen Reactions  . Ibuprofen     REACTION: edema---thyroid problems  . Prednisone     Causes severe fluid retention    No family history on file.  History   Social History  . Marital Status: Married    Spouse Name: Llana Aliment. Barrales    Number of Children: 2  . Years of Education: N/A   Occupational History  .  retired    Social History Main Topics  . Smoking status: Former Smoker    Types: Cigarettes    Quit date: 12/07/1974  . Smokeless tobacco: Never Used   Comment: pt started smoking in 1956  . Alcohol Use: No  . Drug Use: No  . Sexually Active: Not on file   Other Topics Concern  . Not on file   Social History Narrative   Married to [employer] has kidney and pancrease transplant    ROS ALL NEGATIVE EXCEPT THOSE NOTED IN HPI  PE  General Appearance: well developed, well nourished in no acute distress HEENT: symmetrical face, PERRLA, good dentition  Neck: no JVD, thyromegaly, or adenopathy, trachea midline Chest: symmetric without deformity Cardiac: PMI non-displaced, RRR, normal S1, S2, no gallop or murmur Lung: clear to ausculation and percussion Vascular: all pulses full without bruits  Abdominal: nondistended, nontender, good bowel sounds, no HSM, no bruits Extremities: no cyanosis, clubbing or edema, no sign of DVT, no varicosities  Skin: normal  color, no rashes Neuro: alert and oriented x 3, non-focal Pysch: normal affect  EKG  BMET    Component Value Date/Time   NA 135 01/29/2012 1231   K 4.1 01/29/2012 1231   CL 104 01/29/2012 1231   CO2 24 01/29/2012 1231   GLUCOSE 324* 01/29/2012 1231   BUN 25* 01/29/2012 1231   CREATININE 1.2 01/29/2012 1231   CALCIUM 8.7 01/29/2012 1231   GFRNONAA >60 05/09/2011 0500   GFRAA  Value: >60        The eGFR has been calculated using the MDRD equation. This calculation has not been validated in all clinical situations. eGFR's persistently <60 mL/min signify possible Chronic Kidney Disease. 05/09/2011 0500    Lipid Panel     Component Value Date/Time   CHOL 186 09/23/2011 0839   TRIG 125.0 09/23/2011 0839   HDL 51.20 09/23/2011 0839   CHOLHDL 4 09/23/2011 0839   VLDL 25.0 09/23/2011 0839   LDLCALC 110* 09/23/2011 0839    CBC    Component Value Date/Time   WBC 4.4 05/09/2011 0500   RBC 3.22* 05/09/2011 0500   HGB 9.6* 05/09/2011 0500   HCT 28.4* 05/09/2011 0500   PLT 164 05/09/2011 0500   MCV 88.2 05/09/2011 0500   MCH 29.8 05/09/2011 0500   MCHC 33.8 05/09/2011 0500   RDW 13.8 05/09/2011 0500   LYMPHSABS 1.2 04/29/2011 1523   MONOABS 0.5 04/29/2011 1523   EOSABS 0.1 04/29/2011 1523   BASOSABS 0.0 04/29/2011 1523

## 2012-02-29 ENCOUNTER — Encounter: Payer: Self-pay | Admitting: Pulmonary Disease

## 2012-02-29 ENCOUNTER — Other Ambulatory Visit (INDEPENDENT_AMBULATORY_CARE_PROVIDER_SITE_OTHER): Payer: Medicare Other

## 2012-02-29 ENCOUNTER — Ambulatory Visit (INDEPENDENT_AMBULATORY_CARE_PROVIDER_SITE_OTHER): Payer: Medicare Other | Admitting: Pulmonary Disease

## 2012-02-29 VITALS — BP 126/68 | HR 57 | Temp 97.1°F | Ht 63.0 in | Wt 142.0 lb

## 2012-02-29 DIAGNOSIS — R0989 Other specified symptoms and signs involving the circulatory and respiratory systems: Secondary | ICD-10-CM

## 2012-02-29 DIAGNOSIS — I509 Heart failure, unspecified: Secondary | ICD-10-CM

## 2012-02-29 DIAGNOSIS — M1990 Unspecified osteoarthritis, unspecified site: Secondary | ICD-10-CM

## 2012-02-29 DIAGNOSIS — E1142 Type 2 diabetes mellitus with diabetic polyneuropathy: Secondary | ICD-10-CM

## 2012-02-29 DIAGNOSIS — D649 Anemia, unspecified: Secondary | ICD-10-CM

## 2012-02-29 DIAGNOSIS — I2510 Atherosclerotic heart disease of native coronary artery without angina pectoris: Secondary | ICD-10-CM

## 2012-02-29 DIAGNOSIS — I10 Essential (primary) hypertension: Secondary | ICD-10-CM

## 2012-02-29 DIAGNOSIS — E039 Hypothyroidism, unspecified: Secondary | ICD-10-CM

## 2012-02-29 DIAGNOSIS — E538 Deficiency of other specified B group vitamins: Secondary | ICD-10-CM

## 2012-02-29 DIAGNOSIS — I679 Cerebrovascular disease, unspecified: Secondary | ICD-10-CM

## 2012-02-29 DIAGNOSIS — K219 Gastro-esophageal reflux disease without esophagitis: Secondary | ICD-10-CM

## 2012-02-29 DIAGNOSIS — R0600 Dyspnea, unspecified: Secondary | ICD-10-CM

## 2012-02-29 DIAGNOSIS — E119 Type 2 diabetes mellitus without complications: Secondary | ICD-10-CM

## 2012-02-29 DIAGNOSIS — M810 Age-related osteoporosis without current pathological fracture: Secondary | ICD-10-CM

## 2012-02-29 DIAGNOSIS — J387 Other diseases of larynx: Secondary | ICD-10-CM

## 2012-02-29 LAB — CBC WITH DIFFERENTIAL/PLATELET
Basophils Absolute: 0 10*3/uL (ref 0.0–0.1)
Eosinophils Absolute: 0.1 10*3/uL (ref 0.0–0.7)
HCT: 28.5 % — ABNORMAL LOW (ref 36.0–46.0)
Hemoglobin: 9.4 g/dL — ABNORMAL LOW (ref 12.0–15.0)
Lymphs Abs: 1.2 10*3/uL (ref 0.7–4.0)
MCHC: 33 g/dL (ref 30.0–36.0)
Monocytes Absolute: 0.8 10*3/uL (ref 0.1–1.0)
Monocytes Relative: 12.6 % — ABNORMAL HIGH (ref 3.0–12.0)
Neutro Abs: 3.9 10*3/uL (ref 1.4–7.7)
Platelets: 239 10*3/uL (ref 150.0–400.0)
RDW: 13.2 % (ref 11.5–14.6)

## 2012-02-29 LAB — HEPATIC FUNCTION PANEL
AST: 24 U/L (ref 0–37)
Albumin: 3.3 g/dL — ABNORMAL LOW (ref 3.5–5.2)
Alkaline Phosphatase: 135 U/L — ABNORMAL HIGH (ref 39–117)
Bilirubin, Direct: 0.2 mg/dL (ref 0.0–0.3)

## 2012-02-29 LAB — HEMOGLOBIN A1C: Hgb A1c MFr Bld: 9.4 % — ABNORMAL HIGH (ref 4.6–6.5)

## 2012-02-29 LAB — TSH: TSH: 5.69 u[IU]/mL — ABNORMAL HIGH (ref 0.35–5.50)

## 2012-02-29 LAB — BASIC METABOLIC PANEL
CO2: 24 mEq/L (ref 19–32)
Calcium: 9.2 mg/dL (ref 8.4–10.5)
GFR: 42.83 mL/min — ABNORMAL LOW (ref >60.00)
Glucose, Bld: 83 mg/dL (ref 70–99)
Potassium: 4.5 mEq/L (ref 3.5–5.1)
Sodium: 140 mEq/L (ref 135–145)

## 2012-02-29 LAB — IBC PANEL: Saturation Ratios: 7.9 % — ABNORMAL LOW (ref 20.0–50.0)

## 2012-02-29 MED ORDER — PANTOPRAZOLE SODIUM 40 MG PO TBEC: 40.0000 mg | DELAYED_RELEASE_TABLET | Freq: Two times a day (BID) | ORAL | Status: DC

## 2012-02-29 MED ORDER — LEVOTHYROXINE SODIUM 75 MCG PO TABS: 75.0000 ug | ORAL_TABLET | Freq: Every day | ORAL | Status: DC

## 2012-02-29 NOTE — Patient Instructions (Signed)
Today we updated your med list in our EPIC system...    Continue your current medications the same...  Today we did your follow up blood work...    We will call you w/ the results when avail...  Your throat symptoms are likely coming from REFLUX Disease>>    Elevate the head of your bed on 6" blocks...    Increase the PROTONIX to 40mg  twice daily taken 30 min before breakfast & dinner...    Try not to eat or drink much after dinner in the eve (this allows your stomach to totally empty out before bedtime...       -increase your fluid intake during the day...  For your nighttime drainage>>    Try to take an antihist like Claritin or Zyrtek (OTC) at bedtime (about an hour before w/ sip of water)...    Try the new Dymista 2sp in each nostril at bedtime...  Call for any questions...  Let's plan a follow up appt in about 4 months.Marland KitchenMarland Kitchen

## 2012-02-29 NOTE — Progress Notes (Addendum)
Subjective:    Patient ID: Nicole [name], female    DOB: 02-09-1935, 77 y.o.   MRN: [id number]  HPI 77 y/o WF here for a follow up visit... she has mult med problems including IDDM w/ hx poor control;  HBP;  CAD followed by DrWall;  Cerebrovasc & Peripheral vasc dis;  Hyperlipidemia;  Hypothyroidism;  Large HH;  DJD w/ neck & LBP;  Osteoporosis & Vit D defic;  Hx Anemia...  ~  February 24, 2011:  6 month ROV & c/o left sided neck pain x 20mo started while making fudge in Dec> c/o tight leaders, popping & cracking, HA, etc; radiating to shoulder & up to head, no weakness/ numbness, etc;  She has been evaluated by DrBrooks, [facility], in the past w/ prev XRays she says (he wants to give shots, she declined- we don't have records);  She can't get MRIs due to screws in left hip;  We discussed rx w/ Pred Dosepak, Tramadol/ Tylenol. Add Robaxin 500mg Tid, rest/ heat, & she will call DrBrooks for f/u...    Ortho followed by DrOlin/ Ramos at [facility] for prev left fem neck fracture 5/09 w/ closed reduction & percut screw fixation... severe LBP & pain all over- given epid steroid shots w/ min relief; pain Rx w/ DCN in the past was too strong (sedated) therefore try Tramadol...    Cards followed by DrWall- HBP, diffuse vascular dis, r/o CAD, hyperlipidemia> stable w/ severe disease on Atenolol, Quinapril, Felodipine, Lasix;  BP today= 126/60 & denies CP, palpit, change in dyspnea, edema, etc... She is way too sedentary because of her Ortho problems... She last saw DrWall 11/11 & his note is reviewed- stable, no changes made...    Hyperchol>  She had FLP done by [facility] 1/12 on Crestor20mg /d w/ TChol 166, TG 122, HDL 43, LDL 99> he wanted to incr the Crestor to 40mg  but pt declined 7 preferred to keep it at 20mg /d & work on diet & wt reduction...    Her main problem is brittle DM> difficult contol on Lantus/ Humalog w/ freq adjustments & tried on Levemir but intol ("the shots burned & made me weak")... she  refused Endocrine referral... she has DM retinopathy w/ macular edema followed in the [facility] clinic... We have been trying to get her to slowly increase her Lantus by 2u increments titrating it to her FBS;  But she has refused, thwarted our attempts at better control by always returning her Lantus to 40u daily;  We discussed this strategy again today...  ~  August 31, 2011:  13mo ROV & she complains of freq hypoglycemia in early AM hours- she's been taking Lantus 40u and Humalog SS betw 7-10u at mealtimes; her accuchecks are all over the place & she will ret for FASTING blood work (see below)>>    Retinopathy> followed at [facility] (we don't have notes); she is blind in the right eye...    HBP> controlled on Coreg6.25Bid, Plendil10, Quinapril40, Lasix20; BP= 142/60 & even better at home she says...     CAD> She had extensive Cards eval by DrStuckey & DrWall; Caths 4/12 & 6/12 showed worrisome lesion in proxCFX- they decided on medical therapy (see below, & DrWalls notes are reviewed); she denies angina, palpit, ch in chr DOE, edema, etc...    ASPVD> CDopplers 7/12 stable, no cerebral ischemic symptoms; stable PAD w/o claud & known sm vessel dis distally, no LE lesions etc...    CHOL> on Lip40 & diet ==> FLP  shows TChol 186, TG 125, HDL 51, LDL 110    DM> as above, on Lantus/ Humalog w/ BS up & down at home ==> FBS=149 & A1c=9.4    Hypothy> stable on Levothy75...    GI> back on Protonix 40mg /d...    DJD>  Still having lots of back pain & followed by Mayford Knife; on Tramadol prn...    Anemia> she is off her prev iron therapy but still taking lots of Vits etc...  ~  February 29, 2012:  62mo ROV & she has mult somatic complaints> c/o sinusitis w/ drainage/ cough/ hoarse, drainage wakes her at night, no energy, incr SOB, etc;  Also notes legs hurt, tender, "my legs are hard", hurts to walk, more sedentary; DrWall started Neurontin 100mg  Tid for prob neuropathy related to her DM;  Husb passed away 12/12 w/  severe dementia, pneumonia, UTI, in hosp 3wks...    Retinopathy> followed at [facility]: [facility] Rhonda W (we don't have notes); she is blind in the right eye...    HBP> controlled on Coreg6.25Bid, Plendil10, Quinapril40, Lasix20Bid; BP= 126/68 & similar at home she says...     CAD> She had extensive Cards eval by DrStuckey & DrWall; Caths 4/12 & 6/12 showed worrisome lesion in proxCFX- they decided on medical therapy (see below, & DrWall's notes are reviewed); she denies angina, palpit, ch in chr DOE, edema, etc...    ASPVD> CDopplers 7/12 stable, no cerebral ischemic symptoms; stable PAD w/o claud & known sm vessel dis distally, no LE lesions etc...    CHOL> on Prav40 now per DrWall, plus diet, not fasting today; FLP 9/12 on Lip40 showed TChol 186, TG 125, HDL 51, LDL 110    DM> on EXBMWU13 +SSI Humalog5-15Tid w/ BS up & down at home ==> FBS=83 & A1c=9.4    Hypothy> stable on Levothy75...    GI> back on Protonix 40mg /d...    DJD>  Still having lots of back pain & followed by [facility]; on Tramadol prn & Neurontin 100Tid added by DrWall for leg pain, prob neuropathy...    Anemia> she is off her prev iron therapy but still taking lots of Vits etc; Hg=9.4, Fe=30, B12=215, SPE= ok... LABS 3/25:  Chems- ok x BS=83 A1c=9.4 BUN=31 Creat=1.3 BNP=265;  CBC- Hg=9.4 Fe=30(8%), B12=215;  SPE/IEP- neg no monoclonal prot & Ig's ok...          Problem List:  RETINOPATHY (ICD-363.20) - followed at [facility] DrKarup (we don't have notes)... 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  HYPERTENSION (ICD-401.9) - now on COREG 6.25mg Bid, QUINAPRIL 40mg /d, FELODIPINE 10mg /d & LASIX 20mg /d...  ~  9/12: BP= 142/60 & 120-130 at home; she denies CP, palpit, change in dyspnea, PND, etc... ~  3/13: BP= 126/68 & similar at home; she is too sedentary but denies CP, palpit, edema...  CAD (ICD-414.00) &  CHRONIC DIASTOLIC CHF - on MEDS above... DrWall's & DrStuckey's notes are reviewed-  denies recurrent angina, palpit, change in dyspnea or edema... we reviewed risk factor reduction strategy... ~  Prior stent in marginal branch of Circ in 1998 ~  Myoview 4/10 was negative- no scar or ischemia, EF= 73%... ~  labs 3/11 showed BUN=18, Crear=1.0, BNP=107, & EKG essent WNL.Marland Kitchen. ~  4/12:  Hosp w/ Diastolic CHF after Pred Rx and fluid retention; diuresed & improved... ~  2DEcho 4/12 showed EF 65-70% with normal wall motion & grade 2 DD, mildMR, mild LAdil... ~  Cath  4/12 showed  50% midLAD, 90% calcified stenosis in proxCIRC, patent stent in second obtuse marginal, 70-80% proxRCA (non-dominent, sm vessel), EF=65%, LVEDP=74mmHg. ~  [facility] 6/12 for re-cath by DrStuckey> He discussed results in detail with the patient, explaining that she "did not have evidence of USAP, with an FFR of 0.96> medical therapy was recommended".  CEREBROVASCULAR DISEASE (ICD-437.9) - on ASA daily... no cerebral ischemic symptoms. ~  CDoppler 4/08 showing stable bilat carotid dis w/ 40-59% bilat stenoses... ~  repeat studies 4/09 showed mild smooth heterogeneous plaque w/ 40-59% bilat ICA stenoses. ~  f/u studies 4/10 showed mild to mod heterogen plaque bilat- 40-59% bilat stenoses, stable. ~  CDopplers 5/11 showed stable mild irreg plaque, 40-59% bilat ICAstenoses, no change. ~  CDopplers 7/12 showed stable mild heterogeneous plaque bilat w/ 40-59% bilat ICA stenoses; f/u planned 73yr...  PERIPHERAL VASCULAR DISEASE (ICD-443.9) - abnormal ABI's and eval by DrDowney in 3/08 (& prev DrGupta 2004)... she had arteriogram w/ bilat dis but no indication for PTA... 20% bilat ostial renal lesions also noted... no complaints of claudication etc, ambulates w/ cane, BP controlled. ~  LE Vasc study 4/10 showed known diffuse bilat arterial occlusive dis- stable large vessel dis & progressive sm vessel dis w/ toe/ brachial indicies at risk for tissue loss... DrWall is following.  HYPERCHOLESTEROLEMIA (ICD-272.0) - now on LIPITOR  40mg /d + FishOil & CoQ10... ~  FLP 3/08 showed TChol 158, Tg 119, HDL 43, LDL 92 ~  FLP 3/09 showed TChol 173, TG 151, HDL 41, LDL 102 ~  FLP 1/10 off med x 64mo showed TChol 349, TG 173, HDL 45, LDL 258... rec change Statin, but she prefers Cres20. ~  FLP 5/10 on Cres20 showed TChol 179, TG 113, HDL 52, LDL 104... rec> continue Cres20. ~  FLP 3/11 on Cres20 showed TChol 179, TG 125, HDL 50, LDL 104... She would not incr dose & wanted off Cres due to $$ & aching. ~  FLP 10/12 on Lip40 showed TChol 186, TG 125, HDL 51, LDL 110... She will not incr the dose. ~  DrWall switched her to PRAVASTATIN 40mg  taking 1.5 tab Qd...  DIABETES MELLITUS (ICD-250.00) - very brittle DM, insulin dependent and prev Rx by Endocrinology... she has retinopathy, neuropathy, and nephropathy w/ proteinuria noted (on ACE & Felodipine)... currently taking LANTUS 40 u daily, and HUMALOG 5-15 before each meal... we have discussed adjustments designed to improve her overall control without resulting hypoglycemia... ~  labs 9/08 showed BS= 171, HgA1c= 8.7 ~  labs 3/09 showed BS= 195, HgA1c= 8.6.Marland KitchenMarland Kitchen rec to slowly incr her insulin doses. ~  labs 6/09 showed BS= 287, HgA1c= 8.4.Marland KitchenMarland Kitchen offered Endocrine referral but she refuses, rec incr Lantus & Humalog. ~  labs 1/10 showed BS= 255, HgA1c= 9.4.Marland Kitchen. rec> incr Lantus and Humalog slowly. ~  labs 5/10 showed BS= 264, A1c= 8.5.Marland KitchenMarland Kitchen still needs incr insulin. ~  labs 9/10 showed BS= 220, A1c= 8.1 ~  labs 3/11 showed BS= 181, A1c= 9.0..Marland Kitchen she is reluctant to incr insulin, rec to switch to LEVEMIR 40u/d (she was intol). ~  labs 9/11 showed BS= 257, A1c= 9.1.Marland KitchenMarland Kitchen rec slowly incr Lantus by 2u increments until FBS is <150... ~  Labs 10/12 on Lantus40+SSHumalog showed BS= 149, A1c= 9.4 ~  Labs 3/13 showed BS= 83, A1c= 9.4  HYPOTHYROIDISM (ICD-244.9) - on SYNTHROID 25mcg/d...   ~  labs 3/08 showed TSH = 2.0 ~  labs 3/09 showed TSH = 2.93 ~  labs 5/10 showed TSH = 3.46 ~  labs 3/11 showed TSH= 3.15 ~   Labs 4/12 showed TSH= 3.70 ~  Labs 3/13 showed TSH= 5.69... Reminded to take Qam on empty stomach...  HIATAL HERNIA (ICD-553.3) - she is off her Protonix and taking PRILOSEC 20mg /d... ~  EGD 12/03 showed 5cmHH, tortuous & spastic esop, chr atrophic gastritis... Nexium recommended. ~  colonoscopy 12/03 was WNL x for decr rectal tone noted... incr fiber recommended.  DEGENERATIVE JOINT DISEASE (ICD-715.90) - "my back's in bad shape" per DrOlin & Shon Baton- nothing can be done surgically to help her... ~  5/09: fall w/ left hip fx- Rx DrOlin w/ closed reduction & percut screw fixation... she's been told she needs THR. ~  Back eval DrRamos 12/09 w/ epid steroid shot 1/10 without much benefit... ~  6/11:  she reports repeat epid shots by DrRamos helped somewhat & she will f/u w/ him... ~  9/11:  c/o hurting all over & TRAMADOL 50mg  Qid Prn tried...  OSTEOPOROSIS (ICD-733.00), & VITAMIN D DEFICIENCY (ICD-268.9) - followed by GYN= DrTomblin w/ BMD 12/09 per pt ?results... not on bisphos Rx- she takes Calcium, Vits, & had Vit D defic w/ level= 8 per pt hx treated transiently w/ 50K weekly- now on 1000 u daily & level pending at DrTomblin's office.  ANEMIA OF CHRONIC DISEASE (ICD-285.29) ~  labs 9/08 showed Hg= 11.3  ~  labs 3/09 showed Hg= 11.2 ~  labs 5/10 showed Hg= 11.4 ~  labs 3/11 showed Hg= 11.6 ~  Labs 6/12 showed Hg= 9.6 ~  Labs 3/13 showed Hg= 9.4, Fe=30 (8%)   Past Surgical History  Procedure Date  . Abdominal aortogram 07/27/2003    by Dr. Chales Abrahams  . Bilateral lower extremity angiogram 07/27/2003    by Dr. Chales Abrahams  . Selective angiography,right superficial femoral artery, right and left iliac arteries 07/27/2003    by Dr. Chales Abrahams  . Measurement of gradient right and left iliac arteries 07/27/2003    by Dr. Chales Abrahams  . Bilateral cataract sugery 2000 and 2002    by Dr. Cecilie Kicks  . Repair left hip fracture 04/2008    by Dr. Charlann Boxer    Outpatient Encounter Prescriptions as of 02/29/2012  Medication  Sig Dispense Refill  . acetaminophen (TYLENOL) 500 MG tablet Take 500 mg by mouth every 6 (six) hours as needed.        Marland Kitchen aspirin 81 MG tablet Take 81 mg by mouth daily.        . carvedilol (COREG) 6.25 MG tablet Take 1 tablet (6.25 mg total) by mouth 2 (two) times daily with a meal.  60 tablet  11  . Cholecalciferol (VITAMIN D) 1000 UNITS capsule Take 1,000 Units by mouth daily.        . felodipine (PLENDIL) 10 MG 24 hr tablet Take 1 tablet (10 mg total) by mouth daily.  90 tablet  3  . furosemide (LASIX) 20 MG tablet Take 20 mg by mouth 2 (two) times daily.      Marland Kitchen gabapentin (NEURONTIN) 100 MG capsule Take 1 capsule (100 mg total) by mouth 3 (three) times daily.  90 capsule  3  . insulin glargine (LANTUS) 100 UNIT/ML injection Inject 20 Units into the skin at bedtime.      . insulin lispro (HUMALOG) 100 UNIT/ML injection Inject 5 to 15 units subcutaneously as needed before meals  10 mL  4  . multivitamin (THERAGRAN) per tablet Take 1 tablet by mouth daily.        . nitroGLYCERIN (NITROSTAT)  0.4 MG SL tablet Place 0.4 mg under the tongue every 5 (five) minutes as needed. May repeat up to 3 doses       . pantoprazole (PROTONIX) 40 MG tablet Take 1 tablet by mouth Daily.      . pravastatin (PRAVACHOL) 40 MG tablet 1 1/2 tab po qd      . quinapril (ACCUPRIL) 40 MG tablet Take 1 tablet (40 mg total) by mouth daily.  90 tablet  3  . SYNTHROID 75 MCG tablet Take 1 tablet by mouth Daily.      . traMADol (ULTRAM) 50 MG tablet Take 50 mg by mouth every 6 (six) hours as needed.      Marland Kitchen DISCONTD: insulin glargine (LANTUS) 100 UNIT/ML injection Inject 40 Units into the skin at bedtime.  40 mL  3  . methocarbamol (ROBAXIN) 500 MG tablet Take 1 tablet (500 mg total) by mouth 3 (three) times daily as needed. For muscle spasms as needed  90 tablet  5    Allergies  Allergen Reactions  . Ibuprofen     REACTION: edema---thyroid problems  . Prednisone     Causes severe fluid retention    Current  Medications, Allergies, Past Medical History, Past Surgical History, Family History, and Social History were reviewed in [employer] record.   Review of Systems   Constitutional:  Denies F/C/S, anorexia, unexpected weight change. HEENT:  Occas HAs; but no visual changes, earache, nasal symptoms, sore throat, hoarseness. Resp:  No cough, sputum, hemoptysis; no ch DOE, & no tightness or wheezing. Cardio:  No CP, palpit, orthopnea, edema. GI:  Denies N/V/D/C or blood in stool; no reflux, abd pain, distention, or gas. GU:  No dysuria, freq, urgency, hematuria, or flank pain. MS:  Mod joint pains, w/ mild swelling, tenderness, & decr ROM... Neuro:  No tremors, seizures, dizziness, syncope; sl weakness & multifactorial gait abn. Skin:  No suspicious lesions or skin rash. Heme:  No adenopathy, bruising, bleeding. Psyche: Denies confusion, sleep disturbance, hallucinations; mod anxiety caring for husb (Alz).   Objective:   Physical Exam   WD, WN, 77 y/o WF in NAD; she is chr ill appearing... Vital Signs:  Reviewed...BP= 126/60 General:  Alert & oriented; pleasant & cooperative... HEENT:  Graves/AT, EOM-wnl, PERRLA, EACs-clear, TMs-wnl, NOSE-clear, THROAT-clear & wnl. Neck:  Sl tender w/ decr ROM; no JVD; normal carotid impulses w/o bruits; no thyromegaly or nodules palpated; no lymphadenopathy. Chest:  Clear to P & A; without wheezes/ rales/ or rhonchi heard... Heart:  Regular Rhythm; norm S1 & S2, & gr 1/6 SEM w/o rubs or gallops detected... Abdomen:  Soft & nontender; normal bowel sounds; no organomegaly or masses palpated... Ext:  Decr ROM; +heberden's etc & mod arthritic changes; no varicose veins, +venous insuffic, tr edema;  Pulses intact w/o bruits... Neuro:  CNs II-XII intact; motor testing normal; sensory testing normal; abn gait & balance only fair... Derm:  No lesions noted; no rash etc... Lymph:  No cervical, supraclavicular, axillary, or inguinal adenopathy  palpated...   RADIOLOGY DATA:  Reviewed in the EPIC EMR & discussed w/ the patient...  LABORATORY DATA:  Reviewed in the EPIC EMR & discussed w/ the patient...   Assessment & Plan:   DM> as above, on Lantus/ Humalog w/ BS up & down at home ==> FBS=83 & A1c=9.4; we reviewed diet/ exercise/ insulin adjustment based on her accuchecks.   Retinopathy> followed at [facility] (we don't have notes); she is blind in the right eye.Marland KitchenMarland Kitchen  HBP> controlled on Coreg6.25Bid, Plendil10, Quinapril40, Lasix20; BP= 142/60 & even better at home she says...      CAD> She had extensive Cards eval by DrStuckey & DrWall; Caths 4/12 & 6/12 showed worrisome lesion in proxCFX- they decided on medical therapy (see below, & DrWalls notes are reviewed); she denies angina, palpit, ch in chr DOE, edema, etc...     ASPVD> CDopplers 7/12 stable, no cerebral ischemic symptoms; stable PAD w/o claud & known sm vessel dis distally, no LE lesions etc...     CHOL> on Prav40 now & diet ==> not fasting today for FLP...        Hypothy> stable on Levothy75...     GI> back on Protonix 40mg /d...     DJD>  Still having lots of back pain & followed by Mayford Knife; on Tramadol prn...     Anemia> she is off her prev iron therapy but still taking lots of Vits etc; rec to restart FeSO4, Vit C & Vit B12 1061mcg/d...   Patient's Medications  New Prescriptions   No medications on file  Previous Medications   ACETAMINOPHEN (TYLENOL) 500 MG TABLET    Take 500 mg by mouth every 6 (six) hours as needed.     ASPIRIN 81 MG TABLET    Take 81 mg by mouth daily.     CARVEDILOL (COREG) 6.25 MG TABLET    Take 1 tablet (6.25 mg total) by mouth 2 (two) times daily with a meal.   CHOLECALCIFEROL (VITAMIN D) 1000 UNITS CAPSULE    Take 1,000 Units by mouth daily.     FELODIPINE (PLENDIL) 10 MG 24 HR TABLET    Take 1 tablet (10 mg total) by mouth daily.   FUROSEMIDE (LASIX) 20 MG TABLET    Take 20 mg by mouth 2 (two) times daily.   GABAPENTIN  (NEURONTIN) 100 MG CAPSULE    Take 1 capsule (100 mg total) by mouth 3 (three) times daily.   INSULIN LISPRO (HUMALOG) 100 UNIT/ML INJECTION    Inject 5 to 15 units subcutaneously as needed before meals   METHOCARBAMOL (ROBAXIN) 500 MG TABLET    Take 1 tablet (500 mg total) by mouth 3 (three) times daily as needed. For muscle spasms as needed   MULTIVITAMIN (THERAGRAN) PER TABLET    Take 1 tablet by mouth daily.     NITROGLYCERIN (NITROSTAT) 0.4 MG SL TABLET    Place 0.4 mg under the tongue every 5 (five) minutes as needed. May repeat up to 3 doses    PRAVASTATIN (PRAVACHOL) 40 MG TABLET    1 1/2 tab po qd   QUINAPRIL (ACCUPRIL) 40 MG TABLET    Take 1 tablet (40 mg total) by mouth daily.   TRAMADOL (ULTRAM) 50 MG TABLET    Take 50 mg by mouth every 6 (six) hours as needed.  Modified Medications   Modified Medication Previous Medication   INSULIN GLARGINE (LANTUS) 100 UNIT/ML INJECTION insulin glargine (LANTUS) 100 UNIT/ML injection      Inject 20 Units into the skin at bedtime.    Inject 40 Units into the skin at bedtime.   LEVOTHYROXINE (SYNTHROID) 75 MCG TABLET SYNTHROID 75 MCG tablet      Take 1 tablet (75 mcg total) by mouth daily.    Take 1 tablet by mouth Daily.   PANTOPRAZOLE (PROTONIX) 40 MG TABLET pantoprazole (PROTONIX) 40 MG tablet      Take 1 tablet (40 mg total) by mouth 2 (two) times daily. 30 minutes  before breakfast and dinner    Take 1 tablet by mouth 2 (two) times daily.   Discontinued Medications   No medications on file

## 2012-03-02 LAB — PROTEIN ELECTROPHORESIS, SERUM, WITH REFLEX
Albumin ELP: 55.2 % — ABNORMAL LOW (ref 55.8–66.1)
Alpha-1-Globulin: 6 % — ABNORMAL HIGH (ref 2.9–4.9)
Alpha-2-Globulin: 16.2 % — ABNORMAL HIGH (ref 7.1–11.8)
Total Protein, Serum Electrophoresis: 6 g/dL (ref 6.0–8.3)

## 2012-03-03 LAB — IGG, IGA, IGM
IgA: 211 mg/dL (ref 69–380)
IgG (Immunoglobin G), Serum: 684 mg/dL — ABNORMAL LOW (ref 690–1700)

## 2012-03-04 LAB — IFE INTERPRETATION

## 2012-03-09 ENCOUNTER — Telehealth: Payer: Self-pay | Admitting: Pulmonary Disease

## 2012-03-09 ENCOUNTER — Encounter: Payer: Self-pay | Admitting: Pulmonary Disease

## 2012-03-09 NOTE — Telephone Encounter (Signed)
I spoke with pt and she is requesting her lab results from 02/29/12. Please advise Dr. Kriste Basque thanks

## 2012-03-10 NOTE — Telephone Encounter (Signed)
Called and lmom for pt to call me back about her lab results.

## 2012-03-10 NOTE — Telephone Encounter (Signed)
Pt returned my call. Reviewed her labs with her per SN.  Pt is aware to start on the feosol 1 daily along with vit c 500mg  daily.  b12 daily.  She will cont her DM meds the same.  Pt voiced her understanding of her labs and will call back for any other concerns.

## 2012-03-23 ENCOUNTER — Telehealth: Payer: Self-pay | Admitting: Pulmonary Disease

## 2012-03-23 NOTE — Telephone Encounter (Signed)
Per SN--no she doesn't qualify for b12 shots(numbers are not low enough).  She will need to take the po meds as directed and give it some time.  Needs to take  Iron daily, b12 daily, womens MVI daily, exercise/walk a little bit each day.  Called and spoke with pt with SN recs and she is aware.

## 2012-03-23 NOTE — Telephone Encounter (Signed)
Called spoke with patient who stated that she began taking b12 and iron on 4.4.13 after speaking with Leigh about her lab results.  Pt stated that she does not feel any different since beginning the medications and she still has no energy, her legs hurts and she is still short of breath.  Pt is requesting to know if (1) she can get b12 shots and (2) if she can administer them at home.  Dr Kriste Basque please advise, thanks.

## 2012-04-05 ENCOUNTER — Other Ambulatory Visit: Payer: Self-pay | Admitting: Cardiology

## 2012-04-05 ENCOUNTER — Telehealth: Payer: Self-pay | Admitting: Cardiology

## 2012-04-05 ENCOUNTER — Telehealth: Payer: Self-pay | Admitting: Pulmonary Disease

## 2012-04-05 DIAGNOSIS — E039 Hypothyroidism, unspecified: Secondary | ICD-10-CM

## 2012-04-05 NOTE — Telephone Encounter (Signed)
Per SN---she was solid stable on this dose   daily but last tsh was borderline.  SN recs that she take the every day, 1 st thing in the morning on an empty stomach to ensure max adsorption and reschedule TSH in 4-6 weeks and we will call her with the results.  thanks

## 2012-04-05 NOTE — Telephone Encounter (Signed)
Called, spoke with pt.  She is aware of below per Dr. Kriste Basque.  She verbalized understanding of this and will come back in 4-6 wks for TSH.  Order has been placed and pt aware.

## 2012-04-05 NOTE — Telephone Encounter (Signed)
Pt continues to c/o cough/sob/lethargy/cant lay down at night without sob waking her. Denies ankle edema but has increased abd bloat. Pt has fears that carvedilol or lasix is either not working or has made her worse, she admits to skipping a dose or two, reviewed meds and pt will take as prescribed for today. She states her weight has remained the same 145-146 lb bp 152/70 p 73. She just has felt so bad since January. Dr Daleen Squibb schedule full, app with Lawson Fiscal gerhardt np, pt accepting of plan.

## 2012-04-05 NOTE — Telephone Encounter (Signed)
New msg Pt is having some side effects from her BP med. Please call her back

## 2012-04-05 NOTE — Telephone Encounter (Signed)
I spoke with pt and she states that Dr. Daleen Squibb had advised her to call SN regarding her thyroid level bc it was elevated compared to what it has been in the past. I advised her she had already been called with her lab report and she states she still wants SN to re look at her thryoid level again to make sure her medication does not need to be readjusted. Please advise SN thanks

## 2012-04-06 ENCOUNTER — Ambulatory Visit: Payer: Medicare Other | Admitting: Nurse Practitioner

## 2012-04-06 ENCOUNTER — Ambulatory Visit (INDEPENDENT_AMBULATORY_CARE_PROVIDER_SITE_OTHER): Payer: Medicare Other | Admitting: Nurse Practitioner

## 2012-04-06 ENCOUNTER — Encounter: Payer: Self-pay | Admitting: Nurse Practitioner

## 2012-04-06 DIAGNOSIS — R0989 Other specified symptoms and signs involving the circulatory and respiratory systems: Secondary | ICD-10-CM

## 2012-04-06 DIAGNOSIS — I10 Essential (primary) hypertension: Secondary | ICD-10-CM

## 2012-04-06 DIAGNOSIS — I5020 Unspecified systolic (congestive) heart failure: Secondary | ICD-10-CM

## 2012-04-06 DIAGNOSIS — I5032 Chronic diastolic (congestive) heart failure: Secondary | ICD-10-CM | POA: Insufficient documentation

## 2012-04-06 DIAGNOSIS — I2510 Atherosclerotic heart disease of native coronary artery without angina pectoris: Secondary | ICD-10-CM

## 2012-04-06 DIAGNOSIS — R0600 Dyspnea, unspecified: Secondary | ICD-10-CM

## 2012-04-06 DIAGNOSIS — R0609 Other forms of dyspnea: Secondary | ICD-10-CM

## 2012-04-06 DIAGNOSIS — I5030 Unspecified diastolic (congestive) heart failure: Secondary | ICD-10-CM

## 2012-04-06 NOTE — Assessment & Plan Note (Signed)
She has known CAD and has been managed medically. Last cath was almost one year ago. Unfortunately, her A1C being so high does not help her disposition. We will try to manage her heart failure symptoms. May need to consider stress testing or repeat cath as we undergo this evaluation. Patient is agreeable to this plan and will call if any problems develop in the interim.

## 2012-04-06 NOTE — Progress Notes (Signed)
Nicole [name] Date of Birth: March 21, 1935 Medical Record #[id number]  History of Present Illness: Nicole [name] is seen today for a work in visit. She is seen for Dr. Daleen Squibb. She is a 77 year old female with multiple medical problems which includes known CAD, IDDM, HTN, CRI, anemia and diastolic heart failure. She had a normal EF one year ago.  She comes in today. She is here alone. She says she has not felt well since January. She was here for a visit in March. She says she has been short of breath, has no energy, and increased swelling. She does report some PND. She has a cough. Belly feels bloated. She remains anemic. Her joints hurt. She may have some occasional chest tightness but she is more bothered by her dyspnea. She is eating too much salt. She is eating pork rinds. Sounds like she gets a lot of fast food. Blood pressure is up. She thought her Coreg might be causing her symptoms and she has missed a few doses. She remains anemic. Has never seen hematology and never tried Aranesp. Does have chronic renal insufficiency. Her A1C is over 9.   Current Outpatient Prescriptions on File Prior to Visit  Medication Sig Dispense Refill  . acetaminophen (TYLENOL) 500 MG tablet Take 500 mg by mouth every 6 (six) hours as needed.        Marland Kitchen aspirin 81 MG tablet Take 81 mg by mouth daily.        . carvedilol (COREG) 6.25 MG tablet TAKE ONE TABLET BY MOUTH TWICE DAILY WITH MEALS  60 tablet  6  . Cholecalciferol (VITAMIN D) 1000 UNITS capsule Take 1,000 Units by mouth daily.        . felodipine (PLENDIL) 10 MG 24 hr tablet Take 1 tablet (10 mg total) by mouth daily.  90 tablet  3  . ferrous sulfate 325 (65 FE) MG tablet Take 325 mg by mouth daily with breakfast.      . furosemide (LASIX) 20 MG tablet Take 20 mg by mouth 2 (two) times daily.      . insulin glargine (LANTUS) 100 UNIT/ML injection Inject 20 Units into the skin at bedtime.      . insulin lispro (HUMALOG) 100 UNIT/ML injection Inject 5 to 15  units subcutaneously as needed before meals  10 mL  4  . levothyroxine (SYNTHROID) 75 MCG tablet Take 1 tablet (75 mcg total) by mouth daily.  90 tablet  3  . multivitamin (THERAGRAN) per tablet Take 1 tablet by mouth daily.        . nitroGLYCERIN (NITROSTAT) 0.4 MG SL tablet Place 0.4 mg under the tongue every 5 (five) minutes as needed. May repeat up to 3 doses       . pantoprazole (PROTONIX) 40 MG tablet Take 1 tablet (40 mg total) by mouth 2 (two) times daily. 30 minutes before breakfast and dinner  180 tablet  3  . quinapril (ACCUPRIL) 40 MG tablet Take 1 tablet (40 mg total) by mouth daily.  90 tablet  3  . traMADol (ULTRAM) 50 MG tablet Take 50 mg by mouth every 6 (six) hours as needed.      . vitamin B-12 (CYANOCOBALAMIN) 1000 MCG tablet Take 1,000 mcg by mouth daily.      . vitamin C (ASCORBIC ACID) 500 MG tablet Take 500 mg by mouth daily. To take with iron tablet daily       . DISCONTD: gabapentin (NEURONTIN) 100 MG capsule Take 1  capsule (100 mg total) by mouth 3 (three) times daily.  90 capsule  3  . pravastatin (PRAVACHOL) 80 MG tablet Take 40 mg by mouth daily.      Marland Kitchen DISCONTD: pravastatin (PRAVACHOL) 40 MG tablet Take 20 mg by mouth daily.         Allergies  Allergen Reactions  . Ibuprofen     REACTION: edema---thyroid problems  . Prednisone     Causes severe fluid retention    Past Medical History  Diagnosis Date  . Retinopathy   . Hypertension   . CHF (congestive heart failure)     has a normal EF per echo 03/2011 with diastolic dysfunction  . CAD (coronary artery disease)     Prior PCI in 1998 x 2; s/p cath in 2001, negative Myoview in 2008 & 2011, s/p cath in June 2012 with calcified disease of the proximal LCX with normal flow reserve. She has been managed medically due to her other morbidities  . Cerebrovascular disease   . Peripheral vascular disease     prior angiography showing RSFA stenosis, left anterior tibial and left posterior tibial stenoses  .  Hypercholesterolemia   . Diabetes mellitus     insulin dependent  . Hypothyroidism   . Hiatal hernia   . Degenerative joint disease   . Osteoporosis   . Vitamin d deficiency   . Anemia of chronic disease   . Renal insufficiency     Past Surgical History  Procedure Date  . Abdominal aortogram 07/27/2003    by Dr. Chales Abrahams  . Bilateral lower extremity angiogram 07/27/2003    by Dr. Chales Abrahams  . Selective angiography,right superficial femoral artery, right and left iliac arteries 07/27/2003    by Dr. Chales Abrahams  . Measurement of gradient right and left iliac arteries 07/27/2003    by Dr. Chales Abrahams  . Bilateral cataract sugery 2000 and 2002    by Dr. Cecilie Kicks  . Repair left hip fracture 04/2008    by Dr. Charlann Boxer    History  Smoking status  . Former Smoker  . Types: Cigarettes  . Quit date: 12/07/1974  Smokeless tobacco  . Never Used  Comment: pt started smoking in 1956    History  Alcohol Use No    History reviewed. No pertinent family history.  Review of Systems: The review of systems is per the HPI.  All other systems were reviewed and are negative.  Physical Exam: BP 170/58  Pulse 64  Ht 5\' 4"  (1.626 m)  Wt 146 lb (66.225 kg)  BMI 25.06 kg/m2  SpO2 95% Patient is very pleasant and in no acute distress. Skin is warm and dry. Color is somewhat sallow and pasty-looking.  HEENT is unremarkable. Normocephalic/atraumatic. PERRL. Sclera are nonicteric. Neck is supple. No masses. No JVD. Her neck veins are flat. Lungs are clear. Cardiac exam shows a regular rate and rhythm. No S3. Noted. Abdomen is somewhat full but fairly soft. Extremities are show 2+ edema that is primarily in her upper thighs. Some sacral edema noted.  Gait and ROM are intact. She is using a cane. No gross neurologic deficits noted.   LABORATORY DATA:  EKG today shows sinus rhythm with nonspecific ST/T wave changes. Tracing was reviewed with Dr. Daleen Squibb.  Lab Results  Component Value Date   WBC 6.0 02/29/2012   HGB 9.4*  02/29/2012   HCT 28.5* 02/29/2012   PLT 239.0 02/29/2012   GLUCOSE 83 02/29/2012   CHOL 186 09/23/2011   TRIG 125.0 09/23/2011  HDL 51.20 09/23/2011   LDLDIRECT 258.4 12/21/2008   LDLCALC 110* 09/23/2011   ALT 19 02/29/2012   AST 24 02/29/2012   NA 140 02/29/2012   K 4.5 02/29/2012   CL 106 02/29/2012   CREATININE 1.3* 02/29/2012   BUN 31* 02/29/2012   CO2 24 02/29/2012   TSH 5.69* 02/29/2012   INR 1.0 04/29/2011   HGBA1C 9.4* 02/29/2012    Lab Results  Component Value Date   IRON 30* 02/29/2012   TIBC 309 03/20/2011   FERRITIN 28 03/20/2011   Lab Results  Component Value Date   VITAMINB12 215 02/29/2012     Assessment / Plan:

## 2012-04-06 NOTE — Assessment & Plan Note (Signed)
She has missed a few doses of Coreg. She thought this was what was making her feel bad. I have asked her to continue the Coreg. Will increase her diuretics. Hopefully her blood pressure will come down. May need to add Hydralazine on return.

## 2012-04-06 NOTE — Assessment & Plan Note (Signed)
Patient presents with progressive swelling and shortness of breath. I have discussed her care with Dr. Daleen Squibb. We are going to repeat her echo. We are increasing her lasix. We will recheck her labs today as well. May need to give consideration for Aranesp in the future. She has to cut back the salt and this was emphasized in great detail to her today. I will see her back in one week. Patient is agreeable to this plan and will call if any problems develop in the interim.

## 2012-04-06 NOTE — Patient Instructions (Addendum)
I want you to increase your Lasix to 60 mg in the am and 40 mg in the pm for 4 days then decrease to just 40 mg two times a day til I see you back  Minimize your salt to less than 2000 mg a day  Weigh each day  We are going to recheck your labs today  You need to have another ultrasound of your heart. We will try to get that done in the next week.  I will see you in a week  Call the [facility] office at [phone] if you have any questions, problems or concerns.

## 2012-04-07 LAB — BASIC METABOLIC PANEL
BUN: 22 mg/dL (ref 6–23)
CO2: 27 mEq/L (ref 19–32)
Calcium: 8.8 mg/dL (ref 8.4–10.5)
Chloride: 104 mEq/L (ref 96–112)
Creatinine, Ser: 1.4 mg/dL — ABNORMAL HIGH (ref 0.4–1.2)
GFR: 39.61 mL/min — ABNORMAL LOW (ref >60.00)
Glucose, Bld: 216 mg/dL — ABNORMAL HIGH (ref 70–99)
Potassium: 4.5 mEq/L (ref 3.5–5.1)
Sodium: 138 mEq/L (ref 135–145)

## 2012-04-07 LAB — CBC WITH DIFFERENTIAL/PLATELET
Basophils Absolute: 0 10*3/uL (ref 0.0–0.1)
Basophils Relative: 0.3 % (ref 0.0–3.0)
Eosinophils Absolute: 0.2 10*3/uL (ref 0.0–0.7)
Eosinophils Relative: 3.4 % (ref 0.0–5.0)
HCT: 31.3 % — ABNORMAL LOW (ref 36.0–46.0)
Hemoglobin: 10.1 g/dL — ABNORMAL LOW (ref 12.0–15.0)
Lymphocytes Relative: 20.7 % (ref 12.0–46.0)
Lymphs Abs: 1.3 10*3/uL (ref 0.7–4.0)
MCHC: 32.4 g/dL (ref 30.0–36.0)
MCV: 89.4 fl (ref 78.0–100.0)
Monocytes Absolute: 0.6 10*3/uL (ref 0.1–1.0)
Monocytes Relative: 10.1 % (ref 3.0–12.0)
Neutro Abs: 4.2 10*3/uL (ref 1.4–7.7)
Neutrophils Relative %: 65.5 % (ref 43.0–77.0)
Platelets: 222 10*3/uL (ref 150.0–400.0)
RBC: 3.49 Mil/uL — ABNORMAL LOW (ref 3.87–5.11)
RDW: 16 % — ABNORMAL HIGH (ref 11.5–14.6)
WBC: 6.4 10*3/uL (ref 4.5–10.5)

## 2012-04-07 LAB — BRAIN NATRIURETIC PEPTIDE: Pro B Natriuretic peptide (BNP): 251 pg/mL — ABNORMAL HIGH (ref 0.0–100.0)

## 2012-04-11 ENCOUNTER — Ambulatory Visit (HOSPITAL_COMMUNITY): Payer: Medicare Other | Attending: Nurse Practitioner

## 2012-04-11 ENCOUNTER — Other Ambulatory Visit: Payer: Self-pay

## 2012-04-11 DIAGNOSIS — R0602 Shortness of breath: Secondary | ICD-10-CM

## 2012-04-11 DIAGNOSIS — I079 Rheumatic tricuspid valve disease, unspecified: Secondary | ICD-10-CM | POA: Insufficient documentation

## 2012-04-11 DIAGNOSIS — I2510 Atherosclerotic heart disease of native coronary artery without angina pectoris: Secondary | ICD-10-CM | POA: Insufficient documentation

## 2012-04-11 DIAGNOSIS — Z8673 Personal history of transient ischemic attack (TIA), and cerebral infarction without residual deficits: Secondary | ICD-10-CM | POA: Insufficient documentation

## 2012-04-11 DIAGNOSIS — R0600 Dyspnea, unspecified: Secondary | ICD-10-CM

## 2012-04-11 DIAGNOSIS — I10 Essential (primary) hypertension: Secondary | ICD-10-CM | POA: Insufficient documentation

## 2012-04-11 DIAGNOSIS — I059 Rheumatic mitral valve disease, unspecified: Secondary | ICD-10-CM | POA: Insufficient documentation

## 2012-04-13 ENCOUNTER — Ambulatory Visit (INDEPENDENT_AMBULATORY_CARE_PROVIDER_SITE_OTHER): Payer: Medicare Other | Admitting: Nurse Practitioner

## 2012-04-13 ENCOUNTER — Encounter: Payer: Self-pay | Admitting: Nurse Practitioner

## 2012-04-13 VITALS — BP 130/40 | HR 68 | Ht 64.0 in | Wt 135.0 lb

## 2012-04-13 DIAGNOSIS — R609 Edema, unspecified: Secondary | ICD-10-CM

## 2012-04-13 DIAGNOSIS — I10 Essential (primary) hypertension: Secondary | ICD-10-CM

## 2012-04-13 DIAGNOSIS — I5030 Unspecified diastolic (congestive) heart failure: Secondary | ICD-10-CM

## 2012-04-13 DIAGNOSIS — I2510 Atherosclerotic heart disease of native coronary artery without angina pectoris: Secondary | ICD-10-CM

## 2012-04-13 DIAGNOSIS — D638 Anemia in other chronic diseases classified elsewhere: Secondary | ICD-10-CM

## 2012-04-13 LAB — BASIC METABOLIC PANEL
BUN: 30 mg/dL — ABNORMAL HIGH (ref 6–23)
CO2: 28 mEq/L (ref 19–32)
Calcium: 8.6 mg/dL (ref 8.4–10.5)
Chloride: 102 mEq/L (ref 96–112)
Creatinine, Ser: 1.2 mg/dL (ref 0.4–1.2)
GFR: 47.45 mL/min — ABNORMAL LOW (ref >60.00)
Glucose, Bld: 195 mg/dL — ABNORMAL HIGH (ref 70–99)
Potassium: 4.1 mEq/L (ref 3.5–5.1)
Sodium: 138 mEq/L (ref 135–145)

## 2012-04-13 MED ORDER — FUROSEMIDE 40 MG PO TABS: 40.0000 mg | ORAL_TABLET | Freq: Two times a day (BID) | ORAL | Status: DC

## 2012-04-13 NOTE — Patient Instructions (Signed)
Try to check some blood pressures at home.  Stay on the 40 mg of Lasix two times a day. I have sent a new prescription to the drug store.  We will see you later this month  We are checking labs today  Keep minimizing your salt  Call the [facility] office at [phone] if you have any questions, problems or concerns.

## 2012-04-13 NOTE — Assessment & Plan Note (Signed)
Blood pressure has improved with restarting Coreg. May try to increase her dose on return. I have asked her to check some readings at home and bring those in for review at her appointment later this month.

## 2012-04-13 NOTE — Assessment & Plan Note (Signed)
She is back on some iron therapy per Dr. Kriste Basque. I have encouraged her to continue.

## 2012-04-13 NOTE — Assessment & Plan Note (Signed)
No chest pain reported. Will continue with medical management. 

## 2012-04-13 NOTE — Progress Notes (Signed)
Nicole [name] Date of Birth: 03-May-1935 Medical Record #[id number]  History of Present Illness: Nicole [name] is seen back today for a one week check. She is seen for Dr. Daleen Squibb. She has multiple medical issues which include known CAD, IDDM, HTN, CRI, anemia and diastolic heart failure. I saw her a week ago with worsening edema and HTN. Lasix was increased. She had not been taking her Coreg - it was restarted. We obtained an echo which shows normal LV function, mild MR.   She comes in today. She is doing much better. Weight is down 11 pounds. Blood pressure is down. She is restricting her salt. No chest pain. Some cramps in her calves. Will need to recheck a BMET today. She can now lie flat. No cough. Overall, she is greatly improved. She has started on some iron. Glucose remains brittle.   Current Outpatient Prescriptions on File Prior to Visit  Medication Sig Dispense Refill  . acetaminophen (TYLENOL) 500 MG tablet Take 500 mg by mouth every 6 (six) hours as needed.        Marland Kitchen aspirin 81 MG tablet Take 81 mg by mouth daily.        . carbonyl iron (FEOSOL) 45 MG TABS Take 45 mg by mouth daily.      . carvedilol (COREG) 6.25 MG tablet TAKE ONE TABLET BY MOUTH TWICE DAILY WITH MEALS  60 tablet  6  . Cholecalciferol (VITAMIN D) 1000 UNITS capsule Take 1,000 Units by mouth daily.        . felodipine (PLENDIL) 10 MG 24 hr tablet Take 1 tablet (10 mg total) by mouth daily.  90 tablet  3  . furosemide (LASIX) 20 MG tablet Take 20 mg by mouth 2 (two) times daily.      Marland Kitchen gabapentin (NEURONTIN) 100 MG capsule Take 100 mg by mouth every other day.       . insulin glargine (LANTUS) 100 UNIT/ML injection Inject 20 Units into the skin at bedtime.      . insulin lispro (HUMALOG) 100 UNIT/ML injection Inject 5 to 15 units subcutaneously as needed before meals  10 mL  4  . levothyroxine (SYNTHROID) 75 MCG tablet Take 1 tablet (75 mcg total) by mouth daily.  90 tablet  3  . multivitamin (THERAGRAN) per tablet  Take 1 tablet by mouth daily.        . nitroGLYCERIN (NITROSTAT) 0.4 MG SL tablet Place 0.4 mg under the tongue every 5 (five) minutes as needed. May repeat up to 3 doses       . pantoprazole (PROTONIX) 40 MG tablet Take 1 tablet (40 mg total) by mouth 2 (two) times daily. 30 minutes before breakfast and dinner  180 tablet  3  . pravastatin (PRAVACHOL) 80 MG tablet Take 80 mg by mouth daily.       . quinapril (ACCUPRIL) 40 MG tablet Take 1 tablet (40 mg total) by mouth daily.  90 tablet  3  . traMADol (ULTRAM) 50 MG tablet Take 50 mg by mouth every 6 (six) hours as needed.      . vitamin B-12 (CYANOCOBALAMIN) 1000 MCG tablet Take 1,000 mcg by mouth daily.      . vitamin C (ASCORBIC ACID) 500 MG tablet Take 500 mg by mouth daily. To take with iron tablet daily       . pravastatin (PRAVACHOL) 80 MG tablet Take 40 mg by mouth daily.        Allergies  Allergen Reactions  .  Ibuprofen     REACTION: edema---thyroid problems  . Prednisone     Causes severe fluid retention    Past Medical History  Diagnosis Date  . Retinopathy   . Hypertension   . CHF (congestive heart failure)     has a normal EF per echo 03/2011 with diastolic dysfunction  . CAD (coronary artery disease)     Prior PCI in 1998 x 2; s/p cath in 2001, negative Myoview in 2008 & 2011, s/p cath in June 2012 with calcified disease of the proximal LCX with normal flow reserve. She has been managed medically due to her other morbidities  . Cerebrovascular disease   . Peripheral vascular disease     prior angiography showing RSFA stenosis, left anterior tibial and left posterior tibial stenoses  . Hypercholesterolemia   . Diabetes mellitus     insulin dependent  . Hypothyroidism   . Hiatal hernia   . Degenerative joint disease   . Osteoporosis   . Vitamin d deficiency   . Anemia of chronic disease   . Renal insufficiency     Past Surgical History  Procedure Date  . Abdominal aortogram 07/27/2003    by Dr. Chales Abrahams  .  Bilateral lower extremity angiogram 07/27/2003    by Dr. Chales Abrahams  . Selective angiography,right superficial femoral artery, right and left iliac arteries 07/27/2003    by Dr. Chales Abrahams  . Measurement of gradient right and left iliac arteries 07/27/2003    by Dr. Chales Abrahams  . Bilateral cataract sugery 2000 and 2002    by Dr. Cecilie Kicks  . Repair left hip fracture 04/2008    by Dr. Charlann Boxer    History  Smoking status  . Former Smoker  . Types: Cigarettes  . Quit date: 12/07/1974  Smokeless tobacco  . Never Used  Comment: pt started smoking in 1956    History  Alcohol Use No    No family history on file.  Review of Systems: The review of systems is positive for cramps.  All other systems were reviewed and are negative.  Physical Exam: BP 130/40  Pulse 68  Ht 5\' 4"  (1.626 m)  Wt 135 lb (61.236 kg)  BMI 23.17 kg/m2 Patient is very pleasant and in no acute distress. Skin is warm and dry. Color is still sallow.  HEENT is unremarkable. Normocephalic/atraumatic. PERRL. Sclera are nonicteric. Neck is supple. No masses. No JVD. Lungs are clear. Cardiac exam shows a regular rate and rhythm. Abdomen is soft. Extremities are with less edema but still probably 1+. Gait and ROM are intact. No gross neurologic deficits noted.  LABORATORY DATA: Repeat BMET is pending.   Echo Study Conclusions (May 2013)  - Left ventricle: The cavity size was normal. Wall thickness was normal. Systolic function was vigorous. The estimated ejection fraction was in the range of 65% to 70%. - Mitral valve: Calcified annulus. Mildly thickened leaflets . Mild regurgitation. - Pulmonary arteries: PA peak pressure: 34mm Hg (S).   Lab Results  Component Value Date   WBC 6.4 04/06/2012   HGB 10.1* 04/06/2012   HCT 31.3* 04/06/2012   PLT 222.0 04/06/2012   GLUCOSE 216* 04/06/2012   CHOL 186 09/23/2011   TRIG 125.0 09/23/2011   HDL 51.20 09/23/2011   LDLDIRECT 258.4 12/21/2008   LDLCALC 110* 09/23/2011   ALT 19 02/29/2012   AST 24  02/29/2012   NA 138 04/06/2012   K 4.5 04/06/2012   CL 104 04/06/2012   CREATININE 1.4* 04/06/2012   BUN  22 04/06/2012   CO2 27 04/06/2012   TSH 5.69* 02/29/2012   INR 1.0 04/29/2011   HGBA1C 9.4* 02/29/2012     Assessment / Plan:

## 2012-04-13 NOTE — Assessment & Plan Note (Addendum)
She is doing much better. Weight is down 11 pounds. Her Lasix was dropped back to 40 mg BID this past Monday. We will keep her on this dose for now. Still has some edema noted on exam but overall she does appear improved. We will recheck a BMET today. She will see Dr. Daleen Squibb later this month. I have encouraged her to minimize her salt and keep weighing daily.  Patient is agreeable to this plan and will call if any problems develop in the interim.

## 2012-04-18 ENCOUNTER — Telehealth: Payer: Self-pay | Admitting: Pulmonary Disease

## 2012-04-18 NOTE — Telephone Encounter (Signed)
I spoke with pt and she states she thinks a company called [employer] was going to fax over a form for her to start getting ehr test strips through them. I spoke with leigh and she has no seen anything come through on pt. I advised pt of this and she states she is going to call them to have them refax this over tomorrow. Will forward to leigh to look out for fax

## 2012-04-19 NOTE — Telephone Encounter (Signed)
[email or web address]  

## 2012-04-19 NOTE — Telephone Encounter (Signed)
Have received papers and will have SN sign and get them faxed back in .  thanks

## 2012-04-19 NOTE — Telephone Encounter (Signed)
Form has been signed and faxed back and placed in scan folder.

## 2012-04-19 NOTE — Telephone Encounter (Signed)
Pt returned call, she verbalized her understanding about the form.  Pt asked that we be sure that the form authorizes for her to check her BS 6x daily.  Asked pt why she check her BS so often and she stated that "sometimes it drops so she has to check it."  Advised pt that insurance typically does not cover for BS to be tested more than once daily if not in insulin.  Pt stated that thru [facility] she did not have an issue with this.  Advised pt that that may be so, but she should be prepared in case insurance will not continue to cover this many strips per month esp with switching to a new company.  Pt verbalized her understanding.  Pt stated that she does not need a call back once the form is faxed.  Will forward to Leigh/SN as FYI.

## 2012-04-29 ENCOUNTER — Encounter: Payer: Self-pay | Admitting: Cardiology

## 2012-04-29 ENCOUNTER — Ambulatory Visit (INDEPENDENT_AMBULATORY_CARE_PROVIDER_SITE_OTHER): Payer: Medicare Other | Admitting: Cardiology

## 2012-04-29 VITALS — BP 158/50 | HR 56 | Ht 64.0 in | Wt 130.0 lb

## 2012-04-29 DIAGNOSIS — I509 Heart failure, unspecified: Secondary | ICD-10-CM

## 2012-04-29 DIAGNOSIS — I5032 Chronic diastolic (congestive) heart failure: Secondary | ICD-10-CM

## 2012-04-29 NOTE — Progress Notes (Signed)
HPI Nicole [name] comes in for close followup of her fluid retention and diastolic heart failure. Her weight is dropped 13 pounds she now has a home weight of about 132.  She denies orthopnea, PND, abdominal distention or edema.    Past Medical History  Diagnosis Date  . Retinopathy   . Hypertension   . CHF (congestive heart failure)     has a normal EF per echo 03/2011 & 5/2013with diastolic dysfunction  . CAD (coronary artery disease)     Prior PCI in 1998 x 2; s/p cath in 2001, negative Myoview in 2008 & 2011, s/p cath in June 2012 with calcified disease of the proximal LCX with normal flow reserve. She has been managed medically due to her other morbidities  . Cerebrovascular disease   . Peripheral vascular disease     prior angiography showing RSFA stenosis, left anterior tibial and left posterior tibial stenoses  . Hypercholesterolemia   . Diabetes mellitus     insulin dependent  . Hypothyroidism   . Hiatal hernia   . Degenerative joint disease   . Osteoporosis   . Vitamin d deficiency   . Anemia of chronic disease   . Renal insufficiency     Current Outpatient Prescriptions  Medication Sig Dispense Refill  . acetaminophen (TYLENOL) 500 MG tablet Take 500 mg by mouth every 6 (six) hours as needed.        Marland Kitchen aspirin 81 MG tablet Take 81 mg by mouth daily.        . carbonyl iron (FEOSOL) 45 MG TABS Take 45 mg by mouth daily.      . carvedilol (COREG) 6.25 MG tablet TAKE ONE TABLET BY MOUTH TWICE DAILY WITH MEALS  60 tablet  6  . Cholecalciferol (VITAMIN D) 1000 UNITS capsule Take 1,000 Units by mouth daily.        . felodipine (PLENDIL) 10 MG 24 hr tablet Take 1 tablet (10 mg total) by mouth daily.  90 tablet  3  . furosemide (LASIX) 40 MG tablet Take 1 tablet (40 mg total) by mouth 2 (two) times daily.  180 tablet  3  . gabapentin (NEURONTIN) 100 MG capsule Take 100 mg by mouth every other day.       . insulin glargine (LANTUS) 100 UNIT/ML injection Inject 25 Units into the  skin at bedtime.       . insulin lispro (HUMALOG) 100 UNIT/ML injection Inject 5 to 15 units subcutaneously as needed before meals  10 mL  4  . levothyroxine (SYNTHROID) 75 MCG tablet Take 1 tablet (75 mcg total) by mouth daily.  90 tablet  3  . multivitamin (THERAGRAN) per tablet Take 1 tablet by mouth daily.        . nitroGLYCERIN (NITROSTAT) 0.4 MG SL tablet Place 0.4 mg under the tongue every 5 (five) minutes as needed. May repeat up to 3 doses       . pantoprazole (PROTONIX) 40 MG tablet Take 1 tablet (40 mg total) by mouth 2 (two) times daily. 30 minutes before breakfast and dinner  180 tablet  3  . pravastatin (PRAVACHOL) 80 MG tablet Take 80 mg by mouth daily.       . quinapril (ACCUPRIL) 40 MG tablet Take 1 tablet (40 mg total) by mouth daily.  90 tablet  3  . traMADol (ULTRAM) 50 MG tablet Take 50 mg by mouth every 6 (six) hours as needed.      . vitamin B-12 (CYANOCOBALAMIN) 1000 MCG  tablet Take 1,000 mcg by mouth daily.      . vitamin C (ASCORBIC ACID) 500 MG tablet Take 500 mg by mouth daily. To take with iron tablet daily       . DISCONTD: pravastatin (PRAVACHOL) 80 MG tablet Take 40 mg by mouth daily.        Allergies  Allergen Reactions  . Ibuprofen     REACTION: edema---thyroid problems  . Prednisone     Causes severe fluid retention    No family history on file.  History   Social History  . Marital Status: Married    Spouse Name: Llana Aliment. Wesch    Number of Children: 2  . Years of Education: N/A   Occupational History  . retired    Social History Main Topics  . Smoking status: Former Smoker    Types: Cigarettes    Quit date: 12/07/1974  . Smokeless tobacco: Never Used   Comment: pt started smoking in 1956  . Alcohol Use: No  . Drug Use: No  . Sexually Active: No   Other Topics Concern  . Not on file   Social History Narrative   Married to [employer] has kidney and pancrease transplant    ROS ALL NEGATIVE EXCEPT THOSE NOTED IN  HPI  PE  General Appearance: well developed, well nourished in no acute distress chronically ill-appearing HEENT: symmetrical face, PERRLA, good dentition  Neck: no JVD, thyromegaly, or adenopathy, trachea midline Chest: symmetric without deformity Cardiac: PMI non-displaced, RRR, normal S1, S2, no gallop or murmur Lung: clear to ausculation and percussion Vascular: Reduced but present in the lower extremities. No ulcers. Abdominal: nondistended, nontender, good bowel sounds, no HSM, no bruits Extremities: no cyanosis, clubbing or edema, no sign of DVT, no varicosities  Skin: normal color, no rashes Neuro: alert and oriented x 3, non-focal Pysch: normal affect  EKG  BMET    Component Value Date/Time   NA 138 04/13/2012 1055   K 4.1 04/13/2012 1055   CL 102 04/13/2012 1055   CO2 28 04/13/2012 1055   GLUCOSE 195* 04/13/2012 1055   BUN 30* 04/13/2012 1055   CREATININE 1.2 04/13/2012 1055   CALCIUM 8.6 04/13/2012 1055   GFRNONAA >60 05/09/2011 0500   GFRAA  Value: >60        The eGFR has been calculated using the MDRD equation. This calculation has not been validated in all clinical situations. eGFR's persistently <60 mL/min signify possible Chronic Kidney Disease. 05/09/2011 0500    Lipid Panel     Component Value Date/Time   CHOL 186 09/23/2011 0839   TRIG 125.0 09/23/2011 0839   HDL 51.20 09/23/2011 0839   CHOLHDL 4 09/23/2011 0839   VLDL 25.0 09/23/2011 0839   LDLCALC 110* 09/23/2011 0839    CBC    Component Value Date/Time   WBC 6.4 04/06/2012 1603   RBC 3.49* 04/06/2012 1603   HGB 10.1* 04/06/2012 1603   HCT 31.3* 04/06/2012 1603   PLT 222.0 04/06/2012 1603   MCV 89.4 04/06/2012 1603   MCH 29.8 05/09/2011 0500   MCHC 32.4 04/06/2012 1603   RDW 16.0* 04/06/2012 1603   LYMPHSABS 1.3 04/06/2012 1603   MONOABS 0.6 04/06/2012 1603   EOSABS 0.2 04/06/2012 1603   BASOSABS 0.0 04/06/2012 1603

## 2012-04-29 NOTE — Assessment & Plan Note (Signed)
Improved with stable weight at the present time. Continue current dose of Lasix and followup with me in July. Goal weight is between 129 and 132.

## 2012-04-29 NOTE — Patient Instructions (Signed)
Your physician recommends that you continue on your current medications as directed. Please refer to the Current Medication list given to you today.  Keep your June follow-up appt. With Dr. Daleen Squibb.

## 2012-05-25 ENCOUNTER — Encounter: Payer: Self-pay | Admitting: Cardiology

## 2012-05-25 ENCOUNTER — Ambulatory Visit (INDEPENDENT_AMBULATORY_CARE_PROVIDER_SITE_OTHER): Payer: Medicare Other | Admitting: Cardiology

## 2012-05-25 VITALS — BP 122/56 | HR 65 | Ht 64.0 in | Wt 126.0 lb

## 2012-05-25 DIAGNOSIS — I2510 Atherosclerotic heart disease of native coronary artery without angina pectoris: Secondary | ICD-10-CM

## 2012-05-25 DIAGNOSIS — E7800 Pure hypercholesterolemia, unspecified: Secondary | ICD-10-CM

## 2012-05-25 DIAGNOSIS — I10 Essential (primary) hypertension: Secondary | ICD-10-CM

## 2012-05-25 DIAGNOSIS — R600 Localized edema: Secondary | ICD-10-CM

## 2012-05-25 DIAGNOSIS — I679 Cerebrovascular disease, unspecified: Secondary | ICD-10-CM

## 2012-05-25 DIAGNOSIS — I5032 Chronic diastolic (congestive) heart failure: Secondary | ICD-10-CM

## 2012-05-25 DIAGNOSIS — R609 Edema, unspecified: Secondary | ICD-10-CM

## 2012-05-25 DIAGNOSIS — I739 Peripheral vascular disease, unspecified: Secondary | ICD-10-CM

## 2012-05-25 MED ORDER — NITROGLYCERIN 0.4 MG SL SUBL: 0.4000 mg | SUBLINGUAL_TABLET | SUBLINGUAL | Status: DC | PRN

## 2012-05-25 MED ORDER — FUROSEMIDE 40 MG PO TABS: 40.0000 mg | ORAL_TABLET | Freq: Every day | ORAL | Status: DC

## 2012-05-25 NOTE — Progress Notes (Signed)
HPI Nicole [name] returns for close followup of her chronic diastolic heart failure. She has been weighing as little as 123. Her dry weight is around 129 01/07/1929 range. She is still taking Lasix 40 mg twice a day.  She says and dizzy spells while standing. She has not fainted. She's also had some angina requiring 2 sublingual nitroglycerin last week.  Past Medical History  Diagnosis Date  . Retinopathy   . Hypertension   . CHF (congestive heart failure)     has a normal EF per echo 03/2011 & 5/2013with diastolic dysfunction  . CAD (coronary artery disease)     Prior PCI in 1998 x 2; s/p cath in 2001, negative Myoview in 2008 & 2011, s/p cath in June 2012 with calcified disease of the proximal LCX with normal flow reserve. She has been managed medically due to her other morbidities  . Cerebrovascular disease   . Peripheral vascular disease     prior angiography showing RSFA stenosis, left anterior tibial and left posterior tibial stenoses  . Hypercholesterolemia   . Diabetes mellitus     insulin dependent  . Hypothyroidism   . Hiatal hernia   . Degenerative joint disease   . Osteoporosis   . Vitamin d deficiency   . Anemia of chronic disease   . Renal insufficiency     Current Outpatient Prescriptions  Medication Sig Dispense Refill  . acetaminophen (TYLENOL) 500 MG tablet Take 500 mg by mouth every 6 (six) hours as needed.        Marland Kitchen aspirin 81 MG tablet Take 81 mg by mouth daily.        . carbonyl iron (FEOSOL) 45 MG TABS Take 45 mg by mouth daily.      . carvedilol (COREG) 6.25 MG tablet TAKE ONE TABLET BY MOUTH TWICE DAILY WITH MEALS  60 tablet  6  . Cholecalciferol (VITAMIN D) 1000 UNITS capsule Take 1,000 Units by mouth daily.        . felodipine (PLENDIL) 10 MG 24 hr tablet Take 1 tablet (10 mg total) by mouth daily.  90 tablet  3  . furosemide (LASIX) 40 MG tablet Take 1 tablet (40 mg total) by mouth 2 (two) times daily.  180 tablet  3  . gabapentin (NEURONTIN) 100 MG  capsule Take 100 mg by mouth every other day.       . insulin glargine (LANTUS) 100 UNIT/ML injection Inject 25 Units into the skin at bedtime.       . insulin lispro (HUMALOG) 100 UNIT/ML injection Inject 5 to 15 units subcutaneously as needed before meals  10 mL  4  . levothyroxine (SYNTHROID) 75 MCG tablet Take 1 tablet (75 mcg total) by mouth daily.  90 tablet  3  . multivitamin (THERAGRAN) per tablet Take 1 tablet by mouth daily.        . nitroGLYCERIN (NITROSTAT) 0.4 MG SL tablet Place 0.4 mg under the tongue every 5 (five) minutes as needed. May repeat up to 3 doses       . pantoprazole (PROTONIX) 40 MG tablet Take 1 tablet (40 mg total) by mouth 2 (two) times daily. 30 minutes before breakfast and dinner  180 tablet  3  . pravastatin (PRAVACHOL) 80 MG tablet Take 80 mg by mouth daily.       . quinapril (ACCUPRIL) 40 MG tablet Take 1 tablet (40 mg total) by mouth daily.  90 tablet  3  . traMADol (ULTRAM) 50 MG tablet Take 50  mg by mouth every 6 (six) hours as needed.      . vitamin B-12 (CYANOCOBALAMIN) 1000 MCG tablet Take 1,000 mcg by mouth daily.      . vitamin C (ASCORBIC ACID) 500 MG tablet Take 500 mg by mouth daily. To take with iron tablet daily         Allergies  Allergen Reactions  . Ibuprofen     REACTION: edema---thyroid problems  . Prednisone     Causes severe fluid retention    No family history on file.  History   Social History  . Marital Status: Married    Spouse Name: Llana Aliment. Starks    Number of Children: 2  . Years of Education: N/A   Occupational History  . retired    Social History Main Topics  . Smoking status: Former Smoker    Types: Cigarettes    Quit date: 12/07/1974  . Smokeless tobacco: Never Used   Comment: pt started smoking in 1956  . Alcohol Use: No  . Drug Use: No  . Sexually Active: No   Other Topics Concern  . Not on file   Social History Narrative   Married to [employer] has kidney and pancrease transplant     ROS ALL NEGATIVE EXCEPT THOSE NOTED IN HPI  PE  General Appearance: well developed, well nourished in no acute distress HEENT: symmetrical face, PERRLA, good dentition  Neck: no JVD, thyromegaly, or adenopathy, trachea midline Chest: symmetric without deformity Cardiac: PMI non-displaced, RRR, normal S1, S2, no gallop or murmur Lung: clear to ausculation and percussion Vascular: Decreased pulses in her extremities which is baseline Abdominal: nondistended, nontender, good bowel sounds, no HSM, no bruits Extremities: no cyanosis, clubbing or edema, no sign of DVT, no varicosities  Skin: normal color, no rashes Neuro: alert and oriented x 3, non-focal Pysch: normal affect  EKG  BMET    Component Value Date/Time   NA 138 04/13/2012 1055   K 4.1 04/13/2012 1055   CL 102 04/13/2012 1055   CO2 28 04/13/2012 1055   GLUCOSE 195* 04/13/2012 1055   BUN 30* 04/13/2012 1055   CREATININE 1.2 04/13/2012 1055   CALCIUM 8.6 04/13/2012 1055   GFRNONAA >60 05/09/2011 0500   GFRAA  Value: >60        The eGFR has been calculated using the MDRD equation. This calculation has not been validated in all clinical situations. eGFR's persistently <60 mL/min signify possible Chronic Kidney Disease. 05/09/2011 0500    Lipid Panel     Component Value Date/Time   CHOL 186 09/23/2011 0839   TRIG 125.0 09/23/2011 0839   HDL 51.20 09/23/2011 0839   CHOLHDL 4 09/23/2011 0839   VLDL 25.0 09/23/2011 0839   LDLCALC 110* 09/23/2011 0839    CBC    Component Value Date/Time   WBC 6.4 04/06/2012 1603   RBC 3.49* 04/06/2012 1603   HGB 10.1* 04/06/2012 1603   HCT 31.3* 04/06/2012 1603   PLT 222.0 04/06/2012 1603   MCV 89.4 04/06/2012 1603   MCH 29.8 05/09/2011 0500   MCHC 32.4 04/06/2012 1603   RDW 16.0* 04/06/2012 1603   LYMPHSABS 1.3 04/06/2012 1603   MONOABS 0.6 04/06/2012 1603   EOSABS 0.2 04/06/2012 1603   BASOSABS 0.0 04/06/2012 1603

## 2012-05-25 NOTE — Assessment & Plan Note (Signed)
She is clinically dry today. She may be becoming orthostatic. We'll decrease her Lasix to 40 mg a day. We will continue this dose until she gets to 129 which is her dry weight. She knows that if her weight goes above 1:30 to or she starts developing edema she can increase her Lasix to twice a day. We will see her back again in 3 months.

## 2012-05-25 NOTE — Patient Instructions (Addendum)
Decrease your Lasix dose to 40mg  once daily.  Try to maintain your goal weight at 129lbs.  Increase Lasix to 40mg  twice daily if weight gets above 132lbs.  Your physician recommends that you schedule a follow-up appointment in: 3 months with Dr. Daleen Squibb.

## 2012-06-28 ENCOUNTER — Other Ambulatory Visit (INDEPENDENT_AMBULATORY_CARE_PROVIDER_SITE_OTHER): Payer: Medicare Other

## 2012-06-28 ENCOUNTER — Ambulatory Visit (INDEPENDENT_AMBULATORY_CARE_PROVIDER_SITE_OTHER): Payer: Medicare Other | Admitting: Pulmonary Disease

## 2012-06-28 ENCOUNTER — Encounter: Payer: Self-pay | Admitting: Pulmonary Disease

## 2012-06-28 VITALS — BP 158/64 | HR 57 | Temp 98.7°F | Ht 64.0 in | Wt 138.4 lb

## 2012-06-28 DIAGNOSIS — M810 Age-related osteoporosis without current pathological fracture: Secondary | ICD-10-CM

## 2012-06-28 DIAGNOSIS — R0600 Dyspnea, unspecified: Secondary | ICD-10-CM

## 2012-06-28 DIAGNOSIS — I679 Cerebrovascular disease, unspecified: Secondary | ICD-10-CM

## 2012-06-28 DIAGNOSIS — R0609 Other forms of dyspnea: Secondary | ICD-10-CM

## 2012-06-28 DIAGNOSIS — E039 Hypothyroidism, unspecified: Secondary | ICD-10-CM

## 2012-06-28 DIAGNOSIS — D638 Anemia in other chronic diseases classified elsewhere: Secondary | ICD-10-CM

## 2012-06-28 DIAGNOSIS — I10 Essential (primary) hypertension: Secondary | ICD-10-CM

## 2012-06-28 DIAGNOSIS — I739 Peripheral vascular disease, unspecified: Secondary | ICD-10-CM

## 2012-06-28 DIAGNOSIS — E7800 Pure hypercholesterolemia, unspecified: Secondary | ICD-10-CM

## 2012-06-28 DIAGNOSIS — E538 Deficiency of other specified B group vitamins: Secondary | ICD-10-CM

## 2012-06-28 DIAGNOSIS — E119 Type 2 diabetes mellitus without complications: Secondary | ICD-10-CM

## 2012-06-28 DIAGNOSIS — I2510 Atherosclerotic heart disease of native coronary artery without angina pectoris: Secondary | ICD-10-CM

## 2012-06-28 DIAGNOSIS — I5032 Chronic diastolic (congestive) heart failure: Secondary | ICD-10-CM

## 2012-06-28 DIAGNOSIS — D510 Vitamin B12 deficiency anemia due to intrinsic factor deficiency: Secondary | ICD-10-CM

## 2012-06-28 DIAGNOSIS — M1990 Unspecified osteoarthritis, unspecified site: Secondary | ICD-10-CM

## 2012-06-28 LAB — TSH: TSH: 2.62 u[IU]/mL (ref 0.35–5.50)

## 2012-06-28 LAB — CBC WITH DIFFERENTIAL/PLATELET
Basophils Absolute: 0 10*3/uL (ref 0.0–0.1)
Basophils Relative: 0.4 % (ref 0.0–3.0)
Eosinophils Absolute: 0.3 10*3/uL (ref 0.0–0.7)
Lymphocytes Relative: 25.9 % (ref 12.0–46.0)
MCHC: 33.7 g/dL (ref 30.0–36.0)
Neutrophils Relative %: 56.4 % (ref 43.0–77.0)
Platelets: 208 10*3/uL (ref 150.0–400.0)
RBC: 3.62 Mil/uL — ABNORMAL LOW (ref 3.87–5.11)

## 2012-06-28 LAB — BASIC METABOLIC PANEL
BUN: 27 mg/dL — ABNORMAL HIGH (ref 6–23)
CO2: 26 mEq/L (ref 19–32)
Chloride: 104 mEq/L (ref 96–112)
Creatinine, Ser: 0.9 mg/dL (ref 0.4–1.2)

## 2012-06-28 LAB — BRAIN NATRIURETIC PEPTIDE: Pro B Natriuretic peptide (BNP): 213 pg/mL — ABNORMAL HIGH (ref 0.0–100.0)

## 2012-06-28 LAB — LDL CHOLESTEROL, DIRECT: Direct LDL: 130.2 mg/dL

## 2012-06-28 LAB — HEPATIC FUNCTION PANEL
ALT: 15 U/L (ref 0–35)
AST: 20 U/L (ref 0–37)
Bilirubin, Direct: 0.1 mg/dL (ref 0.0–0.3)
Total Protein: 6.4 g/dL (ref 6.0–8.3)

## 2012-06-28 LAB — LIPID PANEL
Cholesterol: 216 mg/dL — ABNORMAL HIGH (ref 0–200)
Total CHOL/HDL Ratio: 3

## 2012-06-28 LAB — IBC PANEL
Iron: 68 ug/dL (ref 42–145)
Transferrin: 212.8 mg/dL (ref 212.0–360.0)

## 2012-06-28 MED ORDER — TRAMADOL HCL 50 MG PO TABS: 50.0000 mg | ORAL_TABLET | Freq: Four times a day (QID) | ORAL | Status: DC | PRN

## 2012-06-28 NOTE — Progress Notes (Signed)
Subjective:    Patient ID: Nicole [name], female    DOB: 05/06/1935, 77 y.o.   MRN: [id number]  HPI 77 y/o WF here for a follow up visit... she has mult med problems including IDDM w/ hx poor control;  HBP;  CAD followed by DrWall;  Cerebrovasc & Peripheral vasc dis;  Hyperlipidemia;  Hypothyroidism;  Large HH;  DJD w/ neck & LBP;  Osteoporosis & Vit D defic;  Hx Anemia...  ~  February 24, 2011:  6 month ROV & c/o left sided neck pain x 33mo started while making fudge in Dec> c/o tight leaders, popping & cracking, HA, etc; radiating to shoulder & up to head, no weakness/ numbness, etc;  She has been evaluated by DrBrooks, [facility], in the past w/ prev XRays she says (he wants to give shots, she declined- we don't have records);  She can't get MRIs due to screws in left hip;  We discussed rx w/ Pred Dosepak, Tramadol/ Tylenol. Add Robaxin 500mg Tid, rest/ heat, & she will call DrBrooks for f/u...    Ortho followed by DrOlin/ Ramos at [facility] for prev left fem neck fracture 5/09 w/ closed reduction & percut screw fixation... severe LBP & pain all over- given epid steroid shots w/ min relief; pain Rx w/ DCN in the past was too strong (sedated) therefore try Tramadol...    Cards followed by DrWall- HBP, diffuse vascular dis, r/o CAD, hyperlipidemia> stable w/ severe disease on Atenolol, Quinapril, Felodipine, Lasix;  BP today= 126/60 & denies CP, palpit, change in dyspnea, edema, etc... She is way too sedentary because of her Ortho problems... She last saw DrWall 11/11 & his note is reviewed- stable, no changes made...    Hyperchol>  She had FLP done by [facility] 1/12 on Crestor20mg /d w/ TChol 166, TG 122, HDL 43, LDL 99> he wanted to incr the Crestor to 40mg  but pt declined 7 preferred to keep it at 20mg /d & work on diet & wt reduction...    Her main problem is brittle DM> difficult contol on Lantus/ Humalog w/ freq adjustments & tried on Levemir but intol ("the shots burned & made me weak")... she  refused Endocrine referral... she has DM retinopathy w/ macular edema followed in the [facility] clinic... We have been trying to get her to slowly increase her Lantus by 2u increments titrating it to her FBS;  But she has refused, thwarted our attempts at better control by always returning her Lantus to 40u daily;  We discussed this strategy again today...  ~  August 31, 2011:  30mo ROV & she complains of freq hypoglycemia in early AM hours- she's been taking Lantus 40u and Humalog SS betw 7-10u at mealtimes; her accuchecks are all over the place & she will ret for FASTING blood work (see below)>>    Retinopathy> followed at [facility] (we don't have notes); she is blind in the right eye...    HBP> controlled on Coreg6.25Bid, Plendil10, Quinapril40, Lasix20; BP= 142/60 & even better at home she says...     CAD> She had extensive Cards eval by DrStuckey & DrWall; Caths 4/12 & 6/12 showed worrisome lesion in proxCFX- they decided on medical therapy (see below, & DrWalls notes are reviewed); she denies angina, palpit, ch in chr DOE, edema, etc...    ASPVD> CDopplers 7/12 stable, no cerebral ischemic symptoms; stable PAD w/o claud & known sm vessel dis distally, no LE lesions etc...    CHOL> on Lip40 & diet ==> FLP  shows TChol 186, TG 125, HDL 51, LDL 110    DM> as above, on Lantus/ Humalog w/ BS up & down at home ==> FBS=149 & A1c=9.4    Hypothy> stable on Levothy75...    GI> back on Protonix 40mg /d...    DJD>  Still having lots of back pain & followed by Mayford Knife; on Tramadol prn...    Anemia> she is off her prev iron therapy but still taking lots of Vits etc...  ~  February 29, 2012:  7mo ROV & she has mult somatic complaints> c/o sinusitis w/ drainage/ cough/ hoarse, drainage wakes her at night, no energy, incr SOB, etc;  Also notes legs hurt, tender, "my legs are hard", hurts to walk, more sedentary; DrWall started Neurontin 100mg  Tid for prob neuropathy related to her DM;  Husb passed away 12/12 w/  severe dementia, pneumonia, UTI, in hosp 3wks...    Retinopathy> followed at [facility] (we don't have notes); she is blind in the right eye...    HBP> controlled on Coreg6.25Bid, Plendil10, Quinapril40, Lasix20Bid; BP= 126/68 & similar at home she says...     CAD> She had extensive Cards eval by DrStuckey & DrWall; Caths 4/12 & 6/12 showed worrisome lesion in proxCFX- they decided on medical therapy (see below, & DrWall's notes are reviewed); she denies angina, palpit, ch in chr DOE, edema, etc...    ASPVD> CDopplers 7/12 stable, no cerebral ischemic symptoms; stable PAD w/o claud & known sm vessel dis distally, no LE lesions etc...    CHOL> on Prav40 now per DrWall, plus diet, not fasting today; FLP 9/12 on Lip40 showed TChol 186, TG 125, HDL 51, LDL 110    DM> on XLKGMW10 +SSI Humalog5-15Tid w/ BS up & down at home ==> FBS=83 & A1c=9.4    Hypothy> stable on Levothy75...    GI> back on Protonix 40mg /d...    DJD>  Still having lots of back pain & followed by [facility]; on Tramadol prn & Neurontin 100Tid added by DrWall for leg pain, prob neuropathy...    Anemia> she is off her prev iron therapy but still taking lots of Vits etc; Hg=9.4, Fe=30, B12=215, SPE= ok... LABS 3/25:  Chems- ok x BS=83 A1c=9.4 BUN=31 Creat=1.3 BNP=265;  CBC- Hg=9.4 Fe=30(8%), B12=215;  SPE/IEP- neg no monoclonal prot & Ig's ok...  ~  June 28, 2012:  86mo ROV & Esbeydi has been seen by Cards x4 recently for Rx of her CHF> diuresed 23# of fluid betw checks by Karlyne Greenspan & DrWall, now back on Lasix40mg Qam w/ BNP=213 & wt= 138# (down 4# from 3/13 OV); her wt at home was 133# & she was instructed to incr the Lasix 40==>80 if her wt was over 132# (we reviewed these recs from Cards today)...     She is also c/o pain in her back & hips and wants f/u appt w/ DrOlin due to the pain & poss of THR; she will try TRAMADOL 50mg /d in the interim...    Finally she noted c/o hair loss 7 she has started on Biotin per her beautician; I have rec f/u  w/ her Derm... We reviewed prob list, meds, xrays and labs> see below>> LABS 7/13:  FLP- not at goal on Prav80 per DrWall;  Chems- ok x BS=153 A1c=11.2;  CBC- ok x Hg=11.0 Fe=68;  TSH=2.62;  BNP=213;  B12=481          Problem List:  RETINOPATHY (ICD-363.20) - followed at [facility] DrKarup (we don't have notes)... 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  HYPERTENSION (ICD-401.9) - now on COREG 6.25mg Bid, QUINAPRIL 40mg /d, FELODIPINE 10mg /d & LASIX 40mg /d...  ~  9/12: BP= 142/60 & 120-130 at home; she denies CP, palpit, change in dyspnea, PND, etc... ~  3/13: BP= 126/68 & similar at home; she is too sedentary but denies CP, palpit, edema... ~  7/13:  BP= 158/64 & didn't take meds today, reminded to take meds daily, same time each day; BUN=27, Creat=0.9, BNP=213...  CAD (ICD-414.00) &  CHRONIC DIASTOLIC CHF - on MEDS above... DrWall's & DrStuckey's notes are reviewed- denies recurrent angina, palpit, change in dyspnea or edema... we reviewed risk factor reduction strategy... ~  Prior stent in marginal branch of Circ in 1998 ~  Myoview 4/10 was negative- no scar or ischemia, EF= 73%... ~  labs 3/11 showed BUN=18, Crear=1.0, BNP=107, & EKG essent WNL.Marland Kitchen. ~  4/12:  Hosp w/ Diastolic CHF after Pred Rx and fluid retention; diuresed & improved... ~  2DEcho 4/12 showed EF 65-70% with normal wall motion & grade 2 DD, mildMR, mild LAdil... ~  Cath 4/12 showed  50% midLAD, 90% calcified stenosis in proxCIRC, patent stent in second obtuse marginal, 70-80% proxRCA (non-dominent, sm vessel), EF=65%, LVEDP=38mmHg. ~  [facility] 6/12 for re-cath by DrStuckey> He discussed results in detail with the patient, explaining that she "did not have evidence of USAP, with an FFR of 0.96> medical therapy was recommended".  CEREBROVASCULAR DISEASE (ICD-437.9) - on ASA daily... no cerebral ischemic symptoms. ~  CDoppler 4/08 showing stable bilat carotid dis w/ 40-59% bilat  stenoses... ~  repeat studies 4/09 showed mild smooth heterogeneous plaque w/ 40-59% bilat ICA stenoses. ~  f/u studies 4/10 showed mild to mod heterogen plaque bilat- 40-59% bilat stenoses, stable. ~  CDopplers 5/11 showed stable mild irreg plaque, 40-59% bilat ICAstenoses, no change. ~  CDopplers 7/12 showed stable mild heterogeneous plaque bilat w/ 40-59% bilat ICA stenoses; f/u planned 61yr...  PERIPHERAL VASCULAR DISEASE (ICD-443.9) - abnormal ABI's and eval by DrDowney in 3/08 (& prev DrGupta 2004)... she had arteriogram w/ bilat dis but no indication for PTA... 20% bilat ostial renal lesions also noted... no complaints of claudication etc, ambulates w/ cane, BP controlled. ~  LE Vasc study 4/10 showed known diffuse bilat arterial occlusive dis- stable large vessel dis & progressive sm vessel dis w/ toe/ brachial indicies at risk for tissue loss... DrWall is following.  HYPERCHOLESTEROLEMIA (ICD-272.0) - now on PRAVASTATIN 80mg /d per DrWall + FishOil & CoQ10... ~  FLP 3/08 showed TChol 158, Tg 119, HDL 43, LDL 92 ~  FLP 3/09 showed TChol 173, TG 151, HDL 41, LDL 102 ~  FLP 1/10 off med x 87mo showed TChol 349, TG 173, HDL 45, LDL 258... rec change Statin, but she prefers Cres20. ~  FLP 5/10 on Cres20 showed TChol 179, TG 113, HDL 52, LDL 104... rec> continue Cres20. ~  FLP 3/11 on Cres20 showed TChol 179, TG 125, HDL 50, LDL 104... She would not incr dose & wanted off Cres due to $$ & aching. ~  FLP 10/12 on Lip40 showed TChol 186, TG 125, HDL 51, LDL 110... She will not incr the dose. ~  DrWall switched her to PRAVASTATIN 40mg  taking 1.5 tab Qd ==> then 2 tabs daily. ~  FLP 7/13 on Prav80 per DrWall showed TChol 216, TG 96, HDL 63, LDL 130... Not at goals, copy sent to Cards.  DIABETES MELLITUS (  ICD-250.00) - very brittle DM, insulin dependent and prev Rx by Endocrinology... she has retinopathy, neuropathy, and nephropathy w/ proteinuria noted (on ACE & Felodipine)... currently taking LANTUS  40 u daily, and HUMALOG 5-15 before each meal... we have discussed adjustments designed to improve her overall control without resulting hypoglycemia... ~  labs 9/08 showed BS= 171, HgA1c= 8.7 ~  labs 3/09 showed BS= 195, HgA1c= 8.6.Marland KitchenMarland Kitchen rec to slowly incr her insulin doses. ~  labs 6/09 showed BS= 287, HgA1c= 8.4.Marland KitchenMarland Kitchen offered Endocrine referral but she refuses, rec incr Lantus & Humalog. ~  labs 1/10 showed BS= 255, HgA1c= 9.4.Marland Kitchen. rec> incr Lantus and Humalog slowly. ~  labs 5/10 showed BS= 264, A1c= 8.5.Marland KitchenMarland Kitchen still needs incr insulin. ~  labs 9/10 showed BS= 220, A1c= 8.1 ~  labs 3/11 showed BS= 181, A1c= 9.0..Marland Kitchen she is reluctant to incr insulin, rec to switch to LEVEMIR 40u/d (she was intol). ~  labs 9/11 showed BS= 257, A1c= 9.1.Marland KitchenMarland Kitchen rec slowly incr Lantus by 2u increments until FBS is <150... ~  Labs 10/12 on Lantus40+SSHumalog showed BS= 149, A1c= 9.4.... She refuses to ret to Endocrine consultant. ~  Labs 3/13 showed BS= 83, A1c= 9.4... Once again asked to incr Lantus by 2u increments until FBS is <150... ~  Labs 7/13 on Lantus40+SSHumalog showed BS= 153, A1c= 11.2... She is admonished to incr the Lantus dose!  HYPOTHYROIDISM (ICD-244.9) - on SYNTHROID 60mcg/d...   ~  labs 3/08 showed TSH = 2.0 ~  labs 3/09 showed TSH = 2.93 ~  labs 5/10 showed TSH = 3.46 ~  labs 3/11 showed TSH= 3.15 ~  Labs 4/12 showed TSH= 3.70 ~  Labs 3/13 showed TSH= 5.69... Reminded to take Qam on empty stomach... ~  Labs 7/13 showed TSH= 2.62  HIATAL HERNIA (ICD-553.3) - she is off her Protonix and taking PRILOSEC 20mg /d... ~  EGD 12/03 showed 5cmHH, tortuous & spastic esop, chr atrophic gastritis... Nexium recommended. ~  colonoscopy 12/03 was WNL x for decr rectal tone noted... incr fiber recommended.  DEGENERATIVE JOINT DISEASE (ICD-715.90) - "my back's in bad shape" per DrOlin & Shon Baton- nothing can be done surgically to help her... ~  5/09: fall w/ left hip fx- Rx DrOlin w/ closed reduction & percut screw  fixation... she's been told she needs THR. ~  Back eval DrRamos 12/09 w/ epid steroid shot 1/10 without much benefit... ~  6/11:  she reports repeat epid shots by DrRamos helped somewhat & she will f/u w/ him... ~  9/11:  c/o hurting all over & TRAMADOL 50mg  Qid Prn tried... ~  7/13:  She is c/o back & hip pain; Tramadol refilled; asking to see DrOlin again...  OSTEOPOROSIS (ICD-733.00), & VITAMIN D DEFICIENCY (ICD-268.9) - followed by GYN= DrTomblin w/ BMD 12/09 per pt ?results... not on bisphos Rx- she takes Calcium, Vits, & had Vit D defic w/ level= 8 per pt hx treated transiently w/ 50K weekly- now on 1000 u daily & level pending at DrTomblin's office.  ANEMIA OF CHRONIC DISEASE (ICD-285.29) - on FeSO4, Vit C, Vit B12... ~  labs 9/08 showed Hg= 11.3  ~  labs 3/09 showed Hg= 11.2 ~  labs 5/10 showed Hg= 11.4 ~  labs 3/11 showed Hg= 11.6 ~  Labs 6/12 showed Hg= 9.6 ~  Labs 3/13 showed Hg= 9.4, Fe=30 (8%) ~  Labs 7/13 showed Hg= 11.0, Fe= 68 (23%sat), B12= 481...   Past Surgical History  Procedure Date  . Abdominal aortogram 07/27/2003  by Dr. Chales Abrahams  . Bilateral lower extremity angiogram 07/27/2003    by Dr. Chales Abrahams  . Selective angiography,right superficial femoral artery, right and left iliac arteries 07/27/2003    by Dr. Chales Abrahams  . Measurement of gradient right and left iliac arteries 07/27/2003    by Dr. Chales Abrahams  . Bilateral cataract sugery 2000 and 2002    by Dr. Cecilie Kicks  . Repair left hip fracture 04/2008    by Dr. Charlann Boxer    Outpatient Encounter Prescriptions as of 06/28/2012  Medication Sig Dispense Refill  . acetaminophen (TYLENOL) 500 MG tablet Take 500 mg by mouth every 6 (six) hours as needed.        Marland Kitchen aspirin 81 MG tablet Take 81 mg by mouth daily.        . carbonyl iron (FEOSOL) 45 MG TABS Take 45 mg by mouth daily.      . carvedilol (COREG) 6.25 MG tablet TAKE ONE TABLET BY MOUTH TWICE DAILY WITH MEALS  60 tablet  6  . Cholecalciferol (VITAMIN D) 1000 UNITS capsule Take  1,000 Units by mouth daily.        . felodipine (PLENDIL) 10 MG 24 hr tablet Take 1 tablet (10 mg total) by mouth daily.  90 tablet  3  . furosemide (LASIX) 40 MG tablet Take 1 tablet (40 mg total) by mouth daily.  180 tablet  3  . gabapentin (NEURONTIN) 100 MG capsule Take 100 mg by mouth every other day.       . insulin glargine (LANTUS) 100 UNIT/ML injection Inject 25 Units into the skin at bedtime.       . insulin lispro (HUMALOG) 100 UNIT/ML injection Inject 5 to 15 units subcutaneously as needed before meals  10 mL  4  . levothyroxine (SYNTHROID) 75 MCG tablet Take 1 tablet (75 mcg total) by mouth daily.  90 tablet  3  . multivitamin (THERAGRAN) per tablet Take 1 tablet by mouth daily.        . nitroGLYCERIN (NITROSTAT) 0.4 MG SL tablet Place 1 tablet (0.4 mg total) under the tongue every 5 (five) minutes as needed. May repeat up to 3 doses  25 tablet  3  . pantoprazole (PROTONIX) 40 MG tablet Take 1 tablet (40 mg total) by mouth 2 (two) times daily. 30 minutes before breakfast and dinner  180 tablet  3  . pravastatin (PRAVACHOL) 80 MG tablet Take 80 mg by mouth daily.       . quinapril (ACCUPRIL) 40 MG tablet Take 1 tablet (40 mg total) by mouth daily.  90 tablet  3  . traMADol (ULTRAM) 50 MG tablet Take 50 mg by mouth every 6 (six) hours as needed.      . vitamin B-12 (CYANOCOBALAMIN) 1000 MCG tablet Take 1,000 mcg by mouth daily.      . vitamin C (ASCORBIC ACID) 500 MG tablet Take 500 mg by mouth daily. To take with iron tablet daily         Allergies  Allergen Reactions  . Ibuprofen     REACTION: edema---thyroid problems  . Prednisone     Causes severe fluid retention    Current Medications, Allergies, Past Medical History, Past Surgical History, Family History, and Social History were reviewed in [employer] record.   Review of Systems   Constitutional:  Denies F/C/S, anorexia, unexpected weight change. HEENT:  Occas HAs; but no visual changes,  earache, nasal symptoms, sore throat, hoarseness. Resp:  No cough, sputum, hemoptysis;  no ch DOE, & no tightness or wheezing. Cardio:  No CP, palpit, orthopnea, edema. GI:  Denies N/V/D/C or blood in stool; no reflux, abd pain, distention, or gas. GU:  No dysuria, freq, urgency, hematuria, or flank pain. MS:  Mod joint pains, w/ mild swelling, tenderness, & decr ROM... Neuro:  No tremors, seizures, dizziness, syncope; sl weakness & multifactorial gait abn. Skin:  No suspicious lesions or skin rash. Heme:  No adenopathy, bruising, bleeding. Psyche: Denies confusion, sleep disturbance, hallucinations; mod anxiety caring for husb (Alz).   Objective:   Physical Exam   WD, WN, 77 y/o WF in NAD; she is chr ill appearing... Vital Signs:  Reviewed...BP= 126/60 General:  Alert & oriented; pleasant & cooperative... HEENT:  [place]/AT, EOM-wnl, PERRLA, EACs-clear, TMs-wnl, NOSE-clear, THROAT-clear & wnl. Neck:  Sl tender w/ decr ROM; no JVD; normal carotid impulses w/o bruits; no thyromegaly or nodules palpated; no lymphadenopathy. Chest:  Clear to P & A; without wheezes/ rales/ or rhonchi heard... Heart:  Regular Rhythm; norm S1 & S2, & gr 1/6 SEM w/o rubs or gallops detected... Abdomen:  Soft & nontender; normal bowel sounds; no organomegaly or masses palpated... Ext:  Decr ROM; +heberden's etc & mod arthritic changes; no varicose veins, +venous insuffic, tr edema;  Pulses intact w/o bruits... Neuro:  CNs II-XII intact; motor testing normal; sensory testing normal; abn gait & balance only fair... Derm:  No lesions noted; no rash etc... Lymph:  No cervical, supraclavicular, axillary, or inguinal adenopathy palpated...   RADIOLOGY DATA:  Reviewed in the EPIC EMR & discussed w/ the patient...  LABORATORY DATA:  Reviewed in the EPIC EMR & discussed w/ the patient...   Assessment & Plan:   DM> as above, on Lantus/ Humalog w/ BS up & down at home ==> FBS=153 & A1c=11.2; we reviewed diet/ exercise/  insulin adjustment based on her accuchecks; again I strongly requested Endocrine referral but she refuses...   Retinopathy> followed at [facility] (we don't have notes); she is blind in the right eye...     HBP> controlled on Coreg6.25Bid, Plendil10, Quinapril40, Lasix40; BP= not optimal today but better at home she says...      CAD> She had extensive Cards eval by DrStuckey & DrWall; Caths 4/12 & 6/12 showed worrisome lesion in proxCFX- they decided on medical therapy (see above, & DrWalls notes are reviewed); she denies angina, palpit, ch in chr DOE, edema, etc; recent DD and vigorous diuresis by Cards w/ 23# fluid reduction & improved...     ASPVD> CDopplers 7/12 stable, no cerebral ischemic symptoms; stable PAD w/o claud & known sm vessel dis distally, no LE lesions etc...     CHOL> on Prav80 now per DrWall & diet ==> FLP not at goal & she has Cards f/u soon...        Hypothy> stable on Levothy75...     GI> back on Protonix 40mg /d...     DJD>  Still having lots of back pain & followed by Mayford Knife; on Tramadol prn...     Anemia> Improved back on prev iron therapy; still taking lots of Vits etc; rec to continue FeSO4, Vit C & Vit B12 1055mcg/d...   Patient's Medications  New Prescriptions   No medications on file  Previous Medications   ACETAMINOPHEN (TYLENOL) 500 MG TABLET    Take 500 mg by mouth every 6 (six) hours as needed.     ASPIRIN 81 MG TABLET    Take 81 mg by mouth daily.  B COMPLEX VITAMINS TABLET    Take 1 tablet by mouth daily.   BIOTIN 1000 MCG TABLET    Take 1,000 mcg by mouth daily.   CARBONYL IRON (FEOSOL) 45 MG TABS    Take 45 mg by mouth daily.   CARVEDILOL (COREG) 6.25 MG TABLET    TAKE ONE TABLET BY MOUTH TWICE DAILY WITH MEALS   CHOLECALCIFEROL (VITAMIN D) 1000 UNITS CAPSULE    Take 1,000 Units by mouth daily.     FELODIPINE (PLENDIL) 10 MG 24 HR TABLET    Take 1 tablet (10 mg total) by mouth daily.   FUROSEMIDE (LASIX) 40 MG TABLET    Take 1 tablet  (40 mg total) by mouth daily.   GABAPENTIN (NEURONTIN) 100 MG CAPSULE    Take 100 mg by mouth every other day.    INSULIN LISPRO (HUMALOG) 100 UNIT/ML INJECTION    Inject 5 to 15 units subcutaneously as needed before meals   LEVOTHYROXINE (SYNTHROID) 75 MCG TABLET    Take 1 tablet (75 mcg total) by mouth daily.   MULTIVITAMIN (THERAGRAN) PER TABLET    Take 1 tablet by mouth daily.     NITROGLYCERIN (NITROSTAT) 0.4 MG SL TABLET    Place 1 tablet (0.4 mg total) under the tongue every 5 (five) minutes as needed. May repeat up to 3 doses   PANTOPRAZOLE (PROTONIX) 40 MG TABLET    Take 1 tablet (40 mg total) by mouth 2 (two) times daily. 30 minutes before breakfast and dinner   PRAVASTATIN (PRAVACHOL) 80 MG TABLET    Take 80 mg by mouth daily.    QUINAPRIL (ACCUPRIL) 40 MG TABLET    Take 1 tablet (40 mg total) by mouth daily.   VITAMIN B-12 (CYANOCOBALAMIN) 1000 MCG TABLET    Take 1,000 mcg by mouth daily.   VITAMIN C (ASCORBIC ACID) 500 MG TABLET    Take 500 mg by mouth daily. To take with iron tablet daily   Modified Medications   Modified Medication Previous Medication   INSULIN GLARGINE (LANTUS) 100 UNIT/ML INJECTION insulin glargine (LANTUS) 100 UNIT/ML injection      Inject 30 Units into the skin at bedtime.    Inject 25 Units into the skin at bedtime.    TRAMADOL (ULTRAM) 50 MG TABLET traMADol (ULTRAM) 50 MG tablet      Take 1 tablet (50 mg total) by mouth every 6 (six) hours as needed.    Take 50 mg by mouth every 6 (six) hours as needed.  Discontinued Medications   No medications on file

## 2012-06-28 NOTE — Patient Instructions (Addendum)
Today we updated your med list in our EPIC system...    Continue your current medications the same...  Be sure to take the LASIX as directed by DrWall- between 40-80mg  per day as directed...  Today we did your folow up FASTING blood work...    We will call you w/ the results when avail...  Call for any questions...  Let's continue our 4 month follow up visits to monitor your progress.Marland KitchenMarland Kitchen

## 2012-06-29 ENCOUNTER — Other Ambulatory Visit: Payer: Self-pay | Admitting: Pulmonary Disease

## 2012-06-29 ENCOUNTER — Other Ambulatory Visit: Payer: Self-pay | Admitting: Cardiology

## 2012-06-29 DIAGNOSIS — I6529 Occlusion and stenosis of unspecified carotid artery: Secondary | ICD-10-CM

## 2012-06-29 MED ORDER — INSULIN GLARGINE 100 UNIT/ML ~~LOC~~ SOLN: 30.0000 [IU] | Freq: Every day | SUBCUTANEOUS | Status: DC

## 2012-07-11 ENCOUNTER — Encounter (INDEPENDENT_AMBULATORY_CARE_PROVIDER_SITE_OTHER): Payer: Medicare Other

## 2012-07-11 DIAGNOSIS — R0989 Other specified symptoms and signs involving the circulatory and respiratory systems: Secondary | ICD-10-CM

## 2012-07-11 DIAGNOSIS — I6529 Occlusion and stenosis of unspecified carotid artery: Secondary | ICD-10-CM

## 2012-07-15 ENCOUNTER — Telehealth: Payer: Self-pay | Admitting: Cardiology

## 2012-07-15 NOTE — Telephone Encounter (Signed)
Pt aware of carotid results Debbie Maximilian Tallo RN  

## 2012-07-15 NOTE — Telephone Encounter (Signed)
Pt rtn call re results from test Monday, pls call [phone]

## 2012-08-15 ENCOUNTER — Telehealth: Payer: Self-pay | Admitting: Pulmonary Disease

## 2012-08-15 MED ORDER — FELODIPINE ER 10 MG PO TB24: 10.0000 mg | ORAL_TABLET | Freq: Every day | ORAL | Status: DC

## 2012-08-15 MED ORDER — QUINAPRIL HCL 40 MG PO TABS: 40.0000 mg | ORAL_TABLET | Freq: Every day | ORAL | Status: DC

## 2012-08-15 NOTE — Telephone Encounter (Signed)
I spoke pt and is aware RX's have been sent to [facility]. Nothing further was needed

## 2012-08-22 ENCOUNTER — Ambulatory Visit (INDEPENDENT_AMBULATORY_CARE_PROVIDER_SITE_OTHER): Payer: Medicare Other | Admitting: Cardiology

## 2012-08-22 ENCOUNTER — Encounter: Payer: Self-pay | Admitting: Cardiology

## 2012-08-22 VITALS — BP 128/46 | HR 64 | Resp 12 | Ht 61.0 in | Wt 136.0 lb

## 2012-08-22 DIAGNOSIS — I2510 Atherosclerotic heart disease of native coronary artery without angina pectoris: Secondary | ICD-10-CM

## 2012-08-22 DIAGNOSIS — I739 Peripheral vascular disease, unspecified: Secondary | ICD-10-CM

## 2012-08-22 DIAGNOSIS — E7800 Pure hypercholesterolemia, unspecified: Secondary | ICD-10-CM

## 2012-08-22 DIAGNOSIS — I679 Cerebrovascular disease, unspecified: Secondary | ICD-10-CM

## 2012-08-22 DIAGNOSIS — R609 Edema, unspecified: Secondary | ICD-10-CM

## 2012-08-22 DIAGNOSIS — R600 Localized edema: Secondary | ICD-10-CM

## 2012-08-22 DIAGNOSIS — I10 Essential (primary) hypertension: Secondary | ICD-10-CM

## 2012-08-22 DIAGNOSIS — I5032 Chronic diastolic (congestive) heart failure: Secondary | ICD-10-CM

## 2012-08-22 NOTE — Assessment & Plan Note (Signed)
She is at high risk of having some tissue problems or ulcers in the future. She has a consultation with Dr. Clydell Hakim. He wanted peripheral arterial Dopplers which I agree with. This may all be diabetic neuropathy. Patient is to be extremely careful with her feet and toenails.

## 2012-08-22 NOTE — Assessment & Plan Note (Signed)
Stable. No change in medical therapy. 

## 2012-08-22 NOTE — Progress Notes (Signed)
HPI Mrs. Bigbee returns today for close monitoring of her chronic diastolic heart failure. Is unremarkably stable with a stable weight and no edema. She denies orthopnea, PND or increased dyspnea on exertion.  Her biggest problem has been her right foot feeling very thick and numb. She has an appointment with Dr. Lestine Box of orthopedic surgery in the near future. He requested arterial Dopplers. I don't think she walks enough to have claudication.  Past Medical History  Diagnosis Date  . Retinopathy   . Hypertension   . CHF (congestive heart failure)     has a normal EF per echo 03/2011 & 5/2013with diastolic dysfunction  . CAD (coronary artery disease)     Prior PCI in 1998 x 2; s/p cath in 2001, negative Myoview in 2008 & 2011, s/p cath in June 2012 with calcified disease of the proximal LCX with normal flow reserve. She has been managed medically due to her other morbidities  . Cerebrovascular disease   . Peripheral vascular disease     prior angiography showing RSFA stenosis, left anterior tibial and left posterior tibial stenoses  . Hypercholesterolemia   . Diabetes mellitus     insulin dependent  . Hypothyroidism   . Hiatal hernia   . Degenerative joint disease   . Osteoporosis   . Vitamin d deficiency   . Anemia of chronic disease   . Renal insufficiency     Current Outpatient Prescriptions  Medication Sig Dispense Refill  . acetaminophen (TYLENOL) 500 MG tablet Take 500 mg by mouth every 6 (six) hours as needed.        Marland Kitchen aspirin 81 MG tablet Take 81 mg by mouth daily.        Marland Kitchen b complex vitamins tablet Take 1 tablet by mouth daily.      . Biotin 1000 MCG tablet Take 1,000 mcg by mouth daily.      . carbonyl iron (FEOSOL) 45 MG TABS Take 45 mg by mouth daily.      . carvedilol (COREG) 6.25 MG tablet TAKE ONE TABLET BY MOUTH TWICE DAILY WITH MEALS  60 tablet  6  . Cholecalciferol (VITAMIN D) 1000 UNITS capsule Take 1,000 Units by mouth daily.        . felodipine (PLENDIL) 10  MG 24 hr tablet Take 1 tablet (10 mg total) by mouth daily.  90 tablet  1  . furosemide (LASIX) 40 MG tablet Take 1 tablet (40 mg total) by mouth daily.  180 tablet  3  . insulin glargine (LANTUS) 100 UNIT/ML injection Inject 30 Units into the skin at bedtime.      . insulin lispro (HUMALOG) 100 UNIT/ML injection Inject 5 to 15 units subcutaneously as needed before meals  10 mL  4  . levothyroxine (SYNTHROID) 75 MCG tablet Take 1 tablet (75 mcg total) by mouth daily.  90 tablet  3  . multivitamin (THERAGRAN) per tablet Take 1 tablet by mouth daily.        . nitroGLYCERIN (NITROSTAT) 0.4 MG SL tablet Place 1 tablet (0.4 mg total) under the tongue every 5 (five) minutes as needed. May repeat up to 3 doses  25 tablet  3  . pantoprazole (PROTONIX) 40 MG tablet Take 1 tablet (40 mg total) by mouth 2 (two) times daily. 30 minutes before breakfast and dinner  180 tablet  3  . pravastatin (PRAVACHOL) 80 MG tablet Take 80 mg by mouth daily.       . quinapril (ACCUPRIL) 40 MG tablet  Take 1 tablet (40 mg total) by mouth daily.  90 tablet  1  . traMADol (ULTRAM) 50 MG tablet Take 1 tablet (50 mg total) by mouth every 6 (six) hours as needed.  100 tablet  5  . vitamin B-12 (CYANOCOBALAMIN) 1000 MCG tablet Take 1,000 mcg by mouth daily.      . vitamin C (ASCORBIC ACID) 500 MG tablet Take 500 mg by mouth daily. To take with iron tablet daily         Allergies  Allergen Reactions  . Ibuprofen     REACTION: edema---thyroid problems  . Prednisone     Causes severe fluid retention    No family history on file.  History   Social History  . Marital Status: Married    Spouse Name: Llana Aliment. Lenderman    Number of Children: 2  . Years of Education: N/A   Occupational History  . retired    Social History Main Topics  . Smoking status: Former Smoker    Types: Cigarettes    Quit date: 12/07/1974  . Smokeless tobacco: Never Used   Comment: pt started smoking in 1956  . Alcohol Use: No  . Drug Use: No    . Sexually Active: No   Other Topics Concern  . Not on file   Social History Narrative   Married to [employer] has kidney and pancrease transplant    ROS ALL NEGATIVE EXCEPT THOSE NOTED IN HPI  PE  General Appearance: well developed, well nourished in no acute distress HEENT: symmetrical face, PERRLA, good dentition  Neck: no JVD, thyromegaly, or adenopathy, trachea midline Chest: symmetric without deformity Cardiac: PMI non-displaced, RRR, normal S1, S2, no gallop or murmur Lung: clear to ausculation and percussion Vascular: all pulses full without bruits  Abdominal: nondistended, nontender, good bowel sounds, no HSM, no bruits Extremities: Mild splotchy dependent rubor, decreased capillary refill, I can barely feel pulses in the dorsalis pedis, tight chronic edematous changes. No ulcerations  Skin: normal color, no rashes Neuro: alert and oriented x 3, non-focal Pysch: normal affect  EKG  BMET    Component Value Date/Time   NA 138 06/28/2012 1216   K 4.4 06/28/2012 1216   CL 104 06/28/2012 1216   CO2 26 06/28/2012 1216   GLUCOSE 153* 06/28/2012 1216   BUN 27* 06/28/2012 1216   CREATININE 0.9 06/28/2012 1216   CALCIUM 9.4 06/28/2012 1216   GFRNONAA >60 05/09/2011 0500   GFRAA  Value: >60        The eGFR has been calculated using the MDRD equation. This calculation has not been validated in all clinical situations. eGFR's persistently <60 mL/min signify possible Chronic Kidney Disease. 05/09/2011 0500    Lipid Panel     Component Value Date/Time   CHOL 216* 06/28/2012 1216   TRIG 96.0 06/28/2012 1216   HDL 62.70 06/28/2012 1216   CHOLHDL 3 06/28/2012 1216   VLDL 19.2 06/28/2012 1216   LDLCALC 110* 09/23/2011 0839    CBC    Component Value Date/Time   WBC 5.9 06/28/2012 1216   RBC 3.62* 06/28/2012 1216   HGB 11.0* 06/28/2012 1216   HCT 32.7* 06/28/2012 1216   PLT 208.0 06/28/2012 1216   MCV 90.2 06/28/2012 1216   MCH 29.8 05/09/2011 0500   MCHC 33.7  06/28/2012 1216   RDW 17.4* 06/28/2012 1216   LYMPHSABS 1.5 06/28/2012 1216   MONOABS 0.7 06/28/2012 1216   EOSABS 0.3 06/28/2012 1216   BASOSABS 0.0 06/28/2012  1216      

## 2012-08-22 NOTE — Assessment & Plan Note (Signed)
Carotid Doppler stable. Continue secondary preventative therapy.

## 2012-08-22 NOTE — Assessment & Plan Note (Signed)
Stable. No change in secondary preventative therapy. 

## 2012-08-22 NOTE — Patient Instructions (Addendum)
Your physician wants you to follow-up in: 6 months.   You will receive a reminder letter in the mail two months in advance. If you don't receive a letter, please call our office to schedule the follow-up appointment.  Your physician has requested that you have a lower extremity arterial exercise duplex. During this test, exercise and ultrasound are used to evaluate arterial blood flow in the legs. Allow one hour for this exam. There are no restrictions or special instructions.

## 2012-08-25 ENCOUNTER — Ambulatory Visit (INDEPENDENT_AMBULATORY_CARE_PROVIDER_SITE_OTHER): Payer: Medicare Other

## 2012-08-25 DIAGNOSIS — Z23 Encounter for immunization: Secondary | ICD-10-CM

## 2012-08-30 ENCOUNTER — Other Ambulatory Visit: Payer: Self-pay | Admitting: Cardiology

## 2012-08-30 ENCOUNTER — Encounter (INDEPENDENT_AMBULATORY_CARE_PROVIDER_SITE_OTHER): Payer: Medicare Other

## 2012-08-30 ENCOUNTER — Other Ambulatory Visit: Payer: Self-pay | Admitting: *Deleted

## 2012-08-30 DIAGNOSIS — I739 Peripheral vascular disease, unspecified: Secondary | ICD-10-CM

## 2012-10-12 ENCOUNTER — Telehealth: Payer: Self-pay | Admitting: Cardiology

## 2012-10-12 MED ORDER — CARVEDILOL 6.25 MG PO TABS: 6.2500 mg | ORAL_TABLET | Freq: Two times a day (BID) | ORAL | Status: DC

## 2012-10-12 MED ORDER — PRAVASTATIN SODIUM 80 MG PO TABS: 80.0000 mg | ORAL_TABLET | Freq: Every day | ORAL | Status: DC

## 2012-10-12 NOTE — Telephone Encounter (Signed)
Pt needs carvedilol and pravastatin to [facility] 90 days supply

## 2012-10-12 NOTE — Telephone Encounter (Signed)
Pt is aware refills sent in to [facility] Mylo Red RN

## 2012-10-25 ENCOUNTER — Ambulatory Visit: Payer: Medicare Other | Admitting: Pulmonary Disease

## 2012-10-25 ENCOUNTER — Encounter: Payer: Self-pay | Admitting: *Deleted

## 2012-10-26 ENCOUNTER — Encounter: Payer: Self-pay | Admitting: Pulmonary Disease

## 2012-10-26 ENCOUNTER — Other Ambulatory Visit (INDEPENDENT_AMBULATORY_CARE_PROVIDER_SITE_OTHER): Payer: Medicare Other

## 2012-10-26 ENCOUNTER — Ambulatory Visit (INDEPENDENT_AMBULATORY_CARE_PROVIDER_SITE_OTHER): Payer: Medicare Other | Admitting: Pulmonary Disease

## 2012-10-26 VITALS — BP 138/60 | HR 63 | Temp 97.2°F | Ht 64.0 in | Wt 130.2 lb

## 2012-10-26 DIAGNOSIS — E7800 Pure hypercholesterolemia, unspecified: Secondary | ICD-10-CM

## 2012-10-26 DIAGNOSIS — E119 Type 2 diabetes mellitus without complications: Secondary | ICD-10-CM

## 2012-10-26 DIAGNOSIS — I679 Cerebrovascular disease, unspecified: Secondary | ICD-10-CM

## 2012-10-26 DIAGNOSIS — K449 Diaphragmatic hernia without obstruction or gangrene: Secondary | ICD-10-CM

## 2012-10-26 DIAGNOSIS — I2510 Atherosclerotic heart disease of native coronary artery without angina pectoris: Secondary | ICD-10-CM

## 2012-10-26 DIAGNOSIS — I10 Essential (primary) hypertension: Secondary | ICD-10-CM

## 2012-10-26 DIAGNOSIS — D638 Anemia in other chronic diseases classified elsewhere: Secondary | ICD-10-CM

## 2012-10-26 DIAGNOSIS — K219 Gastro-esophageal reflux disease without esophagitis: Secondary | ICD-10-CM

## 2012-10-26 DIAGNOSIS — E039 Hypothyroidism, unspecified: Secondary | ICD-10-CM

## 2012-10-26 DIAGNOSIS — M1990 Unspecified osteoarthritis, unspecified site: Secondary | ICD-10-CM

## 2012-10-26 DIAGNOSIS — E1149 Type 2 diabetes mellitus with other diabetic neurological complication: Secondary | ICD-10-CM

## 2012-10-26 DIAGNOSIS — E538 Deficiency of other specified B group vitamins: Secondary | ICD-10-CM

## 2012-10-26 DIAGNOSIS — E1142 Type 2 diabetes mellitus with diabetic polyneuropathy: Secondary | ICD-10-CM

## 2012-10-26 DIAGNOSIS — I5032 Chronic diastolic (congestive) heart failure: Secondary | ICD-10-CM

## 2012-10-26 DIAGNOSIS — I739 Peripheral vascular disease, unspecified: Secondary | ICD-10-CM

## 2012-10-26 DIAGNOSIS — M810 Age-related osteoporosis without current pathological fracture: Secondary | ICD-10-CM

## 2012-10-26 LAB — CBC WITH DIFFERENTIAL/PLATELET
Basophils Relative: 0.4 % (ref 0.0–3.0)
Eosinophils Relative: 2.9 % (ref 0.0–5.0)
HCT: 32.7 % — ABNORMAL LOW (ref 36.0–46.0)
Hemoglobin: 11 g/dL — ABNORMAL LOW (ref 12.0–15.0)
Lymphs Abs: 1.1 10*3/uL (ref 0.7–4.0)
MCV: 94.4 fl (ref 78.0–100.0)
Monocytes Absolute: 0.5 10*3/uL (ref 0.1–1.0)
Neutro Abs: 4.1 10*3/uL (ref 1.4–7.7)
RBC: 3.47 Mil/uL — ABNORMAL LOW (ref 3.87–5.11)
WBC: 5.9 10*3/uL (ref 4.5–10.5)

## 2012-10-26 LAB — BASIC METABOLIC PANEL
BUN: 37 mg/dL — ABNORMAL HIGH (ref 6–23)
Chloride: 101 mEq/L (ref 96–112)
Potassium: 4.6 mEq/L (ref 3.5–5.1)
Sodium: 134 mEq/L — ABNORMAL LOW (ref 135–145)

## 2012-10-26 LAB — VITAMIN B12: Vitamin B-12: 434 pg/mL (ref 211–911)

## 2012-10-26 LAB — HEMOGLOBIN A1C: Hgb A1c MFr Bld: 11.1 % — ABNORMAL HIGH (ref 4.6–6.5)

## 2012-10-26 MED ORDER — ROSUVASTATIN CALCIUM 20 MG PO TABS: 20.0000 mg | ORAL_TABLET | Freq: Every day | ORAL | Status: DC

## 2012-10-26 NOTE — Progress Notes (Signed)
Subjective:    Patient ID: Nicole [name], female    DOB: Dec 05, 1935, 77 y.o.   MRN: [id number]  HPI 77 y/o WF here for a follow up visit... she has mult med problems including IDDM w/ hx poor control;  HBP;  CAD followed by DrWall;  Cerebrovasc & Peripheral vasc dis;  Hyperlipidemia;  Hypothyroidism;  Large HH;  DJD w/ neck & LBP;  Osteoporosis & Vit D defic;  Hx Anemia...  ~  February 24, 2011:  6 month ROV & c/o left sided neck pain x 34mo started while making fudge in Dec> c/o tight leaders, popping & cracking, HA, etc; radiating to shoulder & up to head, no weakness/ numbness, etc;  She has been evaluated by DrBrooks, [facility], in the past w/ prev XRays she says (he wants to give shots, she declined- we don't have records);  She can't get MRIs due to screws in left hip;  We discussed rx w/ Pred Dosepak, Tramadol/ Tylenol. Add Robaxin 500mg Tid, rest/ heat, & she will call DrBrooks for f/u...    Ortho followed by DrOlin/ Ramos at [facility] for prev left fem neck fracture 5/09 w/ closed reduction & percut screw fixation... severe LBP & pain all over- given epid steroid shots w/ min relief; pain Rx w/ DCN in the past was too strong (sedated) therefore try Tramadol...    Cards followed by DrWall- HBP, diffuse vascular dis, r/o CAD, hyperlipidemia> stable w/ severe disease on Atenolol, Quinapril, Felodipine, Lasix;  BP today= 126/60 & denies CP, palpit, change in dyspnea, edema, etc... She is way too sedentary because of her Ortho problems... She last saw DrWall 11/11 & his note is reviewed- stable, no changes made...    Hyperchol>  She had FLP done by DrWall 1/12 on Crestor20mg /d w/ TChol 166, TG 122, HDL 43, LDL 99> he wanted to incr the Crestor to 40mg  but pt declined 7 preferred to keep it at 20mg /d & work on diet & wt reduction...    Her main problem is brittle DM> difficult contol on Lantus/ Humalog w/ freq adjustments & tried on Levemir but intol ("the shots burned & made me weak")... she  refused Endocrine referral... she has DM retinopathy w/ macular edema followed in the [facility] clinic... We have been trying to get her to slowly increase her Lantus by 2u increments titrating it to her FBS;  But she has refused, thwarted our attempts at better control by always returning her Lantus to 40u daily;  We discussed this strategy again today...  ~  August 31, 2011:  77mo ROV & she complains of freq hypoglycemia in early AM hours- she's been taking Lantus 40u and Humalog SS betw 7-10u at mealtimes; her accuchecks are all over the place & she will ret for FASTING blood work (see below)>>    Retinopathy> followed at [facility] (we don't have notes); she is blind in the right eye...    HBP> controlled on Coreg6.25Bid, Plendil10, Quinapril40, Lasix20; BP= 142/60 & even better at home she says...     CAD> She had extensive Cards eval by DrStuckey & DrWall; Caths 4/12 & 6/12 showed worrisome lesion in proxCFX- they decided on medical therapy (see below, & DrWalls notes are reviewed); she denies angina, palpit, ch in chr DOE, edema, etc...    ASPVD> CDopplers 7/12 stable, no cerebral ischemic symptoms; stable PAD w/o claud & known sm vessel dis distally, no LE lesions etc...    CHOL> on Lip40 & diet ==> FLP  shows TChol 186, TG 125, HDL 51, LDL 110    DM> as above, on Lantus/ Humalog w/ BS up & down at home ==> FBS=149 & A1c=9.4    Hypothy> stable on Levothy75...    GI> back on Protonix 40mg /d...    DJD>  Still having lots of back pain & followed by Mayford Knife; on Tramadol prn...    Anemia> she is off her prev iron therapy but still taking lots of Vits etc...  ~  February 29, 2012:  47mo ROV & she has mult somatic complaints> c/o sinusitis w/ drainage/ cough/ hoarse, drainage wakes her at night, no energy, incr SOB, etc;  Also notes legs hurt, tender, "my legs are hard", hurts to walk, more sedentary; DrWall started Neurontin 100mg  Tid for prob neuropathy related to her DM;  Husb passed away 12/12 w/  severe dementia, pneumonia, UTI, in hosp 3wks...    Retinopathy> followed at [facility] (we don't have notes); she is blind in the right eye...    HBP> controlled on Coreg6.25Bid, Plendil10, Quinapril40, Lasix20Bid; BP= 126/68 & similar at home she says...     CAD> She had extensive Cards eval by DrStuckey & DrWall; Caths 4/12 & 6/12 showed worrisome lesion in proxCFX- they decided on medical therapy (see below, & DrWall's notes are reviewed); she denies angina, palpit, ch in chr DOE, edema, etc...    ASPVD> CDopplers 7/12 stable, no cerebral ischemic symptoms; stable PAD w/o claud & known sm vessel dis distally, no LE lesions etc...    CHOL> on Prav40 now per DrWall, plus diet, not fasting today; FLP 9/12 on Lip40 showed TChol 186, TG 125, HDL 51, LDL 110    DM> on [id number] +SSI Humalog5-15Tid w/ BS up & down at home ==> FBS=83 & A1c=9.4    Hypothy> stable on Levothy75...    GI> back on Protonix 40mg /d...    DJD>  Still having lots of back pain & followed by Mayford Knife; on Tramadol prn & Neurontin 100Tid added by DrWall for leg pain, prob neuropathy...    Anemia> she is off her prev iron therapy but still taking lots of Vits etc; Hg=9.4, Fe=30, B12=215, SPE= ok... LABS 3/25:  Chems- ok x BS=83 A1c=9.4 BUN=31 Creat=1.3 BNP=265;  CBC- Hg=9.4 Fe=30(8%), B12=215;  SPE/IEP- neg no monoclonal prot & Ig's ok...  ~  June 28, 2012:  2mo ROV & Kelce has been seen by Cards x4 recently for Rx of her CHF> diuresed 23# of fluid betw checks by Karlyne Greenspan & DrWall, now back on Lasix40mg Qam w/ BNP=213 & wt= 138# (down 4# from 3/13 OV); her wt at home was 133# & she was instructed to incr the Lasix 40==>80 if her wt was over 132# (we reviewed these recs from Cards today)...     She is also c/o pain in her back & hips and wants f/u appt w/ DrOlin due to the pain & poss of THR; she will try TRAMADOL 50mg /d in the interim...    Finally she noted c/o hair loss 7 she has started on Biotin per her beautician; I have rec f/u  w/ her Derm... We reviewed prob list, meds, xrays and labs> see below>> LABS 7/13:  FLP- not at goal on Prav80 per DrWall;  Chems- ok x BS=153 A1c=11.2;  CBC- ok x Hg=11.0 Fe=68;  TSH=2.62;  BNP=213;  B12=481   ~  October 26, 2012:  2mo ROV & Hopelynn continues to have difficulty w/ her DM control noting LOW sugars betw 3-6AM  no matter when she takes he Lantus; reminded that the Humalog would more likely cause low sugars & she follows sliding scale but has been unable to improve her parameters sufficiently to reduce her A1c; previously managed by endocrine but she refused to continue w/ them; she has repeatedly refused offers to re-establish w/ DM specialist; Todays labs showed BS=364, A1c=11.1 & I have insisted that she see another Endocrinologist & we will refer to DrGherghe- pt would be interested in an insulin pump if this would be appropriate for her.    Retinopathy> followed at [facility] (we don't have notes); she is blind in the right eye...    HBP> controlled on Coreg6.25Bid, Plendil10, Quinapril40, Lasix40Qam; BP= 138/60 & similar at home she says...     CAD> She had extensive Cards eval by DrStuckey & DrWall; Caths 4/12 & 6/12 showed worrisome lesion in proxCFX- they decided on medical therapy (see below, & DrWall's notes are reviewed); she denies angina, palpit, ch in chr DOE, edema, etc...    ASPVD> CDopplers 8/13 stable (40-59% bilat ICA stenoses), no cerebral ischemic symptoms; she is very sedentary w/ claud symptoms if she walks too far, and she has progressive PAD as noted on her Dopplers per DrWall...    CHOL> on Prav80 now per DrWall + diet rx; last FLP 7/13 showed TChol 216, TG 96, HDL 63, LDL 130 & DrWall aware; she is requesting to return to CRES20- ok.    DM> on Lantus20-40 +SSI Humalog5-15Tid w/ BS up & down at home ==> Labs today are worse w/ BS=364, A1c=11.1 and we discussed the NEED to return to Endocrinology for their expertise, poss insulin pump; we will refer to  DrGherghe...    Hypothy> stable on Levothy75...    GI> back on Protonix 40mg Bid & she is asymptomatic...    DJD>  Still having lots of back pain & followed by [facility]; on Tramadol prn & off prev Neurontin tried for prob neuropathy...    Anemia> she still taking lots of Vits etc; plus Fe/ VitC and Hg=11.0, Fe=68 (24%sat)... We reviewed prob list, meds, xrays and labs> see below for updates >>  LABS 11/13:  Chems- ok x BS=364 A1c=11.1 BUN=37 Creat=1.3;  CBC- ok w/ Hg=11.0 Fe=68 (24%sat);  B12=434             Problem List:  RETINOPATHY (ICD-363.20) - followed at [facility] DrKarup (we don't have notes)... 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  HYPERTENSION (ICD-401.9) - now on COREG 6.25mg Bid, QUINAPRIL 40mg /d, FELODIPINE 10mg /d & LASIX 40mg /d...  ~  9/12: BP= 142/60 & 120-130 at home; she denies CP, palpit, change in dyspnea, PND, etc... ~  3/13: BP= 126/68 & similar at home; she is too sedentary but denies CP, palpit, edema... ~  7/13:  BP= 158/64 & didn't take meds today, reminded to take meds daily, same time each day; BUN=27, Creat=0.9, BNP=213... ~  11/13:  BP= 138/60 & she denies current CP, palpit, ch in SOB, edema...  CAD (ICD-414.00) &  CHRONIC DIASTOLIC CHF - on MEDS above... DrWall's notes are reviewed- denies recurrent angina, palpit, change in dyspnea or edema... we reviewed risk factor reduction strategy... ~  Prior stent in marginal branch of Circ in 1998 ~  Myoview 4/10 was negative- no scar or ischemia, EF= 73%... ~  labs 3/11 showed BUN=18, Crear=1.0, BNP=107, & EKG essent WNL.Marland Kitchen. ~  4/12:  Hosp w/ Diastolic CHF after Pred  Rx and fluid retention; diuresed & improved... ~  2DEcho 4/12 showed EF 65-70% with normal wall motion & grade 2 DD, mildMR, mild LAdil... ~  Cath 4/12 showed  50% midLAD, 90% calcified stenosis in proxCIRC, patent stent in second obtuse marginal, 70-80% proxRCA (non-dominent, sm vessel), EF=65%,  LVEDP=43mmHg. ~  [facility] 6/12 for re-cath by DrStuckey> He discussed results in detail with the patient, explaining that she "did not have evidence of USAP, with an FFR of 0.96> medical therapy was recommended". ~  She continues regular follow up visits w/ DrWall for management...  CEREBROVASCULAR DISEASE (ICD-437.9) - on ASA daily... no cerebral ischemic symptoms. ~  CDoppler 4/08 showing stable bilat carotid dis w/ 40-59% bilat stenoses... ~  repeat studies 4/09 showed mild smooth heterogeneous plaque w/ 40-59% bilat ICA stenoses. ~  f/u studies 4/10 showed mild to mod heterogen plaque bilat- 40-59% bilat stenoses, stable. ~  CDopplers 5/11 showed stable mild irreg plaque, 40-59% bilat ICAstenoses, no change. ~  CDopplers 7/12 showed stable mild heterogen plaque bilat w/ 40-59% bilat ICA stenoses; f/u planned 70yr... ~  CDopplers 8/13 showed stable mild heterogen plaque bilat w/ 40-59% bilat ICA stenoses, vertebral antegrade; f/u 55yr.  PERIPHERAL VASCULAR DISEASE (ICD-443.9) - abnormal ABI's and eval by DrDowney in 3/08 (& prev DrGupta 2004)... she had arteriogram w/ bilat dis but no indication for PTA... 20% bilat ostial renal lesions also noted... no complaints of claudication etc, ambulates w/ cane, BP controlled. ~  LE Vasc study 4/10 showed known diffuse bilat arterial occlusive dis- stable large vessel dis & progressive sm vessel dis w/ toe/ brachial indicies at risk for tissue loss... DrWall is following. ~  LE Vasc study 9/13 showed >50% right CFA stenosis extending into the prox PFA & SFA; interval occlusion of mid right SFA w/ reconstitutionvia collaterals in the distal SFA; right ABI is worse at 0.48; left ABI is sl worse at 0.65; right TBI is at risk for tissue loss at 0.19 (DrWall sent copies to [facility]).  HYPERCHOLESTEROLEMIA (ICD-272.0) - now on PRAVASTATIN 80mg /d per DrWall + FishOil & CoQ10... ~  FLP 3/08 showed TChol 158, Tg 119, HDL 43, LDL 92 ~  FLP 3/09 showed TChol 173, TG  151, HDL 41, LDL 102 ~  FLP 1/10 off med x 47mo showed TChol 349, TG 173, HDL 45, LDL 258... rec change Statin, but she prefers Cres20. ~  FLP 5/10 on Cres20 showed TChol 179, TG 113, HDL 52, LDL 104... rec> continue Cres20. ~  FLP 3/11 on Cres20 showed TChol 179, TG 125, HDL 50, LDL 104... She would not incr dose & wanted off Cres due to $$ & aching. ~  FLP 10/12 on Lip40 showed TChol 186, TG 125, HDL 51, LDL 110... She will not incr the dose. ~  DrWall switched her to PRAVASTATIN 40mg  taking 1.5 tab Qd ==> then 2 tabs daily. ~  FLP 7/13 on Prav80 per DrWall showed TChol 216, TG 96, HDL 63, LDL 130... Not at goals, copy sent to Cards. ~  11/13:  Pt is requesting to go back on CRESTOR 20mg /d, w/ follow up FLP on return.  DIABETES MELLITUS (ICD-250.00) - very brittle DM, insulin dependent and prev Rx by Endocrinology... she has retinopathy, neuropathy, and nephropathy w/ proteinuria noted (on ACE & Felodipine)... currently taking LANTUS 40 u daily, and HUMALOG 5-15 before each meal... we have discussed adjustments designed to improve her overall control without resulting hypoglycemia... ~  labs 9/08 showed BS= 171, HgA1c= 8.7 ~  labs 3/09 showed BS= 195, HgA1c= 8.6.Marland KitchenMarland Kitchen rec to slowly incr her insulin doses. ~  labs 6/09 showed BS= 287, HgA1c= 8.4.Marland KitchenMarland Kitchen offered Endocrine referral but she refuses, rec incr Lantus & Humalog. ~  labs 1/10 showed BS= 255, HgA1c= 9.4.Marland Kitchen. rec> incr Lantus and Humalog slowly. ~  labs 5/10 showed BS= 264, A1c= 8.5.Marland KitchenMarland Kitchen still needs incr insulin. ~  labs 9/10 showed BS= 220, A1c= 8.1 ~  labs 3/11 showed BS= 181, A1c= 9.0..Marland Kitchen she is reluctant to incr insulin, rec to switch to LEVEMIR 40u/d (she was intol). ~  labs 9/11 showed BS= 257, A1c= 9.1.Marland KitchenMarland Kitchen rec slowly incr Lantus by 2u increments until FBS is <150... ~  Labs 10/12 on Lantus40+SSHumalog showed BS= 149, A1c= 9.4.... She refuses to ret to Endocrine consultant. ~  Labs 3/13 showed BS= 83, A1c= 9.4... Once again asked to incr Lantus by  2u increments until FBS is <150... ~  Labs 7/13 on Lantus40+SSHumalog showed BS= 153, A1c= 11.2... She is admonished to incr the Lantus dose but states unable due to brittle DM numbers. ~  Labs 11/13 on Lantus20-40+SSHumalog5-15Tid showed BS=364 A1c=11.1 and I insisted on her return to Endocrine/ DM specialist for management, poss insulin pump.  HYPOTHYROIDISM (ICD-244.9) - on SYNTHROID 28mcg/d...   ~  labs 3/08 showed TSH = 2.0 ~  labs 3/09 showed TSH = 2.93 ~  labs 5/10 showed TSH = 3.46 ~  labs 3/11 showed TSH= 3.15 ~  Labs 4/12 showed TSH= 3.70 ~  Labs 3/13 showed TSH= 5.69... Reminded to take Qam on empty stomach... ~  Labs 7/13 showed TSH= 2.62  HIATAL HERNIA (ICD-553.3) - she is back on PROTONIX 40mg  Bid... ~  EGD 12/03 showed 5cmHH, tortuous & spastic esop, chr atrophic gastritis... Nexium recommended. ~  colonoscopy 12/03 was WNL x for decr rectal tone noted... incr fiber recommended.  DEGENERATIVE JOINT DISEASE (ICD-715.90) - "my back's in bad shape" per DrOlin & Shon Baton- nothing can be done surgically to help her... ~  5/09: fall w/ left hip fx- Rx DrOlin w/ closed reduction & percut screw fixation... she's been told she needs THR. ~  Back eval DrRamos 12/09 w/ epid steroid shot 1/10 without much benefit... ~  6/11:  she reports repeat epid shots by DrRamos helped somewhat & she will f/u w/ him... ~  9/11:  c/o hurting all over & TRAMADOL 50mg  Qid Prn tried... ~  7/13:  She is c/o back & hip pain; Tramadol refilled; asking to see DrOlin again... ~  11/13:  She reports recent eval from DrBednarz & he suspects Neuropathy; rec shoe inserts etc...  OSTEOPOROSIS (ICD-733.00), & VITAMIN D DEFICIENCY (ICD-268.9) - followed by GYN= DrTomblin w/ BMD 12/09 per pt ?results... not on bisphos Rx- she takes Calcium, Vits, & had Vit D defic w/ level= 8 per pt hx treated transiently w/ 50K weekly- now on 1000 u daily & level pending at DrTomblin's office.  ANEMIA OF CHRONIC DISEASE (ICD-285.29)  - on FeSO4, Vit C, Vit B12... ~  labs 9/08 showed Hg= 11.3  ~  labs 3/09 showed Hg= 11.2 ~  labs 5/10 showed Hg= 11.4 ~  labs 3/11 showed Hg= 11.6 ~  Labs 6/12 showed Hg= 9.6 ~  Labs 3/13 showed Hg= 9.4, Fe=30 (8%) ~  Labs 7/13 showed Hg= 11.0, Fe= 68 (23%sat), B12= 481...  HEALTH MAINTENANCE: ~  GI:  Followed by DrPatterson for GI but hasn't seen him in many yrs; known large HH on PPI Bid, prev EGD w/ dil  in 2003; ?when her last colon was done? ~  GYN:  She is followed by DrTomblin; seen 6/13 for GYN check up & pt reports doing well, everything neg... ~  Immuniz:  She gets the yearly Flu vaccine each fall; she received the Pneumovax in 2010;     Past Surgical History  Procedure Date  . Abdominal aortogram 07/27/2003    by Dr. Chales Abrahams  . Bilateral lower extremity angiogram 07/27/2003    by Dr. Chales Abrahams  . Selective angiography,right superficial femoral artery, right and left iliac arteries 07/27/2003    by Dr. Chales Abrahams  . Measurement of gradient right and left iliac arteries 07/27/2003    by Dr. Chales Abrahams  . Bilateral cataract sugery 2000 and 2002    by Dr. Cecilie Kicks  . Repair left hip fracture 04/2008    by Dr. Charlann Boxer    Outpatient Encounter Prescriptions as of 10/26/2012  Medication Sig Dispense Refill  . acetaminophen (TYLENOL) 500 MG tablet Take 500 mg by mouth every 6 (six) hours as needed.        Marland Kitchen aspirin 81 MG tablet Take 81 mg by mouth daily.        Marland Kitchen b complex vitamins tablet Take 1 tablet by mouth daily.      . carbonyl iron (FEOSOL) 45 MG TABS Take 45 mg by mouth daily.      . carvedilol (COREG) 6.25 MG tablet Take 1 tablet (6.25 mg total) by mouth 2 (two) times daily with a meal.  180 tablet  3  . Cholecalciferol (VITAMIN D) 1000 UNITS capsule Take 1,000 Units by mouth daily.        . felodipine (PLENDIL) 10 MG 24 hr tablet Take 1 tablet (10 mg total) by mouth daily.  90 tablet  1  . furosemide (LASIX) 40 MG tablet Take 1 tablet (40 mg total) by mouth daily.  180 tablet  3  . insulin  glargine (LANTUS) 100 UNIT/ML injection Take 20-40 units in the skin daily      . insulin lispro (HUMALOG) 100 UNIT/ML injection Inject 5 to 15 units subcutaneously as needed before meals  10 mL  4  . levothyroxine (SYNTHROID) 75 MCG tablet Take 1 tablet (75 mcg total) by mouth daily.  90 tablet  3  . multivitamin (THERAGRAN) per tablet Take 1 tablet by mouth daily.        . nitroGLYCERIN (NITROSTAT) 0.4 MG SL tablet Place 1 tablet (0.4 mg total) under the tongue every 5 (five) minutes as needed. May repeat up to 3 doses  25 tablet  3  . pantoprazole (PROTONIX) 40 MG tablet Take 1 tablet (40 mg total) by mouth 2 (two) times daily. 30 minutes before breakfast and dinner  180 tablet  3  . pravastatin (PRAVACHOL) 80 MG tablet Take 1 tablet (80 mg total) by mouth daily.  90 tablet  3  . quinapril (ACCUPRIL) 40 MG tablet Take 1 tablet (40 mg total) by mouth daily.  90 tablet  1  . traMADol (ULTRAM) 50 MG tablet Take 1 tablet (50 mg total) by mouth every 6 (six) hours as needed.  100 tablet  5  . vitamin B-12 (CYANOCOBALAMIN) 1000 MCG tablet Take 1,000 mcg by mouth daily.      . vitamin C (ASCORBIC ACID) 500 MG tablet Take 500 mg by mouth daily. To take with iron tablet daily       . [DISCONTINUED] insulin glargine (LANTUS) 100 UNIT/ML injection Inject 30 Units into the skin at  bedtime.      . [DISCONTINUED] Biotin 1000 MCG tablet Take 1,000 mcg by mouth daily.        Allergies  Allergen Reactions  . Ibuprofen     REACTION: edema---thyroid problems  . Prednisone     Causes severe fluid retention    Current Medications, Allergies, Past Medical History, Past Surgical History, Family History, and Social History were reviewed in [employer] record.   Review of Systems   Constitutional:  Denies F/C/S, anorexia, unexpected weight change. HEENT:  Occas HAs; but no visual changes, earache, nasal symptoms, sore throat, hoarseness. Resp:  No cough, sputum, hemoptysis; no ch DOE,  & no tightness or wheezing. Cardio:  No CP, palpit, orthopnea, edema. GI:  Denies N/V/D/C or blood in stool; no reflux, abd pain, distention, or gas. GU:  No dysuria, freq, urgency, hematuria, or flank pain. MS:  Mod joint pains, w/ mild swelling, tenderness, & decr ROM... Neuro:  No tremors, seizures, dizziness, syncope; sl weakness & multifactorial gait abn. Skin:  No suspicious lesions or skin rash. Heme:  No adenopathy, bruising, bleeding. Psyche: Denies confusion, sleep disturbance, hallucinations; mod anxiety caring for husb (Alz).   Objective:   Physical Exam   WD, WN, 77 y/o WF in NAD; she is chr ill appearing... Vital Signs:  Reviewed...BP= 126/60 General:  Alert & oriented; pleasant & cooperative... HEENT:  [place]/AT, EOM-wnl, PERRLA, EACs-clear, TMs-wnl, NOSE-clear, THROAT-clear & wnl. Neck:  Sl tender w/ decr ROM; no JVD; normal carotid impulses w/o bruits; no thyromegaly or nodules palpated; no lymphadenopathy. Chest:  Clear to P & A; without wheezes/ rales/ or rhonchi heard... Heart:  Regular Rhythm; norm S1 & S2, & gr 1/6 SEM w/o rubs or gallops detected... Abdomen:  Soft & nontender; normal bowel sounds; no organomegaly or masses palpated... Ext:  Decr ROM; +heberden's etc & mod arthritic changes; no varicose veins, +venous insuffic, tr edema;  Pulses intact w/o bruits... Neuro:  CNs II-XII intact; motor testing normal; sensory testing normal; abn gait & balance only fair... Derm:  No lesions noted; no rash etc... Lymph:  No cervical, supraclavicular, axillary, or inguinal adenopathy palpated...   RADIOLOGY DATA:  Reviewed in the EPIC EMR & discussed w/ the patient...  LABORATORY DATA:  Reviewed in the EPIC EMR & discussed w/ the patient...   Assessment & Plan:    DM> as above, on Lantus/ Humalog w/ BS up & down at home ==> FBS=364 & A1c=11.1; she has repeatedly refused Endocrine referral- this time I have insisted on DM specialist eval & consideration of poss insulin  pump and she has agreed- refer to DrGherghe...  Retinopathy> followed at [facility] (we don't have notes); she is blind in the right eye...     HBP> controlled on Coreg6.25Bid, Plendil10, Quinapril40, Lasix40; continue current meds...     CAD> She had extensive Cards eval by DrStuckey & DrWall; Caths 4/12 & 6/12 showed worrisome lesion in proxCFX- they decided on medical therapy (see above, & DrWalls notes are reviewed); she denies angina, palpit, ch in chr DOE, edema, etc; She had DD last yr and vigorous diuresis by Cards w/ 23# fluid reduction & improved...     ASPVD> CDopplers 8/13 stable, no cerebral ischemic symptoms; known severe PAD w/ interval worsening on Dopplers and managed by DrWall...     CHOL> on Prav80 but not at goals and she wants to change back to CRESTOR 20mg /d...        Hypothy> stable on Levothy75.Marland KitchenMarland Kitchen  GI> back on Protonix 40mg  Bid...Marland KitchenMarland KitchenMarland Kitchen     DJD>  Still having lots of back pain & followed by Mayford Knife; on Tramadol prn...     Anemia> Improved back on prev iron therapy; still taking lots of Vits etc; rec to continue FeSO4, Vit C & Vit B12 1074mcg/d...   Patient's Medications  New Prescriptions   ROSUVASTATIN (CRESTOR) 20 MG TABLET    Take 1 tablet (20 mg total) by mouth daily.  Previous Medications   ACETAMINOPHEN (TYLENOL) 500 MG TABLET    Take 500 mg by mouth every 6 (six) hours as needed.     ASPIRIN 81 MG TABLET    Take 81 mg by mouth daily.     B COMPLEX VITAMINS TABLET    Take 1 tablet by mouth daily.   CARBONYL IRON (FEOSOL) 45 MG TABS    Take 45 mg by mouth daily.   CARVEDILOL (COREG) 6.25 MG TABLET    Take 1 tablet (6.25 mg total) by mouth 2 (two) times daily with a meal.   CHOLECALCIFEROL (VITAMIN D) 1000 UNITS CAPSULE    Take 1,000 Units by mouth daily.     FELODIPINE (PLENDIL) 10 MG 24 HR TABLET    Take 1 tablet (10 mg total) by mouth daily.   FUROSEMIDE (LASIX) 40 MG TABLET    Take 1 tablet (40 mg total) by mouth daily.   INSULIN LISPRO (HUMALOG)  100 UNIT/ML INJECTION    Inject 5 to 15 units subcutaneously as needed before meals   LEVOTHYROXINE (SYNTHROID) 75 MCG TABLET    Take 1 tablet (75 mcg total) by mouth daily.   MULTIVITAMIN (THERAGRAN) PER TABLET    Take 1 tablet by mouth daily.     NITROGLYCERIN (NITROSTAT) 0.4 MG SL TABLET    Place 1 tablet (0.4 mg total) under the tongue every 5 (five) minutes as needed. May repeat up to 3 doses   PANTOPRAZOLE (PROTONIX) 40 MG TABLET    Take 1 tablet (40 mg total) by mouth 2 (two) times daily. 30 minutes before breakfast and dinner   QUINAPRIL (ACCUPRIL) 40 MG TABLET    Take 1 tablet (40 mg total) by mouth daily.   TRAMADOL (ULTRAM) 50 MG TABLET    Take 1 tablet (50 mg total) by mouth every 6 (six) hours as needed.   VITAMIN B-12 (CYANOCOBALAMIN) 1000 MCG TABLET    Take 1,000 mcg by mouth daily.   VITAMIN C (ASCORBIC ACID) 500 MG TABLET    Take 500 mg by mouth daily. To take with iron tablet daily   Modified Medications   Modified Medication Previous Medication   INSULIN GLARGINE (LANTUS) 100 UNIT/ML INJECTION insulin glargine (LANTUS) 100 UNIT/ML injection      Take 20-40 units in the skin daily    Inject 30 Units into the skin at bedtime.  Discontinued Medications   BIOTIN 1000 MCG TABLET    Take 1,000 mcg by mouth daily.   PRAVASTATIN (PRAVACHOL) 80 MG TABLET    Take 1 tablet (80 mg total) by mouth daily.

## 2012-10-26 NOTE — Patient Instructions (Addendum)
Today we updated your med list in our EPIC system...    We decided to change your Pravastatin80 to CRESTOR20...  Today we did your follow up blood work...    We will contact you w/ your results when avail...  We ill inquire about a diabetic specialist who could evaluate you for an insulin pump...  Call for any questions...   Let's plan a follow up visit in 4-6 months.Marland KitchenMarland Kitchen

## 2012-10-28 ENCOUNTER — Other Ambulatory Visit: Payer: Self-pay | Admitting: Pulmonary Disease

## 2012-10-28 DIAGNOSIS — E119 Type 2 diabetes mellitus without complications: Secondary | ICD-10-CM

## 2012-11-07 ENCOUNTER — Ambulatory Visit: Payer: Medicare Other | Admitting: Internal Medicine

## 2012-11-15 ENCOUNTER — Ambulatory Visit: Payer: Medicare Other | Admitting: Internal Medicine

## 2012-11-16 ENCOUNTER — Ambulatory Visit (INDEPENDENT_AMBULATORY_CARE_PROVIDER_SITE_OTHER): Payer: Medicare Other | Admitting: Internal Medicine

## 2012-11-16 ENCOUNTER — Encounter: Payer: Self-pay | Admitting: Internal Medicine

## 2012-11-16 VITALS — BP 180/60 | HR 60 | Temp 97.6°F | Resp 16 | Ht 61.0 in | Wt 126.0 lb

## 2012-11-16 DIAGNOSIS — E119 Type 2 diabetes mellitus without complications: Secondary | ICD-10-CM

## 2012-11-16 MED ORDER — INSULIN LISPRO 100 UNIT/ML ~~LOC~~ SOLN: SUBCUTANEOUS | Status: DC

## 2012-11-16 MED ORDER — INSULIN GLARGINE 100 UNIT/ML ~~LOC~~ SOLN: SUBCUTANEOUS | Status: DC

## 2012-11-16 NOTE — Patient Instructions (Addendum)
Please decrease your Lantus to 18 units every morning Please take 2 units of Lispro with each meal, and add the following: - if sugars 150-250, add 1 unit - 251-300: add 2 units - 301-350: add 3 units - 351-400: add 4 units, etc.  Please call me on Friday with your sugars. Also, call me if your sugars stay too high or too low. Our telephone nr. is [phone]. Please write all your sugars down and bring the log at our next appointment in a month.  Basic Rules for Patients with Type I Diabetes Mellitus  1. The American Diabetes Association (ADA) recommended targets: - fasting sugar <130 - after meal sugar <180 - HbA1C <7%  2. Engage in ?150 min moderate exercise per week  3. Make sure you have ?8h of sleep every night as this helps both blood sugars and your weight.  4. Always keep a sugar log (not only record in your meter) and bring it to all appointments with [place].  5. "15-15 rule" for hypoglycemia: if sugars are low, take 15 g of carbs** ("fast sugar" - e.g. 4 glucose tablets, 4 oz orange juice), wait 15 min, then check sugars again. If still <80, repeat. Continue  until your sugars >80, then eat a normal meal.   6. If you are on a pump, make sure the basal daily insulin dose is approximately equal (not larger) to the daily insulin you get from boluses, otherwise you are at risk for hypoglycemia.  7. Check sugar before driving. If <100, correct, and only start driving if sugars rise ?098. Check sugar every hour when on a long drive.  8. Check sugar before exercising. If <100, correct, and only start exercising if sugars rise ?100. Check sugar every hour when on a long exercise routine and 1h after you finished exercising.   If >250, check urine for ketones. If you have moderate-large ketones in urine, do not start exercise. Hydrate yourself with clear liquids and correct the high sugar. Recheck sugars and ketones before attempting to exercise.  Be aware that you might need less  insulin when exercising.  *intense, short, exercise bursts can increase your sugars, but  *less intense, longer (>1h), exercise routines can decrease your sugars.  If you are on a pump, you might need to decrease your basal rate by 10% or more (or even disconnect your pump) while you exercise to prevent low sugars. Do not disconnect your pump by more than 3 hours at a time! You also might need to decrease your insulin bolus for the meal prior to your exercise time by 20% or more.  9. Make sure you have a MedAlert bracelet or pendant mentioning "Type I Diabetes Mellitus". If you have a prior episode of severe hypoglycemia or hypoglycemia unawareness, it should also mention this.  10. Please do not walk barefoot. Inspect your feet for sores/cuts and let us know if you have them.   **E.g. of "fast carbs":   first choice (15 g):  1 tube glucose gel, GlucoPouch 15, 2 oz glucose liquid   second choice (15-16 g):  3 or 4 glucose tablets (best taken  with water), 15 Dextrose Bits chewable   third choice (15-20 g):   cup fruit juice,  cup regular soda, 1 cup skim milk,  1 cup sports drink   fourth choice (15-20 g):  1 small tube [place] gel (not frosting), 2 tbsp raisins, 1 tbsp table sugar,  candy, jelly beans, gum drops - check package for  carb amount   (adapted from: Juluis Rainier. "Insulin therapy and hypoglycemia" Endocrinol Metab Clin N Am 2012, 41: 57-87)

## 2012-11-16 NOTE — Progress Notes (Addendum)
Subjective:     Patient ID: Nicole [name], female   DOB: 22-Aug-1935, 77 y.o.   MRN: [id number]  HPI [name] is a pleasant 77 y/o woman referred by her PCP, Dr. Kriste Basque, for management of DM1, uncontrolled, with complications (retinopathy, blind in R eye; peripheral neuropathy; CKD; CVD - CAD and diastolic CHF, PAD). She discussed with PCP the possibility of starting an insulin pump.  She has had DM since she was 77 y/o. It was never well controlled, she mentions. Last 4 HbA1C values: Component     Latest Ref Rng 09/23/2011 02/29/2012 06/28/2012 10/26/2012  Hemoglobin A1C     4.6 - 6.5 % 9.4 (H) 9.4 (H) 11.2 (H) 11.1 (H)  She was seen by endocrinology in the past but now Dr. Kriste Basque had to try hard to convince her to come and see endocrinology again.   She is now on a regimen of: - Lantus 24 units every am - PCP has tried to have her titrate her Lantus but she did not start the regimen as she was afraid sugars might drop. She tells me that regardless of how much Lantus she takes, her sugars are low between 3-6 am usually.  - Lispro 2-7 units bid AC, not taking it before dinner; takes more if eats more, e.g 7 units for Thanksgiving lunch. If she is low in am, she skips am Lispro then  She checks her sugars >6 x a day.  - am: 70-160. Lowest sugars: 40's.  - before lunch: 200s usually - before dinner: 200's - bedtime: 200s She has a low once every 2 days. She drinks OJ - 1 glass, and adds sugar to it. She also has candy around, even near her bed. Has hypoglycemia awareness if <90. Highest sugars: 400s, fairly frequently. Usually, when she goes to bed she is in the 200s, but if lower, she eats something.   Her last eye exam was: 2 weeks ago. She got the flu vaccine this season.   She lost almost 40 lbs since she broke her hip. She can hardly walk, she walks with a cane and is very painful. She has neuropathy and pain R>L.  I reviewed her chart including office notes, telephone notes, labs,  and available scanned records.  Past Medical History  Diagnosis Date  . Retinopathy   . Hypertension   . CHF (congestive heart failure)     has a normal EF per echo 03/2011 & 5/2013with diastolic dysfunction  . CAD (coronary artery disease)     Prior PCI in 1998 x 2; s/p cath in 2001, negative Myoview in 2008 & 2011, s/p cath in June 2012 with calcified disease of the proximal LCX with normal flow reserve. She has been managed medically due to her other morbidities  . Cerebrovascular disease   . Peripheral vascular disease     prior angiography showing RSFA stenosis, left anterior tibial and left posterior tibial stenoses  . Hypercholesterolemia   . Diabetes mellitus     insulin dependent  . Hypothyroidism   . Hiatal hernia   . Degenerative joint disease   . Osteoporosis   . Vitamin D deficiency   . Anemia of chronic disease   . Renal insufficiency    Past Surgical History  Procedure Date  . Abdominal aortogram 07/27/2003    by Dr. Chales Abrahams  . Bilateral lower extremity angiogram 07/27/2003    by Dr. Chales Abrahams  . Selective angiography,right superficial femoral artery, right and left iliac arteries 07/27/2003  by Dr. Chales Abrahams  . Measurement of gradient right and left iliac arteries 07/27/2003    by Dr. Chales Abrahams  . Bilateral cataract sugery 2000 and 2002    by Dr. Cecilie Kicks  . Repair left hip fracture 04/2008    by Dr. Charlann Boxer   History   Social History  . Marital Status: Married    Spouse Name: Nicole [name]    Number of Children: 2  . Years of Education: N/A   Occupational History  . retired    Social History Main Topics  . Smoking status: Former Smoker    Types: Cigarettes    Quit date: 12/07/1974  . Smokeless tobacco: Never Used     Comment: pt started smoking in 1956  . Alcohol Use: No  . Drug Use: No  . Sexually Active: No   Other Topics Concern  . Not on file   Social History Narrative   Widowed for 1 year - 2 children---son has kidney and pancrease transplant    Medication Sig  . acetaminophen (TYLENOL) 500 MG tablet Take 500 mg by mouth every 6 (six) hours as needed.    Marland Kitchen aspirin 81 MG tablet Take 81 mg by mouth daily.    Marland Kitchen b complex vitamins tablet Take 1 tablet by mouth daily.  . carbonyl iron (FEOSOL) 45 MG TABS Take 45 mg by mouth daily.  . carvedilol (COREG) 6.25 MG tablet Take 1 tablet (6.25 mg total) by mouth 2 (two) times daily with a meal.  . Cholecalciferol (VITAMIN D) 1000 UNITS capsule Take 1,000 Units by mouth daily.    . felodipine (PLENDIL) 10 MG 24 hr tablet Take 1 tablet (10 mg total) by mouth daily.  . furosemide (LASIX) 40 MG tablet Take 1 tablet (40 mg total) by mouth daily.  Marland Kitchen levothyroxine (SYNTHROID) 75 MCG tablet Take 1 tablet (75 mcg total) by mouth daily.  . multivitamin (THERAGRAN) per tablet Take 1 tablet by mouth daily.    . nitroGLYCERIN (NITROSTAT) 0.4 MG SL tablet Place 1 tablet (0.4 mg total) under the tongue every 5 (five) minutes as needed. May repeat up to 3 doses  . pantoprazole (PROTONIX) 40 MG tablet Take 1 tablet (40 mg total) by mouth 2 (two) times daily. 30 minutes before breakfast and dinner  . quinapril (ACCUPRIL) 40 MG tablet Take 1 tablet (40 mg total) by mouth daily.  . rosuvastatin (CRESTOR) 20 MG tablet Take 1 tablet (20 mg total) by mouth daily.  . traMADol (ULTRAM) 50 MG tablet Take 1 tablet (50 mg total) by mouth every 6 (six) hours as needed.  . vitamin B-12 (CYANOCOBALAMIN) 1000 MCG tablet Take 1,000 mcg by mouth daily.  . vitamin C (ASCORBIC ACID) 500 MG tablet Take 500 mg by mouth daily. To take with iron tablet daily   . [DISCONTINUED] insulin glargine (LANTUS) 100 UNIT/ML injection Take 20-40 units in the skin daily  . [DISCONTINUED] insulin lispro (HUMALOG) 100 UNIT/ML injection Inject 5 to 15 units subcutaneously as needed before meals   Allergies  Allergen Reactions  . Ibuprofen     REACTION: edema---thyroid problems  . Prednisone     Causes severe fluid retention   FH:  noncontributory  Review of Systems Constitutional: no weight gain, but has wt loss (see HPI) loss, no fatigue, no increased thirst/urination. Eyes: no blurry vision, no xerophthalmia ENT: no sore throat, no nodules palpated in throat, no dysphagia/odynophagia, no hoarseness Cardiovascular: no CP/SOB/palpitations/leg swelling Respiratory: no cough/SOB Gastrointestinal: no N/V/D/C usually, D for  last 3 days, now better Musculoskeletal: muscle/joint aches - "all over" Skin: no rashes Neurological: no tremors, has numbness/tingling, no dizziness Psychiatric: no depression/anxiety, grieves for husband who died 1 year ago     Objective:   Physical Exam BP 180/60  Pulse 60  Temp 97.6 F (36.4 C) (Oral)  Resp 16  Ht 5\' 1"  (1.549 m)  Wt 126 lb (57.153 kg)  BMI 23.81 kg/m2  SpO2 98% Wt Readings from Last 3 Encounters:  11/16/12 126 lb (57.153 kg)  10/26/12 130 lb 3.2 oz (59.058 kg)  08/22/12 136 lb (61.689 kg)   Constitutional:  Normal weight, in NAD; walks with a cane Eyes: PERRLA, EOMI ENT: moist mucous membranes, no thyromegaly, no cervical lymphadenopathy Cardiovascular: RRR, No MRG Respiratory: CTA B Gastrointestinal: abdomen soft, NT, ND, BS+ Musculoskeletal: no deformities, strength intact in all 4 Skin: moist, warm, no rashes Neurological: no tremor with outstretched hands, DTR normal in all 4 Foot exam: no rashes, onychodystrophy bilat. big toes, nonpalpable dorsalis pedis and post. Tibial pulses bilaterally, decreased 4-point sensation on soles bilaterally    Assessment:     1. DM1, uncontrolled, with complications: - retinopathy, blind in R eye; she reports dx of macular edema - [facility] Francene Castle [facility] - peripheral neuropathy - CKD - CVD - CAD and CHF diastolic, nl EF - Dr. Daleen Squibb - PAD 2. HTN     Plan:     1. DM1  - pt with wide fluctuations in CBGs from 40-500s, with most lows in am. She often skips Lispro insulin to avoid dropping her sugars too much. -  Because of  her dropping CBGs in am, will decrease Lantus to 18 units (decrease by 25%). The fact that she gets her lows at night/early am is concerning, especially since she is living alone. The main goal for her will  be eliminate the lows. - To avoid her high CBGs during the day, will also start her on mealtime insulin, with ALL 3 meals - 2 units of Lispro with each meal, and  - will add SSI as follows: - if sugars 150-250, add 1 unit - 251-300: add 2 units - 301-350: add 3 units - 351-400: add 4 units, etc. - We practiced with several examples and she seems to have understood the concept - she appears to overcorrects her lows so we discussed (and given handout) how to limit her carbohydrate when low ("the 15-15 rule"). For further instructions, please also see the "Patient Instructions" section. - I gave her sugar logs and advised her on how to fill them  - I advised her to call me in 2 days with her sugars, or if she has very highs or any lows until then - we did discuss about starting her on a pump and I explained what it entails - she tells me that she is not interested in this and would like to continue with basal-bolus regimen for now - no labs today - I will see her back in 1 mo with her log 2. HTN - BP high today, she did not take Lasix last night but took it this am Advised to take Lasix as she gets home rather than at dinnertime

## 2012-11-18 ENCOUNTER — Telehealth: Payer: Self-pay | Admitting: *Deleted

## 2012-11-18 NOTE — Telephone Encounter (Signed)
Called pt back >> sugars improved, but still has highs/lows:  12/11: before dinner 120 > 2U Lispro > bedtime: 108 12/12: before breakfast 65,            before dinner 185 > No Lispro > bedtime: 344 12/13: before breakfast 169  Since she is still dropping by ~50% from bedtime to am, will decrease Lantus further to 15 units. Will also have her have only 1 unit of Lispro before dinner and change her dinner SSI as follows:  For sugars; - 251-300: add 1 unit  - 301-350: add 2 units  - 351-400: add 3 units, etc I realize this is a somewhat complicated regimen for her but she seemed to have understood what to do and we will likely need to adjust this as we go. She agreed to call me again next week to tell me how she is doing.

## 2012-11-18 NOTE — Telephone Encounter (Signed)
PATIENT CALLED TO LET YOU KNOW AFTER 2 DAYS OF CHANGE IN INSULIN MEDICINES SHE HAS BEEN DOING GOOD. SHE STATES SHE DID NOT WAKE UP THIS AM WITH A LOW READING LIKE YESTERDAY BECAUSE SHE THINKS IT WAS LOW YESTERDAY AM BECAUSE SHE TOOK HER HUMALOG TO LATE AND WILL NOT DO THAT ANYMORE. HER CALL BACK NUMBER IS [phone]

## 2012-11-23 ENCOUNTER — Telehealth: Payer: Self-pay | Admitting: Pulmonary Disease

## 2012-11-23 NOTE — Telephone Encounter (Signed)
Called and spoke with pt and she stated that the form that was filled out by SN for the [employer] for the inserts for her shoes.  She will need notes from SN and needs these faxed to [employer]  [phone].  Will not be able to get her inserts until this OV note is faxed. Pt is aware that ov note will be faxed to number given.

## 2012-12-14 ENCOUNTER — Ambulatory Visit: Payer: Medicare Other | Admitting: Internal Medicine

## 2012-12-21 ENCOUNTER — Ambulatory Visit: Payer: Medicare Other | Admitting: Internal Medicine

## 2012-12-27 ENCOUNTER — Ambulatory Visit: Payer: Medicare Other | Admitting: Internal Medicine

## 2013-01-04 ENCOUNTER — Ambulatory Visit: Payer: Medicare Other | Admitting: Internal Medicine

## 2013-01-16 ENCOUNTER — Telehealth: Payer: Self-pay | Admitting: Pulmonary Disease

## 2013-01-16 MED ORDER — AMOXICILLIN-POT CLAVULANATE 875-125 MG PO TABS: 1.0000 | ORAL_TABLET | Freq: Two times a day (BID) | ORAL | Status: DC

## 2013-01-16 NOTE — Telephone Encounter (Signed)
Spoke with pt She is c/o head congestion, prod cough with green/yellow/brown sputum, green nasal d/c x 4 wks She states has been taking clariton to help with no relief Offered ov with TP and she refused Please advise thanks! Last ov 10/26/12 Next ov 04/26/13 Allergies  Allergen Reactions  . Ibuprofen     REACTION: edema---thyroid problems  . Prednisone     Causes severe fluid retention

## 2013-01-16 NOTE — Telephone Encounter (Signed)
Per SN-give Augmentin 875 mg #14 take po bid no refills, add Align Probiotic OTC 1 tablet daily, add OTC Mucinex, and nasal saline.

## 2013-01-16 NOTE — Telephone Encounter (Signed)
Spoke w/ pt and is aware of SN recs. She voiced her understanding. RX has been sent to the pharmacy. Nothing further was needed

## 2013-01-26 ENCOUNTER — Other Ambulatory Visit: Payer: Self-pay | Admitting: Pulmonary Disease

## 2013-01-26 MED ORDER — FELODIPINE ER 10 MG PO TB24: 10.0000 mg | ORAL_TABLET | Freq: Every day | ORAL | Status: DC

## 2013-01-26 MED ORDER — QUINAPRIL HCL 40 MG PO TABS: 40.0000 mg | ORAL_TABLET | Freq: Every day | ORAL | Status: DC

## 2013-01-26 MED ORDER — LEVOTHYROXINE SODIUM 75 MCG PO TABS: 75.0000 ug | ORAL_TABLET | Freq: Every day | ORAL | Status: DC

## 2013-01-26 NOTE — Telephone Encounter (Signed)
Received faxed refill requests for: Levothyroxine QD Felodipine 10mg  QD Quinapril 40mg  QD  Last ov 11.20.13 w/ SN Refills sent

## 2013-02-08 ENCOUNTER — Telehealth: Payer: Self-pay | Admitting: Pulmonary Disease

## 2013-02-08 ENCOUNTER — Telehealth: Payer: Self-pay | Admitting: Cardiology

## 2013-02-08 NOTE — Telephone Encounter (Signed)
New problem    Would like to be seen today     C/O CHF is messing up  .

## 2013-02-08 NOTE — Telephone Encounter (Signed)
Called spoke with patient who c/o increased SOB and chest congestion Pt stated that she also called Dr Vern Claude office regarding these issues with also an increase in her weight Pt is scheduled to see Tereso Newcomer tomorrow morning 3.6.14 @ 9:10a Pt declined ov this afternoon w/ MW as Matilde Sprang is out of the office this week Advised pt to call if anything further is needed Will sign off

## 2013-02-08 NOTE — Telephone Encounter (Signed)
Pt calls today b/c she has noted increase in weight  since late January early February.  She has also experienced chest pain x3 in the past month each time being relieved with 1 SL NTG. At times it has also been "hard to breathe". Denies ankle edema but does have belly swelling & tightness   Last time she took NTG was last Friday or Saturday according to pt  With the weight gain pt also increased her furosemide 40mg  daily to 80mg  every morning.  She has been doing this for over a month. States that it does not seem to be working Reassurance given. Pt denies any chest pain today..  Baseline weight is around 129.   Her current weight today is 143. appt made with Scott for 02/09/13 at 9:10am  Mylo Red RN

## 2013-02-09 ENCOUNTER — Encounter: Payer: Self-pay | Admitting: Physician Assistant

## 2013-02-09 ENCOUNTER — Ambulatory Visit (INDEPENDENT_AMBULATORY_CARE_PROVIDER_SITE_OTHER): Payer: Medicare Other | Admitting: Physician Assistant

## 2013-02-09 ENCOUNTER — Encounter (HOSPITAL_COMMUNITY): Payer: Self-pay | Admitting: General Practice

## 2013-02-09 ENCOUNTER — Inpatient Hospital Stay (HOSPITAL_COMMUNITY)
Admission: AD | Admit: 2013-02-09 | Discharge: 2013-02-15 | DRG: 293 | Disposition: A | Discharge status: Home Health | Payer: Medicare Other | Source: Ambulatory Visit | Attending: Cardiology | Admitting: Cardiology

## 2013-02-09 VITALS — BP 151/52 | HR 59 | Ht 61.0 in | Wt 144.8 lb

## 2013-02-09 DIAGNOSIS — M1990 Unspecified osteoarthritis, unspecified site: Secondary | ICD-10-CM | POA: Diagnosis present

## 2013-02-09 DIAGNOSIS — E7800 Pure hypercholesterolemia, unspecified: Secondary | ICD-10-CM | POA: Diagnosis present

## 2013-02-09 DIAGNOSIS — R600 Localized edema: Secondary | ICD-10-CM

## 2013-02-09 DIAGNOSIS — I739 Peripheral vascular disease, unspecified: Secondary | ICD-10-CM | POA: Diagnosis present

## 2013-02-09 DIAGNOSIS — R079 Chest pain, unspecified: Secondary | ICD-10-CM

## 2013-02-09 DIAGNOSIS — Z794 Long term (current) use of insulin: Secondary | ICD-10-CM

## 2013-02-09 DIAGNOSIS — D638 Anemia in other chronic diseases classified elsewhere: Secondary | ICD-10-CM | POA: Diagnosis present

## 2013-02-09 DIAGNOSIS — E785 Hyperlipidemia, unspecified: Secondary | ICD-10-CM | POA: Diagnosis present

## 2013-02-09 DIAGNOSIS — E559 Vitamin D deficiency, unspecified: Secondary | ICD-10-CM | POA: Diagnosis present

## 2013-02-09 DIAGNOSIS — E039 Hypothyroidism, unspecified: Secondary | ICD-10-CM | POA: Diagnosis present

## 2013-02-09 DIAGNOSIS — Z87891 Personal history of nicotine dependence: Secondary | ICD-10-CM

## 2013-02-09 DIAGNOSIS — I2510 Atherosclerotic heart disease of native coronary artery without angina pectoris: Secondary | ICD-10-CM | POA: Diagnosis present

## 2013-02-09 DIAGNOSIS — I5033 Acute on chronic diastolic (congestive) heart failure: Secondary | ICD-10-CM

## 2013-02-09 DIAGNOSIS — R3989 Other symptoms and signs involving the genitourinary system: Secondary | ICD-10-CM | POA: Diagnosis present

## 2013-02-09 DIAGNOSIS — I6529 Occlusion and stenosis of unspecified carotid artery: Secondary | ICD-10-CM | POA: Diagnosis present

## 2013-02-09 DIAGNOSIS — M810 Age-related osteoporosis without current pathological fracture: Secondary | ICD-10-CM | POA: Diagnosis present

## 2013-02-09 DIAGNOSIS — K219 Gastro-esophageal reflux disease without esophagitis: Secondary | ICD-10-CM | POA: Diagnosis present

## 2013-02-09 DIAGNOSIS — I509 Heart failure, unspecified: Secondary | ICD-10-CM | POA: Diagnosis present

## 2013-02-09 DIAGNOSIS — I10 Essential (primary) hypertension: Secondary | ICD-10-CM | POA: Diagnosis present

## 2013-02-09 DIAGNOSIS — E119 Type 2 diabetes mellitus without complications: Secondary | ICD-10-CM | POA: Diagnosis present

## 2013-02-09 DIAGNOSIS — Z79899 Other long term (current) drug therapy: Secondary | ICD-10-CM

## 2013-02-09 DIAGNOSIS — I5032 Chronic diastolic (congestive) heart failure: Secondary | ICD-10-CM

## 2013-02-09 HISTORY — DX: Chronic diastolic (congestive) heart failure: I50.32

## 2013-02-09 LAB — CBC WITH DIFFERENTIAL/PLATELET
Basophils Absolute: 0 10*3/uL (ref 0.0–0.1)
Eosinophils Relative: 5 % (ref 0–5)
Lymphocytes Relative: 17 % (ref 12–46)
Lymphs Abs: 1.2 10*3/uL (ref 0.7–4.0)
MCV: 87.8 fL (ref 78.0–100.0)
Neutro Abs: 4.8 10*3/uL (ref 1.7–7.7)
Neutrophils Relative %: 68 % (ref 43–77)
Platelets: 263 10*3/uL (ref 150–400)
RBC: 3.29 MIL/uL — ABNORMAL LOW (ref 3.87–5.11)
RDW: 12.8 % (ref 11.5–15.5)
WBC: 7.1 10*3/uL (ref 4.0–10.5)

## 2013-02-09 LAB — GLUCOSE, CAPILLARY
Glucose-Capillary: 239 mg/dL — ABNORMAL HIGH (ref 70–99)
Glucose-Capillary: 141 mg/dL — ABNORMAL HIGH (ref 70–99)

## 2013-02-09 LAB — COMPREHENSIVE METABOLIC PANEL
ALT: 12 U/L (ref 0–35)
AST: 17 U/L (ref 0–37)
Alkaline Phosphatase: 123 U/L — ABNORMAL HIGH (ref 39–117)
CO2: 24 mEq/L (ref 19–32)
Calcium: 9.2 mg/dL (ref 8.4–10.5)
Chloride: 102 mEq/L (ref 96–112)
GFR calc Af Amer: 45 mL/min — ABNORMAL LOW (ref >90)
GFR calc non Af Amer: 38 mL/min — ABNORMAL LOW (ref >90)
Glucose, Bld: 156 mg/dL — ABNORMAL HIGH (ref 70–99)
Potassium: 4 mEq/L (ref 3.5–5.1)
Sodium: 138 mEq/L (ref 135–145)
Total Bilirubin: 0.6 mg/dL (ref 0.3–1.2)

## 2013-02-09 LAB — PROTIME-INR: INR: 0.98 (ref 0.00–1.49)

## 2013-02-09 LAB — TROPONIN I
Troponin I: <0.3 ng/mL (ref <0.30)
Troponin I: <0.3 ng/mL (ref <0.30)

## 2013-02-09 MED ORDER — ASPIRIN 81 MG PO CHEW
81.0000 mg | CHEWABLE_TABLET | Freq: Every day | ORAL | Status: DC
  Administered 2013-02-10 – 2013-02-15 (×6): 81 mg via ORAL
  Filled 2013-02-09 (×7): qty 1

## 2013-02-09 MED ORDER — ASPIRIN 81 MG PO TABS: 81.0000 mg | ORAL_TABLET | Freq: Every day | ORAL | Status: DC

## 2013-02-09 MED ORDER — PANTOPRAZOLE SODIUM 40 MG PO TBEC
40.0000 mg | DELAYED_RELEASE_TABLET | Freq: Two times a day (BID) | ORAL | Status: DC
  Administered 2013-02-09 – 2013-02-15 (×12): 40 mg via ORAL
  Filled 2013-02-09 (×13): qty 1

## 2013-02-09 MED ORDER — FUROSEMIDE 10 MG/ML IJ SOLN
60.0000 mg | Freq: Two times a day (BID) | INTRAMUSCULAR | Status: DC
  Administered 2013-02-10 – 2013-02-11 (×2): 60 mg via INTRAVENOUS
  Filled 2013-02-09 (×5): qty 6

## 2013-02-09 MED ORDER — ADULT MULTIVITAMIN W/MINERALS CH
1.0000 | ORAL_TABLET | Freq: Every day | ORAL | Status: DC
  Administered 2013-02-09 – 2013-02-15 (×7): 1 via ORAL
  Filled 2013-02-09 (×7): qty 1

## 2013-02-09 MED ORDER — INSULIN GLARGINE 100 UNIT/ML ~~LOC~~ SOLN
18.0000 [IU] | Freq: Every day | SUBCUTANEOUS | Status: DC
  Filled 2013-02-09: qty 0.18

## 2013-02-09 MED ORDER — CARBONYL IRON 45 MG PO TABS
45.0000 mg | ORAL_TABLET | Freq: Every day | ORAL | Status: DC
  Filled 2013-02-09: qty 1

## 2013-02-09 MED ORDER — VITAMIN D 1000 UNITS PO CAPS: 1000.0000 [IU] | ORAL_CAPSULE | Freq: Every day | ORAL | Status: DC

## 2013-02-09 MED ORDER — SODIUM CHLORIDE 0.9 % IV SOLN: 250.0000 mL | INTRAVENOUS | Status: DC | PRN

## 2013-02-09 MED ORDER — QUINAPRIL HCL 10 MG PO TABS
40.0000 mg | ORAL_TABLET | Freq: Every day | ORAL | Status: DC
  Administered 2013-02-10 – 2013-02-15 (×6): 40 mg via ORAL
  Filled 2013-02-09 (×7): qty 4

## 2013-02-09 MED ORDER — FERROUS SULFATE 325 (65 FE) MG PO TABS
325.0000 mg | ORAL_TABLET | Freq: Every day | ORAL | Status: DC
  Administered 2013-02-10 – 2013-02-11 (×2): 325 mg via ORAL
  Filled 2013-02-09 (×8): qty 1

## 2013-02-09 MED ORDER — ACETAMINOPHEN 500 MG PO TABS
500.0000 mg | ORAL_TABLET | Freq: Four times a day (QID) | ORAL | Status: DC | PRN
  Filled 2013-02-09: qty 1

## 2013-02-09 MED ORDER — FUROSEMIDE 10 MG/ML IJ SOLN
INTRAMUSCULAR | Status: AC
  Administered 2013-02-09: 60 mg via INTRAVENOUS
  Filled 2013-02-09: qty 8

## 2013-02-09 MED ORDER — ACETAMINOPHEN 325 MG PO TABS
650.0000 mg | ORAL_TABLET | ORAL | Status: DC | PRN
  Administered 2013-02-12: 650 mg via ORAL
  Filled 2013-02-09: qty 2

## 2013-02-09 MED ORDER — ENOXAPARIN SODIUM 40 MG/0.4ML ~~LOC~~ SOLN
40.0000 mg | SUBCUTANEOUS | Status: DC
  Administered 2013-02-09 – 2013-02-10 (×2): 40 mg via SUBCUTANEOUS
  Filled 2013-02-09 (×2): qty 0.4

## 2013-02-09 MED ORDER — INSULIN ASPART 100 UNIT/ML ~~LOC~~ SOLN
0.0000 [IU] | Freq: Every day | SUBCUTANEOUS | Status: DC
  Administered 2013-02-14: 2 [IU] via SUBCUTANEOUS

## 2013-02-09 MED ORDER — SIMVASTATIN 40 MG PO TABS
40.0000 mg | ORAL_TABLET | Freq: Every day | ORAL | Status: DC
  Administered 2013-02-09 – 2013-02-14 (×6): 40 mg via ORAL
  Filled 2013-02-09 (×7): qty 1

## 2013-02-09 MED ORDER — VITAMIN B-12 1000 MCG PO TABS
1000.0000 ug | ORAL_TABLET | Freq: Every day | ORAL | Status: DC
  Administered 2013-02-09 – 2013-02-15 (×7): 1000 ug via ORAL
  Filled 2013-02-09 (×7): qty 1

## 2013-02-09 MED ORDER — TRAMADOL HCL 50 MG PO TABS
50.0000 mg | ORAL_TABLET | Freq: Four times a day (QID) | ORAL | Status: DC | PRN
  Administered 2013-02-09: 50 mg via ORAL
  Filled 2013-02-09: qty 1

## 2013-02-09 MED ORDER — ONDANSETRON HCL 4 MG/2ML IJ SOLN: 4.0000 mg | Freq: Four times a day (QID) | INTRAMUSCULAR | Status: DC | PRN

## 2013-02-09 MED ORDER — MULTIVITAMINS PO TABS
1.0000 | ORAL_TABLET | Freq: Every day | ORAL | Status: DC
Start: 2013-02-09 — End: 2013-02-09

## 2013-02-09 MED ORDER — POTASSIUM CHLORIDE CRYS ER 20 MEQ PO TBCR
20.0000 meq | EXTENDED_RELEASE_TABLET | Freq: Two times a day (BID) | ORAL | Status: DC
  Administered 2013-02-09 – 2013-02-15 (×11): 20 meq via ORAL
  Filled 2013-02-09 (×17): qty 1

## 2013-02-09 MED ORDER — CARVEDILOL 6.25 MG PO TABS
6.2500 mg | ORAL_TABLET | Freq: Two times a day (BID) | ORAL | Status: DC
  Administered 2013-02-09 – 2013-02-15 (×12): 6.25 mg via ORAL
  Filled 2013-02-09 (×14): qty 1

## 2013-02-09 MED ORDER — VITAMIN C 500 MG PO TABS
500.0000 mg | ORAL_TABLET | Freq: Every day | ORAL | Status: DC
  Administered 2013-02-09 – 2013-02-15 (×7): 500 mg via ORAL
  Filled 2013-02-09 (×7): qty 1

## 2013-02-09 MED ORDER — NITROGLYCERIN 0.4 MG SL SUBL
0.4000 mg | SUBLINGUAL_TABLET | SUBLINGUAL | Status: DC | PRN
  Filled 2013-02-09: qty 25

## 2013-02-09 MED ORDER — VITAMIN D3 25 MCG (1000 UNIT) PO TABS
1000.0000 [IU] | ORAL_TABLET | Freq: Every day | ORAL | Status: DC
  Administered 2013-02-10 – 2013-02-15 (×6): 1000 [IU] via ORAL
  Filled 2013-02-09 (×7): qty 1

## 2013-02-09 MED ORDER — INSULIN ASPART 100 UNIT/ML ~~LOC~~ SOLN
0.0000 [IU] | Freq: Three times a day (TID) | SUBCUTANEOUS | Status: DC
  Administered 2013-02-09: 4 [IU] via SUBCUTANEOUS
  Administered 2013-02-10: 7 [IU] via SUBCUTANEOUS
  Administered 2013-02-11: 2 [IU] via SUBCUTANEOUS
  Administered 2013-02-12 – 2013-02-13 (×3): 3 [IU] via SUBCUTANEOUS

## 2013-02-09 MED ORDER — LEVOTHYROXINE SODIUM 75 MCG PO TABS
75.0000 ug | ORAL_TABLET | Freq: Every day | ORAL | Status: DC
  Administered 2013-02-10 – 2013-02-15 (×6): 75 ug via ORAL
  Filled 2013-02-09 (×8): qty 1

## 2013-02-09 MED ORDER — SODIUM CHLORIDE 0.9 % IJ SOLN
3.0000 mL | Freq: Two times a day (BID) | INTRAMUSCULAR | Status: DC
  Administered 2013-02-10 – 2013-02-15 (×10): 3 mL via INTRAVENOUS

## 2013-02-09 MED ORDER — SODIUM CHLORIDE 0.9 % IJ SOLN: 3.0000 mL | INTRAMUSCULAR | Status: DC | PRN

## 2013-02-09 MED ORDER — FELODIPINE ER 10 MG PO TB24
10.0000 mg | ORAL_TABLET | Freq: Every day | ORAL | Status: DC
  Administered 2013-02-10 – 2013-02-15 (×6): 10 mg via ORAL
  Filled 2013-02-09 (×7): qty 1

## 2013-02-09 NOTE — Care Management Note (Addendum)
    Page 1 of 2   02/15/2013     11:37:34 AM   CARE MANAGEMENT NOTE 02/15/2013  Patient:  Nicole [name], Nicole [name]   Account Number:  [id number]  Date Initiated:  02/09/2013  Documentation initiated by:  GRAVES-BIGELOW,BRENDA  Subjective/Objective Assessment:   Pt admitted with SOB. Pt lives alone and has family support. Pt states she still drives and she has RW and cane at home for DME.     Action/Plan:   CM will continue to monitor for disposition needs. Pt may benefit from [facility] at d/c.   Anticipated DC Date:  02/13/2013   Anticipated DC Plan:  HOME W HOME HEALTH SERVICES      DC Planning Services  CM consult      [facility] Choice  HOME HEALTH   Choice offered to / List presented to:  C-1 Patient        HH arranged  HH-1 RN  HH-10 DISEASE MANAGEMENT      HH agency  [facility]   Status of service:  Completed, signed off Medicare Important Message given?   (If response is "NO", the following Medicare IM given date fields will be blank) Date Medicare IM given:   Date Additional Medicare IM given:    Discharge Disposition:  HOME W HOME HEALTH SERVICES  Per UR Regulation:  Reviewed for med. necessity/level of care/duration of stay  If discussed at Long Length of Stay Meetings, dates discussed:    Comments:  02-15-13 [address] Nicole [name], [place] [phone] CM did f/u again with pt and she is willing to have a RN visit  for CHF Management. CM did make referral with [facility] and SOC to begin within 24-48 hours post d/c. CM did call [facility] and he will f/u with pt before d/c today. No further needs from CM at this time.  02-13-13 [name], [place] [phone] CM did speak to pt in reference to [facility] services RN and pt is refusing services at this time. CM did discuss nutrition with pt d/t she states she cooks with salt. RN CM asked staff RN to provide pt with low [name] diet options. CM will continue to monitor for disposition  needs.

## 2013-02-09 NOTE — H&P (Signed)
Admission History and Physical  Date:  02/09/2013   ID:  Nicole [name], DOB 1935-09-16, MRN [id number]  PCP:  Michele Mcalpine, MD  Primary Cardiologist:  Dr. Valera Castle     History of Present Illness: Nicole [name] is a 78 y.o. female who returns for evaluation of chest pain and shortness of breath.  She has a history of CAD, status post prior stenting of the circumflex, PAD, diastolic CHF, carotid stenosis, HTN, HL, DM2, hypothyroidism. LHC 4/12: LAD 50-60%, proximal circumflex 90-95%, mid AV groove circumflex 50%, proximal OM 2 stents patent, RCA proximal-mid 70-80%, EF 65%. FFR was 0.97. This was not hemodynamically significant and medical therapy was recommended. Echocardiogram 5/13: EF 65-70%, MAC, mild MR, PASP 34. Last seen by Dr. Daleen Squibb 9/13.  Over the last 2 months, she has noted increased dyspnea with exertion as well as chest pain with exertion. She has taken nitroglycerin for her chest discomfort which helps. She notes orthopnea as well as PND and increased LE edema. She has noted a 10 pound weight gain. She has increased Lasix on her own to 80 mg daily. Some days she takes 80 mg in the morning and 40 mg in the afternoon. She has had no relief. She denies any radiating chest pain to her arm or jaw or associated nausea or diaphoresis. She denies syncope.  Labs (11/13):  K 4.6, creatinine 1.3, Hgb 11  Wt Readings from Last 3 Encounters:  02/09/13 144 lb 12.8 oz (65.681 kg)  11/16/12 126 lb (57.153 kg)  10/26/12 130 lb 3.2 oz (59.058 kg)     Past Medical History  Diagnosis Date  . Retinopathy   . Hypertension   . CHF (congestive heart failure)     has a normal EF per echo 03/2011 & 5/2013with diastolic dysfunction  . CAD (coronary artery disease)     Prior PCI in 1998 x 2; s/p cath in 2001, negative Myoview in 2008 & 2011, s/p cath in June 2012 with calcified disease of the proximal LCX with normal flow reserve. She has been managed medically due to her other morbidities  .  Cerebrovascular disease   . Peripheral vascular disease     prior angiography showing RSFA stenosis, left anterior tibial and left posterior tibial stenoses  . Hypercholesterolemia   . Diabetes mellitus     insulin dependent  . Hypothyroidism   . Hiatal hernia   . Degenerative joint disease   . Osteoporosis   . Vitamin D deficiency   . Anemia of chronic disease   . Renal insufficiency     Current Outpatient Prescriptions  Medication Sig Dispense Refill  . acetaminophen (TYLENOL) 500 MG tablet Take 500 mg by mouth every 6 (six) hours as needed.        Marland Kitchen amoxicillin-clavulanate (AUGMENTIN) 875-125 MG per tablet Take 1 tablet by mouth 2 (two) times daily.  14 tablet  0  . aspirin 81 MG tablet Take 81 mg by mouth daily.        Marland Kitchen b complex vitamins tablet Take 1 tablet by mouth daily.      . carbonyl iron (FEOSOL) 45 MG TABS Take 45 mg by mouth daily.      . carvedilol (COREG) 6.25 MG tablet Take 1 tablet (6.25 mg total) by mouth 2 (two) times daily with a meal.  180 tablet  3  . Cholecalciferol (VITAMIN D) 1000 UNITS capsule Take 1,000 Units by mouth daily.        . felodipine (PLENDIL)  10 MG 24 hr tablet Take 1 tablet (10 mg total) by mouth daily.  90 tablet  3  . furosemide (LASIX) 40 MG tablet Take 1 tablet (40 mg total) by mouth daily.  180 tablet  3  . insulin glargine (LANTUS) 100 UNIT/ML injection Take 18 units in the skin daily  10 mL  3  . insulin lispro (HUMALOG) 100 UNIT/ML injection Inject subcutaneously as instructed before all 3 meals - total approx 15 units a day  10 mL  4  . levothyroxine (SYNTHROID) 75 MCG tablet Take 1 tablet (75 mcg total) by mouth daily.  90 tablet  3  . multivitamin (THERAGRAN) per tablet Take 1 tablet by mouth daily.        . nitroGLYCERIN (NITROSTAT) 0.4 MG SL tablet Place 1 tablet (0.4 mg total) under the tongue every 5 (five) minutes as needed. May repeat up to 3 doses  25 tablet  3  . pantoprazole (PROTONIX) 40 MG tablet Take 1 tablet (40 mg  total) by mouth 2 (two) times daily. 30 minutes before breakfast and dinner  180 tablet  3  . pravastatin (PRAVACHOL) 80 MG tablet Take 80 mg by mouth daily.       . quinapril (ACCUPRIL) 40 MG tablet Take 1 tablet (40 mg total) by mouth daily.  90 tablet  3  . traMADol (ULTRAM) 50 MG tablet Take 1 tablet (50 mg total) by mouth every 6 (six) hours as needed.  100 tablet  5  . vitamin B-12 (CYANOCOBALAMIN) 1000 MCG tablet Take 1,000 mcg by mouth daily.      . vitamin C (ASCORBIC ACID) 500 MG tablet Take 500 mg by mouth daily. To take with iron tablet daily        No current facility-administered medications for this visit.    Allergies:    Allergies  Allergen Reactions  . Ibuprofen     REACTION: edema---thyroid problems  . Prednisone     Causes severe fluid retention    Social History:  The patient  reports that she quit smoking about 38 years ago. Her smoking use included Cigarettes. She smoked 0.00 packs per day. She has never used smokeless tobacco. She reports that she does not drink alcohol or use illicit drugs.   Family History:  The patient's family history includes Heart disease in her mother.   ROS:  Please see the history of present illness.   She has a nonproductive cough. She denies fevers or chills. She denies melena or hematochezia.   All other systems reviewed and negative.   PHYSICAL EXAM: VS:  BP 151/52  Pulse 59  Ht 5\' 1"  (1.549 m)  Wt 144 lb 12.8 oz (65.681 kg)  BMI 27.37 kg/m2  SpO2 96% Well nourished, well developed, in no acute distress HEENT: normal Neck: + JVD Cardiac:  normal S1, S2; RRR; no murmur; no S3 Lungs:  clear to auscultation bilaterally, no wheezing, rhonchi or rales Abd: distended, nontender, no hepatomegaly Ext: 1+ bilateral LE edema up to the thighs Skin: warm and dry Neuro:  CNs 2-12 intact, no focal abnormalities noted  EKG:  Sinus bradycardia, HR 59, normal axis, nonspecific ST-T wave abnormality, no change since 5/13     ASSESSMENT  AND PLAN:  1. Acute on Chronic Diastolic CHF:  She is volume overloaded.  She is also having a significant amount of CP.  Question if this is related to volume overload vs concomitant angina.  She has borderline lesion in the CFX  in 2012 with neg FFR.  She has had little response to increased Lasix at home.  Recommend admission to the hospital for IV diuresis.  Discussed with Dr.  Shawnie Pons who agreed.  Will place her on Lasix 60 mg IV bid to start.  Add K+ 20 mEq bid.  Check BMET daily.  Close I/Os. Last echo done in 04/2012 with normal EF. 2. Chest Pain:  Check serial markers.  May need to consider cardiac cath prior to discharge if CP does not improve with diuresis.  Continue ASA, statin.  DVT dose Lovenox.  Change to full dose if NZs +. 3. Coronary Artery Disease:  Proceed with admission as noted.  Consider cath if + NZs or continued CP. 4. Hypertension:  Continue current regimen. 5. Diabetes:  Continue current regimen.  Cover with SSI. 6. Disposition:  Proceed with admission to the hospital today from the office.   Signed, Tereso Newcomer, PA-C  10:09 AM 02/09/2013     Patient seen and evaluated with Mr.  Alben Spittle.  She presents with what appears to be volume overload, with shortness of breath, as well as increasing chest tightness.  She has not responded well at home to furosemide.  She has developed lower extremity edema.  We will provide IV diuresis and reassess once she is hospitalized.  Patient agreeable to this approach.

## 2013-02-09 NOTE — Progress Notes (Signed)
UR Completed Brenda Graves-Bigelow, RN,BSN [phone]  

## 2013-02-09 NOTE — Telephone Encounter (Signed)
Patient admitted from office. Tereso Newcomer, PA-C  4:59 PM 02/09/2013

## 2013-02-09 NOTE — Progress Notes (Signed)
[address], [address] [place], [place]  [address] Phone: [phone], Fax:  [phone]  Date:  02/09/2013   ID:  Nicole [name] 24-Nov-1935, MRN [id number]  PCP:  Michele Mcalpine, MD  Primary Cardiologist:  Dr. Valera Castle     History of Present Illness: Nicole [name] is a 78 y.o. female who returns for evaluation of chest pain and shortness of breath.  She has a history of CAD, status post prior stenting of the circumflex, PAD, diastolic CHF, carotid stenosis, HTN, HL, DM2, hypothyroidism. LHC 4/12: LAD 50-60%, proximal circumflex 90-95%, mid AV groove circumflex 50%, proximal OM 2 stents patent, RCA proximal-mid 70-80%, EF 65%. FFR was 0.97. This was not hemodynamically significant and medical therapy was recommended. Echocardiogram 5/13: EF 65-70%, MAC, mild MR, PASP 34. Last seen by Dr. Daleen Squibb 9/13.  Over the last 2 months, she has noted increased dyspnea with exertion as well as chest pain with exertion. She has taken nitroglycerin for her chest discomfort which helps. She notes orthopnea as well as PND and increased LE edema. She has noted a 10 pound weight gain. She has increased Lasix on her own to 80 mg daily. Some days she takes 80 mg in the morning and 40 mg in the afternoon. She has had no relief. She denies any radiating chest pain to her arm or jaw or associated nausea or diaphoresis. She denies syncope.  Labs (11/13):  K 4.6, creatinine 1.3, Hgb 11  Wt Readings from Last 3 Encounters:  02/09/13 144 lb 12.8 oz (65.681 kg)  11/16/12 126 lb (57.153 kg)  10/26/12 130 lb 3.2 oz (59.058 kg)     Past Medical History  Diagnosis Date  . Retinopathy   . Hypertension   . CHF (congestive heart failure)     has a normal EF per echo 03/2011 & 5/2013with diastolic dysfunction  . CAD (coronary artery disease)     Prior PCI in 1998 x 2; s/p cath in 2001, negative Myoview in 2008 & 2011, s/p cath in June 2012 with calcified disease of the proximal LCX with normal  flow reserve. She has been managed medically due to her other morbidities  . Cerebrovascular disease   . Peripheral vascular disease     prior angiography showing RSFA stenosis, left anterior tibial and left posterior tibial stenoses  . Hypercholesterolemia   . Diabetes mellitus     insulin dependent  . Hypothyroidism   . Hiatal hernia   . Degenerative joint disease   . Osteoporosis   . Vitamin D deficiency   . Anemia of chronic disease   . Renal insufficiency     Current Outpatient Prescriptions  Medication Sig Dispense Refill  . acetaminophen (TYLENOL) 500 MG tablet Take 500 mg by mouth every 6 (six) hours as needed.        Marland Kitchen amoxicillin-clavulanate (AUGMENTIN) 875-125 MG per tablet Take 1 tablet by mouth 2 (two) times daily.  14 tablet  0  . aspirin 81 MG tablet Take 81 mg by mouth daily.        Marland Kitchen b complex vitamins tablet Take 1 tablet by mouth daily.      . carbonyl iron (FEOSOL) 45 MG TABS Take 45 mg by mouth daily.      . carvedilol (COREG) 6.25 MG tablet Take 1 tablet (6.25 mg total) by mouth 2 (two) times daily with a meal.  180 tablet  3  . Cholecalciferol (VITAMIN D) 1000 UNITS capsule Take 1,000 Units  by mouth daily.        . felodipine (PLENDIL) 10 MG 24 hr tablet Take 1 tablet (10 mg total) by mouth daily.  90 tablet  3  . furosemide (LASIX) 40 MG tablet Take 1 tablet (40 mg total) by mouth daily.  180 tablet  3  . insulin glargine (LANTUS) 100 UNIT/ML injection Take 18 units in the skin daily  10 mL  3  . insulin lispro (HUMALOG) 100 UNIT/ML injection Inject subcutaneously as instructed before all 3 meals - total approx 15 units a day  10 mL  4  . levothyroxine (SYNTHROID) 75 MCG tablet Take 1 tablet (75 mcg total) by mouth daily.  90 tablet  3  . multivitamin (THERAGRAN) per tablet Take 1 tablet by mouth daily.        . nitroGLYCERIN (NITROSTAT) 0.4 MG SL tablet Place 1 tablet (0.4 mg total) under the tongue every 5 (five) minutes as needed. May repeat up to 3 doses  25  tablet  3  . pantoprazole (PROTONIX) 40 MG tablet Take 1 tablet (40 mg total) by mouth 2 (two) times daily. 30 minutes before breakfast and dinner  180 tablet  3  . pravastatin (PRAVACHOL) 80 MG tablet Take 80 mg by mouth daily.       . quinapril (ACCUPRIL) 40 MG tablet Take 1 tablet (40 mg total) by mouth daily.  90 tablet  3  . traMADol (ULTRAM) 50 MG tablet Take 1 tablet (50 mg total) by mouth every 6 (six) hours as needed.  100 tablet  5  . vitamin B-12 (CYANOCOBALAMIN) 1000 MCG tablet Take 1,000 mcg by mouth daily.      . vitamin C (ASCORBIC ACID) 500 MG tablet Take 500 mg by mouth daily. To take with iron tablet daily        No current facility-administered medications for this visit.    Allergies:    Allergies  Allergen Reactions  . Ibuprofen     REACTION: edema---thyroid problems  . Prednisone     Causes severe fluid retention    Social History:  The patient  reports that she quit smoking about 38 years ago. Her smoking use included Cigarettes. She smoked 0.00 packs per day. She has never used smokeless tobacco. She reports that she does not drink alcohol or use illicit drugs.   Family History:  The patient's family history includes Heart disease in her mother.   ROS:  Please see the history of present illness.   She has a nonproductive cough. She denies fevers or chills. She denies melena or hematochezia.   All other systems reviewed and negative.   PHYSICAL EXAM: VS:  BP 151/52  Pulse 59  Ht 5\' 1"  (1.549 m)  Wt 144 lb 12.8 oz (65.681 kg)  BMI 27.37 kg/m2  SpO2 96% Well nourished, well developed, in no acute distress HEENT: normal Neck: + JVD Cardiac:  normal S1, S2; RRR; no murmur; no S3 Lungs:  clear to auscultation bilaterally, no wheezing, rhonchi or rales Abd: distended, nontender, no hepatomegaly Ext: 1+ bilateral LE edema up to the thighs Skin: warm and dry Neuro:  CNs 2-12 intact, no focal abnormalities noted  EKG:  Sinus bradycardia, HR 59, normal axis,  nonspecific ST-T wave abnormality, no change since 5/13     ASSESSMENT AND PLAN:  1. Acute on Chronic Diastolic CHF:  She is volume overloaded.  She is also having a significant amount of CP.  Question if this is related to  volume overload vs concomitant angina.  She has borderline lesion in the CFX in 2012 with neg FFR.  She has had little response to increased Lasix at home.  Recommend admission to the hospital for IV diuresis.  Discussed with Dr.  Shawnie Pons who agreed.  Will place her on Lasix 60 mg IV bid.  Add K+ 20 mEq bid.  Check BMET daily.  Close I/Os. 2. Chest Pain:  Check serial markers.  May need to consider cardiac cath prior to discharge if CP does not improve with diuresis.  Continue ASA, statin.  DVT dose Lovenox.  Change to full dose if NZs +. 3. Coronary Artery Disease:  Proceed with admission as noted.  Consider cath if + NZs or continued CP. 4. Hypertension:  Continue current regimen. 5. Diabetes:  Continue current regimen.  Cover with SSI. 6. Disposition:  Proceed with admission to the hospital today from the office.   SignedTereso Newcomer, PA-C  10:09 AM 02/09/2013

## 2013-02-10 ENCOUNTER — Inpatient Hospital Stay (HOSPITAL_COMMUNITY): Payer: Medicare Other

## 2013-02-10 DIAGNOSIS — I5033 Acute on chronic diastolic (congestive) heart failure: Principal | ICD-10-CM

## 2013-02-10 LAB — GLUCOSE, CAPILLARY: Glucose-Capillary: 108 mg/dL — ABNORMAL HIGH (ref 70–99)

## 2013-02-10 LAB — BASIC METABOLIC PANEL
CO2: 27 mEq/L (ref 19–32)
Chloride: 104 mEq/L (ref 96–112)
GFR calc Af Amer: 38 mL/min — ABNORMAL LOW (ref >90)
Potassium: 4.2 mEq/L (ref 3.5–5.1)
Sodium: 141 mEq/L (ref 135–145)

## 2013-02-10 LAB — TROPONIN I: Troponin I: <0.3 ng/mL (ref <0.30)

## 2013-02-10 MED ORDER — INSULIN GLARGINE 100 UNIT/ML ~~LOC~~ SOLN
18.0000 [IU] | Freq: Every day | SUBCUTANEOUS | Status: DC
  Administered 2013-02-10 – 2013-02-13 (×4): 18 [IU] via SUBCUTANEOUS

## 2013-02-10 MED ORDER — METOPROLOL TARTRATE 1 MG/ML IV SOLN
INTRAVENOUS | Status: AC
  Filled 2013-02-10: qty 5

## 2013-02-10 MED ORDER — ENOXAPARIN SODIUM 30 MG/0.3ML ~~LOC~~ SOLN
30.0000 mg | SUBCUTANEOUS | Status: DC
  Administered 2013-02-11 – 2013-02-14 (×4): 30 mg via SUBCUTANEOUS
  Filled 2013-02-10 (×5): qty 0.3

## 2013-02-10 MED ORDER — FUROSEMIDE 10 MG/ML IJ SOLN
INTRAMUSCULAR | Status: AC
  Administered 2013-02-10: 60 mg via INTRAVENOUS
  Filled 2013-02-10: qty 8

## 2013-02-10 NOTE — Progress Notes (Signed)
Patient ID: Nicole [name], female   DOB: 1935-11-24, 78 y.o.   MRN: [id number]   Patient Name: Nicole [name] Date of Encounter: 02/10/2013    SUBJECTIVE  Breathing better with no further chest tightness  CURRENT MEDS . aspirin  81 mg Oral Daily  . carvedilol  6.25 mg Oral BID WC  . cholecalciferol  1,000 Units Oral Daily  . enoxaparin (LOVENOX) injection  40 mg Subcutaneous Q24H  . felodipine  10 mg Oral Daily  . ferrous sulfate  325 mg Oral Q breakfast  . furosemide  60 mg Intravenous BID  . insulin aspart  0-20 Units Subcutaneous TID WC  . insulin aspart  0-5 Units Subcutaneous QHS  . insulin glargine  18 Units Subcutaneous QHS  . levothyroxine  75 mcg Oral QAC breakfast  . multivitamin with minerals  1 tablet Oral Daily  . pantoprazole  40 mg Oral BID  . potassium chloride  20 mEq Oral BID  . quinapril  40 mg Oral Daily  . simvastatin  40 mg Oral q1800  . sodium chloride  3 mL Intravenous Q12H  . vitamin B-12  1,000 mcg Oral Daily  . vitamin C  500 mg Oral Daily    OBJECTIVE  Filed Vitals:   02/09/13 1800 02/09/13 1801 02/09/13 2100 02/10/13 0500  BP: 177/37 180/35 147/43 172/37  Pulse: 71  66 62  Temp: 98.4 F (36.9 C)  98.5 F (36.9 C) 98.5 F (36.9 C)  TempSrc: Oral  Oral Oral  Resp: 17  20 20   Height:      Weight:    144 lb 3.2 oz (65.409 kg)  SpO2: 96%  97% 95%    Intake/Output Summary (Last 24 hours) at 02/10/13 0842 Last data filed at 02/10/13 0500  Gross per 24 hour  Intake    360 ml  Output    900 ml  Net   -540 ml   Filed Weights   02/09/13 1201 02/10/13 0500  Weight: 144 lb 12.8 oz (65.681 kg) 144 lb 3.2 oz (65.409 kg)    PHYSICAL EXAM  General: Pleasant, NAD. Neuro: Alert and oriented X 3. Moves all extremities spontaneously. Psych: Normal affect. HEENT:  Normal  Neck: Supple without bruits or JVD. Lungs:  Resp regular and unlabored, bibasilar crackles Heart: RRR no s3, s4, or murmurs. Abdomen: Soft, non-tender,  non-distended, BS + x 4.  Extremities: No clubbing, cyanosis or edema. DP/PT/Radials 2+ and equal bilaterally.  Accessory Clinical Findings  CBC  Recent Labs  02/09/13 1230  WBC 7.1  NEUTROABS 4.8  HGB 10.0*  HCT 28.9*  MCV 87.8  PLT 263   Basic Metabolic Panel  Recent Labs  02/09/13 1230 02/10/13 0628  NA 138 141  K 4.0 4.2  CL 102 104  CO2 24 27  GLUCOSE 156* 72  BUN 33* 31*  CREATININE 1.32* 1.49*  CALCIUM 9.2 9.0   Liver Function Tests  Recent Labs  02/09/13 1230  AST 17  ALT 12  ALKPHOS 123*  BILITOT 0.6  PROT 6.6  ALBUMIN 3.1*   No results found for this basename: LIPASE, AMYLASE,  in the last 72 hours Cardiac Enzymes  Recent Labs  02/09/13 1230 02/09/13 1739 02/09/13 2356  TROPONINI <0.30 <0.30 <0.30   BNP No components found with this basename: POCBNP,  D-Dimer No results found for this basename: DDIMER,  in the last 72 hours Hemoglobin A1C No results found for this basename: HGBA1C,  in the last 72 hours Fasting  Lipid Panel No results found for this basename: CHOL, HDL, LDLCALC, TRIG, CHOLHDL, LDLDIRECT,  in the last 72 hours Thyroid Function Tests  Recent Labs  02/09/13 1230  TSH 4.501*    TELE  NSR  ECG   Radiology/Studies  Dg Chest 2 View  02/10/2013  *RADIOLOGY REPORT*  Clinical Data: Chest tightness, shortness of breath, CHF  CHEST - 2 VIEW  Comparison: 03/19/2011  Findings: Enlargement of cardiac silhouette with pulmonary vascular congestion. Atherosclerotic calcification aorta. Scattered interstitial infiltrates and small bibasilar pleural effusions consistent with CHF. No pneumothorax. Diffuse osseous demineralization with degenerative changes of cervical and thoracic spine.  IMPRESSION: CHF with small bibasilar effusions.   Original Report Authenticated By: Ulyses Southward, M.D.     ASSESSMENT AND PLAN     ACDCHF Noncoronary CP HTN  Continue diuresis. Watch BUN/Cr. Weight was up 10 pounds at home. Discharge on 80mg   q am and 40mg  q pm of Lasix.  Signed, Valera Castle MD

## 2013-02-11 LAB — BASIC METABOLIC PANEL
BUN: 33 mg/dL — ABNORMAL HIGH (ref 6–23)
Creatinine, Ser: 1.57 mg/dL — ABNORMAL HIGH (ref 0.50–1.10)
GFR calc Af Amer: 36 mL/min — ABNORMAL LOW (ref >90)
GFR calc non Af Amer: 31 mL/min — ABNORMAL LOW (ref >90)
Glucose, Bld: 144 mg/dL — ABNORMAL HIGH (ref 70–99)

## 2013-02-11 LAB — GLUCOSE, CAPILLARY
Glucose-Capillary: 189 mg/dL — ABNORMAL HIGH (ref 70–99)
Glucose-Capillary: 202 mg/dL — ABNORMAL HIGH (ref 70–99)
Glucose-Capillary: 197 mg/dL — ABNORMAL HIGH (ref 70–99)
Glucose-Capillary: 175 mg/dL — ABNORMAL HIGH (ref 70–99)

## 2013-02-11 MED ORDER — FUROSEMIDE 40 MG PO TABS
40.0000 mg | ORAL_TABLET | Freq: Two times a day (BID) | ORAL | Status: DC
  Administered 2013-02-11: 40 mg via ORAL
  Filled 2013-02-11 (×4): qty 1

## 2013-02-11 NOTE — Progress Notes (Signed)
Subjective:  The patient has diuresed 3 pounds so for since hospitalization and her breathing has improved.  Her renal function has deteriorated slightly.  She has not had any chest discomfort.  Objective:  Vital Signs in the last 24 hours: Temp:  [98.2 F (36.8 C)-98.6 F (37 C)] 98.6 F (37 C) (03/08 0400) Pulse Rate:  [57-63] 57 (03/08 0400) Resp:  [18] 18 (03/08 0400) BP: (154-176)/(40-60) 176/40 mmHg (03/08 1042) SpO2:  [91 %-95 %] 94 % (03/08 0744) Weight:  [141 lb 6.4 oz (64.139 kg)] 141 lb 6.4 oz (64.139 kg) (03/08 0500)  Intake/Output from previous day:   Intake/Output from this shift:    . aspirin  81 mg Oral Daily  . carvedilol  6.25 mg Oral BID WC  . cholecalciferol  1,000 Units Oral Daily  . enoxaparin (LOVENOX) injection  30 mg Subcutaneous Q24H  . felodipine  10 mg Oral Daily  . ferrous sulfate  325 mg Oral Q breakfast  . furosemide  60 mg Intravenous BID  . insulin aspart  0-20 Units Subcutaneous TID WC  . insulin aspart  0-5 Units Subcutaneous QHS  . insulin glargine  18 Units Subcutaneous QHS  . levothyroxine  75 mcg Oral QAC breakfast  . multivitamin with minerals  1 tablet Oral Daily  . pantoprazole  40 mg Oral BID  . potassium chloride  20 mEq Oral BID  . quinapril  40 mg Oral Daily  . simvastatin  40 mg Oral q1800  . sodium chloride  3 mL Intravenous Q12H  . vitamin B-12  1,000 mcg Oral Daily  . vitamin C  500 mg Oral Daily      Physical Exam: The patient appears to be in no distress.  Head and neck exam reveals that the pupils are equal and reactive.  The extraocular movements are full.  There is no scleral icterus.  Mouth and pharynx are benign.  No lymphadenopathy.  No carotid bruits.  The jugular venous pressure is normal.  Thyroid is not enlarged or tender.  Chest is clear to percussion and auscultation.  No rales or rhonchi.  Expansion of the chest is symmetrical.  Heart reveals no abnormal lift or heave.  First and second heart sounds  are normal.  There is no gallop rub or click.  There is a grade 2/6 systolic ejection murmur at the base.   The abdomen is soft and nontender.  Bowel sounds are normoactive.  There is no hepatosplenomegaly or mass.  There are no abdominal bruits.  Extremities reveal no phlebitis or edema.  Pedal pulses are present  There is no cyanosis or clubbing.  Neurologic exam is normal strength and no lateralizing weakness.  No sensory deficits.  Integument reveals no rash  Lab Results:  Recent Labs  02/09/13 1230  WBC 7.1  HGB 10.0*  PLT 263    Recent Labs  02/10/13 0628 02/11/13 0622  NA 141 141  K 4.2 4.7  CL 104 106  CO2 27 27  GLUCOSE 72 144*  BUN 31* 33*  CREATININE 1.49* 1.57*    Recent Labs  02/09/13 1739 02/09/13 2356  TROPONINI <0.30 <0.30   Hepatic Function Panel  Recent Labs  02/09/13 1230  PROT 6.6  ALBUMIN 3.1*  AST 17  ALT 12  ALKPHOS 123*  BILITOT 0.6   No results found for this basename: CHOL,  in the last 72 hours No results found for this basename: PROTIME,  in the last 72 hours  Imaging:  Dg Chest 2 View  02/10/2013  *RADIOLOGY REPORT*  Clinical Data: Chest tightness, shortness of breath, CHF  CHEST - 2 VIEW  Comparison: 03/19/2011  Findings: Enlargement of cardiac silhouette with pulmonary vascular congestion. Atherosclerotic calcification aorta. Scattered interstitial infiltrates and small bibasilar pleural effusions consistent with CHF. No pneumothorax. Diffuse osseous demineralization with degenerative changes of cervical and thoracic spine.  IMPRESSION: CHF with small bibasilar effusions.   Original Report Authenticated By: Ulyses Southward, M.D.     Cardiac Studies:  telemetry shows normal sinus rhythm Assessment/Plan 1. Acute on Chronic Diastolic CHF: She is volume overloaded.  The patient is improving. 2. Chest Pain: . 3. Coronary Artery Disease: 4. Hypertension: Continue current regimen. 5. Diabetes: Continue current regimen. Cover with  [facility]. 6.  7.    Plan: Will update 2D echo. Will switch her to oral lasix today and continue to watch BUN/creat closely.  LOS: 2 days    Cassell Clement 02/11/2013, 11:40 AM

## 2013-02-11 NOTE — Progress Notes (Signed)
  Echocardiogram 2D Echocardiogram has been performed.  Nicole [name], Nicole [name] 02/11/2013, 5:29 PM

## 2013-02-12 ENCOUNTER — Inpatient Hospital Stay (HOSPITAL_COMMUNITY): Payer: Medicare Other

## 2013-02-12 LAB — GLUCOSE, CAPILLARY
Glucose-Capillary: 316 mg/dL — ABNORMAL HIGH (ref 70–99)
Glucose-Capillary: 215 mg/dL — ABNORMAL HIGH (ref 70–99)

## 2013-02-12 LAB — BASIC METABOLIC PANEL
BUN: 35 mg/dL — ABNORMAL HIGH (ref 6–23)
Chloride: 101 mEq/L (ref 96–112)
GFR calc Af Amer: 33 mL/min — ABNORMAL LOW (ref >90)
GFR calc non Af Amer: 29 mL/min — ABNORMAL LOW (ref >90)
Glucose, Bld: 300 mg/dL — ABNORMAL HIGH (ref 70–99)
Potassium: 5 mEq/L (ref 3.5–5.1)
Sodium: 135 mEq/L (ref 135–145)

## 2013-02-12 MED ORDER — FUROSEMIDE 10 MG/ML IJ SOLN
INTRAMUSCULAR | Status: AC
  Administered 2013-02-12: 60 mg via INTRAVENOUS
  Filled 2013-02-12: qty 8

## 2013-02-12 MED ORDER — FUROSEMIDE 40 MG PO TABS
60.0000 mg | ORAL_TABLET | Freq: Two times a day (BID) | ORAL | Status: DC
  Administered 2013-02-12 – 2013-02-15 (×6): 60 mg via ORAL
  Filled 2013-02-12 (×8): qty 1

## 2013-02-12 MED ORDER — FUROSEMIDE 10 MG/ML IJ SOLN: 60.0000 mg | Freq: Once | INTRAMUSCULAR | Status: AC

## 2013-02-12 NOTE — Progress Notes (Signed)
Pt with c/o SOB and wheezing.  Pt. States "It feels like what brought me here when I get overloaded."  Expiratory wheezing auscultated in bilateral upper lobes.  BP 189/60 manually.  MD notified.  Order received for 60 mg IV lasix.  IV lasix given per MD order.  Will continue to monitor and day shift RN made aware.

## 2013-02-12 NOTE — Progress Notes (Addendum)
Subjective:  Became more dyspneic this am and received 60 mg IV lasix. Feels better now. Weight down one more pound this am Objective:  Vital Signs in the last 24 hours: Temp:  [98.2 F (36.8 C)-99.1 F (37.3 C)] 98.3 F (36.8 C) (03/09 0846) Pulse Rate:  [58-66] 65 (03/09 0846) Resp:  [16-18] 18 (03/09 0846) BP: (125-191)/(28-67) 181/67 mmHg (03/09 0846) SpO2:  [93 %-98 %] 98 % (03/09 0846) Weight:  [140 lb 10.5 oz (63.8 kg)] 140 lb 10.5 oz (63.8 kg) (03/09 0600)  Intake/Output from previous day:   Intake/Output from this shift: Total I/O In: 360 [P.O.:360] Out: -   . aspirin  81 mg Oral Daily  . carvedilol  6.25 mg Oral BID WC  . cholecalciferol  1,000 Units Oral Daily  . enoxaparin (LOVENOX) injection  30 mg Subcutaneous Q24H  . felodipine  10 mg Oral Daily  . ferrous sulfate  325 mg Oral Q breakfast  . furosemide  40 mg Oral BID  . insulin aspart  0-20 Units Subcutaneous TID WC  . insulin aspart  0-5 Units Subcutaneous QHS  . insulin glargine  18 Units Subcutaneous QHS  . levothyroxine  75 mcg Oral QAC breakfast  . multivitamin with minerals  1 tablet Oral Daily  . pantoprazole  40 mg Oral BID  . potassium chloride  20 mEq Oral BID  . quinapril  40 mg Oral Daily  . simvastatin  40 mg Oral q1800  . sodium chloride  3 mL Intravenous Q12H  . vitamin B-12  1,000 mcg Oral Daily  . vitamin C  500 mg Oral Daily      Physical Exam: The patient appears to be in no distress.  Head and neck exam reveals that the pupils are equal and reactive.  The extraocular movements are full.  There is no scleral icterus.  Mouth and pharynx are benign.  No lymphadenopathy.  No carotid bruits.  The jugular venous pressure is normal.  Thyroid is not enlarged or tender.  Chest is clear to percussion and auscultation.  No rales or rhonchi.  Expansion of the chest is symmetrical.  Heart reveals no abnormal lift or heave.  First and second heart sounds are normal.  There is no gallop rub  or click.  There is a grade 2/6 systolic ejection murmur at the base.   The abdomen is soft and nontender.  Bowel sounds are normoactive.  There is no hepatosplenomegaly or mass.  There are no abdominal bruits.  Extremities reveal no phlebitis or edema.  Pedal pulses are present  There is no cyanosis or clubbing.  Neurologic exam is normal strength and no lateralizing weakness.  No sensory deficits.  Integument reveals no rash  Lab Results:  Recent Labs  02/09/13 1230  WBC 7.1  HGB 10.0*  PLT 263    Recent Labs  02/11/13 0622 02/12/13 0500  NA 141 135  K 4.7 5.0  CL 106 101  CO2 27 24  GLUCOSE 144* 300*  BUN 33* 35*  CREATININE 1.57* 1.67*    Recent Labs  02/09/13 1739 02/09/13 2356  TROPONINI <0.30 <0.30   Hepatic Function Panel  Recent Labs  02/09/13 1230  PROT 6.6  ALBUMIN 3.1*  AST 17  ALT 12  ALKPHOS 123*  BILITOT 0.6   No results found for this basename: CHOL,  in the last 72 hours No results found for this basename: PROTIME,  in the last 72 hours  Imaging: 2D echo done yesterday,  results pending. Cardiac Studies:  telemetry shows normal sinus rhythm. Rare PVC Assessment/Plan Acute on chronic diastolic CHF.        Plan: increase lasix back to 60 mg BID.  Recheck xray. Renal function slightly worse. HTN Noncoronary chest pain-- quiescent.  Cassell Clement 02/12/2013, 10:52 AM

## 2013-02-13 DIAGNOSIS — I509 Heart failure, unspecified: Secondary | ICD-10-CM

## 2013-02-13 DIAGNOSIS — R609 Edema, unspecified: Secondary | ICD-10-CM

## 2013-02-13 DIAGNOSIS — I2510 Atherosclerotic heart disease of native coronary artery without angina pectoris: Secondary | ICD-10-CM

## 2013-02-13 DIAGNOSIS — I5032 Chronic diastolic (congestive) heart failure: Secondary | ICD-10-CM

## 2013-02-13 LAB — GLUCOSE, CAPILLARY
Glucose-Capillary: 208 mg/dL — ABNORMAL HIGH (ref 70–99)
Glucose-Capillary: 301 mg/dL — ABNORMAL HIGH (ref 70–99)

## 2013-02-13 LAB — BASIC METABOLIC PANEL
Calcium: 9 mg/dL (ref 8.4–10.5)
GFR calc Af Amer: 37 mL/min — ABNORMAL LOW (ref >90)
GFR calc non Af Amer: 32 mL/min — ABNORMAL LOW (ref >90)
Glucose, Bld: 358 mg/dL — ABNORMAL HIGH (ref 70–99)
Potassium: 4.8 mEq/L (ref 3.5–5.1)
Sodium: 136 mEq/L (ref 135–145)

## 2013-02-13 MED ORDER — FUROSEMIDE 10 MG/ML IJ SOLN
60.0000 mg | Freq: Once | INTRAMUSCULAR | Status: AC
  Administered 2013-02-13: 60 mg via INTRAVENOUS
  Filled 2013-02-13: qty 6

## 2013-02-13 MED ORDER — INSULIN ASPART 100 UNIT/ML ~~LOC~~ SOLN
0.0000 [IU] | Freq: Three times a day (TID) | SUBCUTANEOUS | Status: DC
  Administered 2013-02-13 – 2013-02-15 (×3): 3 [IU] via SUBCUTANEOUS
  Administered 2013-02-15: 5 [IU] via SUBCUTANEOUS

## 2013-02-13 MED ORDER — INSULIN GLARGINE 100 UNIT/ML ~~LOC~~ SOLN
23.0000 [IU] | Freq: Every day | SUBCUTANEOUS | Status: DC
  Administered 2013-02-14: 10:00:00 via SUBCUTANEOUS
  Administered 2013-02-15: 23 [IU] via SUBCUTANEOUS

## 2013-02-13 NOTE — Progress Notes (Addendum)
Inpatient Diabetes Program Recommendations  AACE/ADA: New Consensus Statement on Inpatient Glycemic Control (2013)  Target Ranges:  Prepandial:   less than 140 mg/dL      Peak postprandial:   less than 180 mg/dL (1-2 hours)      Critically ill patients:  140 - 180 mg/dL    Results for ARMYA, WESTERHOFF (MRN [id number]) as of 02/13/2013 12:40  Ref. Range 02/12/2013 07:22 02/12/2013 11:40 02/12/2013 16:57 02/12/2013 21:01  Glucose-Capillary Latest Range: 70-99 mg/dL 409 (H) 811 (H) 914 (H) 215 (H)   Results for KWANA, RINGEL (MRN [id number]) as of 02/13/2013 12:40  Ref. Range 02/13/2013 07:33  Glucose-Capillary Latest Range: 70-99 mg/dL 086 (H)   Noted patient refusing to take prescribed amount of Novolog.  Only willing to take smaller doses of Novolog.  Fasting glucose levels remain >200 mg/dl.  Noted Novolog Resistant correction scale (SSI) d/c'd today, however, HS scale coverage still ordered.  Recommend the following: 1. Start Novolog Sensitive correction scale (SSI) tid 2. Increase Lantus to 23 units daily at 10AM 3. Check current A1c- Last A1c on file was 11.1% (10/26/12)  Will follow. Ambrose Finland RN, MSN, CDE Diabetes Coordinator Inpatient Diabetes Program [phone]

## 2013-02-13 NOTE — Progress Notes (Addendum)
Subjective: Breathing some better.  No CP  Objective: Filed Vitals:   02/12/13 2158 02/13/13 0500 02/13/13 0604 02/13/13 0922  BP: 158/68  158/78 189/63  Pulse:   61 57  Temp:   98.3 F (36.8 C)   TempSrc:      Resp:   18   Height:      Weight:  134 lb 7.7 oz (61 kg)    SpO2:   96%    Weight change: -6 lb 2.8 oz (-2.8 kg)  Intake/Output Summary (Last 24 hours) at 02/13/13 0949 Last data filed at 02/12/13 1700  Gross per 24 hour  Intake    720 ml  Output      0 ml  Net    720 ml   I/O incomplete  General: Alert, awake, oriented x3, in no acute distress Neck:  JVP is normal Heart: Regular rate and rhythm, without murmurs, rubs, gallops.  Lungs: Clear to auscultation.  No rales or wheezes. Exemities:  No edema.   Neuro: Grossly intact, nonfocal.  Tele:  SR 60s Lab Results: Results for orders placed during the hospital encounter of 02/09/13 (from the past 24 hour(s))  GLUCOSE, CAPILLARY     Status: Abnormal   Collection Time    02/12/13 11:40 AM      Result Value Range   Glucose-Capillary 257 (*) 70 - 99 mg/dL  GLUCOSE, CAPILLARY     Status: Abnormal   Collection Time    02/12/13  4:57 PM      Result Value Range   Glucose-Capillary 129 (*) 70 - 99 mg/dL   Comment 1 Notify RN    GLUCOSE, CAPILLARY     Status: Abnormal   Collection Time    02/12/13  9:01 PM      Result Value Range   Glucose-Capillary 215 (*) 70 - 99 mg/dL  GLUCOSE, CAPILLARY     Status: Abnormal   Collection Time    02/13/13  7:33 AM      Result Value Range   Glucose-Capillary 251 (*) 70 - 99 mg/dL    Studies/Results: Echo  :Left ventricle: The cavity size was normal. Systolic function was normal. The estimated ejection fraction was in the range of 60% to 65%. Wall motion was normal; there were no regional wall motion abnormalities. Features are consistent with a pseudonormal left ventricular filling pattern, with concomitant abnormal relaxation and increased filling pressure (grade 2  diastolic dysfunction). - Mitral valve: Calcified annulus. Mildly thickened leaflets . Mild regurgitation. - Right ventricle: The cavity size was mildly dilated. Wall thickness was normal. - Right atrium: The atrium was mildly dilated. - Pulmonary arteries: Systolic pressure was mildly increased. PA peak pressure: 41mm Hg (S). - Pericardium, extracardiac: There was a left pleural effusion. Impressions:  - Compared to the prior study, there has been no significant interval change.      Medications: Reviewed.  Impression:  1.  Diastolic CHF  Breathing is improving  Not baseline CXR yesterday still with pulmonary edema ? If lag  BNP is elevated.   Echo as noted Cr is better  Would give additional IV lasix  Check labs in AM  2.  CAD  I have reviewed L heart cath with T Stuckey.  On last cath L Cx was heavily calcified.  Prox LCx was notedd to have tight narrowing but FFR on L Cx was near normal  I would continue medical Rx for now.  I am not convinced above presentation represents coronary ischemia with  worsening diastolic function.  Has responded some.  2.  HTN  Follow.   3.  DM  Have switch SS insulin  Patient lives by [employer] alone.  NO power.  Car stuck in garage.  LOS: 4 days   Dietrich Pates 02/13/2013, 9:49 AM

## 2013-02-14 LAB — BASIC METABOLIC PANEL
BUN: 36 mg/dL — ABNORMAL HIGH (ref 6–23)
CO2: 28 mEq/L (ref 19–32)
GFR calc non Af Amer: 29 mL/min — ABNORMAL LOW (ref >90)
Glucose, Bld: 243 mg/dL — ABNORMAL HIGH (ref 70–99)
Potassium: 5.2 mEq/L — ABNORMAL HIGH (ref 3.5–5.1)
Sodium: 136 mEq/L (ref 135–145)

## 2013-02-14 LAB — GLUCOSE, CAPILLARY
Glucose-Capillary: 249 mg/dL — ABNORMAL HIGH (ref 70–99)
Glucose-Capillary: 488 mg/dL — ABNORMAL HIGH (ref 70–99)
Glucose-Capillary: 250 mg/dL — ABNORMAL HIGH (ref 70–99)
Glucose-Capillary: 273 mg/dL — ABNORMAL HIGH (ref 70–99)

## 2013-02-14 LAB — GLUCOSE, RANDOM: Glucose, Bld: 553 mg/dL (ref 70–99)

## 2013-02-14 MED ORDER — INSULIN ASPART 100 UNIT/ML ~~LOC~~ SOLN
15.0000 [IU] | Freq: Once | SUBCUTANEOUS | Status: AC
  Administered 2013-02-14: 15 [IU] via SUBCUTANEOUS

## 2013-02-14 NOTE — Progress Notes (Signed)
Patient ID: Nicole [name], female   DOB: Apr 08, 1935, 78 y.o.   MRN: [id number] Chart reviewed. Weight near baseline. Change to 80mg  of Lasix po q am and 40mg  q pm. Close to discharge. I will arrange close follow up in our office within 1 week of discharge.

## 2013-02-14 NOTE — Progress Notes (Signed)
Notified Rhonda of serum blood glucose. Will cont to monitor

## 2013-02-15 ENCOUNTER — Encounter (HOSPITAL_COMMUNITY): Payer: Self-pay | Admitting: Physician Assistant

## 2013-02-15 ENCOUNTER — Telehealth: Payer: Self-pay | Admitting: Cardiology

## 2013-02-15 DIAGNOSIS — I739 Peripheral vascular disease, unspecified: Secondary | ICD-10-CM | POA: Insufficient documentation

## 2013-02-15 DIAGNOSIS — I5033 Acute on chronic diastolic (congestive) heart failure: Secondary | ICD-10-CM

## 2013-02-15 LAB — GLUCOSE, CAPILLARY: Glucose-Capillary: 215 mg/dL — ABNORMAL HIGH (ref 70–99)

## 2013-02-15 LAB — BASIC METABOLIC PANEL
CO2: 29 mEq/L (ref 19–32)
Chloride: 100 mEq/L (ref 96–112)
Creatinine, Ser: 1.69 mg/dL — ABNORMAL HIGH (ref 0.50–1.10)
GFR calc Af Amer: 33 mL/min — ABNORMAL LOW (ref >90)
Potassium: 5.1 mEq/L (ref 3.5–5.1)
Sodium: 138 mEq/L (ref 135–145)

## 2013-02-15 MED ORDER — QUINAPRIL HCL 40 MG PO TABS: 40.0000 mg | ORAL_TABLET | Freq: Every day | ORAL | Status: DC

## 2013-02-15 MED ORDER — FUROSEMIDE 80 MG PO TABS: ORAL_TABLET | ORAL | Status: DC

## 2013-02-15 MED ORDER — POTASSIUM CHLORIDE CRYS ER 20 MEQ PO TBCR: 20.0000 meq | EXTENDED_RELEASE_TABLET | Freq: Two times a day (BID) | ORAL | Status: DC

## 2013-02-15 NOTE — Discharge Summary (Signed)
Discharge Summary   Patient ID: Nicole [name],  MRN: [id number], DOB/AGE: 1935/02/10 78 y.o.  Admit date: 02/09/2013 Discharge date: 02/15/2013  Primary Physician: Michele Mcalpine, MD Primary Cardiologist: T. Wall, MD   Discharge Diagnoses Principal Problem:   Acute on chronic diastolic CHF (congestive heart failure) Active Problems:   HYPOTHYROIDISM   HYPERCHOLESTEROLEMIA   HYPERTENSION   CAD   GERD (gastroesophageal reflux disease)   Type 2 diabetes mellitus   Prerenal azotemia   Allergies Allergies  Allergen Reactions  . Ibuprofen     REACTION: edema---thyroid problems  . Prednisone     Causes severe fluid retention    Diagnostic Studies/Procedures  PA/LATERAL CHEST X-RAY - 02/10/13  IMPRESSION:  CHF with small bibasilar effusions.  PA/LATERAL CHEST X-RAY - 02/11/13  IMPRESSION:  Grossly unchanged findings of pulmonary edema, small bilateral effusions and bibasilar opacities, atelectasis versus infiltrate. A follow-up chest radiograph in 4 to 6 weeks after treatment is recommended to ensure resolution.  TRANSTHORACIC ECHOCARDIOGRAM - 02/11/13  - Left ventricle: The cavity size was normal. Systolic function was normal. The estimated ejection fraction was in the range of 60% to 65%. Wall motion was normal; there were no regional wall motion abnormalities. Features are consistent with a pseudonormal left ventricular filling pattern, with concomitant abnormal relaxation and increased filling pressure (grade 2 diastolic dysfunction). - Mitral valve: Calcified annulus. Mildly thickened leaflets . Mild regurgitation. - Right ventricle: The cavity size was mildly dilated. Wall thickness was normal. - Right atrium: The atrium was mildly dilated. - Pulmonary arteries: Systolic pressure was mildly increased. PA peak pressure: 41mm Hg (S). - Pericardium, extracardiac: There was a left pleural effusion.  History of Present Illness  Nicole [name] is a 78 y.o.  female who was direct-admitted to [facility] hospital from the office on 02/09/13 with the above problem list.   CAD, status post prior stenting of the circumflex, PAD, diastolic CHF, carotid stenosis, HTN, HL, DM2, hypothyroidism. LHC 4/12: LAD 50-60%, proximal circumflex 90-95%, mid AV groove circumflex 50%, proximal OM 2 stents patent, RCA proximal-mid 70-80%, EF 65%. FFR was 0.97. This was not hemodynamically significant and medical therapy was recommended. Echocardiogram 5/13: EF 65-70%, MAC, mild MR, PASP 34. Last seen by Dr. Daleen Squibb 9/13.   She followed up with Tereso Newcomer, PA-C in the office on 02/09/13 reporting that over the last 2 months, she had noted increased DOE as well as chest pain with exertion partially revlieved by NTG SL. She also noted orthopnea, PND, increased LE edema and a 10 pound weight gain. She has increased Lasix on her own to 80 mg daily with no relief.   Hospital Course   The question was raised whether her chest discomfort was from decompensated CHF or decompensated CHF was a result of ischemic chest discomfort. She was direct-admitted for IV diuresis and further evaluation. pBNP returned elevated at ~2500K. Trop-I WNL x 3. TSH returned unremarkable. CXR as above revealed changes consistent with CHF and bibasilar effusions. She diuresed slowly. A repeat CXR two days later revealed minimal change. Objectively, her weight reduced by 16-17 lbs. She did develop a mild prerenal azotemia with continued diuresis. Dr. Tenny Craw discussed repeat cardiac cath with Dr. Riley Kill to evaluate for an ischemic etiology. Given heavy calcification and FFR >0.8 on prior cath, the decision was made to medical manage first, and consider repeat cath later if symptoms worsen.  The patient was evaluated by Dr. Patty Sermons today and deemed stable for discharge. She lives alone at  home and was noted to have elevated blood glucose readings in he hospital suggesting a poor insight or management of her medical  problems. [facility] care management has been consulted. She will follow-up in the office in </= 7 days for TOC. She will have a repeat BMET at that time. She will be discharged on a modified Lasix regimen. ACEi will be held until her renal function is assessed on follow-up. She has been advised to follow-up with her PCP regarding insulin and diabetes management given hyperglycemia while inpatient at A1C 11.1% in 10/2012. This information, including heart failure education and management, has been clearly outlined in the discharge AVS.   Discharge Vitals:  Blood pressure 158/63, pulse 60, temperature 98.3 F (36.8 C), temperature source Oral, resp. rate 16, height 5\' 1"  (1.549 m), weight 57.652 kg (127 lb 1.6 oz), SpO2 98.00%.   Labs:   Recent Labs Lab 02/09/13 1230  02/13/13 0906 02/14/13 0540 02/14/13 1232 02/15/13 0624  NA 138  < > 136 136  --  138  K 4.0  < > 4.8 5.2*  --  5.1  CL 102  < > 100 101  --  100  CO2 24  < > 25 28  --  29  BUN 33*  < > 34* 36*  --  38*  CREATININE 1.32*  < > 1.53* 1.66*  --  1.69*  CALCIUM 9.2  < > 9.0 8.8  --  8.9  PROT 6.6  --   --   --   --   --   BILITOT 0.6  --   --   --   --   --   ALKPHOS 123*  --   --   --   --   --   ALT 12  --   --   --   --   --   AST 17  --   --   --   --   --   GLUCOSE 156*  < > 358* 243* 553* 204*  < > = values in this interval not displayed.  Disposition:  Discharge Orders   Future Appointments Provider Department Dept Phone   02/22/2013 10:45 AM [facility] Main Office [place]) [phone]   02/22/2013 11:10 AM Beatrice Lecher, PA-C [facility] Main Office [place]) [phone]   03/06/2013 9:15 AM Gaylord Shih, MD [facility] Main Office [place]) [phone]   04/26/2013 10:00 AM Michele Mcalpine, MD [facility] [phone]   Future Orders Complete By Expires     Diet - low sodium heart healthy  As directed     Increase activity slowly  As directed           Follow-up  Information   Follow up with Tereso Newcomer, PA-C On 02/22/2013. (At 11:10 AM for follow-up and labwork. )    Contact information:   [address] [place] [address] [phone]       Follow up with NADEL,SCOTT M, MD. Schedule an appointment as soon as possible for a visit in 1 week. (For management of insulin and diabetes. )    Contact information:   [address] [place] [address] [phone]      Discharge Medications:    Medication List    TAKE these medications       acetaminophen 500 MG tablet  Commonly known as:  TYLENOL  Take 500 mg by mouth every 6 (six) hours as needed for  pain.     aspirin EC 81 MG tablet  Take 81 mg by mouth daily.     b complex vitamins tablet  Take 1 tablet by mouth daily.     carvedilol 6.25 MG tablet  Commonly known as:  COREG  Take 1 tablet (6.25 mg total) by mouth 2 (two) times daily with a meal.     felodipine 10 MG 24 hr tablet  Commonly known as:  PLENDIL  Take 1 tablet (10 mg total) by mouth daily.     FEOSOL 45 MG Tabs  Generic drug:  carbonyl iron  Take 45 mg by mouth daily.     furosemide 80 MG tablet  Commonly known as:  LASIX  Take one tablet (80mg ) in the AM. Take one half tablet (40mg ) in the PM.     insulin glargine 100 UNIT/ML injection  Commonly known as:  LANTUS  Inject 18 Units into the skin at bedtime.     insulin lispro 100 UNIT/ML injection  Commonly known as:  HUMALOG  Inject 0-10 Units into the skin 3 (three) times daily before meals. Home sliding scale     levothyroxine 75 MCG tablet  Commonly known as:  SYNTHROID  Take 1 tablet (75 mcg total) by mouth daily.     multivitamin with minerals Tabs  Take 1 tablet by mouth daily.     nitroGLYCERIN 0.4 MG SL tablet  Commonly known as:  NITROSTAT  Place 0.4 mg under the tongue every 5 (five) minutes as needed for chest pain.     pantoprazole 40 MG tablet  Commonly known as:  PROTONIX  Take 1 tablet (40 mg total) by mouth 2  (two) times daily. 30 minutes before breakfast and dinner     potassium chloride SA 20 MEQ tablet  Commonly known as:  K-DUR,KLOR-CON  Take 1 tablet (20 mEq total) by mouth 2 (two) times daily.     pravastatin 80 MG tablet  Commonly known as:  PRAVACHOL  Take 80 mg by mouth at bedtime.     quinapril 40 MG tablet  Commonly known as:  ACCUPRIL  Take 1 tablet (40 mg total) by mouth daily. Hold off on taking this medication until labwork is drawn on 02/22/13.     traMADol 50 MG tablet  Commonly known as:  ULTRAM  Take 50 mg by mouth every 6 (six) hours as needed for pain.     vitamin B-12 1000 MCG tablet  Commonly known as:  CYANOCOBALAMIN  Take 1,000 mcg by mouth daily.     vitamin C 500 MG tablet  Commonly known as:  ASCORBIC ACID  Take 500 mg by mouth daily. To take with iron tablet daily     Vitamin D 1000 UNITS capsule  Take 1,000 Units by mouth daily.       Outstanding Labs/Studies: BMET on 02/22/13  Duration of Discharge Encounter: Greater than 30 minutes including physician time.  Signed, R. Hurman Horn, PA-C 02/15/2013, 11:09 AM

## 2013-02-15 NOTE — Telephone Encounter (Signed)
New problem    TOC 7 days appt with Bing Neighbors on  3/19 @ 11:10

## 2013-02-15 NOTE — Progress Notes (Signed)
Patient evaluated for community based chronic disease management services with [facility] Care Management Program as a benefit of patient's [employer]. Patient will receive a post discharge transition of care call and will be evaluated for monthly home visits for assessments and disease process education. Spoke with patient at bedside to explain [facility] Care Management services.  Patient lives alone and indicated that she experienced difficulty calling for help prior to admission.  For this reason requested RN CM to provide life alert information and [facility] will follow up with providing consideration for a first responder key box at the home.  In addition, prior to admission patient indicated that she did not contact her cardiologist or PCP when she experienced a significant increase in weight.  Will collaborate with [facility] nurse to confirm that patient is knowledgeable of her intervention plan.  Services accepted, written consents obtained, and contact information verified.  Left contact information and [facility] literature with patient. Made inpatient Case Manager Tomi Bamberger aware that [facility] Care Management following. Of note, [facility] Care Management services does not replace or interfere with any services that are arranged by inpatient case management or social work.  For additional questions or referrals please contact Anibal Henderson BSN RN [facility] Liaison at [phone].

## 2013-02-15 NOTE — Progress Notes (Signed)
Subjective:  Patient is doing well this morning.  She is tolerating present dose of furosemide.  Her weight is down 2 more pounds since yesterday.  No chest pain, her heart rhythm is stable Objective:  Vital Signs in the last 24 hours: Temp:  [97.8 F (36.6 C)-98.5 F (36.9 C)] 98.3 F (36.8 C) (03/12 0758) Pulse Rate:  [58-60] 60 (03/12 0758) Resp:  [16-18] 16 (03/12 0758) BP: (139-186)/(40-65) 158/63 mmHg (03/12 0758) SpO2:  [96 %-100 %] 98 % (03/12 0758) Weight:  [127 lb 1.6 oz (57.652 kg)] 127 lb 1.6 oz (57.652 kg) (03/12 0400)  Intake/Output from previous day: 03/11 0701 - 03/12 0700 In: 720 [P.O.:720] Out: -  Intake/Output from this shift:    . aspirin  81 mg Oral Daily  . carvedilol  6.25 mg Oral BID WC  . cholecalciferol  1,000 Units Oral Daily  . enoxaparin (LOVENOX) injection  30 mg Subcutaneous Q24H  . felodipine  10 mg Oral Daily  . ferrous sulfate  325 mg Oral Q breakfast  . furosemide  60 mg Oral BID  . insulin aspart  0-5 Units Subcutaneous QHS  . insulin aspart  0-9 Units Subcutaneous TID WC  . insulin glargine  23 Units Subcutaneous Daily  . levothyroxine  75 mcg Oral QAC breakfast  . multivitamin with minerals  1 tablet Oral Daily  . pantoprazole  40 mg Oral BID  . potassium chloride  20 mEq Oral BID  . quinapril  40 mg Oral Daily  . simvastatin  40 mg Oral q1800  . sodium chloride  3 mL Intravenous Q12H  . vitamin B-12  1,000 mcg Oral Daily  . vitamin C  500 mg Oral Daily      Physical Exam: The patient appears to be in no distress.  Head and neck exam reveals that the pupils are equal and reactive. The extraocular movements are full. There is no scleral icterus. Mouth and pharynx are benign. No lymphadenopathy. No carotid bruits. The jugular venous pressure is normal. Thyroid is not enlarged or tender.  Chest is clear to percussion and auscultation. No rales or rhonchi. Expansion of the chest is symmetrical.  Heart reveals no abnormal lift or  heave. First and second heart sounds are normal. There is no gallop rub or click. There is a grade 2/6 systolic ejection murmur at the base.  The abdomen is soft and nontender. Bowel sounds are normoactive. There is no hepatosplenomegaly or mass. There are no abdominal bruits.  Extremities reveal no phlebitis or edema. Pedal pulses are present There is no cyanosis or clubbing.  Neurologic exam is normal strength and no lateralizing weakness. No sensory deficits.  Integument reveals no rash   Lab Results: No results found for this basename: WBC, HGB, PLT,  in the last 72 hours  Recent Labs  02/13/13 0906 02/14/13 0540 02/14/13 1232  NA 136 136  --   K 4.8 5.2*  --   CL 100 101  --   CO2 25 28  --   GLUCOSE 358* 243* 553*  BUN 34* 36*  --   CREATININE 1.53* 1.66*  --    No results found for this basename: TROPONINI, CK, MB,  in the last 72 hours Hepatic Function Panel No results found for this basename: PROT, ALBUMIN, AST, ALT, ALKPHOS, BILITOT, BILIDIR, IBILI,  in the last 72 hours No results found for this basename: CHOL,  in the last 72 hours No results found for this basename: PROTIME,  in the last 72 hours  Imaging: 2D echo: Study Conclusions  - Left ventricle: The cavity size was normal. Systolic function was normal. The estimated ejection fraction was in the range of 60% to 65%. Wall motion was normal; there were no regional wall motion abnormalities. Features are consistent with a pseudonormal left ventricular filling pattern, with concomitant abnormal relaxation and increased filling pressure (grade 2 diastolic dysfunction). - Mitral valve: Calcified annulus. Mildly thickened leaflets . Mild regurgitation. - Right ventricle: The cavity size was mildly dilated. Wall thickness was normal. - Right atrium: The atrium was mildly dilated. - Pulmonary arteries: Systolic pressure was mildly increased. PA peak pressure: 41mm Hg (S). - Pericardium, extracardiac: There was  a left pleural effusion. Impressions:  - Compared to the prior study, there has been no significant interval change.    Cardiac Studies:  telemetry shows normal sinus rhythm. Rare PVC Assessment/Plan Acute on chronic diastolic CHF.        Plan: We will send her home on Lasix 80 mg in the morning and 40 mg in the evening.  HTN Noncoronary chest pain-- quiescent.    Okay for discharge today.  She needs close followup in the office in one week for transition with Dr. Daleen Squibb or PA/NP.  She should get followup BMET at time of office visit.   Nicole [name] 02/15/2013, 8:08 AM

## 2013-02-16 NOTE — Telephone Encounter (Signed)
TCM discharged on 3/12. Called patient to check her status. She states that she feels a little tired but feels this is related to lack of sleep at the hospital. No complaints of SOB. Aware of appointment with Tereso Newcomer PA on 3/19. Taking all medications as ordered.

## 2013-02-22 ENCOUNTER — Other Ambulatory Visit (INDEPENDENT_AMBULATORY_CARE_PROVIDER_SITE_OTHER): Payer: Medicare Other

## 2013-02-22 ENCOUNTER — Emergency Department (HOSPITAL_COMMUNITY)
Admission: EM | Admit: 2013-02-22 | Discharge: 2013-02-22 | Disposition: A | Discharge status: Home / Self Care | Payer: Medicare Other | Attending: Emergency Medicine | Admitting: Emergency Medicine

## 2013-02-22 ENCOUNTER — Ambulatory Visit (INDEPENDENT_AMBULATORY_CARE_PROVIDER_SITE_OTHER): Payer: Medicare Other | Admitting: Physician Assistant

## 2013-02-22 ENCOUNTER — Encounter: Payer: Self-pay | Admitting: Physician Assistant

## 2013-02-22 ENCOUNTER — Telehealth: Payer: Self-pay | Admitting: *Deleted

## 2013-02-22 ENCOUNTER — Encounter (HOSPITAL_COMMUNITY): Payer: Self-pay | Admitting: *Deleted

## 2013-02-22 VITALS — BP 138/46 | HR 60 | Ht 61.0 in | Wt 125.8 lb

## 2013-02-22 DIAGNOSIS — Z862 Personal history of diseases of the blood and blood-forming organs and certain disorders involving the immune mechanism: Secondary | ICD-10-CM | POA: Insufficient documentation

## 2013-02-22 DIAGNOSIS — Z8669 Personal history of other diseases of the nervous system and sense organs: Secondary | ICD-10-CM | POA: Insufficient documentation

## 2013-02-22 DIAGNOSIS — E039 Hypothyroidism, unspecified: Secondary | ICD-10-CM | POA: Insufficient documentation

## 2013-02-22 DIAGNOSIS — Z87891 Personal history of nicotine dependence: Secondary | ICD-10-CM | POA: Insufficient documentation

## 2013-02-22 DIAGNOSIS — I5032 Chronic diastolic (congestive) heart failure: Secondary | ICD-10-CM

## 2013-02-22 DIAGNOSIS — I5022 Chronic systolic (congestive) heart failure: Secondary | ICD-10-CM | POA: Insufficient documentation

## 2013-02-22 DIAGNOSIS — I739 Peripheral vascular disease, unspecified: Secondary | ICD-10-CM

## 2013-02-22 DIAGNOSIS — Z8739 Personal history of other diseases of the musculoskeletal system and connective tissue: Secondary | ICD-10-CM | POA: Insufficient documentation

## 2013-02-22 DIAGNOSIS — I10 Essential (primary) hypertension: Secondary | ICD-10-CM

## 2013-02-22 DIAGNOSIS — Z8679 Personal history of other diseases of the circulatory system: Secondary | ICD-10-CM | POA: Insufficient documentation

## 2013-02-22 DIAGNOSIS — N289 Disorder of kidney and ureter, unspecified: Secondary | ICD-10-CM

## 2013-02-22 DIAGNOSIS — E119 Type 2 diabetes mellitus without complications: Secondary | ICD-10-CM | POA: Insufficient documentation

## 2013-02-22 DIAGNOSIS — Z794 Long term (current) use of insulin: Secondary | ICD-10-CM | POA: Insufficient documentation

## 2013-02-22 DIAGNOSIS — I2510 Atherosclerotic heart disease of native coronary artery without angina pectoris: Secondary | ICD-10-CM | POA: Insufficient documentation

## 2013-02-22 DIAGNOSIS — R899 Unspecified abnormal finding in specimens from other organs, systems and tissues: Secondary | ICD-10-CM

## 2013-02-22 DIAGNOSIS — Z79899 Other long term (current) drug therapy: Secondary | ICD-10-CM | POA: Insufficient documentation

## 2013-02-22 DIAGNOSIS — Z8719 Personal history of other diseases of the digestive system: Secondary | ICD-10-CM | POA: Insufficient documentation

## 2013-02-22 DIAGNOSIS — Z8639 Personal history of other endocrine, nutritional and metabolic disease: Secondary | ICD-10-CM | POA: Insufficient documentation

## 2013-02-22 DIAGNOSIS — R0989 Other specified symptoms and signs involving the circulatory and respiratory systems: Secondary | ICD-10-CM

## 2013-02-22 DIAGNOSIS — E7800 Pure hypercholesterolemia, unspecified: Secondary | ICD-10-CM | POA: Insufficient documentation

## 2013-02-22 DIAGNOSIS — D649 Anemia, unspecified: Secondary | ICD-10-CM | POA: Insufficient documentation

## 2013-02-22 DIAGNOSIS — R6889 Other general symptoms and signs: Secondary | ICD-10-CM | POA: Insufficient documentation

## 2013-02-22 DIAGNOSIS — Z7982 Long term (current) use of aspirin: Secondary | ICD-10-CM | POA: Insufficient documentation

## 2013-02-22 LAB — POCT I-STAT, CHEM 8
Calcium, Ion: 1.11 mmol/L — ABNORMAL LOW (ref 1.13–1.30)
Glucose, Bld: 224 mg/dL — ABNORMAL HIGH (ref 70–99)
HCT: 35 % — ABNORMAL LOW (ref 36.0–46.0)
Hemoglobin: 11.9 g/dL — ABNORMAL LOW (ref 12.0–15.0)
Potassium: 5.2 mEq/L — ABNORMAL HIGH (ref 3.5–5.1)

## 2013-02-22 LAB — BASIC METABOLIC PANEL
Chloride: 103 mEq/L (ref 96–112)
Potassium: 6.1 mEq/L (ref 3.5–5.1)
Sodium: 134 mEq/L — ABNORMAL LOW (ref 135–145)

## 2013-02-22 MED ORDER — CARVEDILOL 6.25 MG PO TABS
6.2500 mg | ORAL_TABLET | Freq: Two times a day (BID) | ORAL | Status: DC
  Administered 2013-02-22: 6.25 mg via ORAL
  Filled 2013-02-22: qty 1

## 2013-02-22 MED ORDER — FUROSEMIDE 20 MG PO TABS
40.0000 mg | ORAL_TABLET | Freq: Two times a day (BID) | ORAL | Status: DC
  Administered 2013-02-22: 40 mg via ORAL
  Filled 2013-02-22: qty 2

## 2013-02-22 MED ORDER — FUROSEMIDE 40 MG PO TABS: ORAL_TABLET | ORAL | Status: DC

## 2013-02-22 NOTE — ED Provider Notes (Signed)
History     CSN: [id number]  Arrival date & time 02/22/13  1735   First MD Initiated Contact with Patient 02/22/13 2045      Chief Complaint  Patient presents with  . Abnormal Lab    (Consider location/radiation/quality/duration/timing/severity/associated sxs/prior treatment) HPI Comments: Patient has followup visit by her cardiologist, today, who drew labs, she was called back and told to report to the emergency department because of a potassium of 6.1.  Since discharge from the hospital.  She has been asymptomatic denying any chest pain, shortness of breath, extremity swelling, nausea, vomiting, dizziness.  She has not taken her nighttime blood pressure medication as of yet  The history is provided by the patient.    Past Medical History  Diagnosis Date  . Retinopathy   . Hypertension   . Chronic diastolic CHF (congestive heart failure)     has a normal EF per echo 03/2011 & 5/2013with diastolic dysfunction  . CAD (coronary artery disease)     Prior PCI in 1998 x 2; s/p cath in 2001, negative Myoview in 2008 & 2011, s/p cath in June 2012 with calcified disease of the proximal LCX with normal flow reserve. She has been managed medically due to her other morbidities  . Cerebrovascular disease   . Peripheral vascular disease     prior angiography showing RSFA stenosis, left anterior tibial and left posterior tibial stenoses  . Hypercholesterolemia   . Diabetes mellitus     insulin dependent  . Hypothyroidism   . Hiatal hernia   . Degenerative joint disease   . Osteoporosis   . Vitamin D deficiency   . Anemia of chronic disease   . Renal insufficiency   . Shortness of breath   . Anginal pain     Past Surgical History  Procedure Laterality Date  . Abdominal aortogram  07/27/2003    by Dr. Chales Abrahams  . Bilateral lower extremity angiogram  07/27/2003    by Dr. Chales Abrahams  . Selective angiography,right superficial femoral artery, right and left iliac arteries  07/27/2003    by Dr.  Chales Abrahams  . Measurement of gradient right and left iliac arteries  07/27/2003    by Dr. Chales Abrahams  . Bilateral cataract sugery  2000 and 2002    by Dr. Cecilie Kicks  . Repair left hip fracture  04/2008    by Dr. Charlann Boxer    Family History  Problem Relation Age of Onset  . Heart disease Mother     History  Substance Use Topics  . Smoking status: Former Smoker    Types: Cigarettes    Quit date: 12/07/1974  . Smokeless tobacco: Never Used     Comment: pt started smoking in 1956  . Alcohol Use: No    OB History   Grav Para Term Preterm Abortions TAB SAB Ect Mult Living                  Review of Systems  Eyes: Negative for visual disturbance.  Respiratory: Negative for cough.   Cardiovascular: Negative for chest pain and leg swelling.  Gastrointestinal: Negative for abdominal pain.  Skin: Negative for pallor.  Neurological: Negative for dizziness, weakness and headaches.  All other systems reviewed and are negative.    Allergies  Ibuprofen and Prednisone  Home Medications   Current Outpatient Rx  Name  Route  Sig  Dispense  Refill  . acetaminophen (TYLENOL) 500 MG tablet   Oral   Take 500 mg by mouth every 6 (  six) hours as needed for pain.         Marland Kitchen aspirin EC 81 MG tablet   Oral   Take 81 mg by mouth daily.         Marland Kitchen b complex vitamins tablet   Oral   Take 1 tablet by mouth daily.         . carvedilol (COREG) 6.25 MG tablet   Oral   Take 1 tablet (6.25 mg total) by mouth 2 (two) times daily with a meal.   180 tablet   3   . Cholecalciferol (VITAMIN D) 1000 UNITS capsule   Oral   Take 1,000 Units by mouth daily.           . felodipine (PLENDIL) 10 MG 24 hr tablet   Oral   Take 1 tablet (10 mg total) by mouth daily.   90 tablet   3   . furosemide (LASIX) 40 MG tablet   Oral   Take 40 mg by mouth 2 (two) times daily.         . insulin glargine (LANTUS) 100 UNIT/ML injection   Subcutaneous   Inject 20 Units into the skin at bedtime.          .  insulin lispro (HUMALOG) 100 UNIT/ML injection   Subcutaneous   Inject 0-10 Units into the skin 3 (three) times daily before meals. Home sliding scale         . levothyroxine (SYNTHROID) 75 MCG tablet   Oral   Take 1 tablet (75 mcg total) by mouth daily.   90 tablet   3   . Multiple Vitamin (MULTIVITAMIN WITH MINERALS) TABS   Oral   Take 1 tablet by mouth daily.         . nitroGLYCERIN (NITROSTAT) 0.4 MG SL tablet   Sublingual   Place 0.4 mg under the tongue every 5 (five) minutes as needed for chest pain.         . pantoprazole (PROTONIX) 40 MG tablet   Oral   Take 1 tablet (40 mg total) by mouth 2 (two) times daily. 30 minutes before breakfast and dinner   180 tablet   3   . potassium chloride SA (K-DUR,KLOR-CON) 20 MEQ tablet   Oral   Take 20 mEq by mouth 2 (two) times daily.         . pravastatin (PRAVACHOL) 80 MG tablet   Oral   Take 80 mg by mouth at bedtime.         . quinapril (ACCUPRIL) 40 MG tablet   Oral   Take 1 tablet (40 mg total) by mouth daily. Hold off on taking this medication until labwork is drawn on 02/22/13.   90 tablet   3   . traMADol (ULTRAM) 50 MG tablet   Oral   Take 50 mg by mouth every 6 (six) hours as needed for pain.         . vitamin B-12 (CYANOCOBALAMIN) 1000 MCG tablet   Oral   Take 1,000 mcg by mouth daily.         . vitamin C (ASCORBIC ACID) 500 MG tablet   Oral   Take 500 mg by mouth daily. To take with iron tablet daily            BP 181/71  Pulse 63  Temp(Src) 97.7 F (36.5 C) (Oral)  Resp 16  SpO2 98%  Physical Exam  Vitals reviewed. Constitutional: She is oriented to person, place,  and time. She appears well-developed and well-nourished.  HENT:  Head: Normocephalic.  Eyes: Pupils are equal, round, and reactive to light.  Neck: Normal range of motion.  Cardiovascular: Normal rate and regular rhythm.   Pulmonary/Chest: Effort normal. No respiratory distress. She has no wheezes. She has no rales.  She exhibits no tenderness.  Abdominal: Soft. She exhibits no distension.  Musculoskeletal: Normal range of motion.  Neurological: She is alert and oriented to person, place, and time.  Skin: Skin is warm and dry. There is pallor.    ED Course  Procedures (including critical care time)  Labs Reviewed  POCT I-STAT, CHEM 8 - Abnormal; Notable for the following:    Potassium 5.2 (*)    BUN 40 (*)    Creatinine, Ser 1.70 (*)    Glucose, Bld 224 (*)    Calcium, Ion 1.11 (*)    Hemoglobin 11.9 (*)    HCT 35.0 (*)    All other components within normal limits   No results found.   1. Hypertension   2. Renal insufficiency   3. Abnormal laboratory test result     ED ECG REPORT   Date: 02/22/2013  EKG Time: 10:32 PM  Rate: 63  Rhythm: normal sinus rhythm,  unchanged from previous tracings  Axis: normal  Intervals:none  ST&T Change: non specific T wave changes   Narrative Interpretation: abnormal but base line             MDM   Repeat potassium shows a potassium of 5.2, which is at baseline for this patient with known renal insufficiency.  I reviewed.  Her labs during her last hospitalization, and this is consistent.  EKG does not show any changes from EKG of March 7. I spoke with Dr. Adolm Joseph covering for him with our cardiology review the labs, EKG, and patient's symptoms, and he agrees that she is to stop taking her potassium to followup by phone with her cardiologist in the morning and to have a repeat lab drawn in the next 2 days Blood pressure.  On arrival, was normal at 130/46 during my examination, it was noted to be 195/47.  I will provide patient with her p.m. blood pressure medications and observe for short period of time        Arman Filter, NP 02/22/13 2232

## 2013-02-22 NOTE — Telephone Encounter (Signed)
Lab called with critical K+ 6.1; Hope from lab states sample not hemolyzed; glucose elevated at 400. Will forward to [facility], PA-C

## 2013-02-22 NOTE — Progress Notes (Signed)
[address] [place], [place]  [address] Phone: [phone], Fax:  [phone]  Date:  02/22/2013   ID:  Nicole [name], Nicole [name] 07/17/1935, MRN [id number]  PCP:  Michele Mcalpine, MD  Primary Cardiologist:  Dr. Valera Castle     History of Present Illness: Nicole [name] is a 78 y.o. female who returns for f/u after a recent admission to the hospital for a/c diastolic CHF.  She has a hx of CAD, s/p prior stenting of the CFX, PAD, diastolic CHF, carotid stenosis, HTN, HL, DM2, hypothyroidism. LHC 4/12: LAD 50-60%, proximal circumflex 90-95%, mid AV groove circumflex 50%, proximal OM 2 stents patent, RCA proximal-mid 70-80%, EF 65%. FFR was 0.97. This was not hemodynamically significant and medical therapy was recommended. Echocardiogram 5/13: EF 65-70%.  ABIs 9/13:  R 0.48, L 0.65, R TBI abnormal and at risk for tissue loss.  She was admitted 3/6-3/12 after presenting to the office with s/s of volume overload and CP.  She improved with IV diuresis. Her weight reduced by 16-17 pounds. Case was reviewed with Dr. Riley Kill in the hospital. Given her heavy calcification in the proximal CFX and FFR > 0.8 on cardiac catheterization in 2012, decision was made to continue medical therapy. Repeat catheterization could be considered if she had continued chest discomfort.  Echo 02/11/13:  EF 60-65%, grade 2 diastolic dysfunction, MAC, mild MR, mild RVE, mild RAE, PASP 41, left pleural effusion.  Since d/c, she continues to feel better.  No CP.  She has decreased DOE.  Now able to ADLs without difficulty.  No orthopnea, PND, LE edema is way down.  No syncope.  Of note, she wants to know what the results were on her ABIs in 08/2012.  She notes constant leg pain bilaterally.  Also, sugars remain high.  She has not seen her PCP yet.   Labs (11/13):  K 4.6, creatinine 1.3, Hgb 11  Wt Readings from Last 3 Encounters:  02/22/13 125 lb 12.8 oz (57.063 kg)  02/15/13 127 lb 1.6 oz (57.652 kg)    02/09/13 144 lb 12.8 oz (65.681 kg)     Past Medical History  Diagnosis Date  . Retinopathy   . Hypertension   . Chronic diastolic CHF (congestive heart failure)     has a normal EF per echo 03/2011 & 5/2013with diastolic dysfunction  . CAD (coronary artery disease)     Prior PCI in 1998 x 2; s/p cath in 2001, negative Myoview in 2008 & 2011, s/p cath in June 2012 with calcified disease of the proximal LCX with normal flow reserve. She has been managed medically due to her other morbidities  . Cerebrovascular disease   . Peripheral vascular disease     prior angiography showing RSFA stenosis, left anterior tibial and left posterior tibial stenoses  . Hypercholesterolemia   . Diabetes mellitus     insulin dependent  . Hypothyroidism   . Hiatal hernia   . Degenerative joint disease   . Osteoporosis   . Vitamin D deficiency   . Anemia of chronic disease   . Renal insufficiency   . Shortness of breath   . Anginal pain     Current Outpatient Prescriptions  Medication Sig Dispense Refill  . acetaminophen (TYLENOL) 500 MG tablet Take 500 mg by mouth every 6 (six) hours as needed for pain.      Marland Kitchen aspirin EC 81 MG tablet Take 81 mg by mouth daily.      Marland Kitchen  b complex vitamins tablet Take 1 tablet by mouth daily.      . carvedilol (COREG) 6.25 MG tablet Take 1 tablet (6.25 mg total) by mouth 2 (two) times daily with a meal.  180 tablet  3  . Cholecalciferol (VITAMIN D) 1000 UNITS capsule Take 1,000 Units by mouth daily.        . felodipine (PLENDIL) 10 MG 24 hr tablet Take 1 tablet (10 mg total) by mouth daily.  90 tablet  3  . furosemide (LASIX) 80 MG tablet Take one tablet (80mg ) in the AM. Take one half tablet (40mg ) in the PM.  45 tablet  3  . insulin glargine (LANTUS) 100 UNIT/ML injection Inject 18 Units into the skin at bedtime.      . insulin lispro (HUMALOG) 100 UNIT/ML injection Inject 0-10 Units into the skin 3 (three) times daily before meals. Home sliding scale      .  levothyroxine (SYNTHROID) 75 MCG tablet Take 1 tablet (75 mcg total) by mouth daily.  90 tablet  3  . Multiple Vitamin (MULTIVITAMIN WITH MINERALS) TABS Take 1 tablet by mouth daily.      . nitroGLYCERIN (NITROSTAT) 0.4 MG SL tablet Place 0.4 mg under the tongue every 5 (five) minutes as needed for chest pain.      . pantoprazole (PROTONIX) 40 MG tablet Take 1 tablet (40 mg total) by mouth 2 (two) times daily. 30 minutes before breakfast and dinner  180 tablet  3  . potassium chloride SA (K-DUR,KLOR-CON) 20 MEQ tablet Take 1 tablet (20 mEq total) by mouth 2 (two) times daily.  60 tablet  3  . pravastatin (PRAVACHOL) 80 MG tablet Take 80 mg by mouth at bedtime.      . traMADol (ULTRAM) 50 MG tablet Take 50 mg by mouth every 6 (six) hours as needed for pain.      . vitamin B-12 (CYANOCOBALAMIN) 1000 MCG tablet Take 1,000 mcg by mouth daily.      . vitamin C (ASCORBIC ACID) 500 MG tablet Take 500 mg by mouth daily. To take with iron tablet daily       . quinapril (ACCUPRIL) 40 MG tablet Take 1 tablet (40 mg total) by mouth daily. Hold off on taking this medication until labwork is drawn on 02/22/13.  90 tablet  3   No current facility-administered medications for this visit.    Allergies:    Allergies  Allergen Reactions  . Ibuprofen     REACTION: edema---thyroid problems  . Prednisone     Causes severe fluid retention    Social History:  The patient  reports that she quit smoking about 38 years ago. Her smoking use included Cigarettes. She smoked 0.00 packs per day. She has never used smokeless tobacco. She reports that she does not drink alcohol or use illicit drugs.   Family History:  The patient's family history includes Heart disease in her mother.   ROS:  Please see the history of present illness.   All other systems reviewed and negative.   PHYSICAL EXAM: VS:  BP 138/46  Pulse 60  Ht 5\' 1"  (1.549 m)  Wt 125 lb 12.8 oz (57.063 kg)  BMI 23.78 kg/m2 Well nourished, well developed,  in no acute distress HEENT: normal Neck: no JVD at 90 degrees Cardiac:  normal S1, S2; RRR; no murmur Lungs:  clear to auscultation bilaterally, no wheezing, rhonchi or rales Abd: soft, nontender, no hepatomegaly Ext: very trace bilateral LE edema Skin: warm  and dry Neuro:  CNs 2-12 intact, no focal abnormalities noted  EKG:  NSR, HR 60, PR 210, somewhat peaked T waves, no change from prior tracing    ASSESSMENT AND PLAN:  1. Chronic Diastolic CHF:  Volume improved.  She feels muck better.  Continue current Rx.  Check BMET today.   2. Renal Insufficiency:  Creatinine increased prior to d/c.  ACE held.  Repeat BMET today and restart ACE if creatinine improved. 3. Coronary Artery Disease:  No further CP.  Medical Rx recommended unless refractory symptoms.  Continue ASA, statin. 4. Hypertension:  Controlled.  Continue current regimen. 5. Diabetes:  Uncontrolled.  Recommend f/u with PCP.  She will call for appt. 6. Peripheral Arterial Disease:  ABIs were quite abnormal.  She has a lot of leg pain.  Will repeat ABIs and refer her to [facility].  Not sure there will be much to offer her with her age and multiple co-morbidities.   7. Disposition:  Refer to [facility].  Follow up with Dr. Valera Castle in 1 month.   Luna Glasgow, PA-C  11:51 AM 02/22/2013

## 2013-02-22 NOTE — Telephone Encounter (Signed)
both pt and her daughter-in-law Eber Jones aware of lab results and pt instructed to go to [facility] ED now due to K+ 6.1, creatinine 1.6, BUN 39, glucose 400, called Melissa charge RN in ED aware.

## 2013-02-22 NOTE — Patient Instructions (Addendum)
Your physician has requested that you have a lower extremity arterial duplex WITH ABI'S DX PAD. This test is an ultrasound of the arteries in the legs or arms. It looks at arterial blood flow in the legs and arms. Allow one hour for Lower and Upper Arterial scans. There are no restrictions or special instructions  You have been referred to [facility] WITH DR. Kirke Corin DX PAD  PLEASE FOLLOW UP WITH SCOTT WEAVER, PAC IN 1 MONTH SAME DAY DR. WALL IS IN THE OFFICE  PLEASE FOLLOW UP WITH YOUR PRIMARY CARE PHYSICIAN FOR DIABETES

## 2013-02-22 NOTE — ED Notes (Addendum)
Pt's BP 199/55, states has not taken evening dose BP meds. Pt reports blurred for 2 days but denies headache, nausea. Pt reports right leg pain.

## 2013-02-22 NOTE — ED Notes (Signed)
Pt went to pcp for recheck following recent admission to hospital for chf, had labs done and was called to come to ED due to K 6.1. No acute distress noted at this time.

## 2013-02-22 NOTE — ED Notes (Signed)
Pt was told not take home BP med coreg and lasix tonight because these meds were given here in the ED.

## 2013-02-23 ENCOUNTER — Telehealth: Payer: Self-pay | Admitting: Cardiology

## 2013-02-23 DIAGNOSIS — I509 Heart failure, unspecified: Secondary | ICD-10-CM

## 2013-02-23 NOTE — Telephone Encounter (Signed)
LMTCB Debbie Connee Ikner RN  

## 2013-02-23 NOTE — ED Provider Notes (Signed)
Medical screening examination/treatment/procedure(s) were performed by non-physician practitioner and as supervising physician I was immediately available for consultation/collaboration.  Gwendolen Hewlett J. Nayelli Inglis, MD 02/23/13 2240 

## 2013-02-23 NOTE — Telephone Encounter (Signed)
Plz return call to patient at hm# [phone] to discuss medical care.

## 2013-02-23 NOTE — Telephone Encounter (Signed)
Pt returns call states she is feeling somewhat weaker today. She stopped her potassium as directed. She hasn't eaten much today states her throat is sore. Will return to office tomorrow for a repeat bmp per ED instructions. She was concerned that her blood pressure had been high upon arrival to ED She is taking all of her other medications. Denies any increased edema or shortness of breath.  Reassurance given.  Will also check blood pressure when pt comes in for lab work. Mylo Red RN

## 2013-02-24 ENCOUNTER — Other Ambulatory Visit (INDEPENDENT_AMBULATORY_CARE_PROVIDER_SITE_OTHER): Payer: Medicare Other

## 2013-02-24 ENCOUNTER — Telehealth: Payer: Self-pay | Admitting: Nurse Practitioner

## 2013-02-24 DIAGNOSIS — I509 Heart failure, unspecified: Secondary | ICD-10-CM

## 2013-02-24 LAB — BASIC METABOLIC PANEL
Calcium: 9 mg/dL (ref 8.4–10.5)
Creatinine, Ser: 1.9 mg/dL — ABNORMAL HIGH (ref 0.4–1.2)

## 2013-02-24 NOTE — Telephone Encounter (Signed)
Pt called in after hours this evening inquiring about the bmet that she had earlier today.  Her creat is up @ 1.9, and K remains elevated @ 5.6.  I reviewed her medications with her.  She is no longer on accupril and potassium was d/c'd the other day.  I recommended that she reduce her lasix to 40mg  daily and increase her fluid intake slightly, as it appears that she may be getting dry and worsened renal fxn may be driving potassium to some extent.  In that setting, I asked that she be sure to weigh herself daily and let us know if her weight is creeping up.  She will require repeat bmet on Monday and she will call the office if she does not hear from someone by mid-day Monday.  I also advised that she avoid potassium rich foods and verbally listed them out for her.  She verbalized understanding of all instructions.

## 2013-02-24 NOTE — Telephone Encounter (Signed)
Blood pressure today is 160/55 heart rate 62 Mylo Red [employer]

## 2013-02-27 ENCOUNTER — Telehealth: Payer: Self-pay | Admitting: Cardiology

## 2013-02-27 NOTE — Telephone Encounter (Signed)
With increased creatinine she should remain OFF of Quinapril for now.  Weigh daily and call if:  Weight up 3 lbs in one day, increased swelling or increased dyspnea.  Plan on rechecking BMET Wednesday as planned.  Tereso Newcomer, PA-C  10:11 AM 02/27/2013

## 2013-02-27 NOTE — Telephone Encounter (Signed)
Pt rtn call from cory from Friday re lab results, pls call [phone]

## 2013-02-27 NOTE — Telephone Encounter (Signed)
m °

## 2013-02-27 NOTE — Telephone Encounter (Signed)
apologized due to called pt in error; pt said that was ok. Nicole [name] s/w pt however pt did not know what she was supposed to do yet about the quinapril; pt advised to hold quinapril until bmet 3/26, pt said ok 

## 2013-02-27 NOTE — Telephone Encounter (Signed)
Pt called for lab wok results. Nicole [name] attempted to call pt to on last Friday to let pt know  lab results and recommendations: Pt is  to decrease the Lasix to 40 mg once a day, to decreased the dietary intake of  Potassium. Pt is aware of recommendation.  Pt states will come on Wednesday 03/01/13 to check  BMET. Pt would like to know when she can re-start the Quinapril 40 mg daily.

## 2013-02-27 NOTE — Telephone Encounter (Signed)
apologized due to called pt in error; pt said that was ok. Nicole [name] s/w pt however pt did not know what she was supposed to do yet about the quinapril; pt advised to hold quinapril until bmet 3/26, pt said ok

## 2013-02-28 ENCOUNTER — Telehealth: Payer: Self-pay | Admitting: Cardiovascular Disease

## 2013-02-28 ENCOUNTER — Ambulatory Visit (INDEPENDENT_AMBULATORY_CARE_PROVIDER_SITE_OTHER): Payer: Medicare Other | Admitting: *Deleted

## 2013-02-28 DIAGNOSIS — I10 Essential (primary) hypertension: Secondary | ICD-10-CM

## 2013-02-28 LAB — BASIC METABOLIC PANEL
GFR: 35.89 mL/min — ABNORMAL LOW (ref >60.00)
Potassium: 4.1 mEq/L (ref 3.5–5.1)
Sodium: 134 mEq/L — ABNORMAL LOW (ref 135–145)

## 2013-02-28 NOTE — Telephone Encounter (Signed)
Lab results were given to pt. 

## 2013-02-28 NOTE — Telephone Encounter (Signed)
Follow Up   Pt called in to follow up on her bloodwork results.

## 2013-03-01 ENCOUNTER — Other Ambulatory Visit: Payer: Medicare Other

## 2013-03-01 NOTE — Telephone Encounter (Signed)
New problem    When should she resume taken Accupril  40 mg.

## 2013-03-01 NOTE — Telephone Encounter (Signed)
After Lorin Picket reviewed lab results recommended pt continue to hold quinapril. Continue to weigh daily report weight gain of 3 lbs I spoke with pt and she will continue to hold medication. She weighs daily and understands CHF protocol.  She is taking her furosemide 40mg  daily.  Mylo Red RN

## 2013-03-06 ENCOUNTER — Ambulatory Visit: Payer: Medicare Other | Admitting: Cardiology

## 2013-03-07 ENCOUNTER — Ambulatory Visit: Payer: Medicare Other | Admitting: Cardiovascular Disease

## 2013-03-19 DIAGNOSIS — I10 Essential (primary) hypertension: Secondary | ICD-10-CM

## 2013-03-19 DIAGNOSIS — E119 Type 2 diabetes mellitus without complications: Secondary | ICD-10-CM

## 2013-03-19 DIAGNOSIS — N289 Disorder of kidney and ureter, unspecified: Secondary | ICD-10-CM

## 2013-03-19 DIAGNOSIS — I2510 Atherosclerotic heart disease of native coronary artery without angina pectoris: Secondary | ICD-10-CM

## 2013-03-23 ENCOUNTER — Ambulatory Visit: Payer: Medicare Other | Admitting: Physician Assistant

## 2013-03-27 ENCOUNTER — Encounter: Payer: Self-pay | Admitting: Pulmonary Disease

## 2013-03-28 ENCOUNTER — Ambulatory Visit: Payer: Medicare Other | Admitting: Cardiovascular Disease

## 2013-04-05 ENCOUNTER — Ambulatory Visit: Payer: Medicare Other | Admitting: Physician Assistant

## 2013-04-13 ENCOUNTER — Telehealth: Payer: Self-pay | Admitting: Internal Medicine

## 2013-04-13 MED ORDER — CIPROFLOXACIN HCL 250 MG PO TABS: 250.0000 mg | ORAL_TABLET | Freq: Two times a day (BID) | ORAL | Status: AC

## 2013-04-13 NOTE — Telephone Encounter (Signed)
Per SN---  Call in cipro 250 mg  #14  1 po bid until gone. thanks 

## 2013-04-13 NOTE — Telephone Encounter (Signed)
Spoke with patient -- Patient states she has an UTI x 1 day She says her urine has an odor and burns "like crazy", also states when  "I have to go I have to go" Patient states she has had this before, and is requesting something to be called in to her pharmacy Dr. Kriste Basque please advise, thank you!  Last OV:11.20.13 Next OV:5.21.14  Allergies  Allergen Reactions  . Ibuprofen     REACTION: edema---thyroid problems  . Prednisone     Causes severe fluid retention

## 2013-04-13 NOTE — Telephone Encounter (Signed)
Spoke with patient, made her aware of rec's per SN Verified pharmacy-- [name]--Rx sent Nothing further needed at this time

## 2013-04-18 ENCOUNTER — Ambulatory Visit: Payer: Medicare Other | Admitting: Cardiovascular Disease

## 2013-04-18 ENCOUNTER — Ambulatory Visit: Payer: Medicare Other | Admitting: Physician Assistant

## 2013-04-26 ENCOUNTER — Ambulatory Visit (INDEPENDENT_AMBULATORY_CARE_PROVIDER_SITE_OTHER): Payer: Medicare Other | Admitting: Pulmonary Disease

## 2013-04-26 ENCOUNTER — Encounter: Payer: Self-pay | Admitting: Pulmonary Disease

## 2013-04-26 VITALS — BP 134/62 | HR 60 | Temp 98.2°F | Ht 62.0 in | Wt 135.0 lb

## 2013-04-26 DIAGNOSIS — I739 Peripheral vascular disease, unspecified: Secondary | ICD-10-CM

## 2013-04-26 DIAGNOSIS — E1142 Type 2 diabetes mellitus with diabetic polyneuropathy: Secondary | ICD-10-CM

## 2013-04-26 DIAGNOSIS — E7800 Pure hypercholesterolemia, unspecified: Secondary | ICD-10-CM

## 2013-04-26 DIAGNOSIS — E1149 Type 2 diabetes mellitus with other diabetic neurological complication: Secondary | ICD-10-CM

## 2013-04-26 DIAGNOSIS — I5032 Chronic diastolic (congestive) heart failure: Secondary | ICD-10-CM

## 2013-04-26 DIAGNOSIS — K219 Gastro-esophageal reflux disease without esophagitis: Secondary | ICD-10-CM

## 2013-04-26 DIAGNOSIS — E039 Hypothyroidism, unspecified: Secondary | ICD-10-CM

## 2013-04-26 DIAGNOSIS — D638 Anemia in other chronic diseases classified elsewhere: Secondary | ICD-10-CM

## 2013-04-26 DIAGNOSIS — N186 End stage renal disease: Secondary | ICD-10-CM | POA: Insufficient documentation

## 2013-04-26 DIAGNOSIS — I10 Essential (primary) hypertension: Secondary | ICD-10-CM

## 2013-04-26 DIAGNOSIS — E108 Type 1 diabetes mellitus with unspecified complications: Secondary | ICD-10-CM

## 2013-04-26 DIAGNOSIS — M810 Age-related osteoporosis without current pathological fracture: Secondary | ICD-10-CM

## 2013-04-26 DIAGNOSIS — K449 Diaphragmatic hernia without obstruction or gangrene: Secondary | ICD-10-CM

## 2013-04-26 DIAGNOSIS — M1990 Unspecified osteoarthritis, unspecified site: Secondary | ICD-10-CM

## 2013-04-26 DIAGNOSIS — I2510 Atherosclerotic heart disease of native coronary artery without angina pectoris: Secondary | ICD-10-CM

## 2013-04-26 DIAGNOSIS — N289 Disorder of kidney and ureter, unspecified: Secondary | ICD-10-CM

## 2013-04-26 MED ORDER — INSULIN GLARGINE 100 UNIT/ML ~~LOC~~ SOLN: 20.0000 [IU] | Freq: Every day | SUBCUTANEOUS | Status: DC

## 2013-04-26 NOTE — Progress Notes (Signed)
Subjective:    Patient ID: Nicole [name], female    DOB: [birth year], 78 y.o.   MRN: [id number]  HPI 78 y/o WF here for a follow up visit... Nicole [name] has mult med problems including IDDM w/ hx poor control;  HBP;  CAD followed by DrWall;  Cerebrovasc & Peripheral vasc dis;  Hyperlipidemia;  Hypothyroidism;  Large HH;  DJD w/ neck & LBP;  Osteoporosis & Vit D defic;  Hx Anemia...  ~  February 29, 2012:  38mo ROV & Nicole [name] has mult somatic complaints> c/o sinusitis w/ drainage/ cough/ hoarse, drainage wakes her at night, no energy, incr SOB, etc;  Also notes legs hurt, tender, "my legs are hard", hurts to walk, more sedentary; DrWall started Neurontin 100mg  Tid for prob neuropathy related to her DM;  Husb passed away 12/12 w/ severe dementia, pneumonia, UTI, in hosp 3wks...    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    HBP> controlled on Coreg6.25Bid, Plendil10, Quinapril40, Lasix20Bid; BP= 126/68 & similar at home Nicole [name] says...     CAD> Nicole [name] had extensive Cards eval by DrStuckey & DrWall; Caths 4/12 & 6/12 showed worrisome lesion in proxCFX- they decided on medical therapy (see below, & DrWall's notes are reviewed); Nicole [name] denies angina, palpit, ch in chr DOE, edema, etc...    ASPVD> CDopplers 7/12 stable, no cerebral ischemic symptoms; stable PAD w/o claud & known sm vessel dis distally, no LE lesions etc...    CHOL> on Prav40 now per DrWall, plus diet, not fasting today; FLP 9/12 on Lip40 showed TChol 186, TG 125, HDL 51, LDL 110    DM> on MWNUUV25 +SSI Humalog5-15Tid w/ BS up & down at home ==> FBS=83 & A1c=9.4    Hypothy> stable on Levothy75...    GI> back on Protonix 40mg /d...    DJD>  Still having lots of back pain & followed by Mayford Knife; on Tramadol prn & Neurontin 100Tid added by DrWall for leg pain, prob neuropathy...    Anemia> Nicole [name] is off her prev iron therapy but still taking lots of Vits etc; Hg=9.4, Fe=30, B12=215, SPE= ok... LABS 3/25:  Chems-  ok x BS=83 A1c=9.4 BUN=31 Creat=1.3 BNP=265;  CBC- Hg=9.4 Fe=30(8%), B12=215;  SPE/IEP- neg no monoclonal prot & Ig's ok...  ~  June 28, 2012:  76mo ROV & Nicole [name] has been seen by Cards x4 recently for Rx of her CHF> diuresed 23# of fluid betw checks by Karlyne Greenspan & DrWall, now back on Lasix40mg Qam w/ BNP=213 & wt= 138# (down 4# from 3/13 OV); her wt at home was 133# & Nicole [name] was instructed to incr the Lasix 40==>80 if her wt was over 132# (we reviewed these recs from Cards today)...     Nicole [name] is also c/o pain in her back & hips and wants f/u appt w/ DrOlin due to the pain & poss of THR; Nicole [name] will try TRAMADOL 50mg /d in the interim...    Finally Nicole [name] noted c/o hair loss 7 Nicole [name] has started on Biotin per her beautician; I have rec f/u w/ her Derm... We reviewed prob list, meds, xrays and labs> see below>> LABS 7/13:  FLP- not at goal on Prav80 per DrWall;  Chems- ok x BS=153 A1c=11.2;  CBC- ok x Hg=11.0 Fe=68;  TSH=2.62;  BNP=213;  B12=481   ~  October 26, 2012:  76mo ROV & Nicole [name] continues to have difficulty w/ her DM control noting LOW sugars betw 3-6AM no matter when Nicole [name]  takes he Lantus; reminded that the Humalog would more likely cause low sugars & Nicole [name] follows sliding scale but has been unable to improve her parameters sufficiently to reduce her A1c; previously managed by endocrine but Nicole [name] refused to continue w/ them; Nicole [name] has repeatedly refused offers to re-establish w/ DM specialist; Todays labs showed BS=364, A1c=11.1 & I have insisted that Nicole [name] see another Endocrinologist & we will refer to DrGherghe- Nicole [name] would be interested in an insulin pump if this would be appropriate for her.    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    HBP> controlled on Coreg6.25Bid, Plendil10, Quinapril40, Lasix40Qam; BP= 138/60 & similar at home Nicole [name] says...     CAD> Nicole [name] had extensive Cards eval by DrStuckey & DrWall; Caths 4/12 & 6/12 showed worrisome lesion in proxCFX- they decided  on medical therapy (see below, & DrWall's notes are reviewed); Nicole [name] denies angina, palpit, ch in chr DOE, edema, etc...    ASPVD> CDopplers 8/13 stable (40-59% bilat ICA stenoses), no cerebral ischemic symptoms; Nicole [name] is very sedentary w/ claud symptoms if Nicole [name] walks too far, and Nicole [name] has progressive PAD as noted on her Dopplers per DrWall...    CHOL> on Prav80 now per DrWall + diet rx; last FLP 7/13 showed TChol 216, TG 96, HDL 63, LDL 130 & DrWall aware; Nicole [name] is requesting to return to CRES20- ok.    DM> on Lantus20-40 +SSI Humalog5-15Tid w/ BS up & down at home ==> Labs today are worse w/ BS=364, A1c=11.1 and we discussed the NEED to return to Endocrinology for their expertise, poss insulin pump; we will refer to DrGherghe...    Hypothy> stable on Levothy75...    GI> back on Protonix 40mg Bid & Nicole [name] is asymptomatic...    DJD>  Still having lots of back pain & followed by [facility]; on Tramadol prn & off prev Neurontin tried for prob neuropathy...    Anemia> Nicole [name] still taking lots of Vits etc; plus Fe/ VitC and Hg=11.0, Fe=68 (24%sat)... We reviewed prob list, meds, xrays and labs> see below for updates >>  LABS 11/13:  Chems- ok x BS=364 A1c=11.1 BUN=37 Creat=1.3;  CBC- ok w/ Hg=11.0 Fe=68 (24%sat);  B12=434     ~  Apr 26, 2013:  71mo ROV & Nicole [name] saw DrGherghe, [facility] for DM consult 12/13> Type1 uncontrolled w/ complic including retinopathy, neuropathy, CKD, CVD- CAD & DiastolicCHF, PAD; Nicole [name] adjusted her Lantus & Lispro regimen (says Nicole [name] was NOT interested in pump once Nicole [name] heard what it entailed); Nicole [name] was to return in 65month but never went back (?why)... 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    HBP> on Coreg6.25Bid, Plendil10, Lasix40Bid (& a 3rd pill if wt is up), (off prev Accupril40 since 3/14 Hosp); BP= 134/62 & similar at home Nicole [name] says; denies CP, palpit, ch in  SOB or edema...    CAD> Nicole [name] had extensive Cards eval by DrStuckey & DrWall; Caths 4/12 & 6/12 showed worrisome lesion in proxCFX- they decided on medical therapy (see below, & DrWall's notes are reviewed); Nicole [name] denies angina, palpit, ch in chr DOE, ch in edema, etc...    Diastolic CHF> Nicole [name] was [facility] 3/14 for acute on chronic diastolic CHF> diuresed 17# & improved; now on Lasix40Bid but takes a third pill if her weight is up; not currently  on KCl & labs 5/14 showed K=4.5, BUN=36, Cr=1.4    ASPVD> on ASA81; CDopplers 8/13 stable (40-59% bilat ICA stenoses), no cerebral ischemic symptoms; Nicole [name] is very sedentary w/ claud symptoms if Nicole [name] walks too far, and Nicole [name] has progressive PAD as noted on her Dopplers per DrWall...    CHOL> on Prav80 now per DrWall + diet rx; FLP 5/14 showed TChol 270, TG 184, HDL 53, LDL 161 & DrWall aware; Nicole [name] was given options & chooses to change to CRESTOR10    DM> on Lantus20Qhs +SSI Humalog0-10Tid w/ BS up & down at home ==> Labs today are worse w/ BS=379, A1c=11.3 and we discussed the NEED to return to Endocrinology for their management of her brittle Type1 DM...    Hypothy> on Levothy75; labs 5/14  w/ TSH= 1.73    GI> back on Protonix 40mg Qd & Nicole [name] is asymptomatic...    DJD>  Still having lots of back pain & hip pain- followed by Mayford Knife; on Tramadol prn & off prev Neurontin (tried for neuropathy)...    Anemia> Nicole [name] still taking lots of Vits etc; plus Fe/ VitC and Hg=11.1, MCV=90, last Fe=68 (24%sat) on 7/13... We reviewed prob list, meds, xrays and labs> see below for updates >>  LABS 5/14:  FLP- not at goals on Prav40;  Chems- K=4.5 BUN=36 Cr=1.4 BS=379 A1c=11.3 Alb=2.8;  CBC- Hg=11.1;  TSH=1.73;  VitD=29 & rec to incr to 2000u daily supplement...           Problem List:  RETINOPATHY (ICD-363.20) - followed at [facility] DrKarup (we don't have notes)... 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  HYPERTENSION (ICD-401.9) >>   ~  on COREG 6.25mg Bid, QUINAPRIL 40mg /d, FELODIPINE 10mg /d & LASIX 40mg /d...  ~  9/12: BP= 142/60 & 120-130 at home; Nicole [name] denies CP, palpit, change in dyspnea, PND, etc... ~  3/13: BP= 126/68 & similar at home; Nicole [name] is too sedentary but denies CP, palpit, edema... ~  7/13:  BP= 158/64 & didn't take meds today, reminded to take meds daily, same time each day; BUN=27, Creat=0.9, BNP=213... ~  11/13:  BP= 138/60 & Nicole [name] denies current CP, palpit, ch in SOB, edema... ~  5/14: on Coreg6.25Bid, Plendil10, Lasix40Qam (off prev Accupril40 since 3/14 Hosp); BP= 134/62 & similar at home Nicole [name] says; denies CP, palpit, ch in SOB or edema.  CAD (ICD-414.00) &  CHRONIC DIASTOLIC CHF - on MEDS above... DrWall's notes are reviewed- denies recurrent angina, palpit, change in dyspnea or edema... we reviewed risk factor reduction strategy... ~  Prior stent in marginal branch of Circ in 1998 ~  Myoview 4/10 was negative- no scar or ischemia, EF= 73%... ~  labs 3/11 showed BUN=18, Crear=1.0, BNP=107, & EKG essent WNL.Marland Kitchen. ~  4/12:  Hosp w/ Diastolic CHF after Pred Rx and fluid retention; diuresed & improved... ~  2DEcho 4/12 showed EF 65-70% with normal wall motion & grade 2 DD, mildMR, mild LAdil... ~  Cath 4/12 showed  50% midLAD, 90% calcified stenosis in proxCIRC, patent stent in second obtuse marginal, 70-80% proxRCA (non-dominent, sm vessel), EF=65%, LVEDP=8mmHg. ~  [facility] 6/12 for re-cath by DrStuckey> He discussed results in detail with the patient, explaining that Nicole [name] "did not have evidence of USAP, with an FFR of 0.96> medical therapy was recommended". ~  Nicole [name] continues regular follow up visits w/ DrWall for management... ~  3/14:  Nicole [name] was Adm 3/6-12/14 by Cards w/ acute on chr diastolic  CHF & diuresed 17# w/ considerable improvement, ACE was held due to incr Creat (1.6-1.9)... 2DEcho showed norm LV size & function w/ EF=60-65% & norm wall motion; Gr2DD, mildMR, RV mildly dilated w/ PAsys=41... Now taking-  Coreg6.25Bid, Plendil10, Lasix40Bid (& a 3rd pill if wt is up)...  CEREBROVASCULAR DISEASE (ICD-437.9) - on ASA daily... no cerebral ischemic symptoms. ~  CDoppler 4/08 showing stable bilat carotid dis w/ 40-59% bilat stenoses... ~  repeat studies 4/09 showed mild smooth heterogeneous plaque w/ 40-59% bilat ICA stenoses. ~  f/u studies 4/10 showed mild to mod heterogen plaque bilat- 40-59% bilat stenoses, stable. ~  CDopplers 5/11 showed stable mild irreg plaque, 40-59% bilat ICAstenoses, no change. ~  CDopplers 7/12 showed stable mild heterogen plaque bilat w/ 40-59% bilat ICA stenoses; f/u planned 69yr... ~  CDopplers 8/13 showed stable mild heterogen plaque bilat w/ 40-59% bilat ICA stenoses, vertebral antegrade; f/u 78yr.  PERIPHERAL VASCULAR DISEASE (ICD-443.9) - abnormal ABI's and eval by DrDowney in 3/08 (& prev DrGupta 2004)... Nicole [name] had arteriogram w/ bilat dis but no indication for PTA... 20% bilat ostial renal lesions also noted... no complaints of claudication etc, ambulates w/ cane, BP controlled. ~  LE Vasc study 4/10 showed known diffuse bilat arterial occlusive dis- stable large vessel dis & progressive sm vessel dis w/ toe/ brachial indicies at risk for tissue loss... DrWall is following. ~  LE Vasc study 9/13 showed >50% right CFA stenosis extending into the prox PFA & SFA; interval occlusion of mid right SFA w/ reconstitutionvia collaterals in the distal SFA; right ABI is worse at 0.48; left ABI is sl worse at 0.65; right TBI is at risk for tissue loss at 0.19 (DrWall sent copies to [employer]).  HYPERCHOLESTEROLEMIA (ICD-272.0) >>  ~  FLP 3/08 showed TChol 158, Tg 119, HDL 43, LDL 92 ~  FLP 3/09 showed TChol 173, TG 151, HDL 41, LDL 102 ~  FLP 1/10 off med x 57mo showed TChol 349, TG 173, HDL 45, LDL 258... rec change Statin, but Nicole [name] prefers Cres20. ~  FLP 5/10 on Cres20 showed TChol 179, TG 113, HDL 52, LDL 104... rec> continue Cres20. ~  FLP 3/11 on Cres20 showed TChol 179, TG 125,  HDL 50, LDL 104... Nicole [name] would not incr dose & wanted off Cres due to $$ & aching. ~  FLP 10/12 on Lip40 showed TChol 186, TG 125, HDL 51, LDL 110... Nicole [name] will not incr the dose. ~  DrWall switched her to PRAVASTATIN 40mg  taking 1.5 tab Qd ==> then 2 tabs daily. ~  FLP 7/13 on Prav80 per DrWall showed TChol 216, TG 96, HDL 63, LDL 130... Not at goals, copy sent to Cards. ~  11/13:  Nicole [name] is requested to go back on Crestor but ultimately decided to continue Prav80... ~  FLP 5/15 on Prav80 showed TChol 270, TG 184, HDL 53, LDL 161... Nicole [name] d3ecided to change to CRESTOR10  DIABETES MELLITUS (ICD-250.00) - very brittle DM, insulin dependent and prev Rx by Endocrinology... Nicole [name] has retinopathy, neuropathy, and nephropathy w/ proteinuria noted (prev on ACE & Felodipine)... we have discussed adjustments to her insulin regimen designed to improve her overall control without resulting hypoglycemia but Nicole [name] has been reluctant to comply due to hypoglycemia... ~  labs 9/08 showed BS= 171, HgA1c= 8.7 ~  labs 3/09 showed BS= 195, HgA1c= 8.6.Marland KitchenMarland Kitchen rec to slowly incr her insulin doses. ~  labs 6/09 showed BS= 287, HgA1c= 8.4.Marland KitchenMarland Kitchen offered Endocrine referral but Nicole [name] refuses, rec incr Lantus & Humalog. ~  labs 1/10 showed BS= 255, HgA1c= 9.4.Marland Kitchen. rec> incr Lantus and Humalog slowly. ~  labs 5/10 showed BS= 264, A1c= 8.5.Marland KitchenMarland Kitchen still needs incr insulin. ~  labs 9/10 showed BS= 220, A1c= 8.1 ~  labs 3/11 showed BS= 181, A1c= 9.0..Marland Kitchen Nicole [name] is reluctant to incr insulin, rec to switch to LEVEMIR 40u/d (Nicole [name] was intol). ~  labs 9/11 showed BS= 257, A1c= 9.1.Marland KitchenMarland Kitchen rec slowly incr Lantus by 2u increments until FBS is <150... ~  Labs 10/12 on Lantus40+SSHumalog showed BS= 149, A1c= 9.4.... Nicole [name] refuses to ret to Endocrine consultant. ~  Labs 3/13 showed BS= 83, A1c= 9.4... Once again asked to incr Lantus by 2u increments until FBS is <150... ~  Labs 7/13 on Lantus40+SSHumalog showed BS= 153, A1c= 11.2... Nicole [name] is admonished to incr the Lantus dose but  states unable due to brittle DM numbers. ~  Labs 11/13 on Lantus20-40+SSHumalog5-15Tid showed BS=364 A1c=11.1 and I insisted on her return to Endocrine/ DM specialist for management, poss insulin pump. ~  12/13:  Nicole [name] saw DrGherghe [facility] for DM consult> Type1 uncontrolled w/ complic including retinopathy, neuropathy, CKD, CVD- CAD & DiastolicCHF, PAD; Nicole [name] adjusted her Lantus & Lispro regimen (says Nicole [name] was NOT interested in pump once Nicole [name] heard what it entailed); Nicole [name] was to return in 70month but never went back (?why) ~  5/14:  Labs show BS=379, A1c=11.3, and Nicole [name] has not kept appts for DM management from Endocrinology => asked to return ASAP to re-establish w/ DrGherghe...  HYPOTHYROIDISM (ICD-244.9) - on SYNTHROID 30mcg/d...   ~  labs 3/08 showed TSH = 2.0 ~  labs 3/09 showed TSH = 2.93 ~  labs 5/10 showed TSH = 3.46 ~  labs 3/11 showed TSH= 3.15 ~  Labs 4/12 showed TSH= 3.70 ~  Labs 3/13 showed TSH= 5.69... Reminded to take Qam on empty stomach... ~  Labs 7/13 showed TSH= 2.62 ~  Labs 5/14 on Levothy75 showed TSH= 1.73  HIATAL HERNIA (ICD-553.3) - Nicole [name] is back on PROTONIX 40mg  Bid... ~  EGD 12/03 showed 5cmHH, tortuous & spastic esop, chr atrophic gastritis... Nexium recommended. ~  colonoscopy 12/03 was WNL x for decr rectal tone noted... incr fiber recommended.  DEGENERATIVE JOINT DISEASE (ICD-715.90) - "my back's in bad shape" per DrOlin & Shon Baton- nothing can be done surgically to help her... ~  5/09: fall w/ left hip fx- Rx DrOlin w/ closed reduction & percut screw fixation... Nicole [name]'s been told Nicole [name] needs THR. ~  Back eval DrRamos 12/09 w/ epid steroid shot 1/10 without much benefit... ~  6/11:  Nicole [name] reports repeat epid shots by DrRamos helped somewhat & Nicole [name] will f/u w/ him... ~  9/11:  c/o hurting all over & TRAMADOL 50mg  Qid Prn tried... ~  7/13:  Nicole [name] is c/o back & hip pain; Tramadol refilled; asking to see DrOlin again... ~  11/13:  Nicole [name] reports recent eval from [facility] & he suspects  Neuropathy; rec shoe inserts etc... ~  5/14:  Nicole [name] has persistent back & hip discomfort- treated w/ Tramadol & Tylenol...  OSTEOPOROSIS (ICD-733.00), & VITAMIN D DEFICIENCY (ICD-268.9) - followed by GYN= DrTomblin w/ BMD 12/09 per Nicole [name] ?results... not on bisphos Rx- Nicole [name] takes Calcium, Vits, & had Vit D defic w/ level= 8 per Nicole [name] hx treated transiently w/ 50K weekly- now on 1000 u daily & level pending at DrTomblin's office.  ANEMIA OF CHRONIC DISEASE (ICD-285.29) - on FeSO4, Vit C, Vit B12... ~  labs 9/08 showed Hg= 11.3  ~  labs 3/09 showed  Hg= 11.2 ~  labs 5/10 showed Hg= 11.4 ~  labs 3/11 showed Hg= 11.6 ~  Labs 6/12 showed Hg= 9.6 ~  Labs 3/13 showed Hg= 9.4, Fe=30 (8%) ~  Labs 7/13 showed Hg= 11.0, Fe= 68 (23%sat), B12= 481... ~  Labs 5/14 showed Hg= 11.1  HEALTH MAINTENANCE: ~  GI:  Followed by DrPatterson for GI but hasn't seen him in many yrs; known large HH on PPI Bid, prev EGD w/ dil in 2003; ?when her last colon was done? ~  GYN:  Nicole [name] is followed by DrTomblin; seen 6/13 for GYN check up & Nicole [name] reports doing well, everything neg... ~  Immuniz:  Nicole [name] gets the yearly Flu vaccine each fall; Nicole [name] received the Pneumovax in 2010;     Past Surgical History  Procedure Laterality Date  . Abdominal aortogram  07/27/2003    by Dr. Chales Abrahams  . Bilateral lower extremity angiogram  07/27/2003    by Dr. Chales Abrahams  . Selective angiography,right superficial femoral artery, right and left iliac arteries  07/27/2003    by Dr. Chales Abrahams  . Measurement of gradient right and left iliac arteries  07/27/2003    by Dr. Chales Abrahams  . Bilateral cataract sugery  2000 and 2002    by Dr. Cecilie Kicks  . Repair left hip fracture  04/2008    by Dr. Charlann Boxer    Outpatient Encounter Prescriptions as of 04/26/2013  Medication Sig Dispense Refill  . acetaminophen (TYLENOL) 500 MG tablet Take 500 mg by mouth every 6 (six) hours as needed for pain.      Marland Kitchen aspirin EC 81 MG tablet Take 81 mg by mouth daily.      Marland Kitchen b complex vitamins tablet Take  1 tablet by mouth daily.      . carvedilol (COREG) 6.25 MG tablet Take 1 tablet (6.25 mg total) by mouth 2 (two) times daily with a meal.  180 tablet  3  . Cholecalciferol (VITAMIN D) 1000 UNITS capsule Take 1,000 Units by mouth daily.        . felodipine (PLENDIL) 10 MG 24 hr tablet Take 1 tablet (10 mg total) by mouth daily.  90 tablet  3  . furosemide (LASIX) 40 MG tablet Take 40 mg by mouth 2 (two) times daily.      . insulin glargine (LANTUS) 100 UNIT/ML injection Inject 20 Units into the skin at bedtime.       . insulin lispro (HUMALOG) 100 UNIT/ML injection Inject 0-10 Units into the skin 3 (three) times daily before meals. Home sliding scale      . levothyroxine (SYNTHROID) 75 MCG tablet Take 1 tablet (75 mcg total) by mouth daily.  90 tablet  3  . Multiple Vitamin (MULTIVITAMIN WITH MINERALS) TABS Take 1 tablet by mouth daily.      . nitroGLYCERIN (NITROSTAT) 0.4 MG SL tablet Place 0.4 mg under the tongue every 5 (five) minutes as needed for chest pain.      . pantoprazole (PROTONIX) 40 MG tablet Take 40 mg by mouth daily. 30 minutes before breakfast and dinner      . pravastatin (PRAVACHOL) 80 MG tablet Take 80 mg by mouth at bedtime.      . traMADol (ULTRAM) 50 MG tablet Take 50 mg by mouth every 6 (six) hours as needed for pain.      . vitamin B-12 (CYANOCOBALAMIN) 1000 MCG tablet Take 1,000 mcg by mouth daily.      . vitamin C (ASCORBIC ACID) 500 MG tablet  Take 500 mg by mouth daily. To take with iron tablet daily       . [DISCONTINUED] pantoprazole (PROTONIX) 40 MG tablet Take 1 tablet (40 mg total) by mouth 2 (two) times daily. 30 minutes before breakfast and dinner  180 tablet  3  . [DISCONTINUED] potassium chloride SA (K-DUR,KLOR-CON) 20 MEQ tablet Take 20 mEq by mouth 2 (two) times daily.      . [DISCONTINUED] quinapril (ACCUPRIL) 40 MG tablet Take 1 tablet (40 mg total) by mouth daily. Hold off on taking this medication until labwork is drawn on 02/22/13.  90 tablet  3   No  facility-administered encounter medications on file as of 04/26/2013.    Allergies  Allergen Reactions  . Ibuprofen     REACTION: edema---thyroid problems  . Prednisone     Causes severe fluid retention    Current Medications, Allergies, Past Medical History, Past Surgical History, Family History, and Social History were reviewed in [employer] record.   Review of Systems   Constitutional:  Denies F/C/S, anorexia, unexpected weight change. HEENT:  Occas HAs; but no visual changes, earache, nasal symptoms, sore throat, hoarseness. Resp:  No cough, sputum, hemoptysis; no ch DOE, & no tightness or wheezing. Cardio:  No CP, palpit, orthopnea, edema. GI:  Denies N/V/D/C or blood in stool; no reflux, abd pain, distention, or gas. GU:  No dysuria, freq, urgency, hematuria, or flank pain. MS:  Mod joint pains, w/ mild swelling, tenderness, & decr ROM... Neuro:  No tremors, seizures, dizziness, syncope; sl weakness & multifactorial gait abn. Skin:  No suspicious lesions or skin rash. Heme:  No adenopathy, bruising, bleeding. Psyche: Denies confusion, sleep disturbance, hallucinations; mod anxiety caring for husb (Alz).   Objective:   Physical Exam   WD, WN, 78 y/o WF in NAD; Nicole [name] is chr ill appearing... Vital Signs:  Reviewed...BP= 126/60 General:  Alert & oriented; pleasant & cooperative... HEENT:  [place]/AT, EOM-wnl, PERRLA, EACs-clear, TMs-wnl, NOSE-clear, THROAT-clear & wnl. Neck:  Sl tender w/ decr ROM; no JVD; normal carotid impulses w/o bruits; no thyromegaly or nodules palpated; no lymphadenopathy. Chest:  Clear to P & A; without wheezes/ rales/ or rhonchi heard... Heart:  Regular Rhythm; norm S1 & S2, & gr 1/6 SEM w/o rubs or gallops detected... Abdomen:  Soft & nontender; normal bowel sounds; no organomegaly or masses palpated... Ext:  Decr ROM; +heberden's etc & mod arthritic changes; no varicose veins, +venous insuffic, tr edema;  Pulses intact w/o  bruits... Neuro:  CNs II-XII intact; motor testing normal; sensory testing normal; abn gait & balance only fair... Derm:  No lesions noted; no rash etc... Lymph:  No cervical, supraclavicular, axillary, or inguinal adenopathy palpated...   RADIOLOGY DATA:  Reviewed in the EPIC EMR & discussed w/ the patient...  LABORATORY DATA:  Reviewed in the EPIC EMR & discussed w/ the patient...   Assessment & Plan:    DM> as above, on Lantus/ Humalog w/ BS up & down at home ==> FBS=379 & A1c=11.3; Nicole [name] did not f/u w/ endocrine & we will refer back to DrGherghe...  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     HBP, Diastolic CHF> controlled on Coreg6.25Bid, Plendil10, Lasix40- 2-3 daily; continue current meds & f/u w/ Cards...     CAD> Nicole [name] had extensive Cards eval by DrStuckey & DrWall; Caths 4/12 & 6/12 showed worrisome lesion in proxCFX- they decided on medical therapy (see above, & DrWalls  notes are reviewed); Nicole [name] denies angina, palpit, ch in chr DOE, edema, etc; Nicole [name] had DD last yr and vigorous diuresis by Cards w/ 23# fluid reduction & improved...     ASPVD> CDopplers 8/13 stable, no cerebral ischemic symptoms; known severe PAD w/ interval worsening on Dopplers and managed by DrWall...     CHOL> on Prav80 but not at goals and Nicole [name] wants to change back to CRESTOR 10mg /d...        Hypothy> stable on Levothy75...     GI> back on Protonix 40mg  Bid...Marland KitchenMarland KitchenMarland Kitchen     DJD>  Still having lots of back pain & followed by Mayford Knife; on Tramadol prn...     Anemia> Improved back on prev iron therapy; still taking lots of Vits etc; rec to continue FeSO4, Vit C & Vit B12 101mcg/d.Marland KitchenMarland Kitchen

## 2013-04-26 NOTE — Patient Instructions (Addendum)
Today we updated your med list in our EPIC system...    Continue your current medications the same...  Please return to our lab one morning this week for your follow up FASTING blood work...    We will contact you w/ the results when available...   At your convenience> contact DrGherghe for a Diabetes follow up visit...  Call for any questions...  Let's plan a follow up visit in 16mo, sooner if needed for problems.Marland KitchenMarland Kitchen

## 2013-04-27 ENCOUNTER — Other Ambulatory Visit (INDEPENDENT_AMBULATORY_CARE_PROVIDER_SITE_OTHER): Payer: Medicare Other

## 2013-04-27 DIAGNOSIS — M810 Age-related osteoporosis without current pathological fracture: Secondary | ICD-10-CM

## 2013-04-27 DIAGNOSIS — E039 Hypothyroidism, unspecified: Secondary | ICD-10-CM

## 2013-04-27 DIAGNOSIS — I10 Essential (primary) hypertension: Secondary | ICD-10-CM

## 2013-04-27 DIAGNOSIS — E538 Deficiency of other specified B group vitamins: Secondary | ICD-10-CM

## 2013-04-27 DIAGNOSIS — E108 Type 1 diabetes mellitus with unspecified complications: Secondary | ICD-10-CM

## 2013-04-27 DIAGNOSIS — E7800 Pure hypercholesterolemia, unspecified: Secondary | ICD-10-CM

## 2013-04-27 DIAGNOSIS — D638 Anemia in other chronic diseases classified elsewhere: Secondary | ICD-10-CM

## 2013-04-27 LAB — CBC WITH DIFFERENTIAL/PLATELET
Eosinophils Relative: 7.5 % — ABNORMAL HIGH (ref 0.0–5.0)
HCT: 32.2 % — ABNORMAL LOW (ref 36.0–46.0)
Lymphs Abs: 1.4 10*3/uL (ref 0.7–4.0)
Monocytes Relative: 7.2 % (ref 3.0–12.0)
Platelets: 236 10*3/uL (ref 150.0–400.0)
WBC: 7.4 10*3/uL (ref 4.5–10.5)

## 2013-04-27 LAB — BASIC METABOLIC PANEL
BUN: 36 mg/dL — ABNORMAL HIGH (ref 6–23)
Calcium: 8.6 mg/dL (ref 8.4–10.5)
GFR: 37.61 mL/min — ABNORMAL LOW (ref >60.00)
Glucose, Bld: 379 mg/dL — ABNORMAL HIGH (ref 70–99)
Potassium: 4.5 mEq/L (ref 3.5–5.1)

## 2013-04-27 LAB — LIPID PANEL
Cholesterol: 270 mg/dL — ABNORMAL HIGH (ref 0–200)
HDL: 52.7 mg/dL (ref >39.00)
Triglycerides: 184 mg/dL — ABNORMAL HIGH (ref 0.0–149.0)

## 2013-04-27 LAB — TSH: TSH: 1.73 u[IU]/mL (ref 0.35–5.50)

## 2013-04-27 LAB — LDL CHOLESTEROL, DIRECT: Direct LDL: 160.7 mg/dL

## 2013-04-27 LAB — VITAMIN B12: Vitamin B-12: 291 pg/mL (ref 211–911)

## 2013-04-27 LAB — HEPATIC FUNCTION PANEL
AST: 18 U/L (ref 0–37)
Albumin: 2.8 g/dL — ABNORMAL LOW (ref 3.5–5.2)

## 2013-04-28 ENCOUNTER — Other Ambulatory Visit: Payer: Self-pay | Admitting: *Deleted

## 2013-04-28 ENCOUNTER — Telehealth: Payer: Self-pay | Admitting: Pulmonary Disease

## 2013-04-28 ENCOUNTER — Other Ambulatory Visit: Payer: Self-pay | Admitting: Pulmonary Disease

## 2013-04-28 LAB — VITAMIN D 25 HYDROXY (VIT D DEFICIENCY, FRACTURES): Vit D, 25-Hydroxy: 29 ng/mL — ABNORMAL LOW (ref 30–89)

## 2013-04-28 MED ORDER — ROSUVASTATIN CALCIUM 10 MG PO TABS: 10.0000 mg | ORAL_TABLET | Freq: Every day | ORAL | Status: DC

## 2013-04-28 NOTE — Telephone Encounter (Signed)
I spoke with the pt and she states that Leigh sent a prescription to [facility] for her insulin for #2vials. Pt states she was getting # 2 vials for $60 but now she states they are going to charge her an extra $15 for the second vial and she states this is because it is for a 90 day supply. I did some calculations and according to directions the pt would need 2 vials to last her 90 days so I am not sure what the issue is. I called [facility] at [phone] and spoke with pharmacists. They advised that we sent the rx correctly so the change in price must be a change in the pt formulary. They stated that we could give the pt 3 vials for a 90 day supply with the same directions for $75. So I called the pt back and advised her that she could get 3 vials for the same price so this comes out cheaper for her. Pt states this would be good. I called [facility] and gave verbal for the new quantity of 3 vials same directions. Pt is aware. Carron Curie, CMA

## 2013-05-04 ENCOUNTER — Other Ambulatory Visit: Payer: Self-pay | Admitting: Pulmonary Disease

## 2013-05-04 DIAGNOSIS — E119 Type 2 diabetes mellitus without complications: Secondary | ICD-10-CM

## 2013-05-22 ENCOUNTER — Ambulatory Visit: Payer: Medicare Other | Admitting: Internal Medicine

## 2013-05-22 DIAGNOSIS — Z0289 Encounter for other administrative examinations: Secondary | ICD-10-CM

## 2013-06-07 ENCOUNTER — Ambulatory Visit (INDEPENDENT_AMBULATORY_CARE_PROVIDER_SITE_OTHER): Payer: Medicare Other | Admitting: Cardiology

## 2013-06-07 ENCOUNTER — Encounter: Payer: Self-pay | Admitting: Cardiology

## 2013-06-07 VITALS — BP 132/62 | HR 60 | Ht 62.0 in | Wt 135.0 lb

## 2013-06-07 DIAGNOSIS — I679 Cerebrovascular disease, unspecified: Secondary | ICD-10-CM

## 2013-06-07 DIAGNOSIS — I739 Peripheral vascular disease, unspecified: Secondary | ICD-10-CM

## 2013-06-07 DIAGNOSIS — R609 Edema, unspecified: Secondary | ICD-10-CM

## 2013-06-07 DIAGNOSIS — I10 Essential (primary) hypertension: Secondary | ICD-10-CM

## 2013-06-07 DIAGNOSIS — E1149 Type 2 diabetes mellitus with other diabetic neurological complication: Secondary | ICD-10-CM

## 2013-06-07 DIAGNOSIS — I509 Heart failure, unspecified: Secondary | ICD-10-CM

## 2013-06-07 DIAGNOSIS — E1142 Type 2 diabetes mellitus with diabetic polyneuropathy: Secondary | ICD-10-CM

## 2013-06-07 DIAGNOSIS — I2510 Atherosclerotic heart disease of native coronary artery without angina pectoris: Secondary | ICD-10-CM

## 2013-06-07 DIAGNOSIS — R600 Localized edema: Secondary | ICD-10-CM

## 2013-06-07 DIAGNOSIS — E108 Type 1 diabetes mellitus with unspecified complications: Secondary | ICD-10-CM

## 2013-06-07 DIAGNOSIS — I5032 Chronic diastolic (congestive) heart failure: Secondary | ICD-10-CM

## 2013-06-07 NOTE — Assessment & Plan Note (Signed)
Good control. No change in medical therapy. She can no longer taking ACE or an ARB because of hyperkalemia and worsening renal function.

## 2013-06-07 NOTE — Assessment & Plan Note (Addendum)
Stable. No change in current medical therapy. Close followup in 3 months with Dr Antoine Poche.

## 2013-06-07 NOTE — Patient Instructions (Addendum)
Your physician has requested that you have a carotid duplex in August same day as lower extremity duplex that was ordered in March. This test is an ultrasound of the carotid arteries in your neck. It looks at blood flow through these arteries that supply the brain with blood. Allow one hour for this exam. There are no restrictions or special instructions.  Medications:   Do not take Quinapril  Your physician recommends that you schedule a follow-up appointment in: 3 months with Dr. Antoine Poche.

## 2013-06-07 NOTE — Assessment & Plan Note (Signed)
Awful control. Followup with Dr. Lennon Alstrom.

## 2013-06-07 NOTE — Assessment & Plan Note (Signed)
Stable. Continue secondary preventative therapy. Followup lipids with Dr. Lennon Alstrom. She will most likely need 40 mg of Crestor.

## 2013-06-07 NOTE — Assessment & Plan Note (Signed)
Stable. No change in medical therapy. 

## 2013-06-07 NOTE — Assessment & Plan Note (Signed)
Arrange carotid Dopplers in August.

## 2013-06-07 NOTE — Assessment & Plan Note (Signed)
Stable and improved. No change in medical therapy.

## 2013-06-07 NOTE — Progress Notes (Signed)
HPI Nicole [name] comes in today for evaluation and management of her complex cardiovascular history and comorbidities. She was admitted to the hospital in March with acute on chronic diastolic congestive heart failure. She has not been readmitted. Her creatinine was up to 1.9 her potassium was 6.1 recently and her ACE inhibitor was discontinued.  She denies any angina or ischemic symptoms. Her weight has been stable. She denies any edema.  Recent labs show total cholesterol 270, triglycerides 184, HDL 52, LDL was 160. Hemoglobin A1c was 11.3%. She says she's compliant. Dr. Kriste Basque also checked her TSH was normal. She was switched from Pravachol to Crestor 10 mg a day. She is due to follow blood work in a couple weeks.  Past Medical History  Diagnosis Date  . Retinopathy   . Hypertension   . Chronic diastolic CHF (congestive heart failure)     has a normal EF per echo 03/2011 & 5/2013with diastolic dysfunction  . CAD (coronary artery disease)     Prior PCI in 1998 x 2; s/p cath in 2001, negative Myoview in 2008 & 2011, s/p cath in June 2012 with calcified disease of the proximal LCX with normal flow reserve. She has been managed medically due to her other morbidities  . Cerebrovascular disease   . Peripheral vascular disease     prior angiography showing RSFA stenosis, left anterior tibial and left posterior tibial stenoses  . Hypercholesterolemia   . Diabetes mellitus     insulin dependent  . Hypothyroidism   . Hiatal hernia   . Degenerative joint disease   . Osteoporosis   . Vitamin D deficiency   . Anemia of chronic disease   . Renal insufficiency   . Shortness of breath   . Anginal pain     Current Outpatient Prescriptions  Medication Sig Dispense Refill  . acetaminophen (TYLENOL) 500 MG tablet Take 500 mg by mouth every 6 (six) hours as needed for pain.      Marland Kitchen aspirin EC 81 MG tablet Take 81 mg by mouth daily.      Marland Kitchen b complex vitamins tablet Take 1 tablet by mouth daily.      .  carvedilol (COREG) 6.25 MG tablet Take 1 tablet (6.25 mg total) by mouth 2 (two) times daily with a meal.  180 tablet  3  . Cholecalciferol (VITAMIN D) 1000 UNITS capsule Take 1,000 Units by mouth daily.        . felodipine (PLENDIL) 10 MG 24 hr tablet Take 1 tablet (10 mg total) by mouth daily.  90 tablet  3  . furosemide (LASIX) 40 MG tablet Take 40 mg by mouth 2 (two) times daily. An extra tablet if needed      . insulin glargine (LANTUS) 100 UNIT/ML injection Inject 0.2 mLs (20 Units total) into the skin at bedtime.  2 vial  3  . insulin lispro (HUMALOG) 100 UNIT/ML injection Inject 0-10 Units into the skin 3 (three) times daily before meals. Home sliding scale      . levothyroxine (SYNTHROID) 75 MCG tablet Take 1 tablet (75 mcg total) by mouth daily.  90 tablet  3  . Multiple Vitamin (MULTIVITAMIN WITH MINERALS) TABS Take 1 tablet by mouth daily.      . nitroGLYCERIN (NITROSTAT) 0.4 MG SL tablet Place 0.4 mg under the tongue every 5 (five) minutes as needed for chest pain.      . pantoprazole (PROTONIX) 40 MG tablet Take 40 mg by mouth daily. 30 minutes before  breakfast and dinner      . rosuvastatin (CRESTOR) 10 MG tablet Take 1 tablet (10 mg total) by mouth daily.  30 tablet  6  . traMADol (ULTRAM) 50 MG tablet Take 50 mg by mouth every 6 (six) hours as needed for pain.      . vitamin B-12 (CYANOCOBALAMIN) 1000 MCG tablet Take 1,000 mcg by mouth daily.      . vitamin C (ASCORBIC ACID) 500 MG tablet Take 500 mg by mouth daily. To take with iron tablet daily        No current facility-administered medications for this visit.    Allergies  Allergen Reactions  . Ibuprofen     REACTION: edema---thyroid problems  . Prednisone     Causes severe fluid retention    Family History  Problem Relation Age of Onset  . Heart disease Mother     History   Social History  . Marital Status: Married    Spouse Name: Llana Aliment. Erekson    Number of Children: 2  . Years of Education: N/A    Occupational History  . retired    Social History Main Topics  . Smoking status: Former Smoker    Types: Cigarettes    Quit date: 12/07/1974  . Smokeless tobacco: Never Used     Comment: pt started smoking in 1956  . Alcohol Use: No  . Drug Use: No  . Sexually Active: No   Other Topics Concern  . Not on file   Social History Narrative   Married to [employer]   2 children---son has kidney and pancrease transplant    ROS ALL NEGATIVE EXCEPT THOSE NOTED IN HPI  PE  General Appearance: well developed, well nourished in no acute distress, frail HEENT: symmetrical face, PERRLA, good dentition  Neck: no JVD, thyromegaly, or adenopathy, trachea midline Chest: symmetric without deformity Cardiac: PMI non-displaced, RRR, normal S1, S2, no gallop or murmur Lung: clear to ausculation and percussion Vascular: No carotid bruits, diminished pulses in lower extremities which is baseline. Abdominal: nondistended, nontender, good bowel sounds, no HSM, no bruits Extremities: no cyanosis, clubbing or edema, no sign of DVT, no varicosities  Skin: normal color, no rashes Neuro: alert and oriented x 3, non-focal Pysch: normal affect  EKG Normal sinus rhythm, nonspecific T wave changes, stable BMET    Component Value Date/Time   NA 135 04/27/2013 0751   K 4.5 04/27/2013 0751   CL 101 04/27/2013 0751   CO2 24 04/27/2013 0751   GLUCOSE 379* 04/27/2013 0751   BUN 36* 04/27/2013 0751   CREATININE 1.4* 04/27/2013 0751   CALCIUM 8.6 04/27/2013 0751   GFRNONAA 28* 02/15/2013 0624   GFRAA 33* 02/15/2013 0624    Lipid Panel     Component Value Date/Time   CHOL 270* 04/27/2013 0751   TRIG 184.0* 04/27/2013 0751   HDL 52.70 04/27/2013 0751   CHOLHDL 5 04/27/2013 0751   VLDL 36.8 04/27/2013 0751   LDLCALC 110* 09/23/2011 0839    CBC    Component Value Date/Time   WBC 7.4 04/27/2013 0751   RBC 3.60* 04/27/2013 0751   HGB 11.1* 04/27/2013 0751   HCT 32.2* 04/27/2013 0751   PLT 236.0 04/27/2013  0751   MCV 89.5 04/27/2013 0751   MCH 30.4 02/09/2013 1230   MCHC 34.3 04/27/2013 0751   RDW 15.7* 04/27/2013 0751   LYMPHSABS 1.4 04/27/2013 0751   MONOABS 0.5 04/27/2013 0751   EOSABS 0.6 04/27/2013 0751   BASOSABS 0.0  04/27/2013 0751      

## 2013-06-13 ENCOUNTER — Telehealth: Payer: Self-pay | Admitting: Pulmonary Disease

## 2013-06-13 NOTE — Telephone Encounter (Signed)
Per SN---  Only thing to do is leave it off for a month to see if the symptoms improve.  Pt is aware to call us back and let us know if this gets better.  Pt is aware to follow a strict  Low chol/low fat diet.  Nothing further is needed.

## 2013-06-13 NOTE — Telephone Encounter (Signed)
I spoke with pt. She c/o muscles from hips down her muscles aches/burns x 1 week. She has been on crestor 10 mg daily x 6 weeks now. She stated at night the symptoms are worse. Please advise SN thanks Last OV 04/26/13 Pending 10/24/13 Allergies  Allergen Reactions  . Ibuprofen     REACTION: edema---thyroid problems  . Prednisone     Causes severe fluid retention

## 2013-07-11 ENCOUNTER — Encounter (INDEPENDENT_AMBULATORY_CARE_PROVIDER_SITE_OTHER): Payer: Medicare Other

## 2013-07-11 DIAGNOSIS — I679 Cerebrovascular disease, unspecified: Secondary | ICD-10-CM

## 2013-07-11 DIAGNOSIS — I6529 Occlusion and stenosis of unspecified carotid artery: Secondary | ICD-10-CM

## 2013-07-12 ENCOUNTER — Other Ambulatory Visit: Payer: Self-pay | Admitting: Nurse Practitioner

## 2013-07-17 ENCOUNTER — Other Ambulatory Visit: Payer: Self-pay | Admitting: Nurse Practitioner

## 2013-07-19 ENCOUNTER — Telehealth: Payer: Self-pay | Admitting: Cardiology

## 2013-07-19 NOTE — Telephone Encounter (Signed)
PT AWARE OF CAROTID RESULTS ./CY 

## 2013-07-19 NOTE — Telephone Encounter (Signed)
New problem   Pt calling about her carotid test results. Please call pt

## 2013-08-09 ENCOUNTER — Ambulatory Visit: Payer: Medicare Other | Admitting: Cardiology

## 2013-08-11 ENCOUNTER — Other Ambulatory Visit: Payer: Self-pay | Admitting: Pulmonary Disease

## 2013-08-11 MED ORDER — TRAMADOL HCL 50 MG PO TABS: 50.0000 mg | ORAL_TABLET | Freq: Four times a day (QID) | ORAL | Status: DC | PRN

## 2013-08-11 NOTE — Telephone Encounter (Signed)
Received faxed refill request from [facility] for Tramadol 50mg   #100, 1 po q6h prn Last ov 5.21.14, follow up in 6 months  Refill telephoned to pharmacy with 0 additional refills to pharmacist Mae Med list updated Will sign off

## 2013-08-12 ENCOUNTER — Emergency Department (HOSPITAL_COMMUNITY): Payer: Medicare Other

## 2013-08-12 ENCOUNTER — Encounter (HOSPITAL_COMMUNITY): Payer: Self-pay | Admitting: Radiology

## 2013-08-12 ENCOUNTER — Inpatient Hospital Stay (HOSPITAL_COMMUNITY)
Admission: EM | Admit: 2013-08-12 | Discharge: 2013-08-22 | DRG: 291 | Disposition: A | Discharge status: Home / Self Care | Payer: Medicare Other | Attending: Internal Medicine | Admitting: Internal Medicine

## 2013-08-12 DIAGNOSIS — I739 Peripheral vascular disease, unspecified: Secondary | ICD-10-CM

## 2013-08-12 DIAGNOSIS — E559 Vitamin D deficiency, unspecified: Secondary | ICD-10-CM

## 2013-08-12 DIAGNOSIS — E871 Hypo-osmolality and hyponatremia: Secondary | ICD-10-CM | POA: Diagnosis not present

## 2013-08-12 DIAGNOSIS — K449 Diaphragmatic hernia without obstruction or gangrene: Secondary | ICD-10-CM

## 2013-08-12 DIAGNOSIS — I679 Cerebrovascular disease, unspecified: Secondary | ICD-10-CM

## 2013-08-12 DIAGNOSIS — I2510 Atherosclerotic heart disease of native coronary artery without angina pectoris: Secondary | ICD-10-CM

## 2013-08-12 DIAGNOSIS — E119 Type 2 diabetes mellitus without complications: Secondary | ICD-10-CM

## 2013-08-12 DIAGNOSIS — R079 Chest pain, unspecified: Secondary | ICD-10-CM

## 2013-08-12 DIAGNOSIS — Z9861 Coronary angioplasty status: Secondary | ICD-10-CM

## 2013-08-12 DIAGNOSIS — I5033 Acute on chronic diastolic (congestive) heart failure: Secondary | ICD-10-CM

## 2013-08-12 DIAGNOSIS — I509 Heart failure, unspecified: Secondary | ICD-10-CM

## 2013-08-12 DIAGNOSIS — K551 Chronic vascular disorders of intestine: Secondary | ICD-10-CM

## 2013-08-12 DIAGNOSIS — Z794 Long term (current) use of insulin: Secondary | ICD-10-CM

## 2013-08-12 DIAGNOSIS — M1990 Unspecified osteoarthritis, unspecified site: Secondary | ICD-10-CM

## 2013-08-12 DIAGNOSIS — N183 Chronic kidney disease, stage 3 unspecified: Secondary | ICD-10-CM

## 2013-08-12 DIAGNOSIS — N289 Disorder of kidney and ureter, unspecified: Secondary | ICD-10-CM

## 2013-08-12 DIAGNOSIS — E039 Hypothyroidism, unspecified: Secondary | ICD-10-CM

## 2013-08-12 DIAGNOSIS — I5032 Chronic diastolic (congestive) heart failure: Secondary | ICD-10-CM

## 2013-08-12 DIAGNOSIS — I10 Essential (primary) hypertension: Secondary | ICD-10-CM

## 2013-08-12 DIAGNOSIS — D649 Anemia, unspecified: Secondary | ICD-10-CM

## 2013-08-12 DIAGNOSIS — I774 Celiac artery compression syndrome: Secondary | ICD-10-CM

## 2013-08-12 DIAGNOSIS — I498 Other specified cardiac arrhythmias: Secondary | ICD-10-CM | POA: Diagnosis not present

## 2013-08-12 DIAGNOSIS — Z87891 Personal history of nicotine dependence: Secondary | ICD-10-CM

## 2013-08-12 DIAGNOSIS — N2581 Secondary hyperparathyroidism of renal origin: Secondary | ICD-10-CM | POA: Diagnosis present

## 2013-08-12 DIAGNOSIS — I5031 Acute diastolic (congestive) heart failure: Secondary | ICD-10-CM

## 2013-08-12 DIAGNOSIS — E1142 Type 2 diabetes mellitus with diabetic polyneuropathy: Secondary | ICD-10-CM

## 2013-08-12 DIAGNOSIS — I701 Atherosclerosis of renal artery: Secondary | ICD-10-CM | POA: Diagnosis present

## 2013-08-12 DIAGNOSIS — N179 Acute kidney failure, unspecified: Secondary | ICD-10-CM

## 2013-08-12 DIAGNOSIS — I70209 Unspecified atherosclerosis of native arteries of extremities, unspecified extremity: Secondary | ICD-10-CM | POA: Diagnosis present

## 2013-08-12 DIAGNOSIS — M810 Age-related osteoporosis without current pathological fracture: Secondary | ICD-10-CM

## 2013-08-12 DIAGNOSIS — D638 Anemia in other chronic diseases classified elsewhere: Secondary | ICD-10-CM

## 2013-08-12 DIAGNOSIS — K219 Gastro-esophageal reflux disease without esophagitis: Secondary | ICD-10-CM

## 2013-08-12 DIAGNOSIS — E108 Type 1 diabetes mellitus with unspecified complications: Secondary | ICD-10-CM

## 2013-08-12 DIAGNOSIS — E7800 Pure hypercholesterolemia, unspecified: Secondary | ICD-10-CM

## 2013-08-12 DIAGNOSIS — N186 End stage renal disease: Secondary | ICD-10-CM | POA: Diagnosis present

## 2013-08-12 DIAGNOSIS — I130 Hypertensive heart and chronic kidney disease with heart failure and stage 1 through stage 4 chronic kidney disease, or unspecified chronic kidney disease: Principal | ICD-10-CM | POA: Diagnosis present

## 2013-08-12 DIAGNOSIS — H3090 Unspecified chorioretinal inflammation, unspecified eye: Secondary | ICD-10-CM

## 2013-08-12 DIAGNOSIS — R600 Localized edema: Secondary | ICD-10-CM

## 2013-08-12 LAB — POCT I-STAT, CHEM 8
BUN: 46 mg/dL — ABNORMAL HIGH (ref 6–23)
Calcium, Ion: 1.11 mmol/L — ABNORMAL LOW (ref 1.13–1.30)
Chloride: 102 mEq/L (ref 96–112)
Creatinine, Ser: 2 mg/dL — ABNORMAL HIGH (ref 0.50–1.10)
Glucose, Bld: 374 mg/dL — ABNORMAL HIGH (ref 70–99)
HCT: 28 % — ABNORMAL LOW (ref 36.0–46.0)
Hemoglobin: 9.5 g/dL — ABNORMAL LOW (ref 12.0–15.0)
Potassium: 3.6 mEq/L (ref 3.5–5.1)
Sodium: 137 mEq/L (ref 135–145)
TCO2: 21 mmol/L (ref 0–100)

## 2013-08-12 LAB — CBC
HCT: 26.5 % — ABNORMAL LOW (ref 36.0–46.0)
Hemoglobin: 9.2 g/dL — ABNORMAL LOW (ref 12.0–15.0)
MCH: 30.6 pg (ref 26.0–34.0)
MCHC: 34.7 g/dL (ref 30.0–36.0)
MCV: 88 fL (ref 78.0–100.0)
Platelets: 231 10*3/uL (ref 150–400)
RBC: 3.01 MIL/uL — ABNORMAL LOW (ref 3.87–5.11)
RDW: 13 % (ref 11.5–15.5)
WBC: 7.4 10*3/uL (ref 4.0–10.5)

## 2013-08-12 LAB — GLUCOSE, CAPILLARY: Glucose-Capillary: 280 mg/dL — ABNORMAL HIGH (ref 70–99)

## 2013-08-12 MED ORDER — LEVOTHYROXINE SODIUM 75 MCG PO TABS
75.0000 ug | ORAL_TABLET | Freq: Every day | ORAL | Status: DC
  Administered 2013-08-13 – 2013-08-22 (×10): 75 ug via ORAL
  Filled 2013-08-12 (×11): qty 1

## 2013-08-12 MED ORDER — INSULIN GLARGINE 100 UNIT/ML ~~LOC~~ SOLN
20.0000 [IU] | Freq: Every day | SUBCUTANEOUS | Status: DC
  Administered 2013-08-12: 22:00:00 20 [IU] via SUBCUTANEOUS
  Filled 2013-08-12 (×2): qty 0.2

## 2013-08-12 MED ORDER — CARVEDILOL 6.25 MG PO TABS
6.2500 mg | ORAL_TABLET | Freq: Two times a day (BID) | ORAL | Status: DC
  Administered 2013-08-12 – 2013-08-13 (×2): 6.25 mg via ORAL
  Filled 2013-08-12 (×5): qty 1

## 2013-08-12 MED ORDER — ASPIRIN EC 81 MG PO TBEC
81.0000 mg | DELAYED_RELEASE_TABLET | Freq: Every day | ORAL | Status: DC
  Administered 2013-08-13 – 2013-08-22 (×10): 81 mg via ORAL
  Filled 2013-08-12 (×10): qty 1

## 2013-08-12 MED ORDER — INSULIN ASPART 100 UNIT/ML ~~LOC~~ SOLN: 0.0000 [IU] | Freq: Three times a day (TID) | SUBCUTANEOUS | Status: DC

## 2013-08-12 MED ORDER — SODIUM CHLORIDE 0.9 % IJ SOLN
3.0000 mL | Freq: Two times a day (BID) | INTRAMUSCULAR | Status: DC
  Administered 2013-08-13 – 2013-08-14 (×2): 3 mL via INTRAVENOUS

## 2013-08-12 MED ORDER — NITROGLYCERIN 2 % TD OINT
0.5000 [in_us] | TOPICAL_OINTMENT | Freq: Three times a day (TID) | TRANSDERMAL | Status: DC
  Administered 2013-08-12 – 2013-08-18 (×17): 0.5 [in_us] via TOPICAL
  Filled 2013-08-12 (×6): qty 30
  Filled 2013-08-12: qty 1
  Filled 2013-08-12 (×10): qty 30

## 2013-08-12 MED ORDER — ALUM & MAG HYDROXIDE-SIMETH 200-200-20 MG/5ML PO SUSP: 30.0000 mL | Freq: Four times a day (QID) | ORAL | Status: DC | PRN

## 2013-08-12 MED ORDER — SODIUM CHLORIDE 0.9 % IJ SOLN: 3.0000 mL | INTRAMUSCULAR | Status: DC | PRN

## 2013-08-12 MED ORDER — SODIUM CHLORIDE 0.9 % IJ SOLN
3.0000 mL | Freq: Two times a day (BID) | INTRAMUSCULAR | Status: DC
  Administered 2013-08-12 – 2013-08-17 (×9): 3 mL via INTRAVENOUS

## 2013-08-12 MED ORDER — SODIUM CHLORIDE 0.9 % IV SOLN: 250.0000 mL | INTRAVENOUS | Status: DC | PRN

## 2013-08-12 MED ORDER — PANTOPRAZOLE SODIUM 40 MG PO TBEC
40.0000 mg | DELAYED_RELEASE_TABLET | Freq: Every day | ORAL | Status: DC
  Administered 2013-08-13 – 2013-08-22 (×10): 40 mg via ORAL
  Filled 2013-08-12 (×10): qty 1

## 2013-08-12 NOTE — ED Provider Notes (Addendum)
Medical screening examination/treatment/procedure(s) were conducted as a shared visit with non-physician practitioner(s) and myself.  I personally evaluated the patient during the encounter  Results for orders placed during the hospital encounter of 08/12/13  CBC      Result Value Range   WBC 7.4  4.0 - 10.5 K/uL   RBC 3.01 (*) 3.87 - 5.11 MIL/uL   Hemoglobin 9.2 (*) 12.0 - 15.0 g/dL   HCT 16.1 (*) 09.6 - 04.5 %   MCV 88.0  78.0 - 100.0 fL   MCH 30.6  26.0 - 34.0 pg   MCHC 34.7  30.0 - 36.0 g/dL   RDW 40.9  81.1 - 91.4 %   Platelets 231  150 - 400 K/uL  TROPONIN I      Result Value Range   Troponin I <0.30  <0.30 ng/mL  OCCULT BLOOD, POC DEVICE      Result Value Range   Fecal Occult Bld NEGATIVE  NEGATIVE  POCT I-STAT, CHEM 8      Result Value Range   Sodium 137  135 - 145 mEq/L   Potassium 3.6  3.5 - 5.1 mEq/L   Chloride 102  96 - 112 mEq/L   BUN 46 (*) 6 - 23 mg/dL   Creatinine, Ser 7.82 (*) 0.50 - 1.10 mg/dL   Glucose, Bld 956 (*) 70 - 99 mg/dL   Calcium, Ion 2.13 (*) 1.13 - 1.30 mmol/L   TCO2 21  0 - 100 mmol/L   Hemoglobin 9.5 (*) 12.0 - 15.0 g/dL   HCT 08.6 (*) 57.8 - 46.9 %   Dg Chest 2 View  08/12/2013   *RADIOLOGY REPORT*  Clinical Data: Short of breath  CHEST - 2 VIEW  Comparison: 02/12/2013  Findings: Normal cardiac silhouette.  There is fine interstitial pattern with a linear markings laterally.  Breast tissue node on the left and right.  There is blunting of the costophrenic angles slightly improved compared to prior.  IMPRESSION: Small bilateral pleural effusions and interstitial edema.   Original Report Authenticated By: Genevive Bi, M.D.    Lab results above. Patient with low persistent hypertension but no hypertensive emergency. Patient presented with shortness of breath some nausea vomiting and diaphoresis chest pain at the 4:00 today. Improved with 2 nitroglycerin patient has a cardiac history. Patient's nontoxic no acute distress right now chest x-rays  negative for pulmonary edema. Her EKG does show some ST segment depression out laterally. Discussed with hospitalist admitting team and they will admit.  Shelda Jakes, MD 08/12/13 2022  Shelda Jakes, MD 08/12/13 2034

## 2013-08-12 NOTE — ED Notes (Signed)
Patient transported to X-ray 

## 2013-08-12 NOTE — ED Notes (Signed)
Report given to floor rn.  Pt a/o ready for transport upstairs.

## 2013-08-12 NOTE — ED Notes (Signed)
Pt presents with left sternal CP X 1600 today. Pt reports SHOB denies N/V diaphoresis. Condition made slightly better with nitro X 2. Pt has cardiac Hx

## 2013-08-12 NOTE — H&P (Signed)
PCP:   Michele Mcalpine, MD   Chief Complaint:  Sob and cp  HPI: 78 yo female h/o dm, htn, diastolic chf comes in with one week of worsening wt gain, and sob.  Today she started having sscp no radiation which was relieved after taking 2 ntg at home and asa.  She is normally on lasix 80 mg daily.  She increased her home dose of lasix several days ago due to increasing sob, and lower ext edema.  Since increasing her home lasix she has lost 7 lbs and her sob has markedly improved and her le edema has resolved.  However today when she started with cp she got concerned and came to ED.  She is cp free now.  No fevers.  No cough.  She has also had mildly increased worsening of her renal funcition.  uop is normal.  Review of Systems:  Positive and negative as per HPI otherwise all other systems are negative  Past Medical History: Past Medical History  Diagnosis Date  . Retinopathy   . Hypertension   . Chronic diastolic CHF (congestive heart failure)     has a normal EF per echo 03/2011 & 5/2013with diastolic dysfunction  . CAD (coronary artery disease)     Prior PCI in 1998 x 2; s/p cath in 2001, negative Myoview in 2008 & 2011, s/p cath in June 2012 with calcified disease of the proximal LCX with normal flow reserve. She has been managed medically due to her other morbidities  . Cerebrovascular disease   . Peripheral vascular disease     prior angiography showing RSFA stenosis, left anterior tibial and left posterior tibial stenoses  . Hypercholesterolemia   . Diabetes mellitus     insulin dependent  . Hypothyroidism   . Hiatal hernia   . Degenerative joint disease   . Osteoporosis   . Vitamin D deficiency   . Anemia of chronic disease   . Renal insufficiency   . Shortness of breath   . Anginal pain    Past Surgical History  Procedure Laterality Date  . Abdominal aortogram  07/27/2003    by Dr. Chales Abrahams  . Bilateral lower extremity angiogram  07/27/2003    by Dr. Chales Abrahams  . Selective  angiography,right superficial femoral artery, right and left iliac arteries  07/27/2003    by Dr. Chales Abrahams  . Measurement of gradient right and left iliac arteries  07/27/2003    by Dr. Chales Abrahams  . Bilateral cataract sugery  2000 and 2002    by Dr. Cecilie Kicks  . Repair left hip fracture  04/2008    by Dr. Charlann Boxer    Medications: Prior to Admission medications   Medication Sig Start Date End Date Taking? Authorizing Provider  acetaminophen (TYLENOL) 500 MG tablet Take 500 mg by mouth every 6 (six) hours as needed for pain.   Yes Historical Provider, MD  aspirin EC 81 MG tablet Take 81 mg by mouth daily.   Yes Historical Provider, MD  carvedilol (COREG) 6.25 MG tablet Take 1 tablet (6.25 mg total) by mouth 2 (two) times daily with a meal. 10/12/12  Yes Gaylord Shih, MD  Cholecalciferol (VITAMIN D) 1000 UNITS capsule Take 1,000 Units by mouth daily.     Yes Historical Provider, MD  furosemide (LASIX) 40 MG tablet Take 40 mg by mouth 2 (two) times daily. An extra tablet if needed   Yes Historical Provider, MD  insulin glargine (LANTUS) 100 UNIT/ML injection Inject 0.2 mLs (20 Units total)  into the skin at bedtime. 04/26/13  Yes Michele Mcalpine, MD  insulin lispro (HUMALOG) 100 UNIT/ML injection Inject 0-10 Units into the skin 3 (three) times daily before meals. Home sliding scale   Yes Historical Provider, MD  levothyroxine (SYNTHROID) 75 MCG tablet Take 1 tablet (75 mcg total) by mouth daily. 01/26/13  Yes Michele Mcalpine, MD  nitroGLYCERIN (NITROSTAT) 0.4 MG SL tablet Place 0.4 mg under the tongue every 5 (five) minutes as needed for chest pain.   Yes Historical Provider, MD  pantoprazole (PROTONIX) 40 MG tablet Take 40 mg by mouth daily. 30 minutes before breakfast and dinner 02/29/12  Yes Michele Mcalpine, MD  pravastatin (PRAVACHOL) 80 MG tablet Take 80 mg by mouth daily.  07/22/13  Yes Historical Provider, MD  traMADol (ULTRAM) 50 MG tablet Take 1 tablet (50 mg total) by mouth every 6 (six) hours as needed for pain.  08/11/13  Yes Michele Mcalpine, MD    Allergies:   Allergies  Allergen Reactions  . Ibuprofen     REACTION: edema---thyroid problems  . Prednisone     Causes severe fluid retention    Social History:  reports that she quit smoking about 38 years ago. Her smoking use included Cigarettes. She smoked 0.00 packs per day. She has never used smokeless tobacco. She reports that she does not drink alcohol or use illicit drugs.  Family History: Family History  Problem Relation Age of Onset  . Heart disease Mother     Physical Exam: Filed Vitals:   08/12/13 1714 08/12/13 1717  BP:  192/44  Pulse:  83  Temp:  98.1 F (36.7 C)  TempSrc:  Oral  Resp:  16  SpO2: 98% 100%   General appearance: alert, cooperative and no distress Head: Normocephalic, without obvious abnormality, atraumatic Eyes: negative Nose: Nares normal. Septum midline. Mucosa normal. No drainage or sinus tenderness. Neck: no JVD and supple, symmetrical, trachea midline Lungs: clear to auscultation bilaterally Heart: regular rate and rhythm, S1, S2 normal, no murmur, click, rub or gallop Abdomen: soft, non-tender; bowel sounds normal; no masses,  no organomegaly Extremities: extremities normal, atraumatic, no cyanosis or edema Pulses: 2+ and symmetric Skin: Skin color, texture, turgor normal. No rashes or lesions Neurologic: Grossly normal    Labs on Admission:   Recent Labs  08/12/13 1803  NA 137  K 3.6  CL 102  GLUCOSE 374*  BUN 46*  CREATININE 2.00*    Recent Labs  08/12/13 1724 08/12/13 1803  WBC 7.4  --   HGB 9.2* 9.5*  HCT 26.5* 28.0*  MCV 88.0  --   PLT 231  --     Recent Labs  08/12/13 1725  TROPONINI <0.30    Radiological Exams on Admission: Dg Chest 2 View  08/12/2013   *RADIOLOGY REPORT*  Clinical Data: Short of breath  CHEST - 2 VIEW  Comparison: 02/12/2013  Findings: Normal cardiac silhouette.  There is fine interstitial pattern with a linear markings laterally.  Breast tissue  node on the left and right.  There is blunting of the costophrenic angles slightly improved compared to prior.  IMPRESSION: Small bilateral pleural effusions and interstitial edema.   Original Report Authenticated By: Genevive Bi, M.D.    Assessment/Plan  78 yo female with resolving acute chf exacerbation, mildly worsening renal function and chest pain Principal Problem:   Chest pain Active Problems:   ANEMIA OF CHRONIC DISEASE   HYPERTENSION   Chronic diastolic heart failure   PAD (  peripheral artery disease)   Type 2 diabetes mellitus   Renal insufficiency  Will give her her bblocker for tonight, bp is uncontrolled usually takes her meds at 6pm and has not had it yet.  Place on ntg paste.  Romi.  Serial enzymes.  Hold lasix for now due to worsening renal function.  Cardiology consult pending.  Further recommendations per cardiology team.  Per history, sounds like her chf exac has improved over the last week with increasing her home lasix dose, monitor Cr closely.  ekg with no acute changes.  Tele monitoring.  Full code.  Oshay Stranahan A 08/12/2013, 7:47 PM

## 2013-08-12 NOTE — Progress Notes (Signed)
Admitted pt to rm 4E07 from ED via stretcher, alert and oriented, no c/o chest pain at this time, oriented to room, call bell placed within reach, orders carried out. NSR on heart monitor. Will continue to monitor.

## 2013-08-12 NOTE — Consult Note (Signed)
Reason for Consult: Heart failure Referring Physician:   [name] is an 78 y.o. female.  HPI: Nicole [name] is a 78 year old female with a history of heart failure with preserved ejection fraction, coronary artery disease, diabetes, chronic kidney disease, hypertension and PVD who presents with several days of worsening dyspnea upon exertion. She has been having weight gain and lower extremity edema over the past several days. She denies dietary indiscretion. She states that now she cannot walk more than 50 feet or so because of shortness of breath. She has orthopnea and PND. She has noticed increased swelling her leg bilaterally, mostly around the knees. She also had an episode of chest pain this afternoon. This was described as a dull ache in her left chest. It was not associated with diaphoresis, nausea, vomting or syncope. The pain lasted about 1 hour. This occurred at a little bit before 3 pm. She has not been regularly checking her blood pressure, but it is usually around 150 systolic. Her blood sugars are usually elevated.  Here in the ED she was slightly hypoxic. Her creatinine is slightly higher than it has been in the past. Her chest x-ray shows some intersitial edema. Her ecg shows ST depression in the infero-lateral leads but he first troponin is negative. Her blood pressure is elevated, greater than 160 systolic. She is not in any distress. She tells me that she hasnt had any changes recently in her medications. She has been taking them as directed.   She has had multiple cardiac caths in the past. This was most recently done in June of 2012. At that time she was found to have non-flow limiting disease by FFR.    Past Medical History  Diagnosis Date  . Retinopathy   . Hypertension   . Chronic diastolic CHF (congestive heart failure)     has a normal EF per echo 03/2011 & 5/2013with diastolic dysfunction  . CAD (coronary artery disease)     Prior PCI in 1998 x 2; s/p cath in 2001,  negative Myoview in 2008 & 2011, s/p cath in June 2012 with calcified disease of the proximal LCX with normal flow reserve. She has been managed medically due to her other morbidities  . Cerebrovascular disease   . Peripheral vascular disease     prior angiography showing RSFA stenosis, left anterior tibial and left posterior tibial stenoses  . Hypercholesterolemia   . Diabetes mellitus     insulin dependent  . Hypothyroidism   . Hiatal hernia   . Degenerative joint disease   . Osteoporosis   . Vitamin D deficiency   . Anemia of chronic disease   . Renal insufficiency   . Shortness of breath   . Anginal pain     Past Surgical History  Procedure Laterality Date  . Abdominal aortogram  07/27/2003    by Dr. Chales Abrahams  . Bilateral lower extremity angiogram  07/27/2003    by Dr. Chales Abrahams  . Selective angiography,right superficial femoral artery, right and left iliac arteries  07/27/2003    by Dr. Chales Abrahams  . Measurement of gradient right and left iliac arteries  07/27/2003    by Dr. Chales Abrahams  . Bilateral cataract sugery  2000 and 2002    by Dr. Cecilie Kicks  . Repair left hip fracture  04/2008    by Dr. Charlann Boxer    Family History  Problem Relation Age of Onset  . Heart disease Mother     Social History:  reports that  she quit smoking about 38 years ago. Her smoking use included Cigarettes. She smoked 0.00 packs per day. She has never used smokeless tobacco. She reports that she does not drink alcohol or use illicit drugs.  Allergies:  Allergies  Allergen Reactions  . Ibuprofen     REACTION: edema---thyroid problems  . Prednisone     Causes severe fluid retention   No current facility-administered medications on file prior to encounter.   Current Outpatient Prescriptions on File Prior to Encounter  Medication Sig Dispense Refill  . acetaminophen (TYLENOL) 500 MG tablet Take 500 mg by mouth every 6 (six) hours as needed for pain.      Marland Kitchen aspirin EC 81 MG tablet Take 81 mg by mouth daily.      .  carvedilol (COREG) 6.25 MG tablet Take 1 tablet (6.25 mg total) by mouth 2 (two) times daily with a meal.  180 tablet  3  . Cholecalciferol (VITAMIN D) 1000 UNITS capsule Take 1,000 Units by mouth daily.        . furosemide (LASIX) 40 MG tablet Take 40 mg by mouth 2 (two) times daily. An extra tablet if needed      . insulin glargine (LANTUS) 100 UNIT/ML injection Inject 0.2 mLs (20 Units total) into the skin at bedtime.  2 vial  3  . insulin lispro (HUMALOG) 100 UNIT/ML injection Inject 0-10 Units into the skin 3 (three) times daily before meals. Home sliding scale      . levothyroxine (SYNTHROID) 75 MCG tablet Take 1 tablet (75 mcg total) by mouth daily.  90 tablet  3  . nitroGLYCERIN (NITROSTAT) 0.4 MG SL tablet Place 0.4 mg under the tongue every 5 (five) minutes as needed for chest pain.      . pantoprazole (PROTONIX) 40 MG tablet Take 40 mg by mouth daily. 30 minutes before breakfast and dinner      . traMADol (ULTRAM) 50 MG tablet Take 1 tablet (50 mg total) by mouth every 6 (six) hours as needed for pain.  100 tablet  0     Medications: I have reviewed the patient's current medications.  Results for orders placed during the hospital encounter of 08/12/13 (from the past 48 hour(s))  CBC     Status: Abnormal   Collection Time    08/12/13  5:24 PM      Result Value Range   WBC 7.4  4.0 - 10.5 K/uL   RBC 3.01 (*) 3.87 - 5.11 MIL/uL   Hemoglobin 9.2 (*) 12.0 - 15.0 g/dL   HCT 16.1 (*) 09.6 - 04.5 %   MCV 88.0  78.0 - 100.0 fL   MCH 30.6  26.0 - 34.0 pg   MCHC 34.7  30.0 - 36.0 g/dL   RDW 40.9  81.1 - 91.4 %   Platelets 231  150 - 400 K/uL  TROPONIN I     Status: None   Collection Time    08/12/13  5:25 PM      Result Value Range   Troponin I <0.30  <0.30 ng/mL   Comment:            Due to the release kinetics of cTnI,     a negative result within the first hours     of the onset of symptoms does not rule out     myocardial infarction with certainty.     If myocardial  infarction is still suspected,     repeat the test at appropriate intervals.  POCT I-STAT, CHEM 8     Status: Abnormal   Collection Time    08/12/13  6:03 PM      Result Value Range   Sodium 137  135 - 145 mEq/L   Potassium 3.6  3.5 - 5.1 mEq/L   Chloride 102  96 - 112 mEq/L   BUN 46 (*) 6 - 23 mg/dL   Creatinine, Ser 4.09 (*) 0.50 - 1.10 mg/dL   Glucose, Bld 811 (*) 70 - 99 mg/dL   Calcium, Ion 9.14 (*) 1.13 - 1.30 mmol/L   TCO2 21  0 - 100 mmol/L   Hemoglobin 9.5 (*) 12.0 - 15.0 g/dL   HCT 78.2 (*) 95.6 - 21.3 %  OCCULT BLOOD, POC DEVICE     Status: None   Collection Time    08/12/13  6:44 PM      Result Value Range   Fecal Occult Bld NEGATIVE  NEGATIVE    Dg Chest 2 View  08/12/2013   *RADIOLOGY REPORT*  Clinical Data: Short of breath  CHEST - 2 VIEW  Comparison: 02/12/2013  Findings: Normal cardiac silhouette.  There is fine interstitial pattern with a linear markings laterally.  Breast tissue node on the left and right.  There is blunting of the costophrenic angles slightly improved compared to prior.  IMPRESSION: Small bilateral pleural effusions and interstitial edema.   Original Report Authenticated By: Genevive Bi, M.D.    Review of Systems  Constitutional: Negative for weight loss.  HENT: Negative.   Eyes: Negative.   Respiratory: Positive for shortness of breath.   Cardiovascular: Positive for chest pain, orthopnea, leg swelling and PND.  Gastrointestinal: Negative.   Genitourinary: Negative.   Musculoskeletal: Negative.   Neurological: Negative.   Endo/Heme/Allergies: Negative.   Psychiatric/Behavioral: Negative.   All other systems reviewed and are negative.   Blood pressure 178/47, pulse 72, temperature 98.1 F (36.7 C), temperature source Oral, resp. rate 18, SpO2 97.00%. Physical Exam  Constitutional: She is oriented to person, place, and time. No distress.  Neck: JVD present.  Cardiovascular: Regular rhythm.  Exam reveals gallop.   Murmur  heard. Respiratory: She has rales.  Musculoskeletal: She exhibits edema.  Neurological: She is alert and oriented to person, place, and time.  Skin: She is not diaphoretic.    Assessment/Plan: Nicole [name] is a pleasant 78 year old female with multiple medical problems including CKD, CAD and HFpEF. She presents today with acute heart failure. She also had an episode of chest pain with some ST change in the infero-lateral region. She is currently chest pain free and her first troponin is within normal limits. She is quite hypertensive in the ED. Her most recent anatomic evaluation of the coronaries was 2012. She did have a heavily calcified circumflex system. In regards to her heart failure she will need aggressive IV diuretics. I think that overall this is primarily heart failure, and the chest pain could be from elevated LVEDP. I would hold off on systemic anticoagulation until we further trend the cardiac biomarkers.    Acute on chronic diastolic heart failure- Would start with 60mg  IV lasix and increase if no response. I would dose the lasix q8hrs. Would aim for net -1L/day.  -Repeat TTE  Chest pain- PRN sublingual NTG for chest pain, Daily ECG. Follow troponin overnight. Would give her a full dose aspirin now and daily 81mg  aspirin.   Hypertension- This may improve with diuretics. Would recommend nitroglycerin paste or IV nitroglycerin to help lower her LVEDP.  I would not increase her coreg while she is having the heart failure exacerbation. Once her volume status normalizes, ACEi can be added.    Robyne Peers, MD   Anne Ng C 08/12/2013, 8:26 PM

## 2013-08-12 NOTE — ED Provider Notes (Signed)
CSN: [id number]     Arrival date & time 08/12/13  1711 History   First MD Initiated Contact with Patient 08/12/13 1714     Chief Complaint  Patient presents with  . Chest Pain   (Consider location/radiation/quality/duration/timing/severity/associated sxs/prior Treatment) HPI Comments: Pt states that she started having sob about 1 week ago:pt states that she increased her lasix dose 2 days ago and she has lost 7 lbs in the last week:pt states that she started having cp today that lasted 1 hour and was resolved after her nitro:pt states that this feels like previous episodes of chf  Patient is a 78 y.o. female presenting with chest pain. The history is provided by the patient. No language interpreter was used.  Chest Pain Pain location:  L chest Pain quality: aching and pressure   Pain radiates to:  Does not radiate Pain radiates to the back: no   Pain severity:  Moderate Onset quality:  Sudden Duration:  1 hour Timing:  Constant Progression:  Resolved Chronicity:  Recurrent Associated symptoms: shortness of breath   Associated symptoms: no syncope and not vomiting   Risk factors: coronary artery disease and hypertension     Past Medical History  Diagnosis Date  . Retinopathy   . Hypertension   . Chronic diastolic CHF (congestive heart failure)     has a normal EF per echo 03/2011 & 5/2013with diastolic dysfunction  . CAD (coronary artery disease)     Prior PCI in 1998 x 2; s/p cath in 2001, negative Myoview in 2008 & 2011, s/p cath in June 2012 with calcified disease of the proximal LCX with normal flow reserve. She has been managed medically due to her other morbidities  . Cerebrovascular disease   . Peripheral vascular disease     prior angiography showing RSFA stenosis, left anterior tibial and left posterior tibial stenoses  . Hypercholesterolemia   . Diabetes mellitus     insulin dependent  . Hypothyroidism   . Hiatal hernia   . Degenerative joint disease   .  Osteoporosis   . Vitamin D deficiency   . Anemia of chronic disease   . Renal insufficiency   . Shortness of breath   . Anginal pain    Past Surgical History  Procedure Laterality Date  . Abdominal aortogram  07/27/2003    by Dr. Chales Abrahams  . Bilateral lower extremity angiogram  07/27/2003    by Dr. Chales Abrahams  . Selective angiography,right superficial femoral artery, right and left iliac arteries  07/27/2003    by Dr. Chales Abrahams  . Measurement of gradient right and left iliac arteries  07/27/2003    by Dr. Chales Abrahams  . Bilateral cataract sugery  2000 and 2002    by Dr. Cecilie Kicks  . Repair left hip fracture  04/2008    by Dr. Charlann Boxer   Family History  Problem Relation Age of Onset  . Heart disease Mother    History  Substance Use Topics  . Smoking status: Former Smoker    Types: Cigarettes    Quit date: 12/07/1974  . Smokeless tobacco: Never Used     Comment: pt started smoking in 1956  . Alcohol Use: No   OB History   Grav Para Term Preterm Abortions TAB SAB Ect Mult Living                 Review of Systems  Constitutional: Negative.   Respiratory: Positive for shortness of breath.   Cardiovascular: Positive for chest pain. Negative  for syncope.  Gastrointestinal: Negative for vomiting.    Allergies  Ibuprofen and Prednisone  Home Medications   Current Outpatient Rx  Name  Route  Sig  Dispense  Refill  . acetaminophen (TYLENOL) 500 MG tablet   Oral   Take 500 mg by mouth every 6 (six) hours as needed for pain.         Marland Kitchen aspirin EC 81 MG tablet   Oral   Take 81 mg by mouth daily.         . carvedilol (COREG) 6.25 MG tablet   Oral   Take 1 tablet (6.25 mg total) by mouth 2 (two) times daily with a meal.   180 tablet   3   . Cholecalciferol (VITAMIN D) 1000 UNITS capsule   Oral   Take 1,000 Units by mouth daily.           . furosemide (LASIX) 40 MG tablet   Oral   Take 40 mg by mouth 2 (two) times daily. An extra tablet if needed         . insulin glargine  (LANTUS) 100 UNIT/ML injection   Subcutaneous   Inject 0.2 mLs (20 Units total) into the skin at bedtime.   2 vial   3   . insulin lispro (HUMALOG) 100 UNIT/ML injection   Subcutaneous   Inject 0-10 Units into the skin 3 (three) times daily before meals. Home sliding scale         . levothyroxine (SYNTHROID) 75 MCG tablet   Oral   Take 1 tablet (75 mcg total) by mouth daily.   90 tablet   3   . nitroGLYCERIN (NITROSTAT) 0.4 MG SL tablet   Sublingual   Place 0.4 mg under the tongue every 5 (five) minutes as needed for chest pain.         . pantoprazole (PROTONIX) 40 MG tablet   Oral   Take 40 mg by mouth daily. 30 minutes before breakfast and dinner         . pravastatin (PRAVACHOL) 80 MG tablet   Oral   Take 80 mg by mouth daily.          . traMADol (ULTRAM) 50 MG tablet   Oral   Take 1 tablet (50 mg total) by mouth every 6 (six) hours as needed for pain.   100 tablet   0    BP 192/44  Pulse 83  Temp(Src) 98.1 F (36.7 C) (Oral)  Resp 16  SpO2 100% Physical Exam  Nursing note and vitals reviewed. Constitutional: She is oriented to person, place, and time. She appears well-developed and well-nourished.  HENT:  Head: Normocephalic and atraumatic.  Eyes: Conjunctivae are normal.  Neck: Neck supple.  Cardiovascular: Normal rate and regular rhythm.   Pulmonary/Chest: Effort normal and breath sounds normal.  Abdominal: Soft. Bowel sounds are normal.  Musculoskeletal: Normal range of motion.  Neurological: She is alert and oriented to person, place, and time.  Skin: Skin is warm and dry.  Psychiatric: She has a normal mood and affect.    ED Course  Procedures (including critical care time) Labs Review Labs Reviewed  CBC - Abnormal; Notable for the following:    RBC 3.01 (*)    Hemoglobin 9.2 (*)    HCT 26.5 (*)    All other components within normal limits  POCT I-STAT, CHEM 8 - Abnormal; Notable for the following:    BUN 46 (*)    Creatinine, Ser  2.00 (*)  Glucose, Bld 374 (*)    Calcium, Ion 1.11 (*)    Hemoglobin 9.5 (*)    HCT 28.0 (*)    All other components within normal limits  TROPONIN I  OCCULT BLOOD, POC DEVICE    Date: 08/12/2013  Rate: 84  Rhythm: normal sinus rhythm  QRS Axis: normal  Intervals: normal  ST/T Wave abnormalities: st depression  Conduction Disutrbances:none  Narrative Interpretation:   Old EKG Reviewed: changes noted   Imaging Review Dg Chest 2 View  08/12/2013   *RADIOLOGY REPORT*  Clinical Data: Short of breath  CHEST - 2 VIEW  Comparison: 02/12/2013  Findings: Normal cardiac silhouette.  There is fine interstitial pattern with a linear markings laterally.  Breast tissue node on the left and right.  There is blunting of the costophrenic angles slightly improved compared to prior.  IMPRESSION: Small bilateral pleural effusions and interstitial edema.   Original Report Authenticated By: Genevive Bi, M.D.    MDM   1. Chest pain   2. Anemia   3. Renal insufficiency    Pt has some ekg changes and with anemia and decreased renal function pt likely needs to be admitted:pt to be admitted to hospitalist:consulted [facility] cards to see the pt    Teressa Lower, NP 08/12/13 2003

## 2013-08-12 NOTE — ED Notes (Signed)
Nicole [name] [phone].  Nicole [name] [phone]. Family would like to be called if any problems or if needed.

## 2013-08-13 DIAGNOSIS — N189 Chronic kidney disease, unspecified: Secondary | ICD-10-CM

## 2013-08-13 DIAGNOSIS — I509 Heart failure, unspecified: Secondary | ICD-10-CM

## 2013-08-13 DIAGNOSIS — I5033 Acute on chronic diastolic (congestive) heart failure: Secondary | ICD-10-CM

## 2013-08-13 DIAGNOSIS — N179 Acute kidney failure, unspecified: Secondary | ICD-10-CM | POA: Diagnosis present

## 2013-08-13 DIAGNOSIS — D649 Anemia, unspecified: Secondary | ICD-10-CM

## 2013-08-13 DIAGNOSIS — I5031 Acute diastolic (congestive) heart failure: Secondary | ICD-10-CM

## 2013-08-13 LAB — BASIC METABOLIC PANEL
BUN: 45 mg/dL — ABNORMAL HIGH (ref 6–23)
Creatinine, Ser: 1.86 mg/dL — ABNORMAL HIGH (ref 0.50–1.10)
GFR calc Af Amer: 29 mL/min — ABNORMAL LOW (ref >90)
GFR calc non Af Amer: 25 mL/min — ABNORMAL LOW (ref >90)
Glucose, Bld: 80 mg/dL (ref 70–99)

## 2013-08-13 LAB — CBC
HCT: 25.2 % — ABNORMAL LOW (ref 36.0–46.0)
Hemoglobin: 8.6 g/dL — ABNORMAL LOW (ref 12.0–15.0)
MCH: 30.4 pg (ref 26.0–34.0)
MCHC: 34.1 g/dL (ref 30.0–36.0)
RDW: 13.2 % (ref 11.5–15.5)

## 2013-08-13 LAB — GLUCOSE, CAPILLARY
Glucose-Capillary: 77 mg/dL (ref 70–99)
Glucose-Capillary: 93 mg/dL (ref 70–99)

## 2013-08-13 MED ORDER — INSULIN ASPART 100 UNIT/ML ~~LOC~~ SOLN
3.0000 [IU] | Freq: Three times a day (TID) | SUBCUTANEOUS | Status: DC
  Administered 2013-08-13 – 2013-08-16 (×6): 3 [IU] via SUBCUTANEOUS

## 2013-08-13 MED ORDER — TRAMADOL HCL 50 MG PO TABS
100.0000 mg | ORAL_TABLET | Freq: Two times a day (BID) | ORAL | Status: DC
  Administered 2013-08-13: 100 mg via ORAL
  Administered 2013-08-14 – 2013-08-15 (×2): 50 mg via ORAL
  Administered 2013-08-17 – 2013-08-18 (×2): 100 mg via ORAL
  Administered 2013-08-19 – 2013-08-21 (×4): 50 mg via ORAL
  Filled 2013-08-13 (×15): qty 2

## 2013-08-13 MED ORDER — INSULIN ASPART 100 UNIT/ML ~~LOC~~ SOLN
0.0000 [IU] | Freq: Every day | SUBCUTANEOUS | Status: DC
  Administered 2013-08-14: 22:00:00 2 [IU] via SUBCUTANEOUS

## 2013-08-13 MED ORDER — POTASSIUM CHLORIDE CRYS ER 20 MEQ PO TBCR
40.0000 meq | EXTENDED_RELEASE_TABLET | Freq: Two times a day (BID) | ORAL | Status: AC
  Administered 2013-08-13 (×2): 40 meq via ORAL
  Filled 2013-08-13 (×2): qty 2

## 2013-08-13 MED ORDER — INSULIN ASPART 100 UNIT/ML ~~LOC~~ SOLN
0.0000 [IU] | Freq: Three times a day (TID) | SUBCUTANEOUS | Status: DC
  Administered 2013-08-13: 4 [IU] via SUBCUTANEOUS
  Administered 2013-08-14 – 2013-08-15 (×2): 1 [IU] via SUBCUTANEOUS
  Administered 2013-08-15: 12:00:00 5 [IU] via SUBCUTANEOUS
  Administered 2013-08-16: 3 [IU] via SUBCUTANEOUS
  Administered 2013-08-16: 18:00:00 7 [IU] via SUBCUTANEOUS
  Administered 2013-08-17: 3 [IU] via SUBCUTANEOUS
  Administered 2013-08-17: 1 [IU] via SUBCUTANEOUS
  Administered 2013-08-18 – 2013-08-19 (×3): 2 [IU] via SUBCUTANEOUS
  Administered 2013-08-19: 3 [IU] via SUBCUTANEOUS
  Administered 2013-08-20 (×2): 2 [IU] via SUBCUTANEOUS
  Administered 2013-08-22: 1 [IU] via SUBCUTANEOUS
  Administered 2013-08-22: 2 [IU] via SUBCUTANEOUS

## 2013-08-13 MED ORDER — INSULIN GLARGINE 100 UNIT/ML ~~LOC~~ SOLN
10.0000 [IU] | Freq: Every day | SUBCUTANEOUS | Status: DC
  Administered 2013-08-13: 22:00:00 10 [IU] via SUBCUTANEOUS
  Filled 2013-08-13: qty 0.1

## 2013-08-13 MED ORDER — CARVEDILOL 12.5 MG PO TABS
12.5000 mg | ORAL_TABLET | Freq: Two times a day (BID) | ORAL | Status: DC
  Administered 2013-08-13 – 2013-08-21 (×17): 12.5 mg via ORAL
  Filled 2013-08-13 (×21): qty 1

## 2013-08-13 MED ORDER — FUROSEMIDE 10 MG/ML IJ SOLN
INTRAMUSCULAR | Status: AC
  Filled 2013-08-13: qty 8

## 2013-08-13 MED ORDER — FUROSEMIDE 10 MG/ML IJ SOLN
60.0000 mg | Freq: Three times a day (TID) | INTRAMUSCULAR | Status: DC
  Administered 2013-08-13 – 2013-08-14 (×4): 60 mg via INTRAVENOUS
  Filled 2013-08-13 (×4): qty 6

## 2013-08-13 MED ORDER — SIMVASTATIN 40 MG PO TABS
40.0000 mg | ORAL_TABLET | Freq: Every day | ORAL | Status: DC
  Administered 2013-08-13 – 2013-08-14 (×2): 40 mg via ORAL
  Filled 2013-08-13 (×3): qty 1

## 2013-08-13 MED ORDER — ISOSORB DINITRATE-HYDRALAZINE 20-37.5 MG PO TABS
1.0000 | ORAL_TABLET | Freq: Two times a day (BID) | ORAL | Status: DC
  Administered 2013-08-13 (×2): 1 via ORAL
  Filled 2013-08-13 (×4): qty 1

## 2013-08-13 MED ORDER — FUROSEMIDE 10 MG/ML IJ SOLN
INTRAMUSCULAR | Status: AC
  Administered 2013-08-13: 40 mg
  Filled 2013-08-13: qty 4

## 2013-08-13 MED ORDER — LISINOPRIL 5 MG PO TABS: 5.0000 mg | ORAL_TABLET | Freq: Every day | ORAL | Status: DC

## 2013-08-13 NOTE — Progress Notes (Addendum)
[facility] PROGRESS NOTE  Assessment/Plan: Acute diastolic heart failure/Chronic diastolic heart failure: - 61.4 kg on admission. - Started on lasix, monitor electrolytes, replete as needed. - Coreg, add low bidil. - ? Dry weight is 57. Kg. - EKG SR nonspecific T wave changes, troponin negative.  ANEMIA OF CHRONIC DISEASE/GFR < 50 : - Mild drop in Hbg. - no overt sign of bleeding., Guiac stools.  Acute on Chronic kidney disease III: - ? basleine Cr 1.3-1.5. - Hold ACE. - Cr cont to improve with diuresis.  Type 2 diabetes mellitus: - Improved with Lantus. - Cont SSI.  PAD (peripheral artery disease): - ASA, axs.   HYPERTENSION - BP mildly elevated. - start bidil BID.      Code Status: full Family Communication: none  Disposition Plan: inpatinet   Consultants:  cardiology  Procedures:  echo  Antibiotics:  None  HPI/Subjective: Still complaining of SOB and mild chest discomfort.  Objective: Filed Vitals:   08/12/13 2110 08/13/13 0154 08/13/13 0203 08/13/13 0600  BP: 188/35 165/43  160/44  Pulse: 71 65  65  Temp: 98.9 F (37.2 C) 98.6 F (37 C)  98.4 F (36.9 C)  TempSrc: Oral Oral  Oral  Resp: 19 18  18   Height:      Weight:   61.6 kg (135 lb 12.9 oz) 61.4 kg (135 lb 5.8 oz)  SpO2: 97% 93%  99%    Intake/Output Summary (Last 24 hours) at 08/13/13 0954 Last data filed at 08/13/13 0835  Gross per 24 hour  Intake    360 ml  Output    650 ml  Net   -290 ml   Filed Weights   08/12/13 2100 08/13/13 0203 08/13/13 0600  Weight: 61.7 kg (136 lb 0.4 oz) 61.6 kg (135 lb 12.9 oz) 61.4 kg (135 lb 5.8 oz)    Exam:  General: Alert, awake, oriented x3, in no acute distress.  HEENT: No bruits, no goiter.  Heart: Regular rate and rhythm, without murmurs, rubs, gallops.  Lungs: Good air movement, clear to auscultation Abdomen: Soft, nontender, nondistended, positive bowel sounds.     Data Reviewed: Basic Metabolic Panel:  Recent  Labs Lab 08/12/13 1803 08/13/13 0535  NA 137 141  K 3.6 3.0*  CL 102 106  CO2  --  23  GLUCOSE 374* 80  BUN 46* 45*  CREATININE 2.00* 1.86*  CALCIUM  --  8.5   Liver Function Tests: No results found for this basename: AST, ALT, ALKPHOS, BILITOT, PROT, ALBUMIN,  in the last 168 hours No results found for this basename: LIPASE, AMYLASE,  in the last 168 hours No results found for this basename: AMMONIA,  in the last 168 hours CBC:  Recent Labs Lab 08/12/13 1724 08/12/13 1803 08/13/13 0535  WBC 7.4  --  5.2  HGB 9.2* 9.5* 8.6*  HCT 26.5* 28.0* 25.2*  MCV 88.0  --  89.0  PLT 231  --  193   Cardiac Enzymes:  Recent Labs Lab 08/12/13 1725 08/12/13 2250  TROPONINI <0.30 <0.30   BNP (last 3 results)  Recent Labs  02/09/13 1230 02/13/13 1145 02/14/13 0540  PROBNP 2537.0* 2031.0* 1703.0*   CBG:  Recent Labs Lab 08/12/13 2105 08/13/13 0557  GLUCAP 280* 77    No results found for this or any previous visit (from the past 240 hour(s)).   Studies: Dg Chest 2 View  08/12/2013   *RADIOLOGY REPORT*  Clinical Data: Short of breath  CHEST - 2 VIEW  Comparison: 02/12/2013  Findings: Normal cardiac silhouette.  There is fine interstitial pattern with a linear markings laterally.  Breast tissue node on the left and right.  There is blunting of the costophrenic angles slightly improved compared to prior.  IMPRESSION: Small bilateral pleural effusions and interstitial edema.   Original Report Authenticated By: Genevive Bi, M.D.    Scheduled Meds: . aspirin EC  81 mg Oral Daily  . carvedilol  12.5 mg Oral BID WC  . furosemide  60 mg Intravenous Q8H  . insulin aspart  0-9 Units Subcutaneous TID WC  . insulin glargine  20 Units Subcutaneous QHS  . levothyroxine  75 mcg Oral QAC breakfast  . nitroGLYCERIN  0.5 inch Topical Q8H  . pantoprazole  40 mg Oral Daily  . potassium chloride  40 mEq Oral BID  . sodium chloride  3 mL Intravenous Q12H  . sodium chloride  3 mL  Intravenous Q12H   Continuous Infusions:    Marinda Elk  [facility] Pager [phone]. If 8PM-8AM, please contact night-coverage at [email or web address], password [facility] 08/13/2013, 9:54 AM  LOS: 1 day

## 2013-08-13 NOTE — Progress Notes (Signed)
Pt requesting tramadol and pravastatin this PM. MD notified and new orders given. Baron Hamper, RN

## 2013-08-13 NOTE — Progress Notes (Signed)
1802 resting comfortably . No apparent distress

## 2013-08-13 NOTE — Progress Notes (Signed)
  Echocardiogram 2D Echocardiogram has been performed.  Nicole [name] 08/13/2013, 12:22 PM

## 2013-08-13 NOTE — Progress Notes (Signed)
SUBJECTIVE: The patient is doing well today. SOB is improving.  She has stable edema.  At this time, she denies chest pain or any new concerns.  Marland Kitchen aspirin EC  81 mg Oral Daily  . carvedilol  6.25 mg Oral BID WC  . insulin aspart  0-9 Units Subcutaneous TID WC  . insulin glargine  20 Units Subcutaneous QHS  . levothyroxine  75 mcg Oral QAC breakfast  . nitroGLYCERIN  0.5 inch Topical Q8H  . pantoprazole  40 mg Oral Daily  . potassium chloride  40 mEq Oral BID  . sodium chloride  3 mL Intravenous Q12H  . sodium chloride  3 mL Intravenous Q12H      OBJECTIVE: Physical Exam: Filed Vitals:   08/12/13 2110 08/13/13 0154 08/13/13 0203 08/13/13 0600  BP: 188/35 165/43  160/44  Pulse: 71 65  65  Temp: 98.9 F (37.2 C) 98.6 F (37 C)  98.4 F (36.9 C)  TempSrc: Oral Oral  Oral  Resp: 19 18  18   Height:      Weight:   135 lb 12.9 oz (61.6 kg) 135 lb 5.8 oz (61.4 kg)  SpO2: 97% 93%  99%    Intake/Output Summary (Last 24 hours) at 08/13/13 0855 Last data filed at 08/13/13 0835  Gross per 24 hour  Intake    360 ml  Output    650 ml  Net   -290 ml    Telemetry reveals sinus rhythm  GEN- The patient is elderly appearing, alert and oriented x 3 today.   Head- normocephalic, atraumatic Eyes-  Sclera clear, conjunctiva pink Ears- hearing intact Oropharynx- clear Neck- supple,   Lungs- bibasilar rales, otherwise clear to ausculation bilaterally, normal work of breathing Heart- Regular rate and rhythm,2/6 SEM LUSB GI- soft, NT, ND, + BS Extremities- no clubbing, cyanosis, +2 edema Skin- no rash or lesion Psych- euthymic mood, full affect Neuro- strength and sensation are intact  LABS: Basic Metabolic Panel:  Recent Labs  19/14/78 1803 08/13/13 0535  NA 137 141  K 3.6 3.0*  CL 102 106  CO2  --  23  GLUCOSE 374* 80  BUN 46* 45*  CREATININE 2.00* 1.86*  CALCIUM  --  8.5   Liver Function Tests: No results found for this basename: AST, ALT, ALKPHOS, BILITOT, PROT,  ALBUMIN,  in the last 72 hours No results found for this basename: LIPASE, AMYLASE,  in the last 72 hours CBC:  Recent Labs  08/12/13 1724 08/12/13 1803 08/13/13 0535  WBC 7.4  --  5.2  HGB 9.2* 9.5* 8.6*  HCT 26.5* 28.0* 25.2*  MCV 88.0  --  89.0  PLT 231  --  193   Cardiac Enzymes:  Recent Labs  08/12/13 1725 08/12/13 2250  TROPONINI <0.30 <0.30   RADIOLOGY: Dg Chest 2 View  08/12/2013   *RADIOLOGY REPORT*  Clinical Data: Short of breath  CHEST - 2 VIEW  Comparison: 02/12/2013  Findings: Normal cardiac silhouette.  There is fine interstitial pattern with a linear markings laterally.  Breast tissue node on the left and right.  There is blunting of the costophrenic angles slightly improved compared to prior.  IMPRESSION: Small bilateral pleural effusions and interstitial edema.   Original Report Authenticated By: Genevive Bi, M.D.    ASSESSMENT AND PLAN:  Principal Problem:   Chest pain Active Problems:   ANEMIA OF CHRONIC DISEASE   HYPERTENSION   Chronic diastolic heart failure   PAD (peripheral artery disease)   Type  2 diabetes mellitus   Renal insufficiency  Ms Graybeal is a pleasant 78 year old female with multiple medical problems including CKD, CAD and HFpEF. She presents today with acute heart failure. She also had an episode of chest pain with some ST change in the infero-lateral region in the setting of markedly elevated BP. She is currently chest pain free and her troponin is within normal limits.  1.  Acute on chronic diastolic heart failure- Continue 60mg  IV lasix q8 hours as renal function allows. Would aim for net -1L/day.  -Repeat TTE - 2 gram sodium diet - aggressive BP control will be important  2. CAD Her most recent anatomic evaluation of the coronaries was 2012. She did have a heavily calcified circumflex system. I Given her renal failure and advanced age, I would recommend that medical therapy be our initial strategy.  Once her BP is adequately  controlled, outpatient functional testing may be reasonable.  Would reserve cath until medical options have been exhausted.  3. Hypertension- This may improve with diuretics. Would recommend nitroglycerin paste or IV nitroglycerin to help lower her LVEDP. Increase coreg today.  Add ace inhibitor once renal function is improved.  4. Acute on chronic renal failure Will need close outpatient follow-up Follow renal function closely while diuresis Aggressive BP control is necessary  5. Anemia She reports that this has been an issue for several years.  She has not had a colonoscopy.  Additional outpatient workup is needed.  6. DM Follow glucose   Hillis Range, MD 08/13/2013 8:55 AM

## 2013-08-14 DIAGNOSIS — E108 Type 1 diabetes mellitus with unspecified complications: Secondary | ICD-10-CM

## 2013-08-14 DIAGNOSIS — N183 Chronic kidney disease, stage 3 unspecified: Secondary | ICD-10-CM

## 2013-08-14 LAB — BASIC METABOLIC PANEL WITH GFR
BUN: 42 mg/dL — ABNORMAL HIGH (ref 6–23)
CO2: 23 meq/L (ref 19–32)
Calcium: 8.4 mg/dL (ref 8.4–10.5)
Chloride: 105 meq/L (ref 96–112)
Creatinine, Ser: 2.03 mg/dL — ABNORMAL HIGH (ref 0.50–1.10)
GFR calc Af Amer: 26 mL/min — ABNORMAL LOW (ref >90)
GFR calc non Af Amer: 23 mL/min — ABNORMAL LOW (ref >90)
Glucose, Bld: 112 mg/dL — ABNORMAL HIGH (ref 70–99)
Potassium: 4.3 meq/L (ref 3.5–5.1)
Sodium: 137 meq/L (ref 135–145)

## 2013-08-14 LAB — CBC
HCT: 25.2 % — ABNORMAL LOW (ref 36.0–46.0)
Hemoglobin: 8.4 g/dL — ABNORMAL LOW (ref 12.0–15.0)
MCH: 29.7 pg (ref 26.0–34.0)
MCHC: 33.3 g/dL (ref 30.0–36.0)
MCV: 89 fL (ref 78.0–100.0)
Platelets: 209 K/uL (ref 150–400)
RBC: 2.83 MIL/uL — ABNORMAL LOW (ref 3.87–5.11)
RDW: 13.3 % (ref 11.5–15.5)
WBC: 5.2 K/uL (ref 4.0–10.5)

## 2013-08-14 LAB — GLUCOSE, CAPILLARY
Glucose-Capillary: 98 mg/dL (ref 70–99)
Glucose-Capillary: 190 mg/dL — ABNORMAL HIGH (ref 70–99)
Glucose-Capillary: 230 mg/dL — ABNORMAL HIGH (ref 70–99)

## 2013-08-14 LAB — HEMOGLOBIN A1C
Hgb A1c MFr Bld: 11.1 % — ABNORMAL HIGH (ref <5.7)
Mean Plasma Glucose: 272 mg/dL — ABNORMAL HIGH (ref <117)

## 2013-08-14 MED ORDER — FUROSEMIDE 10 MG/ML IJ SOLN: 40.0000 mg | Freq: Three times a day (TID) | INTRAMUSCULAR | Status: DC

## 2013-08-14 MED ORDER — INSULIN GLARGINE 100 UNIT/ML ~~LOC~~ SOLN
20.0000 [IU] | Freq: Every day | SUBCUTANEOUS | Status: DC
  Administered 2013-08-14 – 2013-08-15 (×2): 20 [IU] via SUBCUTANEOUS
  Filled 2013-08-14 (×3): qty 0.2

## 2013-08-14 MED ORDER — ISOSORB DINITRATE-HYDRALAZINE 20-37.5 MG PO TABS
1.0000 | ORAL_TABLET | Freq: Three times a day (TID) | ORAL | Status: DC
  Administered 2013-08-14 – 2013-08-15 (×4): 1 via ORAL
  Filled 2013-08-14 (×6): qty 1

## 2013-08-14 MED ORDER — LISINOPRIL 5 MG PO TABS
5.0000 mg | ORAL_TABLET | Freq: Every day | ORAL | Status: DC
  Administered 2013-08-14: 5 mg via ORAL
  Filled 2013-08-14 (×2): qty 1

## 2013-08-14 MED ORDER — FUROSEMIDE 10 MG/ML IJ SOLN
20.0000 mg | Freq: Every day | INTRAMUSCULAR | Status: DC
  Administered 2013-08-14: 11:00:00 20 mg via INTRAVENOUS
  Filled 2013-08-14: qty 2

## 2013-08-14 NOTE — Evaluation (Signed)
Physical Therapy Evaluation Patient Details Name: Nicole [name] MRN: [id number] DOB: May 10, 1935 Today's Date: 08/14/2013 Time: 4098-1191 PT Time Calculation (min): 22 min  PT Assessment / Plan / Recommendation History of Present Illness  78 yo female h/o dm, htn, diastolic chf comes in with one week of worsening wt gain, and sob.  Today she started having sscp no radiation which was relieved after taking 2 ntg at home and asa.  She is normally on lasix 80 mg daily.  She increased her home dose of lasix several days ago due to increasing sob, and lower ext edema.  Since increasing her home lasix she has lost 7 lbs and her sob has markedly improved and her le edema has resolved.  However today when she started with cp she got concerned and came to ED.  She is cp free now.  No fevers.  No cough.  She has also had mildly increased worsening of her renal funcition.  uop is normal  Clinical Impression  Pt functioning near baseline however currently requires use of RW now due to L LE pain and bilat LE edema. Pt strongly desires to return home alone and suspect pt will achieve safe mod I function during hospital stay for safe return home alone. Pt to strongly benefit from use of RW, which pt owns, and use of tub bench due to pt with increased difficulty stepping over side of tub. Acute PT to con't to follow to progress mobility to safe mod I function in prep for d/c home alone.    PT Assessment  Patient needs continued PT services    Follow Up Recommendations  No PT follow up;Supervision - Intermittent    Does the patient have the potential to tolerate intense rehabilitation      Barriers to Discharge Decreased caregiver support      Equipment Recommendations   (tub bench)    Recommendations for Other Services     Frequency Min 3X/week    Precautions / Restrictions Precautions Precautions: Fall Restrictions Weight Bearing Restrictions: No   Pertinent Vitals/Pain bilat LE edema       Mobility  Bed Mobility Bed Mobility: Supine to Sit Supine to Sit: 6: Modified independent (Device/Increase time);HOB elevated;With rails Details for Bed Mobility Assistance: increased time Transfers Transfers: Sit to Stand;Stand to Sit Sit to Stand: 4: Min guard;With upper extremity assist;From bed Stand to Sit: 4: Min guard;With upper extremity assist;To chair/3-in-1 Details for Transfer Assistance: increased time, L antalgic limp requiring pt to reach for something to hold onto Ambulation/Gait Ambulation/Gait Assistance: 5: Supervision Ambulation Distance (Feet): 150 Feet Assistive device: Rolling walker Ambulation/Gait Assistance Details: attempted to amb without RW however pt unsteady and kept reaching for something to hold onto. Pt reports she's noticed at home she is more unsteady at night with the cane and she should probably start using her RW Gait Pattern: Step-through pattern;Decreased stride length Gait velocity: wfl General Gait Details: no episodes of LOB with RW Stairs: No    Exercises     PT Diagnosis: Generalized weakness;Difficulty walking  PT Problem List: Decreased strength;Decreased activity tolerance;Decreased balance;Decreased mobility PT Treatment Interventions: DME instruction;Gait training;Stair training;Functional mobility training;Therapeutic activities;Therapeutic exercise     PT Goals(Current goals can be found in the care plan section) Acute Rehab PT Goals Patient Stated Goal: to return home PT Goal Formulation: With patient Time For Goal Achievement: 08/21/13 Potential to Achieve Goals: Good  Visit Information  Last PT Received On: 08/14/13 Assistance Needed: +1 History of Present Illness:  78 yo female h/o dm, htn, diastolic chf comes in with one week of worsening wt gain, and sob.  Today she started having sscp no radiation which was relieved after taking 2 ntg at home and asa.  She is normally on lasix 80 mg daily.  She increased her home  dose of lasix several days ago due to increasing sob, and lower ext edema.  Since increasing her home lasix she has lost 7 lbs and her sob has markedly improved and her le edema has resolved.  However today when she started with cp she got concerned and came to ED.  She is cp free now.  No fevers.  No cough.  She has also had mildly increased worsening of her renal funcition.  uop is normal       Prior Functioning  Home Living Family/patient expects to be discharged to:: Private residence Living Arrangements: Alone Available Help at Discharge: Family;Available PRN/intermittently Type of Home: House Home Access: Stairs to enter [employer] of Steps: 1 Entrance Stairs-Rails: Right Home Layout: One level Home Equipment: Walker - 2 wheels;Bedside commode Additional Comments: pt to benefit from tub bench due to difficulty with stepping over side of tub Prior Function Level of Independence: Independent with assistive device(s) Comments: pt uses cane, still drives and cares for dog Communication Communication: No difficulties Dominant Hand: Right    Cognition  Cognition Arousal/Alertness: Awake/alert Behavior During Therapy: WFL for tasks assessed/performed Overall Cognitive Status: Within Functional Limits for tasks assessed    Extremity/Trunk Assessment Upper Extremity Assessment Upper Extremity Assessment: Overall WFL for tasks assessed Lower Extremity Assessment Lower Extremity Assessment: LLE deficits/detail LLE Deficits / Details: grossly 4/5 due to h/o of L hip fracture 83yrs ago Cervical / Trunk Assessment Cervical / Trunk Assessment: Normal   Balance    End of Session PT - End of Session Equipment Utilized During Treatment: Gait belt Activity Tolerance: Patient tolerated treatment well Patient left: in chair;with call bell/phone within reach Nurse Communication: Mobility status  GP     Marcene Brawn 08/14/2013, 10:30 AM  Lewis Shock, PT, DPT Pager  #: [phone] Office #: [phone]

## 2013-08-14 NOTE — Progress Notes (Signed)
Patient Name: Nicole [name] Date of Encounter: 08/14/2013    Principal Problem:   Acute diastolic heart failure Active Problems:   ANEMIA OF CHRONIC DISEASE   HYPERTENSION   Chronic diastolic heart failure   PAD (peripheral artery disease)   Type 2 diabetes mellitus   Chronic kidney disease, stage III (moderate)   AKI (acute kidney injury)    SUBJECTIVE  The patient feels better, she denies chest pain but still reports mild SOB and tenderness in her lower extremities.   CURRENT MEDS . aspirin EC  81 mg Oral Daily  . carvedilol  12.5 mg Oral BID WC  . furosemide  60 mg Intravenous Q8H  . insulin aspart  0-5 Units Subcutaneous QHS  . insulin aspart  0-9 Units Subcutaneous TID WC  . insulin aspart  3 Units Subcutaneous TID WC  . insulin glargine  20 Units Subcutaneous QHS  . isosorbide-hydrALAZINE  1 tablet Oral TID  . levothyroxine  75 mcg Oral QAC breakfast  . nitroGLYCERIN  0.5 inch Topical Q8H  . pantoprazole  40 mg Oral Daily  . simvastatin  40 mg Oral q1800  . sodium chloride  3 mL Intravenous Q12H  . sodium chloride  3 mL Intravenous Q12H  . traMADol  100 mg Oral BID    OBJECTIVE  Filed Vitals:   08/13/13 1323 08/13/13 2136 08/13/13 2311 08/14/13 0612  BP: 160/37 182/52 160/54 155/39  Pulse: 62 67 62 56  Temp: 98.4 F (36.9 C) 98.7 F (37.1 C)  98.6 F (37 C)  TempSrc: Oral Oral  Oral  Resp: 18 18  16   Height:      Weight:    137 lb 12.6 oz (62.5 kg)  SpO2: 100% 98%  95%    Intake/Output Summary (Last 24 hours) at 08/14/13 0814 Last data filed at 08/14/13 0618  Gross per 24 hour  Intake    580 ml  Output   2725 ml  Net  -2145 ml   Filed Weights   08/13/13 0203 08/13/13 0600 08/14/13 0612  Weight: 135 lb 12.9 oz (61.6 kg) 135 lb 5.8 oz (61.4 kg) 137 lb 12.6 oz (62.5 kg)    PHYSICAL EXAM  General: Pleasant, NAD. Neuro: Alert and oriented X 3. Moves all extremities spontaneously. Psych: Normal affect. HEENT:  Normal  Neck: Supple  without bruits or JVD. Lungs:  Resp regular, mild crackles bibasilary up to 1/3 of the lungs. Heart: RRR no s3, s4, 2/6 holosystolic murmur . Abdomen: Soft, non-tender, non-distended, BS + x 4.  Extremities: No clubbing, cyanosis, there is pitting edema B/L with tenderness. DP/PT/Radials 2+ and equal bilaterally.  Accessory Clinical Findings  CBC  Recent Labs  08/13/13 0535 08/14/13 0648  WBC 5.2 5.2  HGB 8.6* 8.4*  HCT 25.2* 25.2*  MCV 89.0 89.0  PLT 193 209   Basic Metabolic Panel  Recent Labs  08/12/13 1803 08/13/13 0535  NA 137 141  K 3.6 3.0*  CL 102 106  CO2  --  23  GLUCOSE 374* 80  BUN 46* 45*  CREATININE 2.00* 1.86*  CALCIUM  --  8.5   Liver Function Tests No results found for this basename: AST, ALT, ALKPHOS, BILITOT, PROT, ALBUMIN,  in the last 72 hours No results found for this basename: LIPASE, AMYLASE,  in the last 72 hours Cardiac Enzymes  Recent Labs  08/12/13 1725 08/12/13 2250  TROPONINI <0.30 <0.30   BNP No components found with this basename: POCBNP,  D-Dimer No  results found for this basename: DDIMER,  in the last 72 hours Hemoglobin A1C  Recent Labs  08/13/13 0535  HGBA1C 11.1*   Fasting Lipid Panel No results found for this basename: CHOL, HDL, LDLCALC, TRIG, CHOLHDL, LDLDIRECT,  in the last 72 hours  TELE  SR, HR 58/minute   Radiology/Studies  Dg Chest 2 View  08/12/2013   *RADIOLOGY REPORT*  Clinical Data: Short of breath  CHEST - 2 VIEW  Comparison: 02/12/2013  Findings: Normal cardiac silhouette.  There is fine interstitial pattern with a linear markings laterally.  Breast tissue node on the left and right.  There is blunting of the costophrenic angles slightly improved compared to prior.  IMPRESSION: Small bilateral pleural effusions and interstitial edema.   Original Report Authenticated By: Genevive Bi, M.D.     ASSESSMENT AND PLAN:   Nicole [name] is a 78 year old female with multiple medical problems including  CKD, CAD and HFpEF admitted for acute on chronic heart failure and chest pain with ECG changes in the settings of hypertensive urgency.   1. Acute on chronic diastolic heart failure- Continue 40mg  IV lasix q8 hours as renal function allows.   - 2 gram sodium diet  -  aggressive BP control will be important  - start Lisinopril 5 mg daily for afterload reduction, follow Crea/GFR  2. CAD - chest pain, negative Troponin, now pain free Her most recent anatomic evaluation of the coronaries was 2012. She did have a heavily calcified circumflex system. I  Given her renal failure and advanced age, I would recommend that medical therapy be our initial strategy. Once her BP is adequately controlled, outpatient functional testing may be reasonable. Would reserve cath until medical options have been exhausted.   3. Hypertension-  - continue Coreg and Bidil - add Lisinopril -  4. CKD Follow renal function closely while diuresis  Aggressive BP control is necessary   5. Anemia  She reports that this has been an issue for several years. She has not had a colonoscopy. Additional outpatient workup is needed.    Signed, Lars Masson MD

## 2013-08-14 NOTE — Progress Notes (Signed)
[facility] PROGRESS NOTE  Assessment/Plan: Acute diastolic heart failure/Chronic diastolic heart failure: - 61.4 kg on admission. ? Weight as it has gone up. - Conton lasix, monitor electrolytes, replete as needed. - Cont. Coreg and bidil. - ? Dry weight is 57. Kg. - Negative about 2.0 L, b-met pending.  ANEMIA OF CHRONIC DISEASE/GFR < 50 : - Mild drop in Hbg. - no overt sign of bleeding., Guiac stools neagtive, follow up as an outpatinet.  Acute on Chronic kidney disease III: - ? basleine Cr 1.3-1.5.  - Hold ACE. - Cr cont to improve with diuresis.  Type 2 diabetes mellitus: - Improved with Lantus. - Cont SSI.  PAD (peripheral artery disease): - ASA, axs.   HYPERTENSION - BP mildly elevated. - increase bidil TID.      Code Status: full Family Communication: none  Disposition Plan: inpatinet   Consultants:  cardiology  Procedures:  echo  Antibiotics:  None  HPI/Subjective: SOB with laying flat.  Objective: Filed Vitals:   08/13/13 1323 08/13/13 2136 08/13/13 2311 08/14/13 0612  BP: 160/37 182/52 160/54 155/39  Pulse: 62 67 62 56  Temp: 98.4 F (36.9 C) 98.7 F (37.1 C)  98.6 F (37 C)  TempSrc: Oral Oral  Oral  Resp: 18 18  16   Height:      Weight:    62.5 kg (137 lb 12.6 oz)  SpO2: 100% 98%  95%    Intake/Output Summary (Last 24 hours) at 08/14/13 0745 Last data filed at 08/14/13 0618  Gross per 24 hour  Intake    580 ml  Output   2725 ml  Net  -2145 ml   Filed Weights   08/13/13 0203 08/13/13 0600 08/14/13 0612  Weight: 61.6 kg (135 lb 12.9 oz) 61.4 kg (135 lb 5.8 oz) 62.5 kg (137 lb 12.6 oz)    Exam:  General: Alert, awake, oriented x3, in no acute distress.  HEENT: No bruits, no goiter. +JVD Heart: Regular rate and rhythm, without murmurs, rubs, gallops.  Lungs: Good air movement, clear to auscultation Abdomen: Soft, nontender, nondistended, positive bowel sounds.     Data Reviewed: Basic Metabolic  Panel:  Recent Labs Lab 08/12/13 1803 08/13/13 0535  NA 137 141  K 3.6 3.0*  CL 102 106  CO2  --  23  GLUCOSE 374* 80  BUN 46* 45*  CREATININE 2.00* 1.86*  CALCIUM  --  8.5   Liver Function Tests: No results found for this basename: AST, ALT, ALKPHOS, BILITOT, PROT, ALBUMIN,  in the last 168 hours No results found for this basename: LIPASE, AMYLASE,  in the last 168 hours No results found for this basename: AMMONIA,  in the last 168 hours CBC:  Recent Labs Lab 08/12/13 1724 08/12/13 1803 08/13/13 0535  WBC 7.4  --  5.2  HGB 9.2* 9.5* 8.6*  HCT 26.5* 28.0* 25.2*  MCV 88.0  --  89.0  PLT 231  --  193   Cardiac Enzymes:  Recent Labs Lab 08/12/13 1725 08/12/13 2250  TROPONINI <0.30 <0.30   BNP (last 3 results)  Recent Labs  02/09/13 1230 02/13/13 1145 02/14/13 0540  PROBNP 2537.0* 2031.0* 1703.0*   CBG:  Recent Labs Lab 08/12/13 2105 08/13/13 0557 08/13/13 1306 08/13/13 1554 08/13/13 2145  GLUCAP 280* 77 128* 93 181*    No results found for this or any previous visit (from the past 240 hour(s)).   Studies: Dg Chest 2 View  08/12/2013   *RADIOLOGY REPORT*  Clinical Data:  Short of breath  CHEST - 2 VIEW  Comparison: 02/12/2013  Findings: Normal cardiac silhouette.  There is fine interstitial pattern with a linear markings laterally.  Breast tissue node on the left and right.  There is blunting of the costophrenic angles slightly improved compared to prior.  IMPRESSION: Small bilateral pleural effusions and interstitial edema.   Original Report Authenticated By: Genevive Bi, M.D.    Scheduled Meds: . aspirin EC  81 mg Oral Daily  . carvedilol  12.5 mg Oral BID WC  . furosemide  60 mg Intravenous Q8H  . insulin aspart  0-5 Units Subcutaneous QHS  . insulin aspart  0-9 Units Subcutaneous TID WC  . insulin aspart  3 Units Subcutaneous TID WC  . insulin glargine  10 Units Subcutaneous QHS  . isosorbide-hydrALAZINE  1 tablet Oral BID  .  levothyroxine  75 mcg Oral QAC breakfast  . nitroGLYCERIN  0.5 inch Topical Q8H  . pantoprazole  40 mg Oral Daily  . simvastatin  40 mg Oral q1800  . sodium chloride  3 mL Intravenous Q12H  . sodium chloride  3 mL Intravenous Q12H  . traMADol  100 mg Oral BID   Continuous Infusions:    Marinda Elk  [facility] Pager [phone]. If 8PM-8AM, please contact night-coverage at [email or web address], password [facility] 08/14/2013, 7:45 AM  LOS: 2 days

## 2013-08-14 NOTE — Progress Notes (Signed)
Utilization Review Completed.   Makylee Sanborn, RN, BSN Nurse Case Manager  [phone]  

## 2013-08-14 NOTE — Progress Notes (Signed)
Met with patient at bedside to discuss [facility] Care Management services. Discussed how [facility] Care Management has to reach her multiple times in the recent past. States she remembers but is not interested in [facility] Care Management at this time. Left brochure for her to call in case she changes her mind in the future. Raiford Noble, MSN- Ed, Charity fundraiser, BSN- [facility] [phone]

## 2013-08-14 NOTE — Plan of Care (Signed)
Problem: Phase I Progression Outcomes Goal: Voiding-avoid urinary catheter unless indicated Outcome: Completed/Met Date Met:  08/14/13 Pt OOB to bathroom and BSC with walker and standby assist to void

## 2013-08-15 DIAGNOSIS — M79609 Pain in unspecified limb: Secondary | ICD-10-CM

## 2013-08-15 DIAGNOSIS — R0989 Other specified symptoms and signs involving the circulatory and respiratory systems: Secondary | ICD-10-CM

## 2013-08-15 DIAGNOSIS — M7989 Other specified soft tissue disorders: Secondary | ICD-10-CM

## 2013-08-15 LAB — BASIC METABOLIC PANEL
CO2: 25 mEq/L (ref 19–32)
Calcium: 8.3 mg/dL — ABNORMAL LOW (ref 8.4–10.5)
Creatinine, Ser: 2.3 mg/dL — ABNORMAL HIGH (ref 0.50–1.10)
GFR calc Af Amer: 23 mL/min — ABNORMAL LOW (ref >90)
GFR calc non Af Amer: 19 mL/min — ABNORMAL LOW (ref >90)
Sodium: 137 mEq/L (ref 135–145)

## 2013-08-15 LAB — GLUCOSE, CAPILLARY
Glucose-Capillary: 83 mg/dL (ref 70–99)
Glucose-Capillary: 296 mg/dL — ABNORMAL HIGH (ref 70–99)
Glucose-Capillary: 117 mg/dL — ABNORMAL HIGH (ref 70–99)

## 2013-08-15 LAB — OCCULT BLOOD X 1 CARD TO LAB, STOOL: Fecal Occult Bld: NEGATIVE

## 2013-08-15 MED ORDER — ISOSORB DINITRATE-HYDRALAZINE 20-37.5 MG PO TABS
2.0000 | ORAL_TABLET | Freq: Three times a day (TID) | ORAL | Status: DC
  Filled 2013-08-15 (×2): qty 2

## 2013-08-15 MED ORDER — INFLUENZA VAC SPLIT QUAD 0.5 ML IM SUSP
0.5000 mL | INTRAMUSCULAR | Status: AC
  Administered 2013-08-16: 0.5 mL via INTRAMUSCULAR
  Filled 2013-08-15 (×2): qty 0.5

## 2013-08-15 MED ORDER — ATORVASTATIN CALCIUM 20 MG PO TABS
20.0000 mg | ORAL_TABLET | Freq: Every day | ORAL | Status: DC
  Administered 2013-08-15 – 2013-08-21 (×7): 20 mg via ORAL
  Filled 2013-08-15 (×8): qty 1

## 2013-08-15 MED ORDER — LOPERAMIDE HCL 2 MG PO CAPS
4.0000 mg | ORAL_CAPSULE | Freq: Once | ORAL | Status: AC
  Administered 2013-08-15: 4 mg via ORAL
  Filled 2013-08-15: qty 2

## 2013-08-15 MED ORDER — AMLODIPINE BESYLATE 2.5 MG PO TABS
2.5000 mg | ORAL_TABLET | Freq: Every day | ORAL | Status: DC
  Administered 2013-08-15 – 2013-08-16 (×2): 2.5 mg via ORAL
  Filled 2013-08-15 (×2): qty 1

## 2013-08-15 MED ORDER — ISOSORB DINITRATE-HYDRALAZINE 20-37.5 MG PO TABS
1.0000 | ORAL_TABLET | Freq: Three times a day (TID) | ORAL | Status: DC
  Administered 2013-08-15 – 2013-08-22 (×19): 1 via ORAL
  Filled 2013-08-15 (×23): qty 1

## 2013-08-15 NOTE — Progress Notes (Addendum)
[facility] PROGRESS NOTE  Assessment/Plan: Acute diastolic heart failure/Chronic diastolic heart failure: - 61.4 kg on admission. ? Weight as it has gone up. - Hold lasix, monitor electrolytes, replete as needed. Norvasc added by cards. - Cont. Coreg and bidil. - ? Dry weight is 57. Kg. - Negative about 2.0 L, b-met pending. - oral hydration. Check orhtostatic.  ANEMIA OF CHRONIC DISEASE/GFR < 50 : - Mild drop in Hbg. - no overt sign of bleeding., Guiac stools neagtive, follow up as an outpatinet.  Acute on Chronic kidney disease III: - ? basleine Cr 1.3-1.5.  Mild increase hold diuretics and ACE-I.  Type 2 diabetes mellitus: - Improved with Lantus. - Cont SSI.  PAD (peripheral artery disease): - ASA, axs.   HYPERTENSION - BP mildly elevated. - increase bidil TID.     Code Status: full Family Communication: none  Disposition Plan: inpatinet   Consultants:  cardiology  Procedures:  echo  Antibiotics:  None  HPI/Subjective: No improved.  Objective: Filed Vitals:   08/14/13 1439 08/14/13 1948 08/15/13 0529 08/15/13 0931  BP: 166/70 164/43 142/41 171/46  Pulse: 54 59 58 56  Temp: 97 F (36.1 C) 98 F (36.7 C) 98.4 F (36.9 C)   TempSrc: Oral Oral Oral   Resp: 16 18 19 18   Height:      Weight:   61.1 kg (134 lb 11.2 oz)   SpO2: 97% 96% 95% 99%    Intake/Output Summary (Last 24 hours) at 08/15/13 1210 Last data filed at 08/15/13 0901  Gross per 24 hour  Intake   1140 ml  Output   1200 ml  Net    -60 ml   Filed Weights   08/13/13 0600 08/14/13 0612 08/15/13 0529  Weight: 61.4 kg (135 lb 5.8 oz) 62.5 kg (137 lb 12.6 oz) 61.1 kg (134 lb 11.2 oz)    Exam:  General: Alert, awake, oriented x3, in no acute distress.  HEENT: No bruits, no goiter. +JVD Heart: Regular rate and rhythm, without murmurs, rubs, gallops.  Lungs: Good air movement, clear to auscultation Abdomen: Soft, nontender, nondistended, positive bowel sounds.     Data  Reviewed: Basic Metabolic Panel:  Recent Labs Lab 08/12/13 1803 08/13/13 0535 08/14/13 0648 08/15/13 0500  NA 137 141 137 137  K 3.6 3.0* 4.3 4.3  CL 102 106 105 104  CO2  --  23 23 25   GLUCOSE 374* 80 112* 90  BUN 46* 45* 42* 45*  CREATININE 2.00* 1.86* 2.03* 2.30*  CALCIUM  --  8.5 8.4 8.3*   Liver Function Tests: No results found for this basename: AST, ALT, ALKPHOS, BILITOT, PROT, ALBUMIN,  in the last 168 hours No results found for this basename: LIPASE, AMYLASE,  in the last 168 hours No results found for this basename: AMMONIA,  in the last 168 hours CBC:  Recent Labs Lab 08/12/13 1724 08/12/13 1803 08/13/13 0535 08/14/13 0648  WBC 7.4  --  5.2 5.2  HGB 9.2* 9.5* 8.6* 8.4*  HCT 26.5* 28.0* 25.2* 25.2*  MCV 88.0  --  89.0 89.0  PLT 231  --  193 209   Cardiac Enzymes:  Recent Labs Lab 08/12/13 1725 08/12/13 2250  TROPONINI <0.30 <0.30   BNP (last 3 results)  Recent Labs  02/09/13 1230 02/13/13 1145 02/14/13 0540  PROBNP 2537.0* 2031.0* 1703.0*   CBG:  Recent Labs Lab 08/14/13 1129 08/14/13 1639 08/14/13 2104 08/15/13 0537 08/15/13 1114  GLUCAP 98 190* 230* 83 296*  No results found for this or any previous visit (from the past 240 hour(s)).   Studies: No results found.  Scheduled Meds: . amLODipine  2.5 mg Oral Daily  . aspirin EC  81 mg Oral Daily  . atorvastatin  20 mg Oral q1800  . carvedilol  12.5 mg Oral BID WC  . insulin aspart  0-5 Units Subcutaneous QHS  . insulin aspart  0-9 Units Subcutaneous TID WC  . insulin aspart  3 Units Subcutaneous TID WC  . insulin glargine  20 Units Subcutaneous QHS  . isosorbide-hydrALAZINE  2 tablet Oral TID  . levothyroxine  75 mcg Oral QAC breakfast  . nitroGLYCERIN  0.5 inch Topical Q8H  . pantoprazole  40 mg Oral Daily  . sodium chloride  3 mL Intravenous Q12H  . traMADol  100 mg Oral BID   Continuous Infusions:    Marinda Elk  [facility] Pager [phone]. If  8PM-8AM, please contact night-coverage at [email or web address], password [email or web address] 08/15/2013, 12:10 PM  LOS: 3 days

## 2013-08-15 NOTE — Progress Notes (Signed)
Pt states her left hand gets a "numb" feeling when her blood sugar is low. Pt states he son has the same symptom. Pt CBG checked and CBG 83. Pt states that is low and requested orange juice. Pt given orange juice. Will continue to monitor. Baron Hamper, RN

## 2013-08-15 NOTE — Progress Notes (Addendum)
*  PRELIMINARY RESULTS* Vascular Ultrasound Bilateral Lower Extremity Arterial Duplex has been completed.  Findings consistent with >50% stenosis of the right common femoral artery into the right profunda femoral and femoral arteries. There is some collateral flow noted.   Bilateral ABIs are suggestive of moderate arterial insufficiency.   Left lower extremity venous duplex completed. Left lower extremity is negative for deep vein thrombosis. There is no evidence of left Baker's cyst.  08/15/2013 2:51 PM  Gertie Fey, RVT, RDCS, RDMS

## 2013-08-15 NOTE — Progress Notes (Signed)
Patient Name: Nicole [name] Date of Encounter: 08/15/2013   Principal Problem:   Acute diastolic heart failure Active Problems:   ANEMIA OF CHRONIC DISEASE   HYPERTENSION   Chronic diastolic heart failure   PAD (peripheral artery disease)   Type 2 diabetes mellitus   Chronic kidney disease, stage III (moderate)   AKI (acute kidney injury)    SUBJECTIVE  The patient complains of left calf pain  CURRENT MEDS . aspirin EC  81 mg Oral Daily  . carvedilol  12.5 mg Oral BID WC  . furosemide  20 mg Intravenous Daily  . insulin aspart  0-5 Units Subcutaneous QHS  . insulin aspart  0-9 Units Subcutaneous TID WC  . insulin aspart  3 Units Subcutaneous TID WC  . insulin glargine  20 Units Subcutaneous QHS  . isosorbide-hydrALAZINE  1 tablet Oral TID  . levothyroxine  75 mcg Oral QAC breakfast  . nitroGLYCERIN  0.5 inch Topical Q8H  . pantoprazole  40 mg Oral Daily  . simvastatin  40 mg Oral q1800  . sodium chloride  3 mL Intravenous Q12H  . traMADol  100 mg Oral BID    OBJECTIVE  Filed Vitals:   08/14/13 1105 08/14/13 1439 08/14/13 1948 08/15/13 0529  BP: 165/40 166/70 164/43 142/41  Pulse: 55 54 59 58  Temp:  97 F (36.1 C) 98 F (36.7 C) 98.4 F (36.9 C)  TempSrc:  Oral Oral Oral  Resp: 16 16 18 19   Height:      Weight:    134 lb 11.2 oz (61.1 kg)  SpO2: 97% 97% 96% 95%    Intake/Output Summary (Last 24 hours) at 08/15/13 0844 Last data filed at 08/15/13 0535  Gross per 24 hour  Intake    900 ml  Output   1200 ml  Net   -300 ml   Filed Weights   08/13/13 0600 08/14/13 0612 08/15/13 0529  Weight: 135 lb 5.8 oz (61.4 kg) 137 lb 12.6 oz (62.5 kg) 134 lb 11.2 oz (61.1 kg)    PHYSICAL EXAM  General: Pleasant, NAD. Neuro: Alert and oriented X 3. Moves all extremities spontaneously. Psych: Normal affect. HEENT:  Normal  Neck: Supple without bruits or JVD. Lungs:  Resp regular and unlabored, CTA. Heart: RRR no s3, s4, or murmurs. Abdomen: Soft,  non-tender, non-distended, BS + x 4.  Extremities: No clubbing, cyanosis, mild pitting edema L>R, tenderness of the left calf, Radials 2+ and equal bilaterally, DP/PT 1+, extremities warm  Accessory Clinical Findings  CBC  Recent Labs  08/13/13 0535 08/14/13 0648  WBC 5.2 5.2  HGB 8.6* 8.4*  HCT 25.2* 25.2*  MCV 89.0 89.0  PLT 193 209   Basic Metabolic Panel  Recent Labs  08/14/13 0648 08/15/13 0500  NA 137 137  K 4.3 4.3  CL 105 104  CO2 23 25  GLUCOSE 112* 90  BUN 42* 45*  CREATININE 2.03* 2.30*  CALCIUM 8.4 8.3*   Liver Function Tests No results found for this basename: AST, ALT, ALKPHOS, BILITOT, PROT, ALBUMIN,  in the last 72 hours No results found for this basename: LIPASE, AMYLASE,  in the last 72 hours Cardiac Enzymes  Recent Labs  08/12/13 1725 08/12/13 2250  TROPONINI <0.30 <0.30   BNP No components found with this basename: POCBNP,  D-Dimer No results found for this basename: DDIMER,  in the last 72 hours Hemoglobin A1C  Recent Labs  08/13/13 0535  HGBA1C 11.1*   Fasting Lipid Panel  No results found for this basename: CHOL, HDL, LDLCALC, TRIG, CHOLHDL, LDLDIRECT,  in the last 72 hours Thyroid Function Tests No results found for this basename: TSH, T4TOTAL, FREET3, T3FREE, THYROIDAB,  in the last 72 hours  TELE  Sinus bradycardia   Radiology/Studies  Dg Chest 2 View  08/12/2013   *RADIOLOGY REPORT*  Clinical Data: Short of breath  CHEST - 2 VIEW  Comparison: 02/12/2013  Findings: Normal cardiac silhouette.  There is fine interstitial pattern with a linear markings laterally.  Breast tissue node on the left and right.  There is blunting of the costophrenic angles slightly improved compared to prior.  IMPRESSION: Small bilateral pleural effusions and interstitial edema.   Original Report Authenticated By: Genevive Bi, M.D.    ASSESSMENT AND PLAN  ASSESSMENT AND PLAN:  Nicole [name] is a 78 year old female with multiple medical problems  including CKD, CAD and HFpEF admitted for acute on chronic heart failure and chest pain with ECG changes in the settings of hypertensive urgency.   1. Acute on chronic diastolic heart failure-- - decrease Lasix to 20 mg iv daily - 2 gram sodium diet  - aggressive BP control will be important  - add amodipine 2.5 mg PO QD  2. CAD - chest pain, negative Troponin, now pain free  Her most recent anatomic evaluation of the coronaries was 2012. She did have a heavily calcified circumflex system. I  Given her renal failure and advanced age, I would recommend that medical therapy be our initial strategy. Once her BP is adequately controlled, outpatient functional testing may be reasonable. Would reserve cath until medical options have been exhausted.   3. Hypertension-  - continue Coreg and Bidil  - add Amlodipine  -  4. CKD  - decreased dose of Lasix as Crea rising   - Follow renal function closely while diuresis  -  Aggressive BP control is necessary   5. Anemia  She reports that this has been an issue for several years. She has not had a colonoscopy. Additional outpatient workup is needed.    Signed, Lars Masson MD

## 2013-08-15 NOTE — Progress Notes (Signed)
Physical Therapy Treatment Patient Details Name: Nicole [name] MRN: [id number] DOB: 1935-11-04 Today's Date: 08/15/2013 Time: 1200-1217 PT Time Calculation (min): 17 min  PT Assessment / Plan / Recommendation  History of Present Illness 78 yo female h/o dm, htn, diastolic chf comes in with one week of worsening wt gain, and sob.  Today she started having sscp no radiation which was relieved after taking 2 ntg at home and asa.  She is normally on lasix 80 mg daily.  She increased her home dose of lasix several days ago due to increasing sob, and lower ext edema.  Since increasing her home lasix she has lost 7 lbs and her sob has markedly improved and her le edema has resolved.  However today when she started with cp she got concerned and came to ED.  She is cp free now.  No fevers.  No cough.  She has also had mildly increased worsening of her renal funcition.  uop is normal   PT Comments   Pt functioning near baseline however suspect pt mildly unsteady at baseline. Pt adamant to go home. Pt functioning at supervision currently with straight cane. Suspect pt to be mod I with further acute PT for safe d/c home alone.  Follow Up Recommendations  No PT follow up;Supervision - Intermittent     Does the patient have the potential to tolerate intense rehabilitation     Barriers to Discharge        Equipment Recommendations   (tub bench)    Recommendations for Other Services    Frequency Min 3X/week   Progress towards PT Goals Progress towards PT goals: Progressing toward goals  Plan Current plan remains appropriate    Precautions / Restrictions Precautions Precautions: Fall Restrictions Weight Bearing Restrictions: No   Pertinent Vitals/Pain Reports L hip/back pain    Mobility  Bed Mobility Bed Mobility: Supine to Sit Supine to Sit: 6: Modified independent (Device/Increase time);HOB flat (pulled up on mattress) Details for Bed Mobility Assistance: increased time, labored  effort Transfers Transfers: Sit to Stand;Stand to Sit Sit to Stand: 5: Supervision;With upper extremity assist;From bed Stand to Sit: 5: Supervision;With upper extremity assist;To bed Details for Transfer Assistance: increased time due to L hip/low back pain Ambulation/Gait Ambulation/Gait Assistance: 5: Supervision Ambulation Distance (Feet): 150 Feet Assistive device: Rolling walker;Straight cane Ambulation/Gait Assistance Details: pt able to amb with straight cane without LOB however remains mildly unsteady Gait Pattern: Step-through pattern;Decreased stride length Gait velocity: wfl Stairs: Yes Stairs Assistance: 4: Min Editor, commissioning Details (indicate cue type and reason): L HHA and rail Stair Management Technique: One rail Right;Step to pattern Number of Stairs: 3    Exercises     PT Diagnosis:    PT Problem List:   PT Treatment Interventions:     PT Goals (current goals can now be found in the care plan section) Acute Rehab PT Goals Patient Stated Goal: to return home  Visit Information  Last PT Received On: 08/15/13 Assistance Needed: +1 History of Present Illness: 78 yo female h/o dm, htn, diastolic chf comes in with one week of worsening wt gain, and sob.  Today she started having sscp no radiation which was relieved after taking 2 ntg at home and asa.  She is normally on lasix 80 mg daily.  She increased her home dose of lasix several days ago due to increasing sob, and lower ext edema.  Since increasing her home lasix she has lost 7 lbs and her sob has  markedly improved and her le edema has resolved.  However today when she started with cp she got concerned and came to ED.  She is cp free now.  No fevers.  No cough.  She has also had mildly increased worsening of her renal funcition.  uop is normal    Subjective Data  Subjective: "They didn't find a clot in my leg." Patient Stated Goal: to return home   Cognition  Cognition Arousal/Alertness:  Awake/alert Behavior During Therapy: WFL for tasks assessed/performed Overall Cognitive Status: Within Functional Limits for tasks assessed    Balance     End of Session PT - End of Session Equipment Utilized During Treatment: Gait belt Activity Tolerance: Patient tolerated treatment well Patient left: in chair;with call bell/phone within reach Nurse Communication: Mobility status   GP     Marcene Brawn 08/15/2013, 1:27 PM  Lewis Shock, PT, DPT Pager #: [phone] Office #: [phone]

## 2013-08-16 ENCOUNTER — Inpatient Hospital Stay (HOSPITAL_COMMUNITY): Payer: Medicare Other

## 2013-08-16 LAB — COMPREHENSIVE METABOLIC PANEL
ALT: 9 U/L (ref 0–35)
AST: 15 U/L (ref 0–37)
Albumin: 2.1 g/dL — ABNORMAL LOW (ref 3.5–5.2)
Alkaline Phosphatase: 98 U/L (ref 39–117)
BUN: 49 mg/dL — ABNORMAL HIGH (ref 6–23)
CO2: 23 mEq/L (ref 19–32)
Calcium: 8.2 mg/dL — ABNORMAL LOW (ref 8.4–10.5)
Chloride: 101 mEq/L (ref 96–112)
Creatinine, Ser: 2.37 mg/dL — ABNORMAL HIGH (ref 0.50–1.10)
GFR calc Af Amer: 22 mL/min — ABNORMAL LOW (ref >90)
GFR calc non Af Amer: 19 mL/min — ABNORMAL LOW (ref >90)
Glucose, Bld: 254 mg/dL — ABNORMAL HIGH (ref 70–99)
Potassium: 4.9 mEq/L (ref 3.5–5.1)
Sodium: 133 mEq/L — ABNORMAL LOW (ref 135–145)
Total Bilirubin: 0.2 mg/dL — ABNORMAL LOW (ref 0.3–1.2)
Total Protein: 5.1 g/dL — ABNORMAL LOW (ref 6.0–8.3)

## 2013-08-16 LAB — GLUCOSE, CAPILLARY
Glucose-Capillary: 81 mg/dL (ref 70–99)
Glucose-Capillary: 137 mg/dL — ABNORMAL HIGH (ref 70–99)
Glucose-Capillary: 326 mg/dL — ABNORMAL HIGH (ref 70–99)
Glucose-Capillary: 212 mg/dL — ABNORMAL HIGH (ref 70–99)

## 2013-08-16 LAB — SODIUM, URINE, RANDOM: Sodium, Ur: 16 mEq/L

## 2013-08-16 LAB — CBC
HCT: 25.4 % — ABNORMAL LOW (ref 36.0–46.0)
Hemoglobin: 8.6 g/dL — ABNORMAL LOW (ref 12.0–15.0)
MCH: 30.3 pg (ref 26.0–34.0)
MCHC: 33.9 g/dL (ref 30.0–36.0)
MCV: 89.4 fL (ref 78.0–100.0)
Platelets: 243 10*3/uL (ref 150–400)
RBC: 2.84 MIL/uL — ABNORMAL LOW (ref 3.87–5.11)
RDW: 13.3 % (ref 11.5–15.5)
WBC: 6.6 10*3/uL (ref 4.0–10.5)

## 2013-08-16 LAB — CREATININE, URINE, RANDOM: Creatinine, Urine: 59.22 mg/dL

## 2013-08-16 MED ORDER — INSULIN GLARGINE 100 UNIT/ML ~~LOC~~ SOLN
20.0000 [IU] | Freq: Every day | SUBCUTANEOUS | Status: DC
Start: 2013-08-16 — End: 2013-08-22
  Administered 2013-08-17 – 2013-08-21 (×4): 20 [IU] via SUBCUTANEOUS
  Filled 2013-08-16 (×7): qty 0.2

## 2013-08-16 MED ORDER — GLUCOSE 40 % PO GEL
ORAL | Status: AC
  Administered 2013-08-16: 11:00:00 37.5 g
  Filled 2013-08-16: qty 1

## 2013-08-16 MED ORDER — AMLODIPINE BESYLATE 5 MG PO TABS
5.0000 mg | ORAL_TABLET | Freq: Every day | ORAL | Status: DC
  Administered 2013-08-16: 2.5 mg via ORAL
  Administered 2013-08-17: 5 mg via ORAL
  Filled 2013-08-16 (×3): qty 1

## 2013-08-16 MED ORDER — ALBUTEROL SULFATE (5 MG/ML) 0.5% IN NEBU
5.0000 mg | INHALATION_SOLUTION | Freq: Once | RESPIRATORY_TRACT | Status: AC
  Administered 2013-08-16: 5 mg via RESPIRATORY_TRACT
  Filled 2013-08-16: qty 1

## 2013-08-16 MED ORDER — INSULIN GLARGINE 100 UNIT/ML ~~LOC~~ SOLN
10.0000 [IU] | Freq: Every day | SUBCUTANEOUS | Status: DC
  Filled 2013-08-16: qty 0.1

## 2013-08-16 MED ORDER — GLUCOSE 40 % PO GEL: 1.0000 | ORAL | Status: DC | PRN

## 2013-08-16 MED ORDER — SODIUM CHLORIDE 0.9 % IV SOLN
INTRAVENOUS | Status: AC
  Administered 2013-08-16: 75 mL/h via INTRAVENOUS

## 2013-08-16 NOTE — Progress Notes (Signed)
Patient Name: Nicole [name] Date of Encounter: 08/16/2013    Principal Problem:   Acute diastolic heart failure Active Problems:   ANEMIA OF CHRONIC DISEASE   HYPERTENSION   Chronic diastolic heart failure   PAD (peripheral artery disease)   Type 2 diabetes mellitus   Chronic kidney disease, stage III (moderate)   AKI (acute kidney injury)  SUBJECTIVE    CURRENT MEDS . amLODipine  2.5 mg Oral Daily  . aspirin EC  81 mg Oral Daily  . atorvastatin  20 mg Oral q1800  . carvedilol  12.5 mg Oral BID WC  . influenza vac split quadrivalent PF  0.5 mL Intramuscular Tomorrow-1000  . insulin aspart  0-5 Units Subcutaneous QHS  . insulin aspart  0-9 Units Subcutaneous TID WC  . insulin aspart  3 Units Subcutaneous TID WC  . insulin glargine  20 Units Subcutaneous QHS  . isosorbide-hydrALAZINE  1 tablet Oral TID  . levothyroxine  75 mcg Oral QAC breakfast  . nitroGLYCERIN  0.5 inch Topical Q8H  . pantoprazole  40 mg Oral Daily  . sodium chloride  3 mL Intravenous Q12H  . traMADol  100 mg Oral BID    OBJECTIVE  Filed Vitals:   08/15/13 1452 08/15/13 1455 08/15/13 2047 08/16/13 0530  BP: 158/33 159/37 138/34 163/30  Pulse: 55 55 53 56  Temp:   97.6 F (36.4 C) 98.4 F (36.9 C)  TempSrc:   Oral Oral  Resp:   18 19  Height:      Weight:    135 lb 12.8 oz (61.598 kg)  SpO2:   95% 98%    Intake/Output Summary (Last 24 hours) at 08/16/13 0946 Last data filed at 08/16/13 0404  Gross per 24 hour  Intake    860 ml  Output    550 ml  Net    310 ml   Filed Weights   08/14/13 0612 08/15/13 0529 08/16/13 0530  Weight: 137 lb 12.6 oz (62.5 kg) 134 lb 11.2 oz (61.1 kg) 135 lb 12.8 oz (61.598 kg)    PHYSICAL EXAM General: Pleasant, NAD.  Neuro: Alert and oriented X 3. Moves all extremities spontaneously.  Psych: Normal affect.  HEENT: Normal  Neck: Supple without bruits or JVD.  Lungs: Resp regular and unlabored, CTA.  Heart: RRR no s3, s4, or murmurs.  Abdomen:  Soft, non-tender, non-distended, BS + x 4.  Extremities: No clubbing, cyanosis, mild pitting edema L>R, tenderness of the left calf, Radials 2+ and equal bilaterally, DP/PT weak, extremities warm  Accessory Clinical Findings  CBC  Recent Labs  08/14/13 0648  WBC 5.2  HGB 8.4*  HCT 25.2*  MCV 89.0  PLT 209   Basic Metabolic Panel  Recent Labs  08/15/13 0500 08/16/13 0430  NA 137 133*  K 4.3 4.9  CL 104 101  CO2 25 23  GLUCOSE 90 254*  BUN 45* 49*  CREATININE 2.30* 2.37*  CALCIUM 8.3* 8.2*   Liver Function Tests  Recent Labs  08/16/13 0430  AST 15  ALT 9  ALKPHOS 98  BILITOT 0.2*  PROT 5.1*  ALBUMIN 2.1*    TELE  SR, HR 56/min  TTE: Study Conclusions  - Left ventricle: The cavity size was normal. Systolic function was vigorous. The estimated ejection fraction was in the range of 65% to 70%. Wall motion was normal; there were no regional wall motion abnormalities. Features are consistent with a pseudonormal left ventricular filling pattern, with concomitant abnormal relaxation and  increased filling pressure (grade 2 diastolic dysfunction). - Mitral valve: Calcified annulus. Mild regurgitation. - Left atrium: The atrium was mildly dilated. - Right atrium: The atrium was mildly dilated. Impressions:  - Compared to the prior study, there has been no significant interval change.  Arterial Duplex Lower Extremities: Summary: Findings consistent with >50% stenosis of the right common femoral artery into the right profunda femoral and femoral arteries. There is somecollateral flow noted.  Bilateral ABIs are suggestive of moderate arterial insufficiency.  Radiology/Studies  Dg Chest 2 View  08/12/2013   *RADIOLOGY REPORT*  Clinical Data: Short of breath  CHEST - 2 VIEW  Comparison: 02/12/2013  Findings: Normal cardiac silhouette.  There is fine interstitial pattern with a linear markings laterally.  Breast tissue node on the left and right.  There is  blunting of the costophrenic angles slightly improved compared to prior.  IMPRESSION: Small bilateral pleural effusions and interstitial edema.   Original Report Authenticated By: Nicole [name], M.D.    ASSESSMENT AND PLAN  Ms Mood is a 78 year old female with multiple medical problems including CKD, CAD and HFpEF admitted for acute on chronic heart failure and chest pain with ECG changes in the settings of hypertensive urgency.   1. Acute on chronic diastolic heart failure--  - Crea rising, d/c Lasix - 2 gram sodium diet   2. CAD - chest pain, negative Troponin, now pain free  Her most recent anatomic evaluation of the coronaries was 2012. She did have a heavily calcified circumflex system. I  Given her renal failure and advanced age, I would recommend that medical therapy be our initial strategy. Once her BP is adequately controlled, outpatient functional testing may be reasonable. Would reserve cath until medical options have been exhausted.  - continue apirin and high dose statin  3. Hypertension-  - Continue Amlodipine, BiDil, Coreg, hold ACEI   4. Peripheral arterial disease in lower extremities extremities and >50% stenosis of the right common femoral artery into the right profunda femoral and femoral arteries. There is somecollateral flow noted. Unfortunately, because of her AKI she is not a good candidate for an intervention right now. We will refer her to the interventionalists once her kidney function improves.     Nicole [name], Nicole Edison MD

## 2013-08-16 NOTE — Progress Notes (Signed)
Pt complaining of multiple instances of diarrhea since dinner. MD notified and new order for 1 time dose of imodium. Baron Hamper, RN

## 2013-08-16 NOTE — Progress Notes (Signed)
Cosigned Teal Curry's assessment, I/O, med administration, care plan/education.  Lorretta Harp RN

## 2013-08-16 NOTE — Progress Notes (Signed)
Rechecked CBG was 81. Patient states she is feeling better.  MD made aware of current CBG. MD ordered for CBG to be rechecked in 1 hour. Patient stable, will continue to monitor. Stanton Kidney R

## 2013-08-16 NOTE — Progress Notes (Signed)
CRITICAL VALUE ALERT  Critical value received:  44  Date of notification:  08-16-2013  Time of notification:  1100  Critical value read back:yes  Nurse who received alert:  Stanton Kidney RN/Merdis Snodgrass Manson Passey RN  MD notified (1st page):  Short MD  Time of first page:  1118  MD notified (2nd page):  Time of second page:  Responding MD:  Short MD  Time MD responded:  [phone]

## 2013-08-16 NOTE — Progress Notes (Signed)
PT Cancellation Note  Patient Details Name: Nicole [name] MRN: [id number] DOB: February 19, 1935   Cancelled Treatment:    Reason Eval/Treat Not Completed: Patient at procedure or test/unavailable     Verdell Face, PTA [phone] 08/16/2013

## 2013-08-16 NOTE — Progress Notes (Signed)
Patient complained of not feeling well and lightheaded. CBG was 44. Gave patient Glutose 15 along with milk and gram crackers and peanut butter. Will recheck CBG within 15 minutes. Continuing to monitor.

## 2013-08-16 NOTE — Progress Notes (Addendum)
[facility] PROGRESS NOTE  Assessment/Plan:  Acute diastolic heart failure/Chronic diastolic heart failure, Dry weight is 57. Kg. -  weight continues to rise while holding diuretics, increase by 0.5 kg and + since yesterday - Cont. 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   -  Renal US:  No obstruction -  FENa:  Prerenal  -  Gentle IVF overnight and reassess creatinine in AM -  Repeat in AM  -  Renally dose medications and minimize nephrotoxins  Diarrhea overnight with abdominal cramping, resolving -  C. Diff with enteric precautions  ANEMIA OF CHRONIC DISEASE/GFR < 50 (renal parenchymal disease) - Mild drop in Hbg. - no overt sign of bleeding   Type 2 diabetes mellitus: - Continue Lantus and SSI.  Yesterday AM fingerstick 83, today 220?  PAD (peripheral artery disease):  > 50% stenosis/moderate PAD on [place].   - ASA, axs. - Interventional cardiology once renal function improves   HYPERTENSION - BP mildly elevated. - Continue bidil TID.  Code Status: full Family Communication: none  Disposition Plan: inpatient   Consultants:  cardiology  Procedures:  echo  Antibiotics:  None  HPI/Subjective: Feels somewhat better today but still SOB with exertion, some lower extremity swelling.    Objective: Filed Vitals:   08/15/13 1452 08/15/13 1455 08/15/13 2047 08/16/13 0530  BP: 158/33 159/37 138/34 163/30  Pulse: 55 55 53 56  Temp:   97.6 F (36.4 C) 98.4 F (36.9 C)  TempSrc:   Oral Oral  Resp:   18 19  Height:      Weight:    61.598 kg (135 lb 12.8 oz)  SpO2:   95% 98%    Intake/Output Summary (Last 24 hours) at 08/16/13 0938 Last data filed at 08/16/13 0404  Gross per 24 hour  Intake    860 ml  Output    550 ml  Net    310 ml   Filed Weights   08/14/13 0612 08/15/13 0529 08/16/13 0530  Weight: 62.5 kg (137 lb 12.6 oz) 61.1 kg (134 lb 11.2 oz) 61.598 kg (135 lb  12.8 oz)    Exam:  General: Alert, awake, oriented x3, in no acute distress.  HEENT: No bruits, no goiter. +JVD Heart: Regular rate and rhythm, without murmurs, rubs, gallops.  Lungs: Good air movement, clear to auscultation Abdomen: Soft, nontender, nondistended, positive bowel sounds.     Data Reviewed: Basic Metabolic Panel:  Recent Labs Lab 08/12/13 1803 08/13/13 0535 08/14/13 0648 08/15/13 0500 08/16/13 0430  NA 137 141 137 137 133*  K 3.6 3.0* 4.3 4.3 4.9  CL 102 106 105 104 101  CO2  --  23 23 25 23   GLUCOSE 374* 80 112* 90 254*  BUN 46* 45* 42* 45* 49*  CREATININE 2.00* 1.86* 2.03* 2.30* 2.37*  CALCIUM  --  8.5 8.4 8.3* 8.2*   Liver Function Tests:  Recent Labs Lab 08/16/13 0430  AST 15  ALT 9  ALKPHOS 98  BILITOT 0.2*  PROT 5.1*  ALBUMIN 2.1*   No results found for this basename: LIPASE, AMYLASE,  in the last 168 hours No results found for this basename: AMMONIA,  in the last 168 hours CBC:  Recent Labs Lab 08/12/13 1724 08/12/13 1803 08/13/13 0535 08/14/13 0648  WBC 7.4  --  5.2 5.2  HGB 9.2* 9.5* 8.6* 8.4*  HCT 26.5* 28.0* 25.2* 25.2*  MCV 88.0  --  89.0 89.0  PLT 231  --  193 209   Cardiac Enzymes:  Recent Labs Lab 08/12/13 1725 08/12/13 2250  TROPONINI <0.30 <0.30   BNP (last 3 results)  Recent Labs  02/09/13 1230 02/13/13 1145 02/14/13 0540  PROBNP 2537.0* 2031.0* 1703.0*   CBG:  Recent Labs Lab 08/15/13 0537 08/15/13 1114 08/15/13 1616 08/15/13 2046 08/16/13 0555  GLUCAP 83 296* 127* 117* 220*    No results found for this or any previous visit (from the past 240 hour(s)).   Studies: No results found.  Scheduled Meds: . amLODipine  2.5 mg Oral Daily  . aspirin EC  81 mg Oral Daily  . atorvastatin  20 mg Oral q1800  . carvedilol  12.5 mg Oral BID WC  . influenza vac split quadrivalent PF  0.5 mL Intramuscular Tomorrow-1000  . insulin aspart  0-5 Units Subcutaneous QHS  . insulin aspart  0-9 Units  Subcutaneous TID WC  . insulin aspart  3 Units Subcutaneous TID WC  . insulin glargine  20 Units Subcutaneous QHS  . isosorbide-hydrALAZINE  1 tablet Oral TID  . levothyroxine  75 mcg Oral QAC breakfast  . nitroGLYCERIN  0.5 inch Topical Q8H  . pantoprazole  40 mg Oral Daily  . sodium chloride  3 mL Intravenous Q12H  . traMADol  100 mg Oral BID   Continuous Infusions:    Renae Fickle  [facility] Pager [phone]. If 7PM-7AM, please contact night-coverage at [email or web address], password [facility] 08/16/2013, 9:38 AM  LOS: 4 days

## 2013-08-17 DIAGNOSIS — I10 Essential (primary) hypertension: Secondary | ICD-10-CM

## 2013-08-17 LAB — COMPREHENSIVE METABOLIC PANEL
ALT: 10 U/L (ref 0–35)
AST: 15 U/L (ref 0–37)
Albumin: 2.1 g/dL — ABNORMAL LOW (ref 3.5–5.2)
Alkaline Phosphatase: 100 U/L (ref 39–117)
BUN: 47 mg/dL — ABNORMAL HIGH (ref 6–23)
CO2: 21 mEq/L (ref 19–32)
Calcium: 8.3 mg/dL — ABNORMAL LOW (ref 8.4–10.5)
Chloride: 102 mEq/L (ref 96–112)
Creatinine, Ser: 2 mg/dL — ABNORMAL HIGH (ref 0.50–1.10)
GFR calc Af Amer: 27 mL/min — ABNORMAL LOW (ref >90)
GFR calc non Af Amer: 23 mL/min — ABNORMAL LOW (ref >90)
Glucose, Bld: 129 mg/dL — ABNORMAL HIGH (ref 70–99)
Potassium: 4.9 mEq/L (ref 3.5–5.1)
Sodium: 133 mEq/L — ABNORMAL LOW (ref 135–145)
Total Bilirubin: 0.3 mg/dL (ref 0.3–1.2)
Total Protein: 5.1 g/dL — ABNORMAL LOW (ref 6.0–8.3)

## 2013-08-17 LAB — GLUCOSE, CAPILLARY
Glucose-Capillary: 124 mg/dL — ABNORMAL HIGH (ref 70–99)
Glucose-Capillary: 124 mg/dL — ABNORMAL HIGH (ref 70–99)
Glucose-Capillary: 111 mg/dL — ABNORMAL HIGH (ref 70–99)
Glucose-Capillary: 310 mg/dL — ABNORMAL HIGH (ref 70–99)

## 2013-08-17 MED ORDER — FUROSEMIDE 10 MG/ML IJ SOLN
40.0000 mg | Freq: Two times a day (BID) | INTRAMUSCULAR | Status: DC
  Administered 2013-08-17 – 2013-08-18 (×2): 40 mg via INTRAVENOUS
  Filled 2013-08-17 (×4): qty 4

## 2013-08-17 NOTE — Progress Notes (Signed)
Cosigned for Nicole [name]'s assessment, I/O, med administration, care plan/education. D. Morry Veiga RN 

## 2013-08-17 NOTE — Progress Notes (Signed)
Patient Name: Nicole [name] Date of Encounter: 08/17/2013     Principal Problem:   Acute diastolic heart failure Active Problems:   ANEMIA OF CHRONIC DISEASE   HYPERTENSION   Chronic diastolic heart failure   PAD (peripheral artery disease)   Type 2 diabetes mellitus   Chronic kidney disease, stage III (moderate)   AKI (acute kidney injury)   Peripheral arterial disease    SUBJECTIVE  The patient had a tough night and feels SOB. She states that it started after initiation of iv fluids the last evening.  CURRENT MEDS . amLODipine  5 mg Oral Daily  . aspirin EC  81 mg Oral Daily  . atorvastatin  20 mg Oral q1800  . carvedilol  12.5 mg Oral BID WC  . insulin aspart  0-9 Units Subcutaneous TID WC  . insulin glargine  20 Units Subcutaneous QHS  . isosorbide-hydrALAZINE  1 tablet Oral TID  . levothyroxine  75 mcg Oral QAC breakfast  . nitroGLYCERIN  0.5 inch Topical Q8H  . pantoprazole  40 mg Oral Daily  . traMADol  100 mg Oral BID    OBJECTIVE  Filed Vitals:   08/16/13 2011 08/17/13 0056 08/17/13 0606 08/17/13 0940  BP: 178/41 186/41 158/36 162/51  Pulse: 71  62   Temp: 98.3 F (36.8 C)  98.4 F (36.9 C)   TempSrc: Oral  Oral   Resp: 18  18   Height:      Weight:   138 lb 0.2 oz (62.601 kg)   SpO2: 96%  98%     Intake/Output Summary (Last 24 hours) at 08/17/13 1358 Last data filed at 08/17/13 1300  Gross per 24 hour  Intake    480 ml  Output   1400 ml  Net   -920 ml   Filed Weights   08/15/13 0529 08/16/13 0530 08/17/13 0606  Weight: 134 lb 11.2 oz (61.1 kg) 135 lb 12.8 oz (61.598 kg) 138 lb 0.2 oz (62.601 kg)    PHYSICAL EXAM  General: Pleasant, NAD.  Neuro: Alert and oriented X 3. Moves all extremities spontaneously.  Psych: Normal affect.  HEENT: Normal  Neck: Supple without bruits or JVD.  Lungs: Resp regular and unlabored, CTA.  Heart: RRR no s3, s4, or murmurs.  Abdomen: Soft, non-tender, non-distended, BS + x 4.  Extremities: No  clubbing, cyanosis, mild pitting edema L>R, tenderness of the left calf, Radials 2+ and equal bilaterally, DP/PT weak, extremities warm   Accessory Clinical Findings  CBC  Recent Labs  08/16/13 1335  WBC 6.6  HGB 8.6*  HCT 25.4*  MCV 89.4  PLT 243   Basic Metabolic Panel  Recent Labs  08/16/13 0430 08/17/13 0707  NA 133* 133*  K 4.9 4.9  CL 101 102  CO2 23 21  GLUCOSE 254* 129*  BUN 49* 47*  CREATININE 2.37* 2.00*  CALCIUM 8.2* 8.3*   Liver Function Tests  Recent Labs  08/16/13 0430 08/17/13 0707  AST 15 15  ALT 9 10  ALKPHOS 98 100  BILITOT 0.2* 0.3  PROT 5.1* 5.1*  ALBUMIN 2.1* 2.1*   TTE:  Study Conclusions  - Left ventricle: The cavity size was normal. Systolic function was vigorous. The estimated ejection fraction was in the range of 65% to 70%. Wall motion was normal; there were no regional wall motion abnormalities. Features are consistent with a pseudonormal left ventricular filling pattern, with concomitant abnormal relaxation and increased filling pressure (grade 2 diastolic dysfunction). - Mitral  valve: Calcified annulus. Mild regurgitation. - Left atrium: The atrium was mildly dilated. - Right atrium: The atrium was mildly dilated. Impressions:  - Compared to the prior study, there has been no significant interval change.  Arterial Duplex Lower Extremities:  Summary: Findings consistent with >50% stenosis of the right common femoral artery into the right profunda femoral and femoral arteries. There is somecollateral flow noted.  Bilateral ABIs are suggestive of moderate arterial insufficiency.   TELE  SR, HR 60'  Radiology/Studies  Dg Chest 2 View  08/12/2013   *RADIOLOGY REPORT*  Clinical Data: Short of breath  CHEST - 2 VIEW  Comparison: 02/12/2013  Findings: Normal cardiac silhouette.  There is fine interstitial pattern with a linear markings laterally.  Breast tissue node on the left and right.  There is blunting of the  costophrenic angles slightly improved compared to prior.  IMPRESSION: Small bilateral pleural effusions and interstitial edema.   Original Report Authenticated By: Genevive Bi, M.D.   US Renal  08/16/2013   *RADIOLOGY REPORT*  Clinical Data: Acute kidney injury.  History of hypertension and diabetes.  RENAL/URINARY TRACT ULTRASOUND COMPLETE  Comparison:  Ultrasound 05/23/2008  Findings:  Right Kidney:  9.2 cm in length.  The lower pole is poorly visualized.  No focal mass or hydronephrosis identified.  Left Kidney:  10.1 cm in length.  Mildly echogenic renal parenchyma.  No focal mass or hydronephrosis identified.  Bladder:  Not visualized.  The patient voided just prior to exam.  Additional findings:  Note is made of bilateral pleural effusions.  IMPRESSION:  1.  No evidence for hydronephrosis. 2.  Decompressed bladder is not evaluated. 3.  Small bilateral pleural effusions.   Original Report Authenticated By: Norva Pavlov, M.D.    ASSESSMENT AND PLAN   Ms Badon is a 78 year old female with multiple medical problems including CKD, CAD and HFpEF admitted for acute on chronic heart failure and chest pain with ECG changes in the settings of hypertensive urgency.   1. Acute on chronic diastolic heart failure--  - Crea still high,  We will order renal artery ultrasound as there is a high suspicion for a stenosis - 2 gram sodium diet   2. Resistant hypertension on multiple antihypertensives - we will check renal artery [place] today. Increase Norvasc to 5 mg daily  3. CHF - still difficult to control as we are limited by her poor HTN control and diuretics use as her Crea increased significantly.  However it is not acceptable to give iv fluids, that overloaded her the last night, we will stop and give a small dose of Lasix  4. CAD - chest pain, negative Troponin, now pain free  Her most recent anatomic evaluation of the coronaries was 2012. She did have a heavily calcified circumflex system. I    Given her renal failure and advanced age, I would recommend that medical therapy be our initial strategy. Once her BP is adequately controlled, outpatient functional testing may be reasonable. Would reserve cath until medical options have been exhausted.  - continue apirin and high dose statin   5. . Peripheral arterial disease in lower extremities extremities and >50% stenosis of the right common femoral artery into the right profunda femoral and femoral arteries. There is somecollateral flow noted. Unfortunately, because of her AKI she is not a good candidate for an intervention right now. We will refer her to the interventionalists once her kidney function improves.    Virgel Manifold, H MD 08/17/2013

## 2013-08-17 NOTE — Progress Notes (Signed)
Patient has returned to unit from vascular lab; will continue to monitor. Lorretta Harp RN

## 2013-08-17 NOTE — Progress Notes (Signed)
Physical Therapy Treatment Patient Details Name: Nicole [name] MRN: [id number] DOB: 29-Nov-1935 Today's Date: 08/17/2013 Time: 4098-1191 PT Time Calculation (min): 23 min  PT Assessment / Plan / Recommendation  History of Present Illness 78 yo female h/o dm, htn, diastolic chf comes in with one week of worsening wt gain, and sob.  Today she started having sscp no radiation which was relieved after taking 2 ntg at home and asa.  She is normally on lasix 80 mg daily.  She increased her home dose of lasix several days ago due to increasing sob, and lower ext edema.  Since increasing her home lasix she has lost 7 lbs and her sob has markedly improved and her le edema has resolved.  However today when she started with cp she got concerned and came to ED.  She is cp free now.  No fevers.  No cough.  She has also had mildly increased worsening of her renal funcition.  uop is normal   PT Comments   Pt ambulated ~200' with straight cane today.  She states she feels as though she's moving at baseline.  States LLE becomes fatigued & weak with increased distance/activity but this is normal for her since she broke her hip 5 yrs ago.  Encouraged pt to use RW for increased distances to ensure safety.  Pt agreeable.     Follow Up Recommendations  No PT follow up;Supervision - Intermittent     Does the patient have the potential to tolerate intense rehabilitation     Barriers to Discharge        Equipment Recommendations       Recommendations for Other Services    Frequency Min 3X/week   Progress towards PT Goals Progress towards PT goals: Progressing toward goals  Plan Current plan remains appropriate    Precautions / Restrictions Precautions Precautions: Fall Restrictions Weight Bearing Restrictions: No   Pertinent Vitals/Pain No pain indicated.      Mobility  Bed Mobility Bed Mobility: Not assessed Details for Bed Mobility Assistance: Pt sitting on EOB upon arrival Transfers Transfers:  Sit to Stand;Stand to Sit Sit to Stand: 5: Supervision;With upper extremity assist;From bed Stand to Sit: 5: Supervision;With upper extremity assist;With armrests;To chair/3-in-1 Details for Transfer Assistance: supervision for safety Ambulation/Gait Ambulation/Gait Assistance: 4: Min guard Ambulation Distance (Feet): 200 Feet Assistive device: Straight cane Ambulation/Gait Assistance Details: Pt ambulated with straight cane.  No LOB noted.  Pt c/o Lt LE fatigue/weakness as distance increases.  Encouraged pt to use RW for longer distances for energy conservation & to ensure safety.   Gait Pattern: Step-through pattern;Decreased stride length General Gait Details: Pt states LLE is shorter than RLE & that LLE becomes fatigued/weak with increased distance.  Stairs: No     PT Goals (current goals can now be found in the care plan section) Acute Rehab PT Goals Patient Stated Goal: to return home PT Goal Formulation: With patient Time For Goal Achievement: 08/21/13 Potential to Achieve Goals: Good  Visit Information  Last PT Received On: 08/17/13 Assistance Needed: +1 History of Present Illness: 78 yo female h/o dm, htn, diastolic chf comes in with one week of worsening wt gain, and sob.  Today she started having sscp no radiation which was relieved after taking 2 ntg at home and asa.  She is normally on lasix 80 mg daily.  She increased her home dose of lasix several days ago due to increasing sob, and lower ext edema.  Since increasing her home lasix  she has lost 7 lbs and her sob has markedly improved and her le edema has resolved.  However today when she started with cp she got concerned and came to ED.  She is cp free now.  No fevers.  No cough.  She has also had mildly increased worsening of her renal funcition.  uop is normal    Subjective Data  Patient Stated Goal: to return home   Cognition  Cognition Arousal/Alertness: Awake/alert Behavior During Therapy: WFL for tasks  assessed/performed Overall Cognitive Status: Within Functional Limits for tasks assessed    Balance     End of Session PT - End of Session Equipment Utilized During Treatment: Gait belt Activity Tolerance: Patient tolerated treatment well Patient left: in chair;with call bell/phone within reach Nurse Communication: Mobility status   GP     Lara Mulch 08/17/2013, 1:06 PM   Verdell Face, PTA [phone] 08/17/2013

## 2013-08-17 NOTE — Progress Notes (Signed)
*  PRELIMINARY RESULTS* Vascular Ultrasound Renal Artery Duplex has been completed.  Preliminary findings: Normal renal artery velocities. Atypical waveforms and abnormal resistive indices suggestive of distal/ intrarenal occlusion bilaterally.  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   Farrel Demark, RDMS, RVT  08/17/2013, 4:27 PM

## 2013-08-17 NOTE — Progress Notes (Signed)
[facility] PROGRESS NOTE  Assessment/Plan:  Acute diastolic heart failure/Chronic diastolic heart failure, Dry weight is 57. Kg.  Currently 62.6kg. -  Agree with gentle diuresis as tolerated.   -  Cont. Coreg and bidil -  Avoid ACEI in setting of AKI  Acute on Chronic kidney disease III/IV:  Baseline Cr 1.3-1.5.  Mild improvement in creatinine with fluid challenge, but patient quickly became SOB and no change in BUN.   -  Renal US:  No obstruction -  Renal artery duplex:  Abnormal waveforms suggestive of distal or intrarenal obstruction.  Follow up official report.    -  FENa:  Prerenal (could suggest cardiorenal syndrome or RAS) -  Renally dose medications and minimize nephrotoxins  HYPERTENSION, BP elevated - Continue bidil TID, carvedilol 12.5mg  BID -  Norvasc increased to 5mg  daily  PAD (peripheral artery disease):  > 50% stenosis/moderate PAD on [place].   - ASA, axs. - Interventional cardiology once renal function improves  Diarrhea overnight with abdominal cramping, resolved.   -  D/c C. Diff and enteric precautions  ANEMIA OF CHRONIC DISEASE/GFR < 50 (renal parenchymal disease) - Repeat CBC with iron studies, TSH, B12, folate in AM  Type 2 diabetes mellitus:  Glucose better controlled today - Continue current Lantus and SSI.   Hyponatremia due to CHF.   -  Continue diuresis.  Code Status: full Family Communication: none  Disposition Plan: inpatient   Consultants:  cardiology  Procedures:  echo  Antibiotics:  None  HPI/Subjective: Felt immediately SOB after starting fluids last night.  Rate decreased by cardiology.  Did not sleep well and feeling fatigued today.    Objective: Filed Vitals:   08/17/13 0056 08/17/13 0606 08/17/13 0940 08/17/13 1500  BP: 186/41 158/36 162/51 150/60  Pulse:  62  66  Temp:  98.4 F (36.9 C)  98.1 F (36.7 C)  TempSrc:  Oral  Oral  Resp:  18  18  Height:      Weight:  62.601 kg (138 lb 0.2 oz)    SpO2:  98%  98%     Intake/Output Summary (Last 24 hours) at 08/17/13 1729 Last data filed at 08/17/13 1650  Gross per 24 hour  Intake    240 ml  Output   1550 ml  Net  -1310 ml   Filed Weights   08/15/13 0529 08/16/13 0530 08/17/13 0606  Weight: 61.1 kg (134 lb 11.2 oz) 61.598 kg (135 lb 12.8 oz) 62.601 kg (138 lb 0.2 oz)    Exam:  General: Alert, awake, oriented x3, in no acute distress, sitting up in chair  HEENT: No bruits, no goiter. +JVD to preauricular area while sitting upright Heart: Regular rate and rhythm, without murmurs, rubs, + gallop Lungs:  Diminished and with rales at the bilateral bases, no wheezes or rhonchi, no increased WOB Abdomen: Soft, nontender, nondistended, positive bowel sounds.  MSK:  Bilateral lower extremity slow pitting woody edema.     Data Reviewed: Basic Metabolic Panel:  Recent Labs Lab 08/13/13 0535 08/14/13 0648 08/15/13 0500 08/16/13 0430 08/17/13 0707  NA 141 137 137 133* 133*  K 3.0* 4.3 4.3 4.9 4.9  CL 106 105 104 101 102  CO2 23 23 25 23 21   GLUCOSE 80 112* 90 254* 129*  BUN 45* 42* 45* 49* 47*  CREATININE 1.86* 2.03* 2.30* 2.37* 2.00*  CALCIUM 8.5 8.4 8.3* 8.2* 8.3*   Liver Function Tests:  Recent Labs Lab 08/16/13 0430 08/17/13 0707  AST 15 15  ALT 9 10  ALKPHOS 98 100  BILITOT 0.2* 0.3  PROT 5.1* 5.1*  ALBUMIN 2.1* 2.1*   No results found for this basename: LIPASE, AMYLASE,  in the last 168 hours No results found for this basename: AMMONIA,  in the last 168 hours CBC:  Recent Labs Lab 08/12/13 1724 08/12/13 1803 08/13/13 0535 08/14/13 0648 08/16/13 1335  WBC 7.4  --  5.2 5.2 6.6  HGB 9.2* 9.5* 8.6* 8.4* 8.6*  HCT 26.5* 28.0* 25.2* 25.2* 25.4*  MCV 88.0  --  89.0 89.0 89.4  PLT 231  --  193 209 243   Cardiac Enzymes:  Recent Labs Lab 08/12/13 1725 08/12/13 2250  TROPONINI <0.30 <0.30   BNP (last 3 results)  Recent Labs  02/09/13 1230 02/13/13 1145 02/14/13 0540  PROBNP 2537.0* 2031.0* 1703.0*    CBG:  Recent Labs Lab 08/16/13 2139 08/17/13 0410 08/17/13 0608 08/17/13 1111 08/17/13 1706  GLUCAP 212* 124* 124* 207* 111*    No results found for this or any previous visit (from the past 240 hour(s)).   Studies: US Renal  08/16/2013   *RADIOLOGY REPORT*  Clinical Data: Acute kidney injury.  History of hypertension and diabetes.  RENAL/URINARY TRACT ULTRASOUND COMPLETE  Comparison:  Ultrasound 05/23/2008  Findings:  Right Kidney:  9.2 cm in length.  The lower pole is poorly visualized.  No focal mass or hydronephrosis identified.  Left Kidney:  10.1 cm in length.  Mildly echogenic renal parenchyma.  No focal mass or hydronephrosis identified.  Bladder:  Not visualized.  The patient voided just prior to exam.  Additional findings:  Note is made of bilateral pleural effusions.  IMPRESSION:  1.  No evidence for hydronephrosis. 2.  Decompressed bladder is not evaluated. 3.  Small bilateral pleural effusions.   Original Report Authenticated By: Norva Pavlov, M.D.    Scheduled Meds: . amLODipine  5 mg Oral Daily  . aspirin EC  81 mg Oral Daily  . atorvastatin  20 mg Oral q1800  . carvedilol  12.5 mg Oral BID WC  . furosemide  40 mg Intravenous BID  . insulin aspart  0-9 Units Subcutaneous TID WC  . insulin glargine  20 Units Subcutaneous QHS  . isosorbide-hydrALAZINE  1 tablet Oral TID  . levothyroxine  75 mcg Oral QAC breakfast  . nitroGLYCERIN  0.5 inch Topical Q8H  . pantoprazole  40 mg Oral Daily  . traMADol  100 mg Oral BID   Continuous Infusions:    Renae Fickle  [facility] Pager [phone]. If 7PM-7AM, please contact night-coverage at [email or web address], password [facility] 08/17/2013, 5:29 PM  LOS: 5 days

## 2013-08-18 LAB — GLUCOSE, CAPILLARY
Glucose-Capillary: 271 mg/dL — ABNORMAL HIGH (ref 70–99)
Glucose-Capillary: 166 mg/dL — ABNORMAL HIGH (ref 70–99)

## 2013-08-18 LAB — COMPREHENSIVE METABOLIC PANEL
ALT: 11 U/L (ref 0–35)
AST: 14 U/L (ref 0–37)
Albumin: 2.1 g/dL — ABNORMAL LOW (ref 3.5–5.2)
Alkaline Phosphatase: 104 U/L (ref 39–117)
BUN: 49 mg/dL — ABNORMAL HIGH (ref 6–23)
CO2: 22 mEq/L (ref 19–32)
Calcium: 8.5 mg/dL (ref 8.4–10.5)
Chloride: 104 mEq/L (ref 96–112)
Creatinine, Ser: 2.01 mg/dL — ABNORMAL HIGH (ref 0.50–1.10)
GFR calc Af Amer: 27 mL/min — ABNORMAL LOW (ref >90)
GFR calc non Af Amer: 23 mL/min — ABNORMAL LOW (ref >90)
Glucose, Bld: 203 mg/dL — ABNORMAL HIGH (ref 70–99)
Potassium: 4.5 mEq/L (ref 3.5–5.1)
Sodium: 136 mEq/L (ref 135–145)
Total Bilirubin: 0.3 mg/dL (ref 0.3–1.2)
Total Protein: 5.2 g/dL — ABNORMAL LOW (ref 6.0–8.3)

## 2013-08-18 LAB — TSH: TSH: 10.001 u[IU]/mL — ABNORMAL HIGH (ref 0.350–4.500)

## 2013-08-18 LAB — IRON AND TIBC
Iron: 29 ug/dL — ABNORMAL LOW (ref 42–135)
Saturation Ratios: 10 % — ABNORMAL LOW (ref 20–55)

## 2013-08-18 LAB — VITAMIN B12: Vitamin B-12: 402 pg/mL (ref 211–911)

## 2013-08-18 MED ORDER — FUROSEMIDE 10 MG/ML IJ SOLN
80.0000 mg | Freq: Once | INTRAMUSCULAR | Status: AC
  Administered 2013-08-18: 80 mg via INTRAVENOUS
  Filled 2013-08-18: qty 8

## 2013-08-18 MED ORDER — SODIUM CHLORIDE 0.9 % IV SOLN
1020.0000 mg | Freq: Once | INTRAVENOUS | Status: AC
  Administered 2013-08-18: 1020 mg via INTRAVENOUS
  Filled 2013-08-18: qty 34

## 2013-08-18 MED ORDER — NITROGLYCERIN IN D5W 200-5 MCG/ML-% IV SOLN
5.0000 ug/min | INTRAVENOUS | Status: DC
  Administered 2013-08-18: 21:00:00 5 ug/min via INTRAVENOUS
  Filled 2013-08-18 (×2): qty 250

## 2013-08-18 MED ORDER — FUROSEMIDE 10 MG/ML IJ SOLN
40.0000 mg | Freq: Once | INTRAMUSCULAR | Status: AC
  Administered 2013-08-18: 40 mg via INTRAVENOUS

## 2013-08-18 MED ORDER — FUROSEMIDE 10 MG/ML IJ SOLN
80.0000 mg | Freq: Two times a day (BID) | INTRAMUSCULAR | Status: DC
  Administered 2013-08-19 – 2013-08-20 (×3): 80 mg via INTRAVENOUS
  Filled 2013-08-18 (×5): qty 8

## 2013-08-18 MED ORDER — FUROSEMIDE 10 MG/ML IJ SOLN: 80.0000 mg | Freq: Two times a day (BID) | INTRAMUSCULAR | Status: DC

## 2013-08-18 MED ORDER — AMLODIPINE BESYLATE 10 MG PO TABS
10.0000 mg | ORAL_TABLET | Freq: Every day | ORAL | Status: DC
  Administered 2013-08-19 – 2013-08-22 (×4): 10 mg via ORAL
  Filled 2013-08-18 (×4): qty 1

## 2013-08-18 NOTE — Progress Notes (Signed)
Pt assessment completed, pt denies any pain, c/o SOB and fatigue, BP 190/39, no distress noticed. Nitro gtt started at 20:38 as ordered today at 11:30 am. Pt resting on bed no family present at the bedside. Pt to be transfer to 2H16 per MD order.

## 2013-08-18 NOTE — Progress Notes (Addendum)
Patient Name: Nicole [name] Date of Encounter: 08/18/2013     Principal Problem:   Acute diastolic heart failure Active Problems:   ANEMIA OF CHRONIC DISEASE   HYPERTENSION   Chronic diastolic heart failure   PAD (peripheral artery disease)   Type 2 diabetes mellitus   Chronic kidney disease, stage III (moderate)   AKI (acute kidney injury)   Peripheral arterial disease    SUBJECTIVE  The patient complains of difficulty breathing and inability to sleep  CURRENT MEDS . [START ON 08/19/2013] amLODipine  10 mg Oral Daily  . aspirin EC  81 mg Oral Daily  . atorvastatin  20 mg Oral q1800  . carvedilol  12.5 mg Oral BID WC  . insulin aspart  0-9 Units Subcutaneous TID WC  . insulin glargine  20 Units Subcutaneous QHS  . isosorbide-hydrALAZINE  1 tablet Oral TID  . levothyroxine  75 mcg Oral QAC breakfast  . pantoprazole  40 mg Oral Daily  . traMADol  100 mg Oral BID    OBJECTIVE  Filed Vitals:   08/18/13 0100 08/18/13 0438 08/18/13 0841 08/18/13 1013  BP: 152/58 152/38 155/31 154/43  Pulse: 62 57 63   Temp:  98.4 F (36.9 C) 97.9 F (36.6 C)   TempSrc:  Oral    Resp:  18 20   Height:      Weight:  136 lb 4.8 oz (61.825 kg)    SpO2:  93% 94%     Intake/Output Summary (Last 24 hours) at 08/18/13 1252 Last data filed at 08/18/13 1030  Gross per 24 hour  Intake    420 ml  Output   1650 ml  Net  -1230 ml   Filed Weights   08/16/13 0530 08/17/13 0606 08/18/13 0438  Weight: 135 lb 12.8 oz (61.598 kg) 138 lb 0.2 oz (62.601 kg) 136 lb 4.8 oz (61.825 kg)    PHYSICAL EXAM  General: Pleasant, NAD. Neuro: Alert and oriented X 3. Moves all extremities spontaneously. Psych: Normal affect. HEENT:  Normal  Neck: Supple without bruit, JVD + 8 cm. Lungs:  Resp regular and unlabored, crackles B/L. Heart: RRR no s3, s4, or murmurs. Abdomen: Soft, non-tender, non-distended, BS + x 4.  Extremities: No clubbing, cyanosis, mild pitting edema. DP/PT weak  B/L  Accessory Clinical Findings  CBC  Recent Labs  08/16/13 1335  WBC 6.6  HGB 8.6*  HCT 25.4*  MCV 89.4  PLT 243   Basic Metabolic Panel  Recent Labs  08/17/13 0707 08/18/13 0620  NA 133* 136  K 4.9 4.5  CL 102 104  CO2 21 22  GLUCOSE 129* 203*  BUN 47* 49*  CREATININE 2.00* 2.01*  CALCIUM 8.3* 8.5   Liver Function Tests  Recent Labs  08/17/13 0707 08/18/13 0620  AST 15 14  ALT 10 11  ALKPHOS 100 104  BILITOT 0.3 0.3  PROT 5.1* 5.2*  ALBUMIN 2.1* 2.1*   No results found for this basename: LIPASE, AMYLASE,  in the last 72 hours Cardiac Enzymes No results found for this basename: CKTOTAL, CKMB, CKMBINDEX, TROPONINI,  in the last 72 hours BNP No components found with this basename: POCBNP,  D-Dimer No results found for this basename: DDIMER,  in the last 72 hours Hemoglobin A1C No results found for this basename: HGBA1C,  in the last 72 hours Fasting Lipid Panel No results found for this basename: CHOL, HDL, LDLCALC, TRIG, CHOLHDL, LDLDIRECT,  in the last 72 hours Thyroid Function Tests  Recent  Labs  08/18/13 0620  TSH 10.001*    TELE  SR, HR 60'  Dg Chest 2 View  08/12/2013   *RADIOLOGY REPORT*  Clinical Data: Short of breath  CHEST - 2 VIEW  Comparison: 02/12/2013  Findings: Normal cardiac silhouette.  There is fine interstitial pattern with a linear markings laterally.  Breast tissue node on the left and right.  There is blunting of the costophrenic angles slightly improved compared to prior.  IMPRESSION: Small bilateral pleural effusions and interstitial edema.   Original Report Authenticated By: Genevive Bi, M.D.   US Renal  08/16/2013   *RADIOLOGY REPORT*  Clinical Data: Acute kidney injury.  History of hypertension and diabetes.  RENAL/URINARY TRACT ULTRASOUND COMPLETE  Comparison:  Ultrasound 05/23/2008  Findings:  Right Kidney:  9.2 cm in length.  The lower pole is poorly visualized.  No focal mass or hydronephrosis identified.  Left  Kidney:  10.1 cm in length.  Mildly echogenic renal parenchyma.  No focal mass or hydronephrosis identified.  Bladder:  Not visualized.  The patient voided just prior to exam.  Additional findings:  Note is made of bilateral pleural effusions.  IMPRESSION:  1.  No evidence for hydronephrosis. 2.  Decompressed bladder is not evaluated. 3.  Small bilateral pleural effusions.   Original Report Authenticated By: Norva Pavlov, M.D.   Arterial Duplex:  Summary: Findings consistent with >50% stenosis of the right common femoral artery into the right profunda femoral and femoral arteries. There is somecollateral flow noted.  Bilateral ABIs are suggestive of moderate arterial insufficiency.  Renal artery ultrasound Renal Artery Duplex has been completed. Preliminary findings: Normal renal artery velocities. Atypical waveforms and abnormal resistive indices suggestive of distal/ intrarenal occlusion bilaterally.  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   ASSESSMENT AND PLAN  Nicole [name] is a 78 year old female with multiple medical problems including CKD, CAD and HFpEF admitted for acute on chronic heart failure and chest pain with ECG changes in the settings of hypertensive urgency.   1. Acute on chronic diastolic heart failure - in the settings of hypertensive urgency. Renal artery [place] is suggestive of distal/ intrarenal occlusion bilaterally. We will continue aggressive medical therapy. The patient is severely fluid overloaded, we will increase her Lasix iv today and carefully watch her Crea  2. Resistant hypertension on multiple antihypertensives - we will start NTG drip plus continue all the PO meds - 2g Na diet  3. Acute on chronic kidney failure - Crea is still high, considering her fluid overload we need to use diuretics. Nephrology consult was called.   4. CAD - chest pain, negative Troponin, now pain free  Her most recent anatomic evaluation of the  coronaries was 2012. She did have a heavily calcified circumflex system. I  Given her renal failure and advanced age, I would recommend that medical therapy be our initial strategy. Once her BP is adequately controlled, outpatient functional testing may be reasonable. Would reserve cath until medical options have been exhausted.  - continue apirin and high dose statin   5. . Peripheral arterial disease in lower extremities extremities and >50% stenosis of the right common femoral artery into the right profunda femoral and femoral arteries. There is somecollateral flow noted. Unfortunately, because of her AKI she is not a good candidate for an intervention right now. We will refer her to the interventionalists once her kidney function improves.  Virgel Manifold, H MD 08/18/2013   This  is an addendum to an earlier note:  Because of progressively worsening volume overload, worsening symptoms of SOB and challenging diuresis with worsening kidney function we will transfer the patient to unit 2H. We will place a central line and start her on a low dose Dopamine in order to facilitate diuresis. The central line will also allow [place] to measure CVP. In case this won't help we will consider ultrafiltration.  Meanwhile we will continue with aggressive hypertension control. Renal artery [place] is suggestive of distal/ intrarenal occlusion bilaterally, Dr. Gala Romney suggested CO2 arteriography to assess the location occlusion and possible intervention.   The case was discussed with Dr. Gala Romney.

## 2013-08-18 NOTE — Consult Note (Signed)
Reason for Consult: Acute on chronic renal failure Referring Physician: Dr. Scherrie Merritts is an 78 y.o. female.  HPI: [name] is a 78 year old woman with PMH of DM2, HTN, diastolic CHF, CAD with several cardiac caths (most recent in June 2012), PVD, CKD stage 3 (baseline creatinine of 1.4 in 5/14), who was admitted to the [facility] on 9/6 with one week of worsening weight gain and shortness of breath and chest pain. She was found to be volume overloaded and started on aggressive IV diuresis. Her chest pain resolved with ACS ruled out with negative troponin and no EKG changes. She developed acute on chronic renal failure with creatinine increase to 2.3 from 2.0 on admission and agressive diuresis was stopped. A renal US renal ultrasound revealed no obstruction and no hydronephrosis. The IV diuretic was restarted with her urine output above the anuric levels but her creatinine remains elevated to 2.01. She also has had difficult to control hypertension despite treatment with multiple antihypertensives. A renal Artery Duplex on 9/11 was remarkable for normal renal artery velocities but abnormal resistive indices suggestive of distal/intrarenal occlusion bilaterally.     Past Medical History  Diagnosis Date  . Retinopathy   . Hypertension   . Chronic diastolic CHF (congestive heart failure)     has a normal EF per echo 03/2011 & 5/2013with diastolic dysfunction  . CAD (coronary artery disease)     Prior PCI in 1998 x 2; s/p cath in 2001, negative Myoview in 2008 & 2011, s/p cath in June 2012 with calcified disease of the proximal LCX with normal flow reserve. She has been managed medically due to her other morbidities  . Cerebrovascular disease   . Peripheral vascular disease     prior angiography showing RSFA stenosis, left anterior tibial and left posterior tibial stenoses  . Hypercholesterolemia   . Diabetes mellitus     insulin dependent  . Hypothyroidism   . Hiatal hernia   .  Degenerative joint disease   . Osteoporosis   . Vitamin D deficiency   . Anemia of chronic disease   . Renal insufficiency   . Shortness of breath   . Anginal pain     Past Surgical History  Procedure Laterality Date  . Abdominal aortogram  07/27/2003    by Dr. Chales Abrahams  . Bilateral lower extremity angiogram  07/27/2003    by Dr. Chales Abrahams  . Selective angiography,right superficial femoral artery, right and left iliac arteries  07/27/2003    by Dr. Chales Abrahams  . Measurement of gradient right and left iliac arteries  07/27/2003    by Dr. Chales Abrahams  . Bilateral cataract sugery  2000 and 2002    by Dr. Cecilie Kicks  . Repair left hip fracture  04/2008    by Dr. Charlann Boxer    Family History  Problem Relation Age of Onset  . Heart disease Mother     Social History:  reports that she quit smoking about 38 years ago. Her smoking use included Cigarettes. She smoked 0.00 packs per day. She has never used smokeless tobacco. She reports that she does not drink alcohol or use illicit drugs.  Allergies:  Allergies  Allergen Reactions  . Ibuprofen     REACTION: edema---thyroid problems  . Prednisone     Causes severe fluid retention    Medications: I have reviewed the patient's current medications.   Results for orders placed during the hospital encounter of 08/12/13 (from the past 48 hour(s))  GLUCOSE,  CAPILLARY     Status: None   Collection Time    08/16/13 11:30 AM      Result Value Range   Glucose-Capillary 81  70 - 99 mg/dL  SODIUM, URINE, RANDOM     Status: None   Collection Time    08/16/13 11:40 AM      Result Value Range   Sodium, Ur 16    CREATININE, URINE, RANDOM     Status: None   Collection Time    08/16/13 11:40 AM      Result Value Range   Creatinine, Urine 59.22    GLUCOSE, CAPILLARY     Status: Abnormal   Collection Time    08/16/13 12:33 PM      Result Value Range   Glucose-Capillary 137 (*) 70 - 99 mg/dL  CBC     Status: Abnormal   Collection Time    08/16/13  1:35 PM       Result Value Range   WBC 6.6  4.0 - 10.5 K/uL   RBC 2.84 (*) 3.87 - 5.11 MIL/uL   Hemoglobin 8.6 (*) 12.0 - 15.0 g/dL   HCT 21.3 (*) 08.6 - 57.8 %   MCV 89.4  78.0 - 100.0 fL   MCH 30.3  26.0 - 34.0 pg   MCHC 33.9  30.0 - 36.0 g/dL   RDW 46.9  62.9 - 52.8 %   Platelets 243  150 - 400 K/uL  GLUCOSE, CAPILLARY     Status: Abnormal   Collection Time    08/16/13  4:25 PM      Result Value Range   Glucose-Capillary 326 (*) 70 - 99 mg/dL   Comment 1 Documented in Chart     Comment 2 Notify RN    GLUCOSE, CAPILLARY     Status: Abnormal   Collection Time    08/16/13  9:39 PM      Result Value Range   Glucose-Capillary 212 (*) 70 - 99 mg/dL  GLUCOSE, CAPILLARY     Status: Abnormal   Collection Time    08/17/13  4:10 AM      Result Value Range   Glucose-Capillary 124 (*) 70 - 99 mg/dL  GLUCOSE, CAPILLARY     Status: Abnormal   Collection Time    08/17/13  6:08 AM      Result Value Range   Glucose-Capillary 124 (*) 70 - 99 mg/dL  COMPREHENSIVE METABOLIC PANEL     Status: Abnormal   Collection Time    08/17/13  7:07 AM      Result Value Range   Sodium 133 (*) 135 - 145 mEq/L   Potassium 4.9  3.5 - 5.1 mEq/L   Chloride 102  96 - 112 mEq/L   CO2 21  19 - 32 mEq/L   Glucose, Bld 129 (*) 70 - 99 mg/dL   BUN 47 (*) 6 - 23 mg/dL   Creatinine, Ser 4.13 (*) 0.50 - 1.10 mg/dL   Calcium 8.3 (*) 8.4 - 10.5 mg/dL   Total Protein 5.1 (*) 6.0 - 8.3 g/dL   Albumin 2.1 (*) 3.5 - 5.2 g/dL   AST 15  0 - 37 U/L   ALT 10  0 - 35 U/L   Alkaline Phosphatase 100  39 - 117 U/L   Total Bilirubin 0.3  0.3 - 1.2 mg/dL   GFR calc non Af Amer 23 (*) >90 mL/min   GFR calc Af Amer 27 (*) >90 mL/min   Comment: (NOTE)  The eGFR has been calculated using the CKD EPI equation.     This calculation has not been validated in all clinical situations.     eGFR's persistently <90 mL/min signify possible Chronic Kidney     Disease.  GLUCOSE, CAPILLARY     Status: Abnormal   Collection Time    08/17/13 11:11  AM      Result Value Range   Glucose-Capillary 207 (*) 70 - 99 mg/dL   Comment 1 Notify RN    GLUCOSE, CAPILLARY     Status: Abnormal   Collection Time    08/17/13  5:06 PM      Result Value Range   Glucose-Capillary 111 (*) 70 - 99 mg/dL  GLUCOSE, CAPILLARY     Status: Abnormal   Collection Time    08/17/13  9:41 PM      Result Value Range   Glucose-Capillary 310 (*) 70 - 99 mg/dL   Comment 1 Notify RN     Comment 2 Documented in Chart    GLUCOSE, CAPILLARY     Status: Abnormal   Collection Time    08/18/13  3:32 AM      Result Value Range   Glucose-Capillary 271 (*) 70 - 99 mg/dL   Comment 1 Notify RN     Comment 2 Documented in Chart    COMPREHENSIVE METABOLIC PANEL     Status: Abnormal   Collection Time    08/18/13  6:20 AM      Result Value Range   Sodium 136  135 - 145 mEq/L   Potassium 4.5  3.5 - 5.1 mEq/L   Chloride 104  96 - 112 mEq/L   CO2 22  19 - 32 mEq/L   Glucose, Bld 203 (*) 70 - 99 mg/dL   BUN 49 (*) 6 - 23 mg/dL   Creatinine, Ser 4.69 (*) 0.50 - 1.10 mg/dL   Calcium 8.5  8.4 - 62.9 mg/dL   Total Protein 5.2 (*) 6.0 - 8.3 g/dL   Albumin 2.1 (*) 3.5 - 5.2 g/dL   AST 14  0 - 37 U/L   ALT 11  0 - 35 U/L   Alkaline Phosphatase 104  39 - 117 U/L   Total Bilirubin 0.3  0.3 - 1.2 mg/dL   GFR calc non Af Amer 23 (*) >90 mL/min   GFR calc Af Amer 27 (*) >90 mL/min   Comment: (NOTE)     The eGFR has been calculated using the CKD EPI equation.     This calculation has not been validated in all clinical situations.     eGFR's persistently <90 mL/min signify possible Chronic Kidney     Disease.  GLUCOSE, CAPILLARY     Status: Abnormal   Collection Time    08/18/13  6:27 AM      Result Value Range   Glucose-Capillary 194 (*) 70 - 99 mg/dL    US Renal  05/04/4131   *RADIOLOGY REPORT*  Clinical Data: Acute kidney injury.  History of hypertension and diabetes.  RENAL/URINARY TRACT ULTRASOUND COMPLETE  Comparison:  Ultrasound 05/23/2008  Findings:  Right Kidney:   9.2 cm in length.  The lower pole is poorly visualized.  No focal mass or hydronephrosis identified.  Left Kidney:  10.1 cm in length.  Mildly echogenic renal parenchyma.  No focal mass or hydronephrosis identified.  Bladder:  Not visualized.  The patient voided just prior to exam.  Additional findings:  Note is made of bilateral pleural effusions.  IMPRESSION:  1.  No evidence for hydronephrosis. 2.  Decompressed bladder is not evaluated. 3.  Small bilateral pleural effusions.   Original Report Authenticated By: Norva Pavlov, M.D.    ROS: shortness of breath, lower extremity edema, decreased appetite. Denies chest pain, confusion, nausea, vomiting, constipation, or diarrhea.   Blood pressure 154/43, pulse 63, temperature 97.9 F (36.6 C), temperature source Oral, resp. rate 20, height 5\' 4"  (1.626 m), weight 136 lb 4.8 oz (61.825 kg), SpO2 94.00%.  Gen: Sitting in chair, eating her lunch. Very pleasant Neck: JVD to jaw line while sitting upright.  Cardiac: regular rate and rhythm, no murmur or gallops.  Pulmonary: Bilateral crackles up to her mid lung fields bilaterally Abdominal: Soft, non tender, normoactive BS Extremities: 1+ pitting edema bilaterally up to her knees Neuro: Alert and Oriented x3, moves all 4 extremities voluntairly   Assessment/Plan: 1 Acute on chronic renal failure - Multifactorial with decreased renal profusion possibly from cardiorenal syndrome (likely type 1), aggressive diuresis, and initiation of antihypertensives. Her creatinine is stable at 2.0. She is not uremic, with normal/low urine output, without increased shortness of breath/work of breathing indicative of worsening pulmonary edema. HD not indicated at this time but this may change. Renal artery Duplex with distant stenosis consistent with chronic kidney disease with no indication for intervention/stenting.  -Would continue first-line therapy of Lasix for CRS, continue trending Creatinine, and continue holding  ACEi.     -Will order PTH  2 Hypertension: BP goal of <140/90 for her CKD and DM2.  -Agree with Norvasc, Coreg, and Bidil.  -Would avoid fast decrease in blood pressure to prevent further renal hypoperfusion.   3. Diastolic CHF: Mild hypervolemia with stable respiratory status, no indication for HD at this time.  -Agree with increased diuresis with Lasix IV.   4. Anemia: No report of bleeding. Baseline Hg at 10-11. Hg currently stable at 8.6. This could be secondary to renal disease.   -Will order iron and TIBC    Ky Barban IM PGY-2  [phone]  08/18/2013, 11:11 AM

## 2013-08-18 NOTE — Progress Notes (Addendum)
[facility] PROGRESS NOTE  Assessment/Plan:  Acute diastolic heart failure/Chronic diastolic heart failure, Dry weight is 57. Kg.  Discussed with Dr. Delton See, cardiology.  Minimal uop with lasix 40mg  IV.   -  Trial of lasix 80mg  IV, goal of negative 2L daily  -  Cont. Coreg and bidil -  D/c NTG paste and start NTG gtt per cardiology -  Avoid ACEI in setting of AKI  Acute on Chronic kidney disease III/IV:  Baseline Cr 1.3-1.5.  Mild improvement in creatinine with fluid challenge, but patient quickly became SOB and no change in BUN.   -  Renal US:  No obstruction -  Renal artery duplex:  Abnormal waveforms suggestive of distal or intrarenal obstruction.  Follow up official report.    -  FENa:  Prerenal (could suggest cardiorenal syndrome or RAS) -  Renally dose medications and minimize nephrotoxins -  Nephrology consultation  HYPERTENSION, SBP elevated, but diastolic low.  No AI.  Per cardiology, diastolic may be inaccurate due to PAD.   - Continue bidil TID, carvedilol 12.5mg  BID -  Norvasc increased to 10 mg daily  PAD (peripheral artery disease):  > 50% stenosis/moderate PAD on [place].   - ASA, axs. - Interventional cardiology once renal function improves  Diarrhea overnight with abdominal cramping, resolved.  ANEMIA OF CHRONIC DISEASE/GFR < 50 (renal parenchymal disease) - Repeat CBC with iron studies, TSH, B12, folate in AM  Type 2 diabetes mellitus:  Glucose better controlled today - Continue current Lantus and SSI.   Hyponatremia due to CHF.   -  Continue diuresis.  Code Status: full Family Communication: none  Disposition Plan: inpatient   Consultants:  cardiology  Procedures:  echo  Antibiotics:  None  HPI/Subjective: Still feels very unwell, SOB and dry cough.  Fatigued.  Eating some, denies constipation, diarrhea.  No voids since morning lasix.    Objective: Filed Vitals:   08/18/13 0100 08/18/13 0438 08/18/13 0841 08/18/13 1013  BP: 152/58  152/38 155/31 154/43  Pulse: 62 57 63   Temp:  98.4 F (36.9 C) 97.9 F (36.6 C)   TempSrc:  Oral    Resp:  18 20   Height:      Weight:  61.825 kg (136 lb 4.8 oz)    SpO2:  93% 94%     Intake/Output Summary (Last 24 hours) at 08/18/13 1048 Last data filed at 08/18/13 0917  Gross per 24 hour  Intake    420 ml  Output   1150 ml  Net   -730 ml   Filed Weights   08/16/13 0530 08/17/13 0606 08/18/13 0438  Weight: 61.598 kg (135 lb 12.8 oz) 62.601 kg (138 lb 0.2 oz) 61.825 kg (136 lb 4.8 oz)    Exam:  General: Alert, awake, oriented x3, in no acute distress, lying in bed, mild pallor HEENT: No bruits, no goiter. +JVD to preauricular area while sitting upright Heart: Regular rate and rhythm, 1/6 apical murmur, no rubs, + gallop Lungs:  Diminished and with rales at the bilateral bases right > left to the midback on the right, no wheezes or rhonchi, no increased WOB Abdomen: Soft, nontender, nondistended, positive bowel sounds.  MSK:  Bilateral lower extremity slow pitting woody edema.     Data Reviewed: Basic Metabolic Panel:  Recent Labs Lab 08/14/13 0648 08/15/13 0500 08/16/13 0430 08/17/13 0707 08/18/13 0620  NA 137 137 133* 133* 136  K 4.3 4.3 4.9 4.9 4.5  CL 105 104 101 102 104  CO2 23 25 23 21 22   GLUCOSE 112* 90 254* 129* 203*  BUN 42* 45* 49* 47* 49*  CREATININE 2.03* 2.30* 2.37* 2.00* 2.01*  CALCIUM 8.4 8.3* 8.2* 8.3* 8.5   Liver Function Tests:  Recent Labs Lab 08/16/13 0430 08/17/13 0707 08/18/13 0620  AST 15 15 14   ALT 9 10 11   ALKPHOS 98 100 104  BILITOT 0.2* 0.3 0.3  PROT 5.1* 5.1* 5.2*  ALBUMIN 2.1* 2.1* 2.1*   No results found for this basename: LIPASE, AMYLASE,  in the last 168 hours No results found for this basename: AMMONIA,  in the last 168 hours CBC:  Recent Labs Lab 08/12/13 1724 08/12/13 1803 08/13/13 0535 08/14/13 0648 08/16/13 1335  WBC 7.4  --  5.2 5.2 6.6  HGB 9.2* 9.5* 8.6* 8.4* 8.6*  HCT 26.5* 28.0* 25.2* 25.2*  25.4*  MCV 88.0  --  89.0 89.0 89.4  PLT 231  --  193 209 243   Cardiac Enzymes:  Recent Labs Lab 08/12/13 1725 08/12/13 2250  TROPONINI <0.30 <0.30   BNP (last 3 results)  Recent Labs  02/09/13 1230 02/13/13 1145 02/14/13 0540  PROBNP 2537.0* 2031.0* 1703.0*   CBG:  Recent Labs Lab 08/17/13 1111 08/17/13 1706 08/17/13 2141 08/18/13 0332 08/18/13 0627  GLUCAP 207* 111* 310* 271* 194*    No results found for this or any previous visit (from the past 240 hour(s)).   Studies: US Renal  08/16/2013   *RADIOLOGY REPORT*  Clinical Data: Acute kidney injury.  History of hypertension and diabetes.  RENAL/URINARY TRACT ULTRASOUND COMPLETE  Comparison:  Ultrasound 05/23/2008  Findings:  Right Kidney:  9.2 cm in length.  The lower pole is poorly visualized.  No focal mass or hydronephrosis identified.  Left Kidney:  10.1 cm in length.  Mildly echogenic renal parenchyma.  No focal mass or hydronephrosis identified.  Bladder:  Not visualized.  The patient voided just prior to exam.  Additional findings:  Note is made of bilateral pleural effusions.  IMPRESSION:  1.  No evidence for hydronephrosis. 2.  Decompressed bladder is not evaluated. 3.  Small bilateral pleural effusions.   Original Report Authenticated By: Norva Pavlov, M.D.    Scheduled Meds: . amLODipine  5 mg Oral Daily  . aspirin EC  81 mg Oral Daily  . atorvastatin  20 mg Oral q1800  . carvedilol  12.5 mg Oral BID WC  . furosemide  40 mg Intravenous BID  . insulin aspart  0-9 Units Subcutaneous TID WC  . insulin glargine  20 Units Subcutaneous QHS  . isosorbide-hydrALAZINE  1 tablet Oral TID  . levothyroxine  75 mcg Oral QAC breakfast  . nitroGLYCERIN  0.5 inch Topical Q8H  . pantoprazole  40 mg Oral Daily  . traMADol  100 mg Oral BID   Continuous Infusions:    Renae Fickle  [facility] Pager [phone]. If 7PM-7AM, please contact night-coverage at [email or web address], password [facility] 08/18/2013, 10:48 AM   LOS: 6 days

## 2013-08-18 NOTE — Progress Notes (Signed)
Inpatient Diabetes Program Recommendations  AACE/ADA: New Consensus Statement on Inpatient Glycemic Control (2013)  Target Ranges:  Prepandial:   less than 140 mg/dL      Peak postprandial:   less than 180 mg/dL (1-2 hours)      Critically ill patients:  140 - 180 mg/dL   Reason for Visit: Hyperglycemia  Results for Nicole [name], Nicole [name] (MRN [id number]) as of 08/18/2013 13:25  Ref. Range 08/17/2013 21:41 08/18/2013 03:32 08/18/2013 06:27 08/18/2013 11:15  Glucose-Capillary Latest Range: 70-99 mg/dL 409 (H) 811 (H) 914 (H) 166 (H)    Inpatient Diabetes Program Recommendations Insulin - Meal Coverage: Add meal coverage insulin - Novolog 4 units tidwc  Note: When pt eats 100% of meal, post-prandial blood sugars elevated.  Will continue to follow.  Ailene Ards, RD, LDN, CDE Inpatient Diabetes Coordinator [phone]

## 2013-08-18 NOTE — Consult Note (Signed)
I have personally seen and examined this patient and agree with the assessment/plan as outlined above by Garald Braver MD (PGY2). [name] has ARF on CKD3 likely from acute CHF (diastolic) exacerbation. Agree with increasing diuretic dose to optimize UOP and improvement of volume symptoms. BP elevated on bidil, amlodipine, lasix and coreg---up titration of norvasc dose to 10mg  noted starting tomorrow. Agree with holding ACE-I and ARB for now to limit renal hypoperfusion. Based on the preliminary results of the renal artery duplex- she does not appear to have any stenting targets/indication. Will discuss with cardiology on the role (or lack thereof) of milrinone.   Nicole Faircloth K.,MD 08/18/2013 3:38 PM

## 2013-08-19 LAB — CBC
MCH: 30.1 pg (ref 26.0–34.0)
MCHC: 33.3 g/dL (ref 30.0–36.0)
Platelets: 172 10*3/uL (ref 150–400)
RBC: 2.66 MIL/uL — ABNORMAL LOW (ref 3.87–5.11)

## 2013-08-19 LAB — BASIC METABOLIC PANEL
Calcium: 8.7 mg/dL (ref 8.4–10.5)
GFR calc non Af Amer: 24 mL/min — ABNORMAL LOW (ref >90)
Glucose, Bld: 97 mg/dL (ref 70–99)
Sodium: 134 mEq/L — ABNORMAL LOW (ref 135–145)

## 2013-08-19 LAB — GLUCOSE, CAPILLARY
Glucose-Capillary: 204 mg/dL — ABNORMAL HIGH (ref 70–99)
Glucose-Capillary: 175 mg/dL — ABNORMAL HIGH (ref 70–99)

## 2013-08-19 MED ORDER — SODIUM CHLORIDE 0.9 % IV SOLN
INTRAVENOUS | Status: DC
  Administered 2013-08-19: 10 mL via INTRAVENOUS

## 2013-08-19 MED ORDER — CLONIDINE HCL 0.1 MG PO TABS
0.1000 mg | ORAL_TABLET | Freq: Two times a day (BID) | ORAL | Status: DC
  Administered 2013-08-19 – 2013-08-22 (×6): 0.1 mg via ORAL
  Filled 2013-08-19 (×10): qty 1

## 2013-08-19 NOTE — Progress Notes (Signed)
Nicole [name] Progress Note    Assessment/ Plan:   1 Acute on chronic renal failure - Multifactorial with decreased renal profusion possibly from cardiorenal syndrome (likely type 1), aggressive diuresis, and initiation of antihypertensives. Her creatinine is stable at 2.0. She is not uremic, with normal/low urine output, without increased shortness of breath/work of breathing indicative of worsening pulmonary edema. HD not indicated at this time but this may change. Renal artery Duplex with distant stenosis consistent with chronic kidney disease with no indication for intervention/stenting.  -Would continue first-line therapy of Lasix for CRS, continue trending Creatinine, and continue holding ACEi.   2 Hypertension: BP goal of <140/90 for her CKD and DM2. Pt started on NTG gtt overnight given HTN. -Agree with Norvasc, Coreg, and Bidil.  -Would avoid fast decrease in blood pressure to prevent further renal hypoperfusion.   3. Diastolic CHF: Mild hypervolemia with stable respiratory status, no indication for HD at this time.  -Agree with increased diuresis with Lasix IV.   4. Anemia: No report of bleeding. Baseline Hg at 10-11. Hg currently stable at 8.6. This could be secondary to renal disease. Iron studies indicate microcytic anemia 2/2 chronic disease. (Fe low and transferrin increased)   Subjective:   Pt had some HTN urgency overnight with SBP 190s/40s and transferred to stepdown. Pt started on NTG gtt and still resistant HTN this AM. Pt feels much improved in regards to her SOB and LE edema. No CP or HA or blurry vision this AM. Tolerating a diet w/o n/v/d.    Objective:   BP 166/24  Pulse 50  Temp(Src) 98 F (36.7 C) (Oral)  Resp 14  Ht 5\' 4"  (1.626 m)  Wt 141 lb (63.957 kg)  BMI 24.19 kg/m2  SpO2 96%  Intake/Output Summary (Last 24 hours) at 08/19/13 0802 Last data filed at 08/19/13 0500  Gross per 24 hour  Intake  740.3 ml  Output   2350 ml  Net -1609.7 ml    Weight change: 4 lb 11.2 oz (2.132 kg)  Physical Exam: WGN:FAOZH, awake, NAD, eating breakfast CVS: +JVD 7-8cm, MMM, RRR, 1/4 systolic murmur Resp:diminished BS in bilateral bases, with rales R>L, no wheezes or crackles in other lung fields YQM:VHQI, NT, ND, BS+ Ext:no pitting edema.   Imaging: No results found.  Labs: BMET  Recent Labs Lab 08/13/13 0535 08/14/13 0648 08/15/13 0500 08/16/13 0430 08/17/13 0707 08/18/13 0620 08/19/13 0518  NA 141 137 137 133* 133* 136 134*  K 3.0* 4.3 4.3 4.9 4.9 4.5 4.0  CL 106 105 104 101 102 104 101  CO2 23 23 25 23 21 22 23   GLUCOSE 80 112* 90 254* 129* 203* 97  BUN 45* 42* 45* 49* 47* 49* 46*  CREATININE 1.86* 2.03* 2.30* 2.37* 2.00* 2.01* 1.96*  CALCIUM 8.5 8.4 8.3* 8.2* 8.3* 8.5 8.7   CBC  Recent Labs Lab 08/13/13 0535 08/14/13 0648 08/16/13 1335 08/19/13 0518  WBC 5.2 5.2 6.6 4.6  HGB 8.6* 8.4* 8.6* 8.0*  HCT 25.2* 25.2* 25.4* 24.0*  MCV 89.0 89.0 89.4 90.2  PLT 193 209 243 172    Medications:    . amLODipine  10 mg Oral Daily  . aspirin EC  81 mg Oral Daily  . atorvastatin  20 mg Oral q1800  . carvedilol  12.5 mg Oral BID WC  . furosemide  80 mg Intravenous BID  . insulin aspart  0-9 Units Subcutaneous TID WC  . insulin glargine  20 Units Subcutaneous QHS  .  isosorbide-hydrALAZINE  1 tablet Oral TID  . levothyroxine  75 mcg Oral QAC breakfast  . pantoprazole  40 mg Oral Daily  . traMADol  100 mg Oral BID    Nicole Bame, MD PGY-2  Internal Medicine Pgr: [phone] 08/19/2013, 8:02 AM

## 2013-08-19 NOTE — Progress Notes (Signed)
I have personally seen and examined this patient and agree with the assessment/plan as outlined above by [facility] MD (PGY2). Agree with optimization of diuretics given moderate response so far--will defer to cardiology on need for Milrinone vs dopamine. Do not think that UF is warranted yet given response to diuretics (Can go even higher on dosage). Renal artery dopplers do not show RAS- no obvious stenting targets per duplex report.  Leonora Gores K.,MD 08/19/2013 9:38 AM

## 2013-08-19 NOTE — Progress Notes (Signed)
[facility] PROGRESS NOTE  Assessment/Plan:  Acute diastolic heart failure/Chronic diastolic heart failure, Dry weight is 57. Kg.  Followed by Dr. Delton See.     -  Given lasix 80mg  IV in the AM and 40mg  IV in the PM and -1.6L yesterday -  Increase to lasix 80mg  IV BID today -  Goal of ~ -2L if possible -  Cont. Coreg and bidil -  Continue NTG gtt -  Avoid ACEI in setting of AKI  HYPERTENSION, SBP elevated, but diastolic low, mildly bradycardic.  No AI.  Per cardiology, diastolic may be inaccurate due to PAD.  Could she have a shunt? -  Do we need to worry about coronary perfusion?     -  BP management per cardiology -  Continue bidil TID, carvedilol 12.5mg  BID -  Continue norvasc 10 mg daily  Acute on Chronic kidney disease III/IV:  Baseline Cr 1.3-1.5.  Kidney function improving with diuresis, likely cardiorenal syndomre.   -  Renal US:  No obstruction -  Renal artery duplex:  Abnormal waveforms suggestive of distal or intrarenal obstruction.  Follow up official report.    -  FENa:  Prerenal -  Renally dose medications and minimize nephrotoxins -  Appreciate Nephrology recommendations  Renal artery stenosis:   -  Dr. Gala Romney suggesting CO2 arteriography  PAD (peripheral artery disease):  > 50% stenosis/moderate PAD on [place].   - ASA, axs. - Interventional cardiology once renal function improves  Diarrhea, resolved.  ANEMIA OF CHRONIC DISEASE/GFR < 50 (renal parenchymal disease) - Iron studies:  Possible iron deficiency - Check occult stool - TSH, B12 wnl -  Folate pending  Type 2 diabetes mellitus:  Glucose stable. - Continue current Lantus and SSI.   Hyponatremia due to CHF.   -  Continue diuresis.  Code Status: full Family Communication: none  Disposition Plan: inpatient   Consultants:  Cardiology, Dr. Delton See  Nephrology, Dr. Allena Katz  Procedures:  echo  Antibiotics:  None  HPI/Subjective: Feels a little better today.  Moved to heart center due  to SOB.  Had BM yesterday, voided well with lasix.  Eating some.   Objective: Filed Vitals:   08/19/13 0200 08/19/13 0300 08/19/13 0400 08/19/13 0500  BP: 181/24 182/29 166/24   Pulse: 54 56 50   Temp:   98.1 F (36.7 C)   TempSrc:   Oral   Resp: 12 12 14    Height:      Weight:    63.957 kg (141 lb)  SpO2: 96% 96% 97%     Intake/Output Summary (Last 24 hours) at 08/19/13 0747 Last data filed at 08/19/13 0500  Gross per 24 hour  Intake  740.3 ml  Output   2350 ml  Net -1609.7 ml   Filed Weights   08/17/13 0606 08/18/13 0438 08/19/13 0500  Weight: 62.601 kg (138 lb 0.2 oz) 61.825 kg (136 lb 4.8 oz) 63.957 kg (141 lb)    Exam:  General: Alert, awake, oriented x3, in no acute distress, lying in bed HEENT: No bruits, no goiter. +JVD to preauricular area while sitting upright Heart: Regular rate and rhythm, 1/6 apical murmur, no rubs Lungs:  Diminished and with rales at the bilateral bases right > left to the midback, slightly improved since yesterday, no wheezes or rhonchi, no increased WOB Abdomen: Soft, nontender, nondistended, positive bowel sounds.  MSK:  Bilateral lower extremity slow pitting woody edema.     Data Reviewed: Basic Metabolic Panel:  Recent Labs Lab 08/15/13 0500  08/16/13 0430 08/17/13 0707 08/18/13 0620 08/19/13 0518  NA 137 133* 133* 136 134*  K 4.3 4.9 4.9 4.5 4.0  CL 104 101 102 104 101  CO2 25 23 21 22 23   GLUCOSE 90 254* 129* 203* 97  BUN 45* 49* 47* 49* 46*  CREATININE 2.30* 2.37* 2.00* 2.01* 1.96*  CALCIUM 8.3* 8.2* 8.3* 8.5 8.7   Liver Function Tests:  Recent Labs Lab 08/16/13 0430 08/17/13 0707 08/18/13 0620  AST 15 15 14   ALT 9 10 11   ALKPHOS 98 100 104  BILITOT 0.2* 0.3 0.3  PROT 5.1* 5.1* 5.2*  ALBUMIN 2.1* 2.1* 2.1*   No results found for this basename: LIPASE, AMYLASE,  in the last 168 hours No results found for this basename: AMMONIA,  in the last 168 hours CBC:  Recent Labs Lab 08/12/13 1724 08/12/13 1803  08/13/13 0535 08/14/13 0648 08/16/13 1335 08/19/13 0518  WBC 7.4  --  5.2 5.2 6.6 4.6  HGB 9.2* 9.5* 8.6* 8.4* 8.6* 8.0*  HCT 26.5* 28.0* 25.2* 25.2* 25.4* 24.0*  MCV 88.0  --  89.0 89.0 89.4 90.2  PLT 231  --  193 209 243 172   Cardiac Enzymes:  Recent Labs Lab 08/12/13 1725 08/12/13 2250  TROPONINI <0.30 <0.30   BNP (last 3 results)  Recent Labs  02/09/13 1230 02/13/13 1145 02/14/13 0540  PROBNP 2537.0* 2031.0* 1703.0*   CBG:  Recent Labs Lab 08/18/13 0332 08/18/13 0627 08/18/13 1115 08/18/13 1558 08/18/13 2128  GLUCAP 271* 194* 166* 88 165*    No results found for this or any previous visit (from the past 240 hour(s)).   Studies: No results found.  Scheduled Meds: . amLODipine  10 mg Oral Daily  . aspirin EC  81 mg Oral Daily  . atorvastatin  20 mg Oral q1800  . carvedilol  12.5 mg Oral BID WC  . furosemide  80 mg Intravenous BID  . insulin aspart  0-9 Units Subcutaneous TID WC  . insulin glargine  20 Units Subcutaneous QHS  . isosorbide-hydrALAZINE  1 tablet Oral TID  . levothyroxine  75 mcg Oral QAC breakfast  . pantoprazole  40 mg Oral Daily  . traMADol  100 mg Oral BID   Continuous Infusions: . nitroGLYCERIN 10 mcg/min (08/18/13 2230)     Nicole [name]  [facility] Pager [phone]. If 7PM-7AM, please contact night-coverage at [email or web address], password [facility] 08/19/2013, 7:47 AM  LOS: 7 days

## 2013-08-19 NOTE — Progress Notes (Signed)
Patient Name: Nicole [name] Date of Encounter: 08/19/2013     SUBJECTIVE  Moved to 2900 for consideration of possible placement of central access for invasive monitoring and possible inotropic support due to cardiorenal syndrome.   Weight this am is recorded as 141 which is up 5 pounds from up on 4E. That said has had good response to 80 IV lasix this am. (I/o yesterday -2.1L) BP remains very high with very large pulse pressure 190/30. Renal function stable at 1.96  Says her leg edema is much improved but still a bit bloated. No dyspnea. Home weight is 133.  CURRENT MEDS . amLODipine  10 mg Oral Daily  . aspirin EC  81 mg Oral Daily  . atorvastatin  20 mg Oral q1800  . carvedilol  12.5 mg Oral BID WC  . furosemide  80 mg Intravenous BID  . insulin aspart  0-9 Units Subcutaneous TID WC  . insulin glargine  20 Units Subcutaneous QHS  . isosorbide-hydrALAZINE  1 tablet Oral TID  . levothyroxine  75 mcg Oral QAC breakfast  . pantoprazole  40 mg Oral Daily  . traMADol  100 mg Oral BID    OBJECTIVE  Filed Vitals:   08/19/13 0400 08/19/13 0500 08/19/13 0700 08/19/13 0800  BP: 166/24  200/30 194/30  Pulse: 50  56 63  Temp: 98.1 F (36.7 C)   98 F (36.7 C)  TempSrc: Oral   Oral  Resp: 14  14 18   Height:      Weight:  63.957 kg (141 lb)    SpO2: 97%  96% 96%    Intake/Output Summary (Last 24 hours) at 08/19/13 1020 Last data filed at 08/19/13 0800  Gross per 24 hour  Intake  626.3 ml  Output   2800 ml  Net -2173.7 ml   Filed Weights   08/17/13 0606 08/18/13 0438 08/19/13 0500  Weight: 62.601 kg (138 lb 0.2 oz) 61.825 kg (136 lb 4.8 oz) 63.957 kg (141 lb)    PHYSICAL EXAM  General: Pleasant, NAD. Neuro: Alert and oriented X 3. Moves all extremities spontaneously. Psych: Normal affect. HEENT:  Normal  Neck: Supple without bruit, JVD + 9-10 cm. Lungs:  Resp regular and unlabored, CTA Heart: RRR no s3, s4, 2/6 SEM RSB no AI noted Abdomen: Soft, non-tender,  minimally distended, BS + x 4.  Extremities: No clubbing, cyanosis, tr-1+ edema. DP/PT weak B/L  Accessory Clinical Findings  CBC  Recent Labs  08/16/13 1335 08/19/13 0518  WBC 6.6 4.6  HGB 8.6* 8.0*  HCT 25.4* 24.0*  MCV 89.4 90.2  PLT 243 172   Basic Metabolic Panel  Recent Labs  08/18/13 0620 08/19/13 0518  NA 136 134*  K 4.5 4.0  CL 104 101  CO2 22 23  GLUCOSE 203* 97  BUN 49* 46*  CREATININE 2.01* 1.96*  CALCIUM 8.5 8.7   Liver Function Tests  Recent Labs  08/17/13 0707 08/18/13 0620  AST 15 14  ALT 10 11  ALKPHOS 100 104  BILITOT 0.3 0.3  PROT 5.1* 5.2*  ALBUMIN 2.1* 2.1*   No results found for this basename: LIPASE, AMYLASE,  in the last 72 hours Cardiac Enzymes No results found for this basename: CKTOTAL, CKMB, CKMBINDEX, TROPONINI,  in the last 72 hours BNP No components found with this basename: POCBNP,  D-Dimer No results found for this basename: DDIMER,  in the last 72 hours Hemoglobin A1C No results found for this basename: HGBA1C,  in the last  72 hours Fasting Lipid Panel No results found for this basename: CHOL, HDL, LDLCALC, TRIG, CHOLHDL, LDLDIRECT,  in the last 72 hours Thyroid Function Tests  Recent Labs  08/18/13 0620  TSH 10.001*    TELE  SR, HR 60'  Dg Chest 2 View  08/12/2013   *RADIOLOGY REPORT*  Clinical Data: Short of breath  CHEST - 2 VIEW  Comparison: 02/12/2013  Findings: Normal cardiac silhouette.  There is fine interstitial pattern with a linear markings laterally.  Breast tissue node on the left and right.  There is blunting of the costophrenic angles slightly improved compared to prior.  IMPRESSION: Small bilateral pleural effusions and interstitial edema.   Original Report Authenticated By: Genevive Bi, M.D.   US Renal  08/16/2013   *RADIOLOGY REPORT*  Clinical Data: Acute kidney injury.  History of hypertension and diabetes.  RENAL/URINARY TRACT ULTRASOUND COMPLETE  Comparison:  Ultrasound 05/23/2008   Findings:  Right Kidney:  9.2 cm in length.  The lower pole is poorly visualized.  No focal mass or hydronephrosis identified.  Left Kidney:  10.1 cm in length.  Mildly echogenic renal parenchyma.  No focal mass or hydronephrosis identified.  Bladder:  Not visualized.  The patient voided just prior to exam.  Additional findings:  Note is made of bilateral pleural effusions.  IMPRESSION:  1.  No evidence for hydronephrosis. 2.  Decompressed bladder is not evaluated. 3.  Small bilateral pleural effusions.   Original Report Authenticated By: Norva Pavlov, M.D.   Arterial Duplex:  Summary: Findings consistent with >50% stenosis of the right common femoral artery into the right profunda femoral and femoral arteries. There is somecollateral flow noted.  Bilateral ABIs are suggestive of moderate arterial insufficiency.  Renal artery ultrasound Renal Artery Duplex has been completed. Preliminary findings: Normal renal artery velocities. Atypical waveforms and abnormal resistive indices suggestive of distal/ intrarenal occlusion bilaterally.  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   ASSESSMENT Nicole [name] is a 78 year old female with multiple medical problems including CKD, CAD and HFpEF admitted for acute on chronic heart failure and chest pain with ECG changes in the settings of hypertensive urgency.   1. Acute on chronic diastolic heart failure 2. Resistant hypertension with wide pulse pressure 3. Acute on chronic kidney failure, stage IV 4. CAD - chest pain, negative Troponin, now pain free  5. Peripheral arterial disease  Plan/Discussion:  She seems to be responding well to IV lasix now, and renal function is stable though somewhat tenuous. I reweighed her personally and weight is 133 today which is very close to her home weight. On exam, volume status is improving slowly but still up. At this point I would be inclined to continue therapy with lasix 80 IV  bid. I am not sure she would benefit much from milrinone given her preserved LV function and if I were to use any inotrope I would favor dopamine to improve renal blood flow. (given lack of central access will hold off on that). Will see response in am.  With regard to her HTN, I reviewed her Duplex study and d/w Dr. Excell Seltzer.  I am still concerned about RAS. With persistent, malignant HTN I do think it is reasonable to do CO2 angiography and we will schedule her for this on Monday with Dr. Excell Seltzer. (please hold lasix starting tomorrow afternoon). Will add low dose clonidine for her HTN.  Zarielle Cea,MD 10:44 AM

## 2013-08-20 DIAGNOSIS — R609 Edema, unspecified: Secondary | ICD-10-CM

## 2013-08-20 DIAGNOSIS — I774 Celiac artery compression syndrome: Secondary | ICD-10-CM

## 2013-08-20 DIAGNOSIS — K551 Chronic vascular disorders of intestine: Secondary | ICD-10-CM

## 2013-08-20 DIAGNOSIS — I739 Peripheral vascular disease, unspecified: Secondary | ICD-10-CM

## 2013-08-20 LAB — CBC
MCH: 30.2 pg (ref 26.0–34.0)
MCV: 90.8 fL (ref 78.0–100.0)
Platelets: 168 10*3/uL (ref 150–400)
RDW: 13.5 % (ref 11.5–15.5)
WBC: 4.8 10*3/uL (ref 4.0–10.5)

## 2013-08-20 LAB — BASIC METABOLIC PANEL
CO2: 24 mEq/L (ref 19–32)
Calcium: 8.7 mg/dL (ref 8.4–10.5)
Creatinine, Ser: 2.13 mg/dL — ABNORMAL HIGH (ref 0.50–1.10)
GFR calc non Af Amer: 21 mL/min — ABNORMAL LOW (ref >90)
Glucose, Bld: 122 mg/dL — ABNORMAL HIGH (ref 70–99)
Sodium: 136 mEq/L (ref 135–145)

## 2013-08-20 MED ORDER — SODIUM CHLORIDE 0.9 % IV SOLN: 250.0000 mL | INTRAVENOUS | Status: DC | PRN

## 2013-08-20 MED ORDER — SODIUM CHLORIDE 0.9 % IJ SOLN: 3.0000 mL | INTRAMUSCULAR | Status: DC | PRN

## 2013-08-20 MED ORDER — SODIUM CHLORIDE 0.9 % IV SOLN
INTRAVENOUS | Status: DC
  Administered 2013-08-21: 50 mL via INTRAVENOUS

## 2013-08-20 MED ORDER — SODIUM CHLORIDE 0.9 % IJ SOLN
3.0000 mL | Freq: Two times a day (BID) | INTRAMUSCULAR | Status: DC
  Administered 2013-08-20: 11:00:00 via INTRAVENOUS
  Administered 2013-08-20 – 2013-08-21 (×2): 3 mL via INTRAVENOUS

## 2013-08-20 MED ORDER — ASPIRIN 81 MG PO CHEW
324.0000 mg | CHEWABLE_TABLET | ORAL | Status: AC
  Administered 2013-08-21: 324 mg via ORAL
  Filled 2013-08-20: qty 4

## 2013-08-20 NOTE — Progress Notes (Addendum)
[facility] Progress Note    Assessment/ Plan:   1 Acute on chronic renal failure - Multifactorial with decreased renal profusion possibly from cardiorenal syndrome (likely type 1), aggressive diuresis, and initiation of antihypertensives. Her creatinine is stable at 2.13. She is not uremic, with normal urine output, without increased shortness of breath/work of breathing indicative of worsening pulmonary edema. HD not indicated at this time but this may change. Renal artery Duplex with distal stenosis consistent with chronic kidney disease with no indication (or site) for intervention/stenting.  -Would continue first-line therapy of Lasix for CRS, continue trending Creatinine, and continue holding ACEi.   2 Hypertension: BP goal of <140/90 for her CKD and DM2. Pt constinues on NTG gtt with improved HTN.  -Agree with Norvasc, Coreg, Bidil, and clonidine.  -Would avoid fast decrease in blood pressure to prevent further renal hypoperfusion.   3. Diastolic CHF: Mild hypervolemia with stable respiratory status, no indication for HD at this time. Pt close to home weight.  -Agree with increased diuresis with Lasix IV.   4. Anemia: No report of bleeding. Baseline Hg at 10-11. Hg currently stable at 7.9. This could be secondary to renal disease. Iron studies indicate microcytic anemia 2/2 chronic disease. (Fe low and transferrin increased)   Subjective:   Pt had had increased diuresis overnight with Lasix IV 80mg  IV. Her SOB is improved. She is tolerating a diet well with no n/v/d.    Objective:   BP 151/27  Pulse 54  Temp(Src) 98 F (36.7 C) (Oral)  Resp 13  Ht 5\' 4"  (1.626 m)  Wt 130 lb 15.3 oz (59.4 kg)  BMI 22.47 kg/m2  SpO2 97%  Intake/Output Summary (Last 24 hours) at 08/20/13 0848 Last data filed at 08/20/13 0600  Gross per 24 hour  Intake    786 ml  Output   1550 ml  Net   -764 ml   Weight change: -8 lb (-3.629 kg)  Physical Exam: MWN:UUVOZ, awake, NAD, very  pleasant CVS: +JVD 7-8cm, MMM, RRR, 2/6 systolic murmur heard best at the RSB Resp:diminished BS in bilateral bases, with rales, no wheezes or crackles in other lung fields DGU:YQIH, NT, ND, BS+ Ext: no pitting edema.   Imaging: No results found.  Labs: BMET  Recent Labs Lab 08/14/13 0648 08/15/13 0500 08/16/13 0430 08/17/13 0707 08/18/13 0620 08/19/13 0518 08/20/13 0425  NA 137 137 133* 133* 136 134* 136  K 4.3 4.3 4.9 4.9 4.5 4.0 4.0  CL 105 104 101 102 104 101 102  CO2 23 25 23 21 22 23 24   GLUCOSE 112* 90 254* 129* 203* 97 122*  BUN 42* 45* 49* 47* 49* 46* 48*  CREATININE 2.03* 2.30* 2.37* 2.00* 2.01* 1.96* 2.13*  CALCIUM 8.4 8.3* 8.2* 8.3* 8.5 8.7 8.7   CBC  Recent Labs Lab 08/14/13 0648 08/16/13 1335 08/19/13 0518 08/20/13 0425  WBC 5.2 6.6 4.6 4.8  HGB 8.4* 8.6* 8.0* 7.9*  HCT 25.2* 25.4* 24.0* 23.8*  MCV 89.0 89.4 90.2 90.8  PLT 209 243 172 168    Medications:    . amLODipine  10 mg Oral Daily  . aspirin EC  81 mg Oral Daily  . atorvastatin  20 mg Oral q1800  . carvedilol  12.5 mg Oral BID WC  . cloNIDine  0.1 mg Oral BID  . furosemide  80 mg Intravenous BID  . insulin aspart  0-9 Units Subcutaneous TID WC  . insulin glargine  20 Units Subcutaneous QHS  .  isosorbide-hydrALAZINE  1 tablet Oral TID  . levothyroxine  75 mcg Oral QAC breakfast  . pantoprazole  40 mg Oral Daily  . sodium chloride  3 mL Intravenous Q12H  . traMADol  100 mg Oral BID    Sara Chu, MD  PGY-2  Internal Medicine Pgr: [phone] 08/20/2013, 8:48 AM   I have personally seen and examined this patient and agree with the assessment/plan as outlined above by Garald Braver MD (PGY2). Improving CHF symptoms with fixed UOP and now back to her home "dry weight"- CO2 angiogram tomorrow to evaluate for RAS. Silver Parkey K.,MD 08/20/2013 9:55 AM

## 2013-08-20 NOTE — Progress Notes (Signed)
SUBJECTIVE:  She has no acute complaints.  Breathing is unchanged.  No pain.   PHYSICAL EXAM Filed Vitals:   08/20/13 0200 08/20/13 0327 08/20/13 0500 08/20/13 0755  BP: 146/22 151/27    Pulse:      Temp:  98.6 F (37 C)  98 F (36.7 C)  TempSrc:  Oral  Oral  Resp: 15 15 13    Height:      Weight:   130 lb 15.3 oz (59.4 kg)   SpO2:  97%     General:  No distress Lungs:  Clear Heart:  RRR Abdomen:  Positive bowel sounds, no rebound no guarding Extremities:  No edema  LABS:  Results for orders placed during the hospital encounter of 08/12/13 (from the past 24 hour(s))  GLUCOSE, CAPILLARY     Status: Abnormal   Collection Time    08/19/13 11:46 AM      Result Value Range   Glucose-Capillary 175 (*) 70 - 99 mg/dL  GLUCOSE, CAPILLARY     Status: Abnormal   Collection Time    08/19/13  4:39 PM      Result Value Range   Glucose-Capillary 204 (*) 70 - 99 mg/dL  GLUCOSE, CAPILLARY     Status: Abnormal   Collection Time    08/19/13  9:47 PM      Result Value Range   Glucose-Capillary 175 (*) 70 - 99 mg/dL  BASIC METABOLIC PANEL     Status: Abnormal   Collection Time    08/20/13  4:25 AM      Result Value Range   Sodium 136  135 - 145 mEq/L   Potassium 4.0  3.5 - 5.1 mEq/L   Chloride 102  96 - 112 mEq/L   CO2 24  19 - 32 mEq/L   Glucose, Bld 122 (*) 70 - 99 mg/dL   BUN 48 (*) 6 - 23 mg/dL   Creatinine, Ser 7.84 (*) 0.50 - 1.10 mg/dL   Calcium 8.7  8.4 - 69.6 mg/dL   GFR calc non Af Amer 21 (*) >90 mL/min   GFR calc Af Amer 25 (*) >90 mL/min  CBC     Status: Abnormal   Collection Time    08/20/13  4:25 AM      Result Value Range   WBC 4.8  4.0 - 10.5 K/uL   RBC 2.62 (*) 3.87 - 5.11 MIL/uL   Hemoglobin 7.9 (*) 12.0 - 15.0 g/dL   HCT 29.5 (*) 28.4 - 13.2 %   MCV 90.8  78.0 - 100.0 fL   MCH 30.2  26.0 - 34.0 pg   MCHC 33.2  30.0 - 36.0 g/dL   RDW 44.0  10.2 - 72.5 %   Platelets 168  150 - 400 K/uL  GLUCOSE, CAPILLARY     Status: None   Collection Time   08/20/13  7:53 AM      Result Value Range   Glucose-Capillary 84  70 - 99 mg/dL    Intake/Output Summary (Last 24 hours) at 08/20/13 0847 Last data filed at 08/20/13 0600  Gross per 24 hour  Intake    786 ml  Output   1550 ml  Net   -764 ml     ASSESSMENT AND PLAN:  Acute on chronic diastolic heart failure :  Some urine output yesterday.  Weight seems to be down 3 lbs.  No plan for milrinone.  Hold Lasix this PM for the angiogram in the AM.   Resistant hypertension  with wide pulse pressure:  Clonidine added yesterday.  She is to have a CO2 renal angiogram in the am.   I will continue current therapy with IV NTG today.   Acute on chronic kidney failure, stage IV:  Creat is up slightly.  Angiogram in AM.   CAD - Chest pain, negative Troponin, now pain free    Rollene Rotunda 08/20/2013 8:47 AM

## 2013-08-20 NOTE — Progress Notes (Signed)
[facility] PROGRESS NOTE  Assessment/Plan:  Acute diastolic heart failure/Chronic diastolic heart failure, Dry weight is 57. Kg.  Followed by Dr. Delton See.   -  Diuresis per cardiology, afternoon dose of Lasix to be held prior to possible procedure -  -1.1 L, weight down 1 kg -  Cont. BB, hydralazine  -  Continue NTG gtt, now increased to 10 mcg per minute -  Avoid ACEI in setting of AKI -  Appreciate cardiology recommendations  HYPERTENSION, SBP elevated, but diastolic low, mildly bradycardic.  No AI.  Likely due to severe peripheral arterial disease.   -  Continue bidil TID, carvedilol 12.5mg  BID, Clonidine started 9/13 -  Continue norvasc 10 mg daily  Acute on Chronic kidney disease III/IV:  Baseline Cr 1.3-1.5.  Kidney function stable with diuresis. She may have perhaps had a slow decline in her kidney function in the last several months with a new baseline creatinine of around 2.  -  Renal US:  No obstruction -  Renal artery duplex:  Normal velocities.  Abnormal waveforms suggestive of distal or intrarenal obstruction.  Follow up official report.    -  FENa:  Prerenal -  Renally dose medications and minimize nephrotoxins -  Appreciate Nephrology recommendations:  Renal duplex suggests CKD.  Is suspicion of renal artery stenosis high enough to justify procedure tomorrow?  PAD (peripheral artery disease):  > 50% stenosis/moderate PAD on [place].  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 - Interventional cardiology once renal function improves  Diarrhea, resolved.  ANEMIA OF CHRONIC DISEASE/GFR < 50 (renal parenchymal disease) - Iron studies suggestive of anemia of chronic disease. - Check occult stool - TSH, B12 wnl -  Folate pending  Type 2 diabetes mellitus:  Glucose stable. - Continue current Lantus and SSI.   Hyponatremia due to CHF.   -  Continue diuresis.  Code Status: full Family Communication: none  Disposition Plan:  inpatient   Consultants:  Cardiology, Dr. Delton See  Nephrology, Dr. Allena Katz  Procedures:  echo  Antibiotics:  None  HPI/Subjective: Feels a little better today.  Shortness of breath improved, mild cough. Able to lie more flat. Legs and abdomen feel less swollen.   Objective: Filed Vitals:   08/20/13 0200 08/20/13 0327 08/20/13 0500 08/20/13 0755  BP: 146/22 151/27    Pulse:      Temp:  98.6 F (37 C)  98 F (36.7 C)  TempSrc:  Oral  Oral  Resp: 15 15 13    Height:      Weight:   59.4 kg (130 lb 15.3 oz)   SpO2:  97%      Intake/Output Summary (Last 24 hours) at 08/20/13 1009 Last data filed at 08/20/13 0600  Gross per 24 hour  Intake    780 ml  Output   1050 ml  Net   -270 ml   Filed Weights   08/19/13 0500 08/19/13 0730 08/20/13 0500  Weight: 63.957 kg (141 lb) 60.328 kg (133 lb) 59.4 kg (130 lb 15.3 oz)    Exam:  General: Alert, awake, oriented x3, in no acute distress, lying in bed HEENT: No bruits, no goiter.  Heart: Regular rate and rhythm, 1/6 apical murmur, no rubs Lungs:  Diminished with rales at the bilateral bases, no wheezes or rhonchi, no increased WOB Abdomen: Soft, nontender, nondistended, positive bowel sounds.  MSK:  Bilateral lower extremity slow pitting woody edema that is much softer than before.     Data Reviewed:  Basic Metabolic Panel:  Recent Labs Lab 08/16/13 0430 08/17/13 0707 08/18/13 0620 08/19/13 0518 08/20/13 0425  NA 133* 133* 136 134* 136  K 4.9 4.9 4.5 4.0 4.0  CL 101 102 104 101 102  CO2 23 21 22 23 24   GLUCOSE 254* 129* 203* 97 122*  BUN 49* 47* 49* 46* 48*  CREATININE 2.37* 2.00* 2.01* 1.96* 2.13*  CALCIUM 8.2* 8.3* 8.5 8.7 8.7   Liver Function Tests:  Recent Labs Lab 08/16/13 0430 08/17/13 0707 08/18/13 0620  AST 15 15 14   ALT 9 10 11   ALKPHOS 98 100 104  BILITOT 0.2* 0.3 0.3  PROT 5.1* 5.1* 5.2*  ALBUMIN 2.1* 2.1* 2.1*   No results found for this basename: LIPASE, AMYLASE,  in the last 168  hours No results found for this basename: AMMONIA,  in the last 168 hours CBC:  Recent Labs Lab 08/14/13 0648 08/16/13 1335 08/19/13 0518 08/20/13 0425  WBC 5.2 6.6 4.6 4.8  HGB 8.4* 8.6* 8.0* 7.9*  HCT 25.2* 25.4* 24.0* 23.8*  MCV 89.0 89.4 90.2 90.8  PLT 209 243 172 168   Cardiac Enzymes: No results found for this basename: CKTOTAL, CKMB, CKMBINDEX, TROPONINI,  in the last 168 hours BNP (last 3 results)  Recent Labs  02/09/13 1230 02/13/13 1145 02/14/13 0540  PROBNP 2537.0* 2031.0* 1703.0*   CBG:  Recent Labs Lab 08/19/13 0801 08/19/13 1146 08/19/13 1639 08/19/13 2147 08/20/13 0753  GLUCAP 79 175* 204* 175* 84    No results found for this or any previous visit (from the past 240 hour(s)).   Studies: No results found.  Scheduled Meds: . amLODipine  10 mg Oral Daily  . aspirin EC  81 mg Oral Daily  . atorvastatin  20 mg Oral q1800  . carvedilol  12.5 mg Oral BID WC  . cloNIDine  0.1 mg Oral BID  . insulin aspart  0-9 Units Subcutaneous TID WC  . insulin glargine  20 Units Subcutaneous QHS  . isosorbide-hydrALAZINE  1 tablet Oral TID  . levothyroxine  75 mcg Oral QAC breakfast  . pantoprazole  40 mg Oral Daily  . sodium chloride  3 mL Intravenous Q12H  . traMADol  100 mg Oral BID   Continuous Infusions: . sodium chloride 10 mL (08/19/13 2154)  . nitroGLYCERIN 10 mcg/min (08/19/13 2014)     Renae Fickle  [facility] Pager [phone]. If 7PM-7AM, please contact night-coverage at [email or web address], password [facility] 08/20/2013, 10:09 AM  LOS: 8 days

## 2013-08-20 NOTE — Progress Notes (Signed)
Pt educated of procedure for Monday September 15. Questions answered.  Pt did not want to watch video.   Consent obtained.  Vs stable. Will continue to monitor. Nicole [name] [place]

## 2013-08-21 ENCOUNTER — Encounter (HOSPITAL_COMMUNITY)
Admission: EM | Disposition: A | Discharge status: Home / Self Care | Payer: Self-pay | Source: Home / Self Care | Attending: Internal Medicine

## 2013-08-21 DIAGNOSIS — I10 Essential (primary) hypertension: Secondary | ICD-10-CM

## 2013-08-21 HISTORY — PX: RENAL ANGIOGRAM: SHX5509

## 2013-08-21 LAB — FOLATE RBC: RBC Folate: 1336 ng/mL — ABNORMAL HIGH (ref >=366)

## 2013-08-21 LAB — CBC
MCH: 30.2 pg (ref 26.0–34.0)
MCHC: 33.2 g/dL (ref 30.0–36.0)
MCV: 91 fL (ref 78.0–100.0)
Platelets: 183 10*3/uL (ref 150–400)
RDW: 13.8 % (ref 11.5–15.5)

## 2013-08-21 LAB — BASIC METABOLIC PANEL
BUN: 51 mg/dL — ABNORMAL HIGH (ref 6–23)
CO2: 24 mEq/L (ref 19–32)
Calcium: 8.8 mg/dL (ref 8.4–10.5)
Creatinine, Ser: 2.37 mg/dL — ABNORMAL HIGH (ref 0.50–1.10)
GFR calc non Af Amer: 19 mL/min — ABNORMAL LOW (ref >90)
Glucose, Bld: 144 mg/dL — ABNORMAL HIGH (ref 70–99)

## 2013-08-21 LAB — PARATHYROID HORMONE, INTACT (NO CA): PTH: 158.4 pg/mL — ABNORMAL HIGH (ref 14.0–72.0)

## 2013-08-21 LAB — GLUCOSE, CAPILLARY
Glucose-Capillary: 126 mg/dL — ABNORMAL HIGH (ref 70–99)
Glucose-Capillary: 93 mg/dL (ref 70–99)
Glucose-Capillary: 326 mg/dL — ABNORMAL HIGH (ref 70–99)

## 2013-08-21 SURGERY — RENAL ANGIOGRAM
Anesthesia: LOCAL

## 2013-08-21 MED ORDER — SALINE SPRAY 0.65 % NA SOLN
1.0000 | NASAL | Status: DC | PRN
  Filled 2013-08-21 (×2): qty 44

## 2013-08-21 MED ORDER — SODIUM CHLORIDE 0.9 % IJ SOLN
3.0000 mL | Freq: Two times a day (BID) | INTRAMUSCULAR | Status: DC
  Administered 2013-08-21 – 2013-08-22 (×2): 3 mL via INTRAVENOUS

## 2013-08-21 MED ORDER — SODIUM CHLORIDE 0.9 % IV SOLN: 250.0000 mL | INTRAVENOUS | Status: DC | PRN

## 2013-08-21 MED ORDER — SODIUM CHLORIDE 0.9 % IJ SOLN: 3.0000 mL | INTRAMUSCULAR | Status: DC | PRN

## 2013-08-21 MED ORDER — LIDOCAINE HCL (PF) 1 % IJ SOLN
INTRAMUSCULAR | Status: AC
  Filled 2013-08-21: qty 30

## 2013-08-21 MED ORDER — FENTANYL CITRATE 0.05 MG/ML IJ SOLN
INTRAMUSCULAR | Status: AC
  Filled 2013-08-21: qty 2

## 2013-08-21 MED ORDER — DEXTROSE 50 % IV SOLN
1.0000 | Freq: Once | INTRAVENOUS | Status: AC
  Administered 2013-08-21: 50 mL via INTRAVENOUS
  Filled 2013-08-21: qty 50

## 2013-08-21 MED ORDER — ONDANSETRON HCL 4 MG/2ML IJ SOLN: 4.0000 mg | Freq: Four times a day (QID) | INTRAMUSCULAR | Status: DC | PRN

## 2013-08-21 MED ORDER — MIDAZOLAM HCL 2 MG/2ML IJ SOLN
INTRAMUSCULAR | Status: AC
  Filled 2013-08-21: qty 2

## 2013-08-21 MED ORDER — ACETAMINOPHEN 325 MG PO TABS: 650.0000 mg | ORAL_TABLET | ORAL | Status: DC | PRN

## 2013-08-21 NOTE — Progress Notes (Signed)
PT Cancellation Note  Patient Details Name: Nicole [name] MRN: [id number] DOB: [birth year]   Cancelled Treatment:    Reason Eval/Treat Not Completed: Patient at procedure or test/unavailable. Pt at cath lab. PT to return as able.   Marcene Brawn 08/21/2013, 3:10 PM

## 2013-08-21 NOTE — CV Procedure (Signed)
   Cardiac Catheterization Procedure Note  Name: Nicole [name] MRN: [id number] DOB: [birth year]  Procedure: CO2 Renal Angiography  Indication: malignant HTN, CKD   Procedural details: The right groin was prepped, draped, and anesthetized with 1% lidocaine. Using modified Seldinger technique, a 5 French sheath was introduced into the right femoral artery. A pigtail catheter was advanced into the suprarenal aorta and CO2 angiography was performed under digital subtraction angiography in multiple planes. There were no immediate complications.  Procedural Findings: The abdominal aorta is patent. There are single renal arteries bilaterally. The aorta and proximal renal arteries are heavily calcified but widely patent bilaterally.   Final Conclusions:  Patent renal arteries bilaterally.  [name] 08/21/2013, 3:37 PM

## 2013-08-21 NOTE — Progress Notes (Addendum)
[facility] Progress Note    Assessment/ Plan:   1 Acute on chronic renal failure - Multifactorial with decreased renal profusion from cardiorenal syndrome (likely type 1), aggressive diuresis, and initiation of antihypertensives. Her creatinine is slightly increased today to 2.27 from 2.13 after larger dose of Lasix. She is not uremic, with normal urine output, without increased shortness of breath or work of breathing or signs of pulmonary edema. HD not indicated at this time but this may change. Renal artery Duplex with distal stenosis consistent with chronic kidney disease with no indication (or site) for intervention/stenting. She will undergo CO2 angiography today for further evaluation of her renal arteries.  -Would continue first-line therapy of Lasix for CRS as needed after cath, continue trending Creatinine, and continue holding ACEi.  -Agree with pre-cath hydration and minimum to no use of contrast if possible for angiography.   2 Hypertension: BP goal of <140/90 for her CKD and DM2. NTG gtt discontinued last night. Her BP seems to range with wide pulse pressure and she will undergo CO2 renal angiogram for further evaluation of possible RAS.  -Agree with Norvasc, Coreg, Bidil, and clonidine.  -Would avoid fast decrease in blood pressure to prevent further renal hypoperfusion.   3. Diastolic CHF: Mild hypervolemia with stable respiratory status, no indication for HD at this time. Pt at her home "dry weight" now.  -Agree with increased diuresis with Lasix IV as needed after cath.    4. Anemia: No report of bleeding. Baseline Hg at 10-11. Hg currently stable at 8.1. This could be secondary to renal disease. Iron studies indicate microcytic anemia 2/2 chronic disease. (Fe low and transferrin increased)   Subjective:   She feels much better today, her only complaint is that she is hungry since being NPO for her procedure today.    Objective:   BP 169/70  Pulse 54   Temp(Src) 98.9 F (37.2 C) (Oral)  Resp 13  Ht 5\' 4"  (1.626 m)  Wt 130 lb 4.7 oz (59.1 kg)  BMI 22.35 kg/m2  SpO2 94%  Intake/Output Summary (Last 24 hours) at 08/21/13 0824 Last data filed at 08/21/13 0700  Gross per 24 hour  Intake  555.5 ml  Output   1101 ml  Net -545.5 ml   Weight change: -2 lb 11.3 oz (-1.228 kg)  Physical Exam: EAV:WUJWJ, awake, NAD, very pleasant CVS: +JVD 7-8cm, MMM, RRR, 2/6 systolic murmur heard best at the RSB Resp:Diminished BS in bilateral bases, with rales, no wheezes or crackles in other lung fields XBJ:YNWG, NT, ND, BS+ Ext: no pitting edema.   Imaging: No results found.  Labs: BMET  Recent Labs Lab 08/15/13 0500 08/16/13 0430 08/17/13 0707 08/18/13 0620 08/19/13 0518 08/20/13 0425 08/21/13 0543  NA 137 133* 133* 136 134* 136 138  K 4.3 4.9 4.9 4.5 4.0 4.0 3.9  CL 104 101 102 104 101 102 103  CO2 25 23 21 22 23 24 24   GLUCOSE 90 254* 129* 203* 97 122* 144*  BUN 45* 49* 47* 49* 46* 48* 51*  CREATININE 2.30* 2.37* 2.00* 2.01* 1.96* 2.13* 2.37*  CALCIUM 8.3* 8.2* 8.3* 8.5 8.7 8.7 8.8   CBC  Recent Labs Lab 08/16/13 1335 08/19/13 0518 08/20/13 0425 08/21/13 0543  WBC 6.6 4.6 4.8 5.3  HGB 8.6* 8.0* 7.9* 8.1*  HCT 25.4* 24.0* 23.8* 24.4*  MCV 89.4 90.2 90.8 91.0  PLT 243 172 168 183    Medications:    . amLODipine  10 mg Oral  Daily  . aspirin EC  81 mg Oral Daily  . atorvastatin  20 mg Oral q1800  . carvedilol  12.5 mg Oral BID WC  . cloNIDine  0.1 mg Oral BID  . insulin aspart  0-9 Units Subcutaneous TID WC  . insulin glargine  20 Units Subcutaneous QHS  . isosorbide-hydrALAZINE  1 tablet Oral TID  . levothyroxine  75 mcg Oral QAC breakfast  . pantoprazole  40 mg Oral Daily  . sodium chloride  3 mL Intravenous Q12H  . traMADol  100 mg Oral BID    Sara Chu, MD  PGY-2  Internal Medicine Pgr: [phone] 08/21/2013, 8:17 AM  I have seen and examined this patient and agree with plan Dr Wyn Quaker.  To go  for renal artery CO2 angiogram today.  Cont to follow renal fx daily.  Volume status looks OK. Oberon Hehir T,MD 08/21/2013 10:15 AM

## 2013-08-21 NOTE — H&P (View-Only) (Signed)
Patient Name: Nicole [name] Date of Encounter: 08/21/2013     Principal Problem:   Acute diastolic heart failure Active Problems:   ANEMIA OF CHRONIC DISEASE   HYPERTENSION   Chronic diastolic heart failure   PAD (peripheral artery disease)   Type 2 diabetes mellitus   Chronic kidney disease, stage III (moderate)   AKI (acute kidney injury)   Peripheral arterial disease   Celiac artery stenosis   Mesenteric artery stenosis    SUBJECTIVE  The patient had a good night, breathing is significantly improved.    CURRENT MEDS . amLODipine  10 mg Oral Daily  . aspirin EC  81 mg Oral Daily  . atorvastatin  20 mg Oral q1800  . carvedilol  12.5 mg Oral BID WC  . cloNIDine  0.1 mg Oral BID  . insulin aspart  0-9 Units Subcutaneous TID WC  . insulin glargine  20 Units Subcutaneous QHS  . isosorbide-hydrALAZINE  1 tablet Oral TID  . levothyroxine  75 mcg Oral QAC breakfast  . pantoprazole  40 mg Oral Daily  . sodium chloride  3 mL Intravenous Q12H  . traMADol  100 mg Oral BID    OBJECTIVE  Filed Vitals:   08/20/13 1934 08/20/13 2122 08/20/13 2306 08/21/13 0334  BP: 143/39 154/20 156/24 138/27  Pulse:      Temp: 98.3 F (36.8 C)  98.4 F (36.9 C) 98.8 F (37.1 C)  TempSrc: Oral  Oral Oral  Resp: 14 12 13 13   Height:      Weight:    130 lb 4.7 oz (59.1 kg)  SpO2: 97%  96% 97%    Intake/Output Summary (Last 24 hours) at 08/21/13 0754 Last data filed at 08/21/13 0700  Gross per 24 hour  Intake  781.5 ml  Output   1351 ml  Net -569.5 ml   Filed Weights   08/19/13 0730 08/20/13 0500 08/21/13 0334  Weight: 133 lb (60.328 kg) 130 lb 15.3 oz (59.4 kg) 130 lb 4.7 oz (59.1 kg)    PHYSICAL EXAM  General: Pleasant, NAD. Neuro: Alert and oriented X 3. Moves all extremities spontaneously. Psych: Normal affect. HEENT:  Normal  Neck: Supple without bruits or JVD. Lungs:  Resp regular and unlabored, CTA. Heart: RRR no s3, s4, or murmurs. Abdomen: Soft, non-tender,  non-distended, BS + x 4.  Extremities: No clubbing, cyanosis or edema. DP/PT/Radials 2+ and equal bilaterally.  Accessory Clinical Findings  CBC  Recent Labs  08/20/13 0425 08/21/13 0543  WBC 4.8 5.3  HGB 7.9* 8.1*  HCT 23.8* 24.4*  MCV 90.8 91.0  PLT 168 183   Basic Metabolic Panel  Recent Labs  08/20/13 0425 08/21/13 0543  NA 136 138  K 4.0 3.9  CL 102 103  CO2 24 24  GLUCOSE 122* 144*  BUN 48* 51*  CREATININE 2.13* 2.37*  CALCIUM 8.7 8.8    TELE  SR 48-60/min   ASSESSMENT AND PLAN  ASSESSMENT  Ms Hunkele is a 78 year old female with multiple medical problems including CKD, CAD and HFpEF admitted for acute on chronic heart failure and chest pain with ECG changes in the settings of hypertensive urgency.   1. Acute on chronic diastolic heart failure  - the patient is now at her home dry weight, we will re-start Lasix carefully after the cath  2. Resistant hypertension with wide pulse pressure - improved on current regimen, still elevated. The plan to  perform CO2 angiography with Dr Excell Seltzer today.  3. Acute on chronic kidney failure, stage IV - worsening after a high dose of Lasix, we will monitor  4. CAD - chest pain, negative Troponin, now pain free   5. Peripheral arterial disease - possible intervention once kidney function is improved   Signed, Tobias Alexander, H MD

## 2013-08-21 NOTE — Progress Notes (Signed)
 Patient Name: Haleigh B Heacock Date of Encounter: 08/21/2013     Principal Problem:   Acute diastolic heart failure Active Problems:   ANEMIA OF CHRONIC DISEASE   HYPERTENSION   Chronic diastolic heart failure   PAD (peripheral artery disease)   Type 2 diabetes mellitus   Chronic kidney disease, stage III (moderate)   AKI (acute kidney injury)   Peripheral arterial disease   Celiac artery stenosis   Mesenteric artery stenosis    SUBJECTIVE  The patient had a good night, breathing is significantly improved.    CURRENT MEDS . amLODipine  10 mg Oral Daily  . aspirin EC  81 mg Oral Daily  . atorvastatin  20 mg Oral q1800  . carvedilol  12.5 mg Oral BID WC  . cloNIDine  0.1 mg Oral BID  . insulin aspart  0-9 Units Subcutaneous TID WC  . insulin glargine  20 Units Subcutaneous QHS  . isosorbide-hydrALAZINE  1 tablet Oral TID  . levothyroxine  75 mcg Oral QAC breakfast  . pantoprazole  40 mg Oral Daily  . sodium chloride  3 mL Intravenous Q12H  . traMADol  100 mg Oral BID    OBJECTIVE  Filed Vitals:   08/20/13 1934 08/20/13 2122 08/20/13 2306 08/21/13 0334  BP: 143/39 154/20 156/24 138/27  Pulse:      Temp: 98.3 F (36.8 C)  98.4 F (36.9 C) 98.8 F (37.1 C)  TempSrc: Oral  Oral Oral  Resp: 14 12 13 13  Height:      Weight:    130 lb 4.7 oz (59.1 kg)  SpO2: 97%  96% 97%    Intake/Output Summary (Last 24 hours) at 08/21/13 0754 Last data filed at 08/21/13 0700  Gross per 24 hour  Intake  781.5 ml  Output   1351 ml  Net -569.5 ml   Filed Weights   08/19/13 0730 08/20/13 0500 08/21/13 0334  Weight: 133 lb (60.328 kg) 130 lb 15.3 oz (59.4 kg) 130 lb 4.7 oz (59.1 kg)    PHYSICAL EXAM  General: Pleasant, NAD. Neuro: Alert and oriented X 3. Moves all extremities spontaneously. Psych: Normal affect. HEENT:  Normal  Neck: Supple without bruits or JVD. Lungs:  Resp regular and unlabored, CTA. Heart: RRR no s3, s4, or murmurs. Abdomen: Soft, non-tender,  non-distended, BS + x 4.  Extremities: No clubbing, cyanosis or edema. DP/PT/Radials 2+ and equal bilaterally.  Accessory Clinical Findings  CBC  Recent Labs  08/20/13 0425 08/21/13 0543  WBC 4.8 5.3  HGB 7.9* 8.1*  HCT 23.8* 24.4*  MCV 90.8 91.0  PLT 168 183   Basic Metabolic Panel  Recent Labs  08/20/13 0425 08/21/13 0543  NA 136 138  K 4.0 3.9  CL 102 103  CO2 24 24  GLUCOSE 122* 144*  BUN 48* 51*  CREATININE 2.13* 2.37*  CALCIUM 8.7 8.8    TELE  SR 48-60/min   ASSESSMENT AND PLAN  ASSESSMENT  Ms Carstens is a 78 year old female with multiple medical problems including CKD, CAD and HFpEF admitted for acute on chronic heart failure and chest pain with ECG changes in the settings of hypertensive urgency.   1. Acute on chronic diastolic heart failure  - the patient is now at her home dry weight, we will re-start Lasix carefully after the cath  2. Resistant hypertension with wide pulse pressure - improved on current regimen, still elevated. The plan to  perform CO2 angiography with Dr Cooper today.     3. Acute on chronic kidney failure, stage IV - worsening after a high dose of Lasix, we will monitor  4. CAD - chest pain, negative Troponin, now pain free   5. Peripheral arterial disease - possible intervention once kidney function is improved   Signed, Ajanae Virag, H MD  

## 2013-08-21 NOTE — Interval H&P Note (Signed)
History and Physical Interval Note:  08/21/2013 3:16 PM  Nicole [name]  has presented today for surgery, with the diagnosis of renal stenosis  The various methods of treatment have been discussed with the patient and family. After consideration of risks, benefits and other options for treatment, the patient has consented to  Procedure(s): RENAL ANGIOGRAM (N/A) as a surgical intervention .  The patient's history has been reviewed, patient examined, no change in status, stable for surgery.  I have reviewed the patient's chart and labs.  Questions were answered to the patient's satisfaction.     [name]

## 2013-08-21 NOTE — Progress Notes (Signed)
[facility] Progress Note [place] TEAM 1 - Stepdown ICU Team   CATIA TODOROV WGN:[id number] DOB: [birth year] DOA: 08/12/2013 PCP: Michele Mcalpine, MD  Brief narrative: 78 year old female patient presented with one week of progressive shortness of breath and weight gain. On the date of admission this was associated with a new symptom of chest pain without radiation but didn't relieve after taking 2 sublingual and triglycerides home as well as an aspirin. She increased her usual home dose of Lasix because of increasing shortness of breath and lower extremity edema. Since increasing her home Lasix she had lost at least 7 pounds and her shortness of breath had markedly improved and her edema had resolved. Again on the day of admission she began having chest pain so she presented to the emergency department. By the time the admitting physician evaluated her her chest pain had relieved.  Assessment/Plan:  Acute diastolic heart failure / Chronic diastolic heart failure,  - Dry weight is 57. Kg. Followed by Dr. Delton See.  - Has been treated with IV Lasix this admission - Cont. Coreg and bidil  - NTG drip has been dc'd in favor of isosorbide (Bidil) - Avoid ACEI in setting of AKI   HYPERTENSION,  - SBP elevated, but diastolic low, mildly bradycardic. No AI. Per cardiology, diastolic may be inaccurate due to PAD. - BP management per cardiology  - Continue bidil TID, carvedilol 12.5mg  BID and Catapres - Continue norvasc 10 mg daily   Acute on Chronic kidney disease III/IV:  - Baseline Cr 1.3-1.5. Kidney function improved with diuresis, likely cardiorenal syndrome- on IVF pre cath.  - Renal US: No obstruction  - Renal artery duplex: Abnormal waveforms suggestive of distal or intrarenal obstruction. Follow up official report.  - FENa: Prerenal but ? Accuracy in setting of diuretics - Renally dose medications and minimize nephrotoxins  - Appreciate Nephrology recommendations   Renal artery  stenosis:  - Dr. Gala Romney recommended CO2 arteriography with plans to pursue 9/15 - on IVF pre procedure  PAD (peripheral artery disease):  - > 50% stenosis/moderate PAD on [place].  - ASA  Diarrhea - resolved  ANEMIA OF CHRONIC DISEASE/GFR < 50 (renal parenchymal disease)  - Iron studies: Possible iron deficiency  - Hgb stable at 8.1 - FOB pending - TSH, B12 wnl  - Folate elevated   Type 2 diabetes mellitus:  - Glucose stable.  - Continue current Lantus and SSI.   Hyponatremia  - due to CHF.  - Continue diuresis.   DVT prophylaxis: SCDs Code Status: Full Family Communication: Patient Disposition Plan/Expected LOS: Transfer to telemetry  Consultants: Cardiology Nephrology  Procedures: TTE - Left ventricle: The cavity size was normal. Systolic function was vigorous. The estimated ejection fraction was in the range of 65% to 70%. Wall motion was normal; there were no regional wall motion abnormalities. Features are consistent with a pseudonormal left ventricular filling pattern, with concomitant abnormal relaxation and increased filling pressure (grade 2 diastolic dysfunction). - Mitral valve: Calcified annulus. Mild regurgitation. - Left atrium: The atrium was mildly dilated. - Right atrium: The atrium was mildly dilated.  Lower Extremity Arterial Duplex Findings consistent with >50% stenosis of the right common femoral artery into the right profunda femoral and femoral arteries. There is somecollateral flow noted. Bilateral ABIs are suggestive of moderate arterial insufficiency.  Left Lower Extremity Venous Duplex  No evidence of deep vein thrombosis involving the left lower extremity.  Renal Artery Duplex Normal renal artery velocities. However, based on atypical waveforms  and abnormal intrarenal indices, this is suggestive of distal obstruction/ occlusion bilaterally.   Antibiotics: None  HPI/Subjective: Alert and states is feeling better than  previous. Currently no complaints of chest pain, shortness of breath, orthopnea.  Objective: Blood pressure 170/27, pulse 54, temperature 98.1 F (36.7 C), temperature source Oral, resp. rate 13, height 5\' 4"  (1.626 m), weight 59.1 kg (130 lb 4.7 oz), SpO2 95.00%.  Intake/Output Summary (Last 24 hours) at 08/21/13 1351 Last data filed at 08/21/13 0800  Gross per 24 hour  Intake  382.5 ml  Output    800 ml  Net -417.5 ml   Exam: General: No acute respiratory distress Lungs: Clear to auscultation bilaterally without wheezes or crackles, RA Cardiovascular: Regular rate and occasionally bradycardic rhythm without murmur gallop or rub normal S1 and S2, no peripheral edema or JVD Abdomen: Nontender, nondistended, soft, bowel sounds positive, no rebound, no ascites, no appreciable mass Musculoskeletal: No significant cyanosis, clubbing of bilateral lower extremities Neurological: Alert and oriented x 3, moves all extremities x 4 without focal neurological deficits, CN 2-12 intact  Scheduled Meds:  Scheduled Meds: . amLODipine  10 mg Oral Daily  . aspirin EC  81 mg Oral Daily  . atorvastatin  20 mg Oral q1800  . carvedilol  12.5 mg Oral BID WC  . cloNIDine  0.1 mg Oral BID  . insulin aspart  0-9 Units Subcutaneous TID WC  . insulin glargine  20 Units Subcutaneous QHS  . isosorbide-hydrALAZINE  1 tablet Oral TID  . levothyroxine  75 mcg Oral QAC breakfast  . pantoprazole  40 mg Oral Daily  . sodium chloride  3 mL Intravenous Q12H  . traMADol  100 mg Oral BID   Data Reviewed: Basic Metabolic Panel:  Recent Labs Lab 08/17/13 0707 08/18/13 0620 08/19/13 0518 08/20/13 0425 08/21/13 0543  NA 133* 136 134* 136 138  K 4.9 4.5 4.0 4.0 3.9  CL 102 104 101 102 103  CO2 21 22 23 24 24   GLUCOSE 129* 203* 97 122* 144*  BUN 47* 49* 46* 48* 51*  CREATININE 2.00* 2.01* 1.96* 2.13* 2.37*  CALCIUM 8.3* 8.5 8.7 8.7 8.8   Liver Function Tests:  Recent Labs Lab 08/16/13 0430  08/17/13 0707 08/18/13 0620  AST 15 15 14   ALT 9 10 11   ALKPHOS 98 100 104  BILITOT 0.2* 0.3 0.3  PROT 5.1* 5.1* 5.2*  ALBUMIN 2.1* 2.1* 2.1*   CBC:  Recent Labs Lab 08/16/13 1335 08/19/13 0518 08/20/13 0425 08/21/13 0543  WBC 6.6 4.6 4.8 5.3  HGB 8.6* 8.0* 7.9* 8.1*  HCT 25.4* 24.0* 23.8* 24.4*  MCV 89.4 90.2 90.8 91.0  PLT 243 172 168 183   BNP (last 3 results)  Recent Labs  02/09/13 1230 02/13/13 1145 02/14/13 0540  PROBNP 2537.0* 2031.0* 1703.0*   CBG:  Recent Labs Lab 08/20/13 1127 08/20/13 1638 08/20/13 2131 08/21/13 0805 08/21/13 1225  GLUCAP 196* 203* 212* 126* 93    Studies:  Recent x-ray studies have been reviewed in detail by the Attending Physician  Junious Silk, ANP [facility] Office  [phone] Pager [phone]  **If unable to reach the above provider after paging please contact the Flow Manager @ [phone]  On-Call/Text Page:      [email or web address]      password [email or web address]  If 7PM-7AM, please contact night-coverage [email or web address] Password [email or web address] 08/21/2013, 1:51 PM   LOS: 9 days   I have personally examined this patient and reviewed the entire database. I have reviewed  the above note, made any necessary editorial changes, and agree with its content.  Lonia Blood, MD [facility]

## 2013-08-22 ENCOUNTER — Telehealth: Payer: Self-pay | Admitting: Cardiology

## 2013-08-22 LAB — GLUCOSE, CAPILLARY: Glucose-Capillary: 159 mg/dL — ABNORMAL HIGH (ref 70–99)

## 2013-08-22 LAB — BASIC METABOLIC PANEL
BUN: 46 mg/dL — ABNORMAL HIGH (ref 6–23)
CO2: 24 mEq/L (ref 19–32)
Calcium: 8.8 mg/dL (ref 8.4–10.5)
Creatinine, Ser: 2.29 mg/dL — ABNORMAL HIGH (ref 0.50–1.10)
GFR calc non Af Amer: 20 mL/min — ABNORMAL LOW (ref >90)
Glucose, Bld: 87 mg/dL (ref 70–99)

## 2013-08-22 MED ORDER — FUROSEMIDE 80 MG PO TABS: 80.0000 mg | ORAL_TABLET | Freq: Two times a day (BID) | ORAL | Status: DC

## 2013-08-22 MED ORDER — ISOSORB DINITRATE-HYDRALAZINE 20-37.5 MG PO TABS: 1.0000 | ORAL_TABLET | Freq: Three times a day (TID) | ORAL | Status: DC

## 2013-08-22 MED ORDER — CALCITRIOL 0.25 MCG PO CAPS
0.2500 ug | ORAL_CAPSULE | Freq: Every day | ORAL | Status: DC
  Administered 2013-08-22: 0.25 ug via ORAL
  Filled 2013-08-22: qty 1

## 2013-08-22 MED ORDER — CARVEDILOL 12.5 MG PO TABS: 12.5000 mg | ORAL_TABLET | Freq: Two times a day (BID) | ORAL | Status: DC

## 2013-08-22 MED ORDER — SPIRONOLACTONE 25 MG PO TABS
25.0000 mg | ORAL_TABLET | Freq: Every day | ORAL | Status: DC
  Administered 2013-08-22: 10:00:00 25 mg via ORAL
  Filled 2013-08-22: qty 1

## 2013-08-22 MED ORDER — CLONIDINE HCL 0.1 MG PO TABS: 0.1000 mg | ORAL_TABLET | Freq: Two times a day (BID) | ORAL | Status: DC

## 2013-08-22 MED ORDER — ATORVASTATIN CALCIUM 20 MG PO TABS: 20.0000 mg | ORAL_TABLET | Freq: Every day | ORAL | Status: DC

## 2013-08-22 MED ORDER — FUROSEMIDE 80 MG PO TABS
80.0000 mg | ORAL_TABLET | Freq: Two times a day (BID) | ORAL | Status: DC
  Administered 2013-08-22: 80 mg via ORAL
  Filled 2013-08-22 (×2): qty 1

## 2013-08-22 MED ORDER — CALCITRIOL 0.25 MCG PO CAPS: 0.2500 ug | ORAL_CAPSULE | Freq: Every day | ORAL | Status: DC

## 2013-08-22 MED ORDER — AMLODIPINE BESYLATE 10 MG PO TABS: 10.0000 mg | ORAL_TABLET | Freq: Every day | ORAL | Status: DC

## 2013-08-22 NOTE — Progress Notes (Signed)
Patient Name: Nicole [name] Date of Encounter: 08/22/2013     Principal Problem:   Acute diastolic heart failure Active Problems:   ANEMIA OF CHRONIC DISEASE   HYPERTENSION   Chronic diastolic heart failure   PAD (peripheral artery disease)   Type 2 diabetes mellitus   Chronic kidney disease, stage III (moderate)   AKI (acute kidney injury)   Peripheral arterial disease   Celiac artery stenosis   Mesenteric artery stenosis    SUBJECTIVE  The patient feels overall better, she was able to give herself a bath this morning.   CURRENT MEDS . amLODipine  10 mg Oral Daily  . aspirin EC  81 mg Oral Daily  . atorvastatin  20 mg Oral q1800  . carvedilol  12.5 mg Oral BID WC  . cloNIDine  0.1 mg Oral BID  . furosemide  80 mg Oral BID  . insulin aspart  0-9 Units Subcutaneous TID WC  . insulin glargine  20 Units Subcutaneous QHS  . isosorbide-hydrALAZINE  1 tablet Oral TID  . levothyroxine  75 mcg Oral QAC breakfast  . pantoprazole  40 mg Oral Daily  . sodium chloride  3 mL Intravenous Q12H  . spironolactone  25 mg Oral Daily  . traMADol  100 mg Oral BID    OBJECTIVE  Filed Vitals:   08/21/13 2000 08/21/13 2100 08/22/13 0100 08/22/13 0618  BP:  148/46 138/30 137/67  Pulse:  52 50 48  Temp: 98.9 F (37.2 C) 97.9 F (36.6 C) 97.9 F (36.6 C) 98.1 F (36.7 C)  TempSrc: Oral Oral Oral Oral  Resp:  16 17 16   Height:  5\' 4"  (1.626 m)    Weight:  131 lb 13.4 oz (59.8 kg)  130 lb 11.7 oz (59.3 kg)  SpO2:  94% 94% 92%    Intake/Output Summary (Last 24 hours) at 08/22/13 1116 Last data filed at 08/22/13 0828  Gross per 24 hour  Intake    600 ml  Output    350 ml  Net    250 ml   Filed Weights   08/21/13 0334 08/21/13 2100 08/22/13 0618  Weight: 130 lb 4.7 oz (59.1 kg) 131 lb 13.4 oz (59.8 kg) 130 lb 11.7 oz (59.3 kg)    PHYSICAL EXAM  General: Pleasant, NAD. Neuro: Alert and oriented X 3. Moves all extremities spontaneously. Psych: Normal affect. HEENT:   Normal  Neck: Supple without bruits or JVD. Lungs:  Resp regular and unlabored, minimal rales at the basis Heart: RRR no s3, s4, or murmurs. Abdomen: Soft, non-tender, non-distended, BS + x 4.  Extremities: No clubbing, cyanosis or edema. DP/PT/Radials 2+ and equal bilaterally.  Accessory Clinical Findings  CBC  Recent Labs  08/20/13 0425 08/21/13 0543  WBC 4.8 5.3  HGB 7.9* 8.1*  HCT 23.8* 24.4*  MCV 90.8 91.0  PLT 168 183   Basic Metabolic Panel  Recent Labs  08/21/13 0543 08/22/13 0750  NA 138 140  K 3.9 3.9  CL 103 104  CO2 24 24  GLUCOSE 144* 87  BUN 51* 46*  CREATININE 2.37* 2.29*  CALCIUM 8.8 8.8    TELE  SR 48-60/min  Procedure: CO2 Renal Angiography  Indication: malignant HTN, CKD  Procedural details: The right groin was prepped, draped, and anesthetized with 1% lidocaine. Using modified Seldinger technique, a 5 French sheath was introduced into the right femoral artery. A pigtail catheter was advanced into the suprarenal aorta and CO2 angiography was performed under digital subtraction  angiography in multiple planes. There were no immediate complications.  Procedural Findings:  The abdominal aorta is patent. There are single renal arteries bilaterally. The aorta and proximal renal arteries are heavily calcified but widely patent bilaterally.  Final Conclusions: Patent renal arteries bilaterally.     ASSESSMENT AND PLAN  Ms Claycomb is a 78 year old female with multiple medical problems including CKD, CAD and HFpEF admitted for acute on chronic heart failure and chest pain with ECG changes in the settings of hypertensive urgency.   1. Acute on chronic diastolic heart failure  - the patient is now at her home dry weight, she is ready to go home - continue Lasix BID at 8 am and 2 pm - follow up in the [facility] cardiology clinic early next week with BMP  2. Resistant hypertension with wide pulse pressure - improved on current regimen, still elevated. CO2  angiography didn't show any stenosis amenable to intervention  - medical management  - home meds: Amlodipine 10 mg QD Coreg 12.5 mg BID Clonidine 0.1 mg BID BiDil 20-37.5 mg Q8H - hold ACEI  3. Acute on chronic kidney failure, stage IV - worsening, she will possibly need dialysis in the future, we will monitor for now  4. CAD - chest pain, negative Troponin, now pain free - medical management  5. Peripheral arterial disease - B/L, not significant claudications, possible intervention once kidney function is improved   Signed, Tobias Alexander, H MD

## 2013-08-22 NOTE — Progress Notes (Signed)
Patient accepted a transition of care call only after her last admission.  After a bedside discussion today she realized that she needs more monitoring.  She understands that she is not homebound and would like to be engaged by [facility] for disease process education and monitoring.  Patient also requested more assistance with managing her medications.  She will receive a transition of care call post discharge.  Made inpatient RNCM aware.  Of note, [facility] Care Management services does not replace or interfere with any services that are arranged by inpatient case management or social work.  For additional questions or referrals please contact Anibal Henderson BSN RN [facility] Liaison at [phone].

## 2013-08-22 NOTE — Progress Notes (Signed)
S: CO feeling weak O:BP 137/67  Pulse 48  Temp(Src) 98.1 F (36.7 C) (Oral)  Resp 16  Ht 5\' 4"  (1.626 m)  Wt 59.3 kg (130 lb 11.7 oz)  BMI 22.43 kg/m2  SpO2 92%  Intake/Output Summary (Last 24 hours) at 08/22/13 0840 Last data filed at 08/22/13 3086  Gross per 24 hour  Intake    600 ml  Output    350 ml  Net    250 ml   Weight change: 0.7 kg (1 lb 8.7 oz) VHQ:IONGE and alert XBM:[id number] 2/6 systolic M LSB Resp: clear Abd:+ BS NTND Ext:0-tr edema NEURO:OX3 CNI No asterixis   . amLODipine  10 mg Oral Daily  . aspirin EC  81 mg Oral Daily  . atorvastatin  20 mg Oral q1800  . carvedilol  12.5 mg Oral BID WC  . cloNIDine  0.1 mg Oral BID  . furosemide  80 mg Oral BID  . insulin aspart  0-9 Units Subcutaneous TID WC  . insulin glargine  20 Units Subcutaneous QHS  . isosorbide-hydrALAZINE  1 tablet Oral TID  . levothyroxine  75 mcg Oral QAC breakfast  . pantoprazole  40 mg Oral Daily  . sodium chloride  3 mL Intravenous Q12H  . spironolactone  25 mg Oral Daily  . traMADol  100 mg Oral BID   No results found. BMET    Component Value Date/Time   NA 138 08/21/2013 0543   K 3.9 08/21/2013 0543   CL 103 08/21/2013 0543   CO2 24 08/21/2013 0543   GLUCOSE 144* 08/21/2013 0543   BUN 51* 08/21/2013 0543   CREATININE 2.37* 08/21/2013 0543   CALCIUM 8.8 08/21/2013 0543   GFRNONAA 19* 08/21/2013 0543   GFRAA 22* 08/21/2013 0543   CBC    Component Value Date/Time   WBC 5.3 08/21/2013 0543   RBC 2.68* 08/21/2013 0543   RBC 3.48* 03/20/2011 1715   HGB 8.1* 08/21/2013 0543   HCT 24.4* 08/21/2013 0543   PLT 183 08/21/2013 0543   MCV 91.0 08/21/2013 0543   MCH 30.2 08/21/2013 0543   MCHC 33.2 08/21/2013 0543   RDW 13.8 08/21/2013 0543   LYMPHSABS 1.4 04/27/2013 0751   MONOABS 0.5 04/27/2013 0751   EOSABS 0.6 04/27/2013 0751   BASOSABS 0.0 04/27/2013 0751     Assessment: 1. Acute on CKD 3, (baseline Scr 1.5-1.9), renal fx unchanged 2. HTN, no evidence of RAS on CO2 angio 3.  Diastolic heart failure 4. Sec HPTH 5. Bradycardia, suspect this may be contributing to her weakness  Plan: 1. Await labs 2. I think the risk of hyperkalemia outweighs any benefit and thus would not recommend it 3. Start calcitriol for sec HPTH 4. I would decrease or stop carvedilol as I think a pulse rate in the 40's could effect renal perfusion and function   Nicole [name]

## 2013-08-22 NOTE — Progress Notes (Signed)
Physical Therapy Treatment Patient Details Name: APARNA VANDERWEELE MRN: [id number] DOB: [birth year] Today's Date: 08/22/2013 Time: 1000-1015 PT Time Calculation (min): 15 min  PT Assessment / Plan / Recommendation  History of Present Illness 78 yo female h/o dm, htn, diastolic chf comes in with one week of worsening wt gain, and sob.  Today she started having sscp no radiation which was relieved after taking 2 ntg at home and asa.  She is normally on lasix 80 mg daily.  She increased her home dose of lasix several days ago due to increasing sob, and lower ext edema.  Since increasing her home lasix she has lost 7 lbs and her sob has markedly improved and her le edema has resolved.  However today when she started with cp she got concerned and came to ED.  She is cp free now.  No fevers.  No cough.  She has also had mildly increased worsening of her renal funcition.  uop is normal   PT Comments   Pt states she feels a little more weak today as she hasn't been up ambulating since last PT session.    Follow Up Recommendations  No PT follow up;Supervision - Intermittent     Does the patient have the potential to tolerate intense rehabilitation     Barriers to Discharge        Equipment Recommendations       Recommendations for Other Services    Frequency Min 3X/week   Progress towards PT Goals Progress towards PT goals: Progressing toward goals  Plan Current plan remains appropriate    Precautions / Restrictions Precautions Precautions: Fall Restrictions Weight Bearing Restrictions: No   Pertinent Vitals/Pain States Lt hip feels tight    Mobility  Bed Mobility Bed Mobility: Supine to Sit;Sitting - Scoot to Edge of Bed Supine to Sit: 6: Modified independent (Device/Increase time) Sitting - Scoot to Edge of Bed: 6: Modified independent (Device/Increase time) Transfers Transfers: Sit to Stand;Stand to Sit Sit to Stand: 5: Supervision;With upper extremity assist;From bed Stand to  Sit: 5: Supervision;With upper extremity assist;With armrests;To chair/3-in-1 Details for Transfer Assistance: cues to reinforce safest hand placement Ambulation/Gait Ambulation/Gait Assistance: 4: Min guard;5: Supervision Ambulation Distance (Feet): 200 Feet Assistive device: Rolling walker;Straight cane Ambulation/Gait Assistance Details: Min Guard for amb with SPC due to pt c/o Lt hip pain & LE weakness.  Transitioned to RW as distance increased & pt with increased fluidity of gait & stability.  Pt states she has a RW at home & agreeable use RW initially at home.  Gait Pattern: Step-through pattern;Decreased stride length;Decreased step length - left;Antalgic General Gait Details: Pt states LLE is shorter than RLE & that LLE becomes fatigued/weak with increased distance.  Stairs: Yes Stairs Assistance: 4: Min guard Stairs Assistance Details (indicate cue type and reason): cane + Rt rail.  Guarding for safety Stair Management Technique: One rail Right;Forwards;With cane;Step to pattern Number of Stairs: 3 Wheelchair Mobility Wheelchair Mobility: No      PT Goals (current goals can now be found in the care plan section) Acute Rehab PT Goals PT Goal Formulation: With patient Time For Goal Achievement: 08/21/13 Potential to Achieve Goals: Good  Visit Information  Last PT Received On: 08/22/13 Assistance Needed: +1 History of Present Illness: 78 yo female h/o dm, htn, diastolic chf comes in with one week of worsening wt gain, and sob.  Today she started having sscp no radiation which was relieved after taking 2 ntg at home and asa.  She is normally on lasix 80 mg daily.  She increased her home dose of lasix several days ago due to increasing sob, and lower ext edema.  Since increasing her home lasix she has lost 7 lbs and her sob has markedly improved and her le edema has resolved.  However today when she started with cp she got concerned and came to ED.  She is cp free now.  No fevers.  No  cough.  She has also had mildly increased worsening of her renal funcition.  uop is normal    Subjective Data      Cognition  Cognition Arousal/Alertness: Awake/alert Behavior During Therapy: WFL for tasks assessed/performed Overall Cognitive Status: Within Functional Limits for tasks assessed    Balance     End of Session PT - End of Session Equipment Utilized During Treatment: Gait belt Activity Tolerance: Patient tolerated treatment well Patient left: in chair;with call bell/phone within reach Nurse Communication: Mobility status   GP     Lara Mulch 08/22/2013, 1:07 PM  Verdell Face, PTA [phone] 08/22/2013

## 2013-08-22 NOTE — Progress Notes (Signed)
Patient stated that she was feeling much better since eating her dinner.  Patient stated that her vision is no longer blurry and she had already called her daughter to come pick her up.  MD made aware and stated it was okay to go ahead with d/c since patient is now stating that he vision is no longer blurry.  Patient was discharged home with son and daughter in law.  Discharge instructions and medications reviewed with patient and sent home.  Patient was taken to the [place] tower for pick-up via wheelchair.

## 2013-08-22 NOTE — Progress Notes (Signed)
Pt is alert and oriented x4. Pt was complaining of some blurry vision this AM. Dr. Malachi Bonds is aware. Pt has no complaints of pain. Will continue to monitor

## 2013-08-22 NOTE — Telephone Encounter (Signed)
New problem     14 days TCM with Dr. Delton See on  9/25 @ 10:30

## 2013-08-22 NOTE — Progress Notes (Signed)
Patient states that she feels better and her blurry vision has resolved.  She would like to go home.

## 2013-08-22 NOTE — Progress Notes (Signed)
Patient complaining of persistent blurry vision in the left eye and generally feeling unwell.  MRI brain to rule out stroke.  Repeat labs in AM.

## 2013-08-22 NOTE — Discharge Summary (Signed)
Physician Discharge Summary  Nicole [name]:[id number] DOB: [birth year] DOA: 08/12/2013  PCP: Michele Mcalpine, MD  Admit date: 08/12/2013 Discharge date: 08/22/2013  Recommendations for Outpatient Follow-up:  1. Follow up with cardiology within one week of discharge 2. Followup with nephrology in 2 weeks 3. Bidil, norvasc, clonidine started 4. Pravastatin changed to atorvastatin 5. Stopped ACEI/ARBs 6. Increased lasix and carvedilol doses 7. Started calcitriol  Discharge Diagnoses:  Principal Problem:   Acute diastolic heart failure Active Problems:   ANEMIA OF CHRONIC DISEASE   HYPERTENSION   Chronic diastolic heart failure   PAD (peripheral artery disease)   Type 2 diabetes mellitus   Chronic kidney disease, stage III (moderate)   AKI (acute kidney injury)   Peripheral arterial disease   Celiac artery stenosis   Mesenteric artery stenosis   Discharge Condition: Stable, improved  Diet recommendation: Healthy heart, diabetic  Wt Readings from Last 3 Encounters:  08/22/13 59.3 kg (130 lb 11.7 oz)  08/22/13 59.3 kg (130 lb 11.7 oz)  06/07/13 61.236 kg (135 lb)    History of present illness:  78 yo female h/o dm, htn, diastolic chf comes in with one week of worsening wt gain, and sob. Today she started having sscp no radiation which was relieved after taking 2 ntg at home and asa. She is normally on lasix 80 mg daily. She increased her home dose of lasix several days ago due to increasing sob, and lower ext edema. Since increasing her home lasix she has lost 7 lbs and her sob has markedly improved and her le edema has resolved. However today when she started with cp she got concerned and came to ED. She is cp free now. No fevers. No cough. She has also had mildly increased worsening of her renal funcition. uop is normal.  Hospital Course:   Acute on chronic diastolic heart failure. Her dry weight is 57 kg and she is followed as an outpatient by Doctor Delton See. She was  admitted with some shortness of breath and weight gain. She was initially started on diuresis with IV Lasix by cardiology, however she had an increase in her BUN and creatinine. Her diuresis was discontinued. Her fractional excretion of sodium demonstrated prerenal etiology.  She was given some gentle IV fluids but immediately became Amando Ishikawa of breath. IV fluids were discontinued and it was felt that her acute on chronic kidney disease was secondary to cardio renal syndrome. She is restarted on Lasix 80 mg IV twice a day and her weight slowly trended down. She diuresis approximately 1 L daily and her net fluid balance was -6 L at the time of discharge. She continued her beta blocker and hydralazine. She was started on a nitroglycerin drip. She was not started on ACE inhibitor secondary to her acute kidney injury. Her weight on the day of discharge is 59 kg, however she was feeling markedly improved.  Hypertension with wide pulse pressure and very low diastolic blood pressures to the 20s and 30s. She did not have aortic insufficiency. Her by pulse pressure is likely secondary to her severe peripheral arterial disease. Her blood pressure medications were managed by cardiology. She continued to bidil, carvedilol, clonidine, Norvasc. Her heart rate trended in the 40s. She denied lightheadedness with ambulation or sitting up. She did not have chest pain despite her low diastolic blood pressures.  Acute on chronic kidney disease stage III/4. Her baseline creatinine it was previously 1.3-1.5 earlier this year. She may have had some acute progression of  her kidney disease or a slow progression of the last several months. Original ultrasound demonstrated no obvious obstruction, and possible medical renal disease. Her fractional excretion of sodium demonstrated prerenal etiology. 2 diuresis and had stabilization of her BUN and creatinine. She underwent a renal artery duplex because of her severe peripheral vascular disease.  It demonstrated normal velocities but abnormal waveforms suggestive of distal or intrarenal obstruction. This is consistent with chronic kidney disease. Nephrology was consult. Cardiology was suspicious of renal artery stenosis despite the findings on the renal artery duplex and she underwent renal angiography on September 15. There is no evidence of renal artery stenosis. Her BUN was 46 and creatinine 2.29 and the day of discharge. She should followup with [facility] it with a moment of discharge. She has secondary hyperparathyroidism due to her kidney disease and was started on calcitriol.  Peripheral arterial disease. She has greater than 50% stenosis and moderate peripheral arterial disease of the right common femoral artery into the right profundus femoral and femoral arteries. There was collateral flow noted. Her bilateral ABIs were suggestive of moderate arterial insufficiency. Her lower extremity venous duplex was negative for DVT.   Her renal artery duplex demonstrated evidence of celiac and superior mesenteric artery stenosis. She continued aspirin. She should followup with interventional cardiology if her renal function it improves. At this time she has stage IV chronic kidney disease and is at high risk of requiring dialysis should she have further kidney injury. She is okay that with her physicians and her family and is consistent in her desire to not undergo hemodialysis.  She had some mild diarrhea which resolved spontaneously.  Anemia of chronic disease with a prominent component of renal parenchymal disease. Her iron studies suggested anemia of chronic disease. Her TSH and B12 were within normal limits.  Type 2 diabetes mellitus. Her glucose are main stable. To continue Lantus and sliding scale insulin.  Hyponatremia was likely due to congestive heart failure and she underwent diuresis.    Consultants:  Cardiology, Dr. Delton See  Nephrology, Dr. Allena Katz Procedures:   Echo  lower extremity venous duplex  Lower extremity arterial duplex  CO2 renal angiography on September 15  Antibiotics:  None   Discharge Exam: Filed Vitals:   08/22/13 1646  BP: 134/60  Pulse: 48  Temp:   Resp:    Filed Vitals:   08/22/13 0100 08/22/13 0618 08/22/13 1411 08/22/13 1646  BP: 138/30 137/67 131/64 134/60  Pulse: 50 48  48  Temp: 97.9 F (36.6 C) 98.1 F (36.7 C) 97.6 F (36.4 C)   TempSrc: Oral Oral Oral   Resp: 17 16 16    Height:      Weight:  59.3 kg (130 lb 11.7 oz)    SpO2: 94% 92% 99%    Patient states that she has some mild blurry vision of the left eye.    General: Alert, awake, oriented x3, in no acute distress, sitting up in bed HEENT: No bruits, no goiter.  Heart: Regular rate and rhythm, 1/6 apical murmur, no rubs  Lungs:  CTAB, no wheezes or rhonchi, no increased WOB  Abdomen: Soft, nontender, nondistended, positive bowel sounds.  MSK:  Bilateral lower extremity woody edema that is much softer than before.    Discharge Instructions      Discharge Orders   Future Appointments Provider Department Dept Phone   08/31/2013 10:30 AM Lars Masson, MD [facility] Main Office [phone]   10/10/2013 10:15 AM Rollene Rotunda, MD  [facility] Main Office [place]) [phone]   Future Orders Complete By Expires   (HEART FAILURE PATIENTS) Call MD:  Anytime you have any of the following symptoms: 1) 3 pound weight gain in 24 hours or 5 pounds in 1 week 2) shortness of breath, with or without a dry hacking cough 3) swelling in the hands, feet or stomach 4) if you have to sleep on extra pillows at night in order to breathe.  As directed    Call MD for:  difficulty breathing, headache or visual disturbances  As directed    Call MD for:  extreme fatigue  As directed    Call MD for:  hives  As directed    Call MD for:  persistant dizziness or light-headedness  As directed    Call MD for:  persistant nausea and vomiting  As  directed    Call MD for:  redness, tenderness, or signs of infection (pain, swelling, redness, odor or green/yellow discharge around incision site)  As directed    Call MD for:  severe uncontrolled pain  As directed    Call MD for:  temperature >100.4  As directed    Diet - low sodium heart healthy  As directed    Diet Carb Modified  As directed    Discharge instructions  As directed    Comments:     You were hospitalized with shortness of breath due to heart failure.  You were diuresed with lasix and are negative 6L and you weigh 130-lbs at the time of discharge.  You had high blood pressure and were started on several medications including norvasc, clonidine, and bidil.  Your carvedilol dose was increased.  Your cholesterol medication was changed to a more potent medication because you were found to have peripheral vascular disease.  Please stop your pravastatin and take the more potent atorvastatin instead.  Please increase your lasix to 80mg  twice daily and weigh yourself daily.  If you gain more than 3-lbs in one day or 5-lbs in a week, call your cardiologist.  Please follow up in cardiology clinic next week at your already scheduled appointment.  You have kidney disease and were seen by the nephrologists during this admission.  They started a new medication called calcitriol which is a special vitamin D for people with kidney disease.  Please avoid naprosyn, aleve, ibuprofen, motrin, headache powders as these can worsen kidney function.  Tylenol is okay.   Increase activity slowly  As directed        Medication List    STOP taking these medications       pravastatin 80 MG tablet  Commonly known as:  PRAVACHOL      TAKE these medications       acetaminophen 500 MG tablet  Commonly known as:  TYLENOL  Take 500 mg by mouth every 6 (six) hours as needed for pain.     amLODipine 10 MG tablet  Commonly known as:  NORVASC  Take 1 tablet (10 mg total) by mouth daily.     aspirin EC 81 MG  tablet  Take 81 mg by mouth daily.     atorvastatin 20 MG tablet  Commonly known as:  LIPITOR  Take 1 tablet (20 mg total) by mouth daily at 6 PM.     calcitRIOL 0.25 MCG capsule  Commonly known as:  ROCALTROL  Take 1 capsule (0.25 mcg total) by mouth daily.     carvedilol 12.5 MG tablet  Commonly known as:  COREG  Take 1 tablet (12.5 mg total) by mouth 2 (two) times daily with a meal.     cloNIDine 0.1 MG tablet  Commonly known as:  CATAPRES  Take 1 tablet (0.1 mg total) by mouth 2 (two) times daily.     furosemide 80 MG tablet  Commonly known as:  LASIX  Take 1 tablet (80 mg total) by mouth 2 (two) times daily.     insulin glargine 100 UNIT/ML injection  Commonly known as:  LANTUS  Inject 0.2 mLs (20 Units total) into the skin at bedtime.     insulin lispro 100 UNIT/ML injection  Commonly known as:  HUMALOG  Inject 0-10 Units into the skin 3 (three) times daily before meals. Home sliding scale     isosorbide-hydrALAZINE 20-37.5 MG per tablet  Commonly known as:  BIDIL  Take 1 tablet by mouth 3 (three) times daily.     levothyroxine 75 MCG tablet  Commonly known as:  SYNTHROID  Take 1 tablet (75 mcg total) by mouth daily.     nitroGLYCERIN 0.4 MG SL tablet  Commonly known as:  NITROSTAT  Place 0.4 mg under the tongue every 5 (five) minutes as needed for chest pain.     pantoprazole 40 MG tablet  Commonly known as:  PROTONIX  Take 40 mg by mouth daily. 30 minutes before breakfast and dinner     traMADol 50 MG tablet  Commonly known as:  ULTRAM  Take 1 tablet (50 mg total) by mouth every 6 (six) hours as needed for pain.     Vitamin D 1000 UNITS capsule  Take 1,000 Units by mouth daily.       Follow-up Information   Follow up with Lars Masson, MD On 08/31/2013. (10:30 AM)    Specialty:  Cardiology   Contact information:   [address] [place] [address] [phone]       Follow up with [facility]. Schedule an  appointment as soon as possible for a visit in 1 month.   Contact information:   [address] [place] [address] [phone]       The results of significant diagnostics from this hospitalization (including imaging, microbiology, ancillary and laboratory) are listed below for reference.    Significant Diagnostic Studies: Dg Chest 2 View  08/12/2013   *RADIOLOGY REPORT*  Clinical Data: Adonia Porada of breath  CHEST - 2 VIEW  Comparison: 02/12/2013  Findings: Normal cardiac silhouette.  There is fine interstitial pattern with a linear markings laterally.  Breast tissue node on the left and right.  There is blunting of the costophrenic angles slightly improved compared to prior.  IMPRESSION: Small bilateral pleural effusions and interstitial edema.   Original Report Authenticated By: Genevive Bi, M.D.   US Renal  08/16/2013   *RADIOLOGY REPORT*  Clinical Data: Acute kidney injury.  History of hypertension and diabetes.  RENAL/URINARY TRACT ULTRASOUND COMPLETE  Comparison:  Ultrasound 05/23/2008  Findings:  Right Kidney:  9.2 cm in length.  The lower pole is poorly visualized.  No focal mass or hydronephrosis identified.  Left Kidney:  10.1 cm in length.  Mildly echogenic renal parenchyma.  No focal mass or hydronephrosis identified.  Bladder:  Not visualized.  The patient voided just prior to exam.  Additional findings:  Note is made of bilateral pleural effusions.  IMPRESSION:  1.  No evidence for hydronephrosis. 2.  Decompressed bladder is not evaluated. 3.  Small bilateral pleural effusions.  Original Report Authenticated By: Norva Pavlov, M.D.    Microbiology: No results found for this or any previous visit (from the past 240 hour(s)).   Labs: Basic Metabolic Panel:  Recent Labs Lab 08/18/13 0620 08/19/13 0518 08/20/13 0425 08/21/13 0543 08/22/13 0750  NA 136 134* 136 138 140  K 4.5 4.0 4.0 3.9 3.9  CL 104 101 102 103 104  CO2 22 23 24 24 24   GLUCOSE 203* 97 122* 144* 87   BUN 49* 46* 48* 51* 46*  CREATININE 2.01* 1.96* 2.13* 2.37* 2.29*  CALCIUM 8.5 8.7 8.7 8.8 8.8   Liver Function Tests:  Recent Labs Lab 08/16/13 0430 08/17/13 0707 08/18/13 0620  AST 15 15 14   ALT 9 10 11   ALKPHOS 98 100 104  BILITOT 0.2* 0.3 0.3  PROT 5.1* 5.1* 5.2*  ALBUMIN 2.1* 2.1* 2.1*   No results found for this basename: LIPASE, AMYLASE,  in the last 168 hours No results found for this basename: AMMONIA,  in the last 168 hours CBC:  Recent Labs Lab 08/16/13 1335 08/19/13 0518 08/20/13 0425 08/21/13 0543  WBC 6.6 4.6 4.8 5.3  HGB 8.6* 8.0* 7.9* 8.1*  HCT 25.4* 24.0* 23.8* 24.4*  MCV 89.4 90.2 90.8 91.0  PLT 243 172 168 183   Cardiac Enzymes: No results found for this basename: CKTOTAL, CKMB, CKMBINDEX, TROPONINI,  in the last 168 hours BNP: BNP (last 3 results)  Recent Labs  02/09/13 1230 02/13/13 1145 02/14/13 0540  PROBNP 2537.0* 2031.0* 1703.0*   CBG:  Recent Labs Lab 08/21/13 1825 08/21/13 2237 08/22/13 0621 08/22/13 1050 08/22/13 1637  GLUCAP 208* 326* 121* 138* 159*    Time coordinating discharge: 45 minutes  Signed:  Tonae Livolsi  [facility] 08/22/2013, 4:50 PM

## 2013-08-23 NOTE — Telephone Encounter (Signed)
Pt does not have new meds yet from DC from hospital. Per her pharmacy they will be ready after 5 pm today, I reviewed meds with dose times and use. She is aware of app with Dr Delton See. She was told to avoid sodium/ foods reviewed and she was told to check bp / weight daily and to call with questions. Pt verbalized understanding.

## 2013-08-23 NOTE — Telephone Encounter (Signed)
D/C from [facility] 9/16 for diastolic heart failure; f/u with Dr. Delton See 9/25 @ 10:30 Left message for patient to call back

## 2013-08-25 ENCOUNTER — Other Ambulatory Visit: Payer: Self-pay

## 2013-08-25 MED ORDER — CARVEDILOL 12.5 MG PO TABS: 12.5000 mg | ORAL_TABLET | Freq: Two times a day (BID) | ORAL | Status: DC

## 2013-08-29 ENCOUNTER — Telehealth: Payer: Self-pay | Admitting: Pulmonary Disease

## 2013-08-29 ENCOUNTER — Telehealth: Payer: Self-pay | Admitting: Cardiology

## 2013-08-29 MED ORDER — PANTOPRAZOLE SODIUM 40 MG PO TBEC: 40.0000 mg | DELAYED_RELEASE_TABLET | Freq: Every day | ORAL | Status: DC

## 2013-08-29 NOTE — Telephone Encounter (Signed)
Pt states she was in the hospital recently and a lot of her meds were changed. Pt states that she is having a reaction to some of the meds. PT states she is experiencing GI upset, dizziness, tired, and pt states her voice is horse... Please advise

## 2013-08-29 NOTE — Telephone Encounter (Signed)
Spoke with patient-- Patient requesting 90 day supply of protonix sent to [facility]. Rx has been sent and nothing further needed at this time.

## 2013-08-29 NOTE — Telephone Encounter (Signed)
Returned call to patient she stated since her discharge from hospital she has been having blurred vision,dizziness,nausea.Stated she thinks new medication is causing symptoms.B/P ranging 166/56,151/51,136/40 pulse 53.Patient has appointment with Dr.Nelson 08/31/13 but thought Dr.Nelson might could make some changes in medication.Message sent to Dr.Nelson for advice.

## 2013-08-30 ENCOUNTER — Telehealth: Payer: Self-pay

## 2013-08-30 NOTE — Telephone Encounter (Signed)
Spoke to Dr.Nelson she received my message 08/29/13.She advised to have patient stop clonidine.Advised to keep appointment with Dr.Nelson 08/31/13.

## 2013-08-31 ENCOUNTER — Encounter: Payer: Self-pay | Admitting: Cardiology

## 2013-08-31 ENCOUNTER — Ambulatory Visit (INDEPENDENT_AMBULATORY_CARE_PROVIDER_SITE_OTHER): Payer: Medicare Other | Admitting: Cardiology

## 2013-08-31 VITALS — BP 150/74 | HR 52 | Wt 128.0 lb

## 2013-08-31 DIAGNOSIS — I509 Heart failure, unspecified: Secondary | ICD-10-CM

## 2013-08-31 LAB — CBC WITH DIFFERENTIAL/PLATELET
Basophils Absolute: 0 10*3/uL (ref 0.0–0.1)
Basophils Relative: 0.5 % (ref 0.0–3.0)
Eosinophils Absolute: 0.2 10*3/uL (ref 0.0–0.7)
Eosinophils Relative: 3.1 % (ref 0.0–5.0)
HCT: 28.1 % — ABNORMAL LOW (ref 36.0–46.0)
Hemoglobin: 9.5 g/dL — ABNORMAL LOW (ref 12.0–15.0)
Lymphocytes Relative: 14 % (ref 12.0–46.0)
Lymphs Abs: 0.8 10*3/uL (ref 0.7–4.0)
MCHC: 33.8 g/dL (ref 30.0–36.0)
MCV: 92 fl (ref 78.0–100.0)
Monocytes Absolute: 0.6 10*3/uL (ref 0.1–1.0)
Monocytes Relative: 10.8 % (ref 3.0–12.0)
Neutro Abs: 3.9 10*3/uL (ref 1.4–7.7)
Neutrophils Relative %: 71.6 % (ref 43.0–77.0)
Platelets: 183 10*3/uL (ref 150.0–400.0)
RBC: 3.05 Mil/uL — ABNORMAL LOW (ref 3.87–5.11)
RDW: 16.5 % — ABNORMAL HIGH (ref 11.5–14.6)
WBC: 5.4 10*3/uL (ref 4.5–10.5)

## 2013-08-31 LAB — BASIC METABOLIC PANEL
BUN: 53 mg/dL — ABNORMAL HIGH (ref 6–23)
CO2: 27 mEq/L (ref 19–32)
Calcium: 8.8 mg/dL (ref 8.4–10.5)
Chloride: 98 mEq/L (ref 96–112)
Creatinine, Ser: 2.2 mg/dL — ABNORMAL HIGH (ref 0.4–1.2)
GFR: 23.16 mL/min — ABNORMAL LOW (ref >60.00)
Glucose, Bld: 359 mg/dL — ABNORMAL HIGH (ref 70–99)
Potassium: 4.5 mEq/L (ref 3.5–5.1)
Sodium: 132 mEq/L — ABNORMAL LOW (ref 135–145)

## 2013-08-31 MED ORDER — ISOSORBIDE MONONITRATE 20 MG PO TABS: 20.0000 mg | ORAL_TABLET | Freq: Two times a day (BID) | ORAL | Status: DC

## 2013-08-31 MED ORDER — HYDRALAZINE HCL 25 MG PO TABS: ORAL_TABLET | ORAL | Status: DC

## 2013-08-31 MED ORDER — FERROUS SULFATE 324 (65 FE) MG PO TBEC: 324.0000 mg | DELAYED_RELEASE_TABLET | Freq: Every day | ORAL | Status: DC

## 2013-08-31 NOTE — Progress Notes (Signed)
Patient ID: [name], female   DOB: 10-01-1935, 78 y.o.   MRN: [id number]    Patient Name: Nicole [name] Date of Encounter: 08/31/2013  Primary Care Provider:  Michele Mcalpine, MD Primary Cardiologist: Tobias Alexander, H   Patient Profile  1 week follow up after hospital discharge  Problem List   Past Medical History  Diagnosis Date  . Retinopathy   . Hypertension   . Chronic diastolic CHF (congestive heart failure)     has a normal EF per echo 03/2011 & 5/2013with diastolic dysfunction  . CAD (coronary artery disease)     Prior PCI in 1998 x 2; s/p cath in 2001, negative Myoview in 2008 & 2011, s/p cath in June 2012 with calcified disease of the proximal LCX with normal flow reserve. She has been managed medically due to her other morbidities  . Cerebrovascular disease   . Peripheral vascular disease     prior angiography showing RSFA stenosis, left anterior tibial and left posterior tibial stenoses  . Hypercholesterolemia   . Diabetes mellitus     insulin dependent  . Hypothyroidism   . Hiatal hernia   . Degenerative joint disease   . Osteoporosis   . Vitamin D deficiency   . Anemia of chronic disease   . Renal insufficiency   . Shortness of breath   . Anginal pain    Past Surgical History  Procedure Laterality Date  . Abdominal aortogram  07/27/2003    by Dr. Chales Abrahams  . Bilateral lower extremity angiogram  07/27/2003    by Dr. Chales Abrahams  . Selective angiography,right superficial femoral artery, right and left iliac arteries  07/27/2003    by Dr. Chales Abrahams  . Measurement of gradient right and left iliac arteries  07/27/2003    by Dr. Chales Abrahams  . Bilateral cataract sugery  2000 and 2002    by Dr. Cecilie Kicks  . Repair left hip fracture  04/2008    by Dr. Charlann Boxer    Allergies  Allergies  Allergen Reactions  . Ibuprofen     REACTION: edema---thyroid problems  . Prednisone     Causes severe fluid retention    HPI  Nicole [name] is a 78 year old female with multiple  medical problems including CKD, CAD and HFpEF that was recently hospitalized for acute on chronic heart failure and chest pain with ECG changes in the settings of hypertensive urgency.  She was diuresed about 20 lbs of fluids. She had a acute on chronic renal failure preventing further diuresis despite residual overload. She was discharged home on 80 mg Lasix BID. She feels significantly better, improved SOB and LE edema. However she feels very tired most of the times.   Home Medications  Prior to Admission medications   Medication Sig Start Date End Date Taking? Authorizing Provider  acetaminophen (TYLENOL) 500 MG tablet Take 500 mg by mouth every 6 (six) hours as needed for pain.   Yes Historical Provider, MD  amLODipine (NORVASC) 10 MG tablet Take 1 tablet (10 mg total) by mouth daily. 08/22/13  Yes Renae Fickle, MD  aspirin EC 81 MG tablet Take 81 mg by mouth daily.   Yes Historical Provider, MD  atorvastatin (LIPITOR) 20 MG tablet Take 1 tablet (20 mg total) by mouth daily at 6 PM. 08/22/13  Yes Renae Fickle, MD  calcitRIOL (ROCALTROL) 0.25 MCG capsule Take 1 capsule (0.25 mcg total) by mouth daily. 08/22/13  Yes Renae Fickle, MD  carvedilol (COREG) 12.5 MG tablet Take 1  tablet (12.5 mg total) by mouth 2 (two) times daily with a meal. 08/25/13  Yes Rollene Rotunda, MD  Cholecalciferol (VITAMIN D) 1000 UNITS capsule Take 1,000 Units by mouth daily.     Yes Historical Provider, MD  furosemide (LASIX) 80 MG tablet Take 1 tablet (80 mg total) by mouth 2 (two) times daily. 08/22/13  Yes Renae Fickle, MD  insulin glargine (LANTUS) 100 UNIT/ML injection Inject 0.2 mLs (20 Units total) into the skin at bedtime. 04/26/13  Yes Michele Mcalpine, MD  insulin lispro (HUMALOG) 100 UNIT/ML injection Inject 0-10 Units into the skin 3 (three) times daily before meals. Home sliding scale   Yes Historical Provider, MD  levothyroxine (SYNTHROID) 75 MCG tablet Take 1 tablet (75 mcg total) by mouth daily. 01/26/13   Yes Michele Mcalpine, MD  nitroGLYCERIN (NITROSTAT) 0.4 MG SL tablet Place 0.4 mg under the tongue every 5 (five) minutes as needed for chest pain.   Yes Historical Provider, MD  pantoprazole (PROTONIX) 40 MG tablet Take 1 tablet (40 mg total) by mouth daily. 08/29/13  Yes Michele Mcalpine, MD  traMADol (ULTRAM) 50 MG tablet Take 1 tablet (50 mg total) by mouth every 6 (six) hours as needed for pain. 08/11/13  Yes Michele Mcalpine, MD  ferrous sulfate 324 (65 FE) MG TBEC Take 1 tablet (324 mg total) by mouth daily. 08/31/13   Lars Masson, MD  hydrALAZINE (APRESOLINE) 25 MG tablet Take one and one half tablets (37.5 mg total) three (3) times daily 08/31/13   Lars Masson, MD  isosorbide mononitrate (ISMO,MONOKET) 20 MG tablet Take 1 tablet (20 mg total) by mouth 2 (two) times daily. 08/31/13   Lars Masson, MD   Review of Systems  +Fatigue.  All other systems reviewed and are otherwise negative except as noted above.  Physical Exam  Blood pressure 150/74, pulse 52, weight 128 lb (58.06 kg).  General: Pleasant, NAD Psych: Normal affect. Neuro: Alert and oriented X 3. Moves all extremities spontaneously. HEENT: Normal  Neck: Supple without bruits or JVD. Lungs:  Resp regular and unlabored, CTA. Heart: RRR no s3, s4, holosystolic murmur 2/6 at LLSB. Abdomen: Soft, non-tender, non-distended, BS + x 4.  Extremities: No clubbing, cyanosis or edema. Weak DP/PT B/L, Radials 2+ and equal bilaterally.  Accessory Clinical Findings  ECG - Sinus bradycardia, otherwise normal  Assessment & Plan  Nicole [name] is a 78 year old female with multiple medical problems including CKD, CAD and HFpEF that was recently hospitalized for acute on chronic heart failure and chest pain with ECG changes in the settings of hypertensive urgency.   1. Diastolic heart failure - the patient is at her dry weight and feeling well. We will recheck her BMP today to assess her Crea/GFR and follow with Lasix dose. Currently 80  mg PO BID.  2. Resistant hypertension - normotensive today, continue the same regimen, we will order ISDN and Hydralazine separately as there is very hiugh copay for BiDil, no generic available Amlodipine 10 mg QD  Coreg 12.5 mg BID  Clonidine 0.1 mg BID  BiDil 20-37.5 mg Q8H  - hold ACEI - bad AKI after initiation  3. Acute on chronic kidney failure, stage IV - check today, adjust Lasix dose afterwards  4. Fatigue - might be related to her normocytic anemia, most probably of chronic diseases, we will prescribe her FeS 325 mg daily with colace to prevent constipation  5. CAD - chest pain free,  - medical  management   6. Peripheral arterial disease - B/L, not significant claudications, possible intervention once kidney function is improved   Follow up in 1 month  Tobias Alexander, Rexene Edison, MD 08/31/2013, 11:51 AM

## 2013-08-31 NOTE — Patient Instructions (Addendum)
Your physician recommends that you return for lab work today: BMET, CBC  Your physician has recommended you make the following change in your medication:  1) Take colace if needed for constipation  2) Stop Bidil 3) Start Isosorbide 20 mg twice daily 4) Start Hydralazine 37.5 mg three times daily 5) Start iron supplement  Your physician recommends that you schedule a follow-up appointment in: one month with Dr. Delton See

## 2013-09-01 ENCOUNTER — Other Ambulatory Visit: Payer: Self-pay

## 2013-09-01 MED ORDER — FUROSEMIDE 40 MG PO TABS: 40.0000 mg | ORAL_TABLET | Freq: Two times a day (BID) | ORAL | Status: DC

## 2013-09-14 ENCOUNTER — Encounter: Payer: Self-pay | Admitting: *Deleted

## 2013-09-14 ENCOUNTER — Other Ambulatory Visit: Payer: Self-pay | Admitting: *Deleted

## 2013-09-14 ENCOUNTER — Telehealth: Payer: Self-pay | Admitting: *Deleted

## 2013-09-14 MED ORDER — CLONIDINE HCL 0.1 MG PO TABS: 0.1000 mg | ORAL_TABLET | Freq: Two times a day (BID) | ORAL | Status: DC

## 2013-09-14 MED ORDER — ATORVASTATIN CALCIUM 20 MG PO TABS: 20.0000 mg | ORAL_TABLET | Freq: Every day | ORAL | Status: DC

## 2013-09-14 MED ORDER — AMLODIPINE BESYLATE 10 MG PO TABS: 10.0000 mg | ORAL_TABLET | Freq: Every day | ORAL | Status: DC

## 2013-09-14 NOTE — Telephone Encounter (Signed)
Called and spoke with patient, she has been taking clonidine since her visit with Dr Delton See, she states it just makes her a little sleepy, I will refill for her.

## 2013-09-14 NOTE — Telephone Encounter (Signed)
This encounter was created in error - please disregard.

## 2013-09-14 NOTE — Telephone Encounter (Signed)
Patient called requesting refill on Clonidine. It is unclear if she is suppossed to still be taking this. See telephone note 08/30/13. Thanks, MI

## 2013-09-28 ENCOUNTER — Telehealth: Payer: Self-pay | Admitting: Cardiology

## 2013-09-28 NOTE — Telephone Encounter (Signed)
New message    On hydralazine.  It is causing bad diarrhea.  Need advice.

## 2013-09-28 NOTE — Telephone Encounter (Addendum)
At pt's last OV with Dr Delton See on 08/31/13 the pt was advised to start taking Hydralazine 37.5 mg TID. Pt states that she is having diarrhea and that food tastes bitter to her now. She thinks these symptoms are due to Hydralazine. Please advise.

## 2013-10-02 ENCOUNTER — Encounter (HOSPITAL_COMMUNITY): Payer: Self-pay

## 2013-10-02 ENCOUNTER — Ambulatory Visit (HOSPITAL_COMMUNITY)
Admission: RE | Admit: 2013-10-02 | Discharge: 2013-10-02 | Disposition: A | Discharge status: Home / Self Care | Payer: Medicare Other | Source: Ambulatory Visit | Attending: Nephrology | Admitting: Nephrology

## 2013-10-02 ENCOUNTER — Encounter: Payer: Self-pay | Admitting: Cardiology

## 2013-10-02 ENCOUNTER — Ambulatory Visit (INDEPENDENT_AMBULATORY_CARE_PROVIDER_SITE_OTHER): Payer: Medicare Other | Admitting: Cardiology

## 2013-10-02 VITALS — BP 158/48 | HR 62 | Temp 97.5°F | Resp 18

## 2013-10-02 VITALS — BP 122/44 | HR 67 | Ht 64.0 in | Wt 140.0 lb

## 2013-10-02 DIAGNOSIS — N183 Chronic kidney disease, stage 3 unspecified: Secondary | ICD-10-CM | POA: Insufficient documentation

## 2013-10-02 DIAGNOSIS — I2510 Atherosclerotic heart disease of native coronary artery without angina pectoris: Secondary | ICD-10-CM

## 2013-10-02 DIAGNOSIS — D638 Anemia in other chronic diseases classified elsewhere: Secondary | ICD-10-CM | POA: Insufficient documentation

## 2013-10-02 LAB — RENAL FUNCTION PANEL
Albumin: 2.9 g/dL — ABNORMAL LOW (ref 3.5–5.2)
BUN: 47 mg/dL — ABNORMAL HIGH (ref 6–23)
Creatinine, Ser: 1.79 mg/dL — ABNORMAL HIGH (ref 0.50–1.10)
Phosphorus: 4.2 mg/dL (ref 2.3–4.6)

## 2013-10-02 LAB — IRON AND TIBC: TIBC: 196 ug/dL — ABNORMAL LOW (ref 250–470)

## 2013-10-02 LAB — FERRITIN: Ferritin: 229 ng/mL (ref 10–291)

## 2013-10-02 LAB — HEMOGLOBIN: Hemoglobin: 10.2 g/dL — ABNORMAL LOW (ref 12.0–15.0)

## 2013-10-02 MED ORDER — CARVEDILOL 12.5 MG PO TABS: 12.5000 mg | ORAL_TABLET | Freq: Two times a day (BID) | ORAL | Status: DC

## 2013-10-02 MED ORDER — EPOETIN ALFA 10000 UNIT/ML IJ SOLN
10000.0000 [IU] | INTRAMUSCULAR | Status: DC
  Administered 2013-10-02: 10000 [IU] via SUBCUTANEOUS
  Filled 2013-10-02: qty 1

## 2013-10-02 MED ORDER — ATORVASTATIN CALCIUM 20 MG PO TABS: 20.0000 mg | ORAL_TABLET | Freq: Every day | ORAL | Status: DC

## 2013-10-02 MED ORDER — ISOSORBIDE MONONITRATE 20 MG PO TABS: 20.0000 mg | ORAL_TABLET | Freq: Two times a day (BID) | ORAL | Status: DC

## 2013-10-02 MED ORDER — AMLODIPINE BESYLATE 10 MG PO TABS: 10.0000 mg | ORAL_TABLET | Freq: Every day | ORAL | Status: DC

## 2013-10-02 MED ORDER — FUROSEMIDE 40 MG PO TABS: 80.0000 mg | ORAL_TABLET | Freq: Two times a day (BID) | ORAL | Status: DC

## 2013-10-02 NOTE — Progress Notes (Signed)
Patient ID: Nicole [name], female   DOB: 1935-07-13, 78 y.o.   MRN: [id number]  Patient Name: Nicole [name] Date of Encounter: 10/02/2013  Primary Care Provider:  Michele Mcalpine, MD Primary Cardiologist: Tobias Alexander, H   Patient Profile  1 month follow up after hospital discharge for CHF  Problem List   Past Medical History  Diagnosis Date  . Retinopathy   . Hypertension   . Chronic diastolic CHF (congestive heart failure)     has a normal EF per echo 03/2011 & 5/2013with diastolic dysfunction  . CAD (coronary artery disease)     Prior PCI in 1998 x 2; s/p cath in 2001, negative Myoview in 2008 & 2011, s/p cath in June 2012 with calcified disease of the proximal LCX with normal flow reserve. She has been managed medically due to her other morbidities  . Cerebrovascular disease   . Peripheral vascular disease     prior angiography showing RSFA stenosis, left anterior tibial and left posterior tibial stenoses  . Hypercholesterolemia   . Diabetes mellitus     insulin dependent  . Hypothyroidism   . Hiatal hernia   . Degenerative joint disease   . Osteoporosis   . Vitamin D deficiency   . Anemia of chronic disease   . Renal insufficiency   . Shortness of breath   . Anginal pain    Past Surgical History  Procedure Laterality Date  . Abdominal aortogram  07/27/2003    by Dr. Chales Abrahams  . Bilateral lower extremity angiogram  07/27/2003    by Dr. Chales Abrahams  . Selective angiography,right superficial femoral artery, right and left iliac arteries  07/27/2003    by Dr. Chales Abrahams  . Measurement of gradient right and left iliac arteries  07/27/2003    by Dr. Chales Abrahams  . Bilateral cataract sugery  2000 and 2002    by Dr. Cecilie Kicks  . Repair left hip fracture  04/2008    by Dr. Charlann Boxer    Allergies  Allergies  Allergen Reactions  . Ibuprofen     REACTION: edema---thyroid problems  . Prednisone     Causes severe fluid retention    HPI  Nicole [name] is a 78 year old female with  multiple medical problems including CKD, CAD and HFpEF that was recently hospitalized for acute on chronic heart failure and chest pain with ECG changes in the settings of hypertensive urgency.  She was diuresed about 20 lbs of fluids. She had a acute on chronic renal failure preventing further diuresis despite residual overload. She was discharged home on 80 mg Lasix BID that was decreased at the last visit because she felt better.  She comes today after a months and has worsening DOE and LE edema. She stopped taking Hydralazine as she felt that it is a cause of her diarheas. She was also seen by a nephrologist and she will be started on Procrit injections for the anemia. She denies chest pain or palpitations.  Home Medications  Prior to Admission medications   Medication Sig Start Date End Date Taking? Authorizing Provider  acetaminophen (TYLENOL) 500 MG tablet Take 500 mg by mouth every 6 (six) hours as needed for pain.   Yes Historical Provider, MD  amLODipine (NORVASC) 10 MG tablet Take 1 tablet (10 mg total) by mouth daily. 08/22/13  Yes Renae Fickle, MD  aspirin EC 81 MG tablet Take 81 mg by mouth daily.   Yes Historical Provider, MD  atorvastatin (LIPITOR) 20 MG tablet Take  1 tablet (20 mg total) by mouth daily at 6 PM. 08/22/13  Yes Renae Fickle, MD  calcitRIOL (ROCALTROL) 0.25 MCG capsule Take 1 capsule (0.25 mcg total) by mouth daily. 08/22/13  Yes Renae Fickle, MD  carvedilol (COREG) 12.5 MG tablet Take 1 tablet (12.5 mg total) by mouth 2 (two) times daily with a meal. 08/25/13  Yes Rollene Rotunda, MD  Cholecalciferol (VITAMIN D) 1000 UNITS capsule Take 1,000 Units by mouth daily.     Yes Historical Provider, MD  furosemide (LASIX) 80 MG tablet Take 1 tablet (80 mg total) by mouth 2 (two) times daily. 08/22/13  Yes Renae Fickle, MD  insulin glargine (LANTUS) 100 UNIT/ML injection Inject 0.2 mLs (20 Units total) into the skin at bedtime. 04/26/13  Yes Michele Mcalpine, MD  insulin  lispro (HUMALOG) 100 UNIT/ML injection Inject 0-10 Units into the skin 3 (three) times daily before meals. Home sliding scale   Yes Historical Provider, MD  levothyroxine (SYNTHROID) 75 MCG tablet Take 1 tablet (75 mcg total) by mouth daily. 01/26/13  Yes Michele Mcalpine, MD  nitroGLYCERIN (NITROSTAT) 0.4 MG SL tablet Place 0.4 mg under the tongue every 5 (five) minutes as needed for chest pain.   Yes Historical Provider, MD  pantoprazole (PROTONIX) 40 MG tablet Take 1 tablet (40 mg total) by mouth daily. 08/29/13  Yes Michele Mcalpine, MD  traMADol (ULTRAM) 50 MG tablet Take 1 tablet (50 mg total) by mouth every 6 (six) hours as needed for pain. 08/11/13  Yes Michele Mcalpine, MD  ferrous sulfate 324 (65 FE) MG TBEC Take 1 tablet (324 mg total) by mouth daily. 08/31/13   Lars Masson, MD  hydrALAZINE (APRESOLINE) 25 MG tablet Take one and one half tablets (37.5 mg total) three (3) times daily 08/31/13   Lars Masson, MD  isosorbide mononitrate (ISMO,MONOKET) 20 MG tablet Take 1 tablet (20 mg total) by mouth 2 (two) times daily. 08/31/13   Lars Masson, MD   Review of Systems  +Fatigue.  All other systems reviewed and are otherwise negative except as noted above.  Physical Exam  Blood pressure 122/44, pulse 67, height 5\' 4"  (1.626 m), weight 140 lb (63.504 kg).  General: Pleasant, NAD Psych: Normal affect. Neuro: Alert and oriented X 3. Moves all extremities spontaneously. HEENT: Normal  Neck: Supple without bruits or JVD. Lungs:  Resp regular and unlabored, CTA. Heart: RRR no s3, s4, holosystolic murmur 2/6 at LLSB. Abdomen: Soft, non-tender, non-distended, BS + x 4.  Extremities: No clubbing, cyanosis, +2 tight lower extremity edema, mild blisters. Weak DP/PT B/L, Radials 2+ and equal bilaterally.  Accessory Clinical Findings  ECG - Sinus bradycardia, otherwise normal  Assessment & Plan  Nicole [name] is a 78 year old female with multiple medical problems including CKD, CAD and HFpEF  that was recently hospitalized for acute on chronic heart failure and chest pain with ECG changes in the settings of hypertensive urgency.   1. Diastolic heart failure - increased weight, worsening SOB. We will increase Lasix to 80 mg PO BID and follow in 2 weeks with CMP.   2. Resistant hypertension - normotensive today, the patient stopped taking Hydralazine (she believes it gives her diarrheas).  Amlodipine 10 mg QD  Coreg 12.5 mg BID  Clonidine 0.1 mg BID  ISDN 20 mg PO Q8H - hold ACEI - bad AKI after initiation  3. Acute on chronic kidney failure, stage IV - check today, adjust Lasix dose afterwards  4. Fatigue - might be related to her normocytic anemia, most probably of chronic diseases. The patient is being started on Procrit injections (epoetin).  5. CAD - chest pain free,  - medical management   6. Peripheral arterial disease - B/L, not significant claudications, possible intervention once kidney function is improved   Follow up in 2 weeks with CMP.    Tobias Alexander, Rexene Edison, MD 10/02/2013, 10:41 AM

## 2013-10-02 NOTE — Patient Instructions (Signed)
Your physician has recommended you make the following change in your medication: increase Furosemide to 80 mg twice daily  Your physician recommends that you return for lab work in: on day of next office visit in 2 weeks  Your physician recommends that you schedule a follow-up appointment in: 2 weeks

## 2013-10-09 ENCOUNTER — Ambulatory Visit (HOSPITAL_COMMUNITY)
Admission: RE | Admit: 2013-10-09 | Discharge: 2013-10-09 | Disposition: A | Discharge status: Home / Self Care | Payer: Medicare Other | Source: Ambulatory Visit | Attending: Nephrology | Admitting: Nephrology

## 2013-10-09 ENCOUNTER — Encounter (HOSPITAL_COMMUNITY): Payer: Self-pay

## 2013-10-09 VITALS — BP 164/54 | HR 58 | Temp 97.0°F | Resp 16

## 2013-10-09 DIAGNOSIS — N183 Chronic kidney disease, stage 3 unspecified: Secondary | ICD-10-CM | POA: Insufficient documentation

## 2013-10-09 DIAGNOSIS — D638 Anemia in other chronic diseases classified elsewhere: Secondary | ICD-10-CM | POA: Insufficient documentation

## 2013-10-09 MED ORDER — EPOETIN ALFA 10000 UNIT/ML IJ SOLN
10000.0000 [IU] | INTRAMUSCULAR | Status: DC
  Administered 2013-10-09: 10000 [IU] via SUBCUTANEOUS
  Filled 2013-10-09: qty 1

## 2013-10-10 ENCOUNTER — Telehealth: Payer: Self-pay | Admitting: Pulmonary Disease

## 2013-10-10 ENCOUNTER — Ambulatory Visit: Payer: Medicare Other | Admitting: Cardiology

## 2013-10-10 MED ORDER — CIPROFLOXACIN HCL 250 MG PO TABS: 250.0000 mg | ORAL_TABLET | Freq: Two times a day (BID) | ORAL | Status: DC

## 2013-10-10 NOTE — Telephone Encounter (Signed)
I spoke with pt. She thinks she has UTI. C/o painful urination, burning sensation, slight odor. This has been going on x Saturday. No blood in urine. She is requesting to have something called in. Please advise SN thanks  Allergies  Allergen Reactions  . Ibuprofen     REACTION: edema---thyroid problems  . Prednisone     Causes severe fluid retention

## 2013-10-10 NOTE — Telephone Encounter (Signed)
Per SN--  Ok to send in cipro 250 mg  #14  1 po bid .  Called and spoke with pt and she is aware of abx that has been sent to the pharmacy.  Nothing further is needed.

## 2013-10-16 ENCOUNTER — Encounter (HOSPITAL_COMMUNITY): Payer: Self-pay

## 2013-10-16 ENCOUNTER — Encounter (HOSPITAL_COMMUNITY)
Admission: RE | Admit: 2013-10-16 | Discharge: 2013-10-16 | Disposition: A | Discharge status: Home / Self Care | Payer: Medicare Other | Source: Ambulatory Visit | Attending: Nephrology | Admitting: Nephrology

## 2013-10-16 VITALS — BP 156/46 | HR 60 | Temp 97.5°F | Resp 16

## 2013-10-16 DIAGNOSIS — D638 Anemia in other chronic diseases classified elsewhere: Secondary | ICD-10-CM | POA: Insufficient documentation

## 2013-10-16 DIAGNOSIS — I129 Hypertensive chronic kidney disease with stage 1 through stage 4 chronic kidney disease, or unspecified chronic kidney disease: Secondary | ICD-10-CM | POA: Insufficient documentation

## 2013-10-16 DIAGNOSIS — N183 Chronic kidney disease, stage 3 unspecified: Secondary | ICD-10-CM | POA: Insufficient documentation

## 2013-10-16 LAB — HEMOGLOBIN: Hemoglobin: 11.2 g/dL — ABNORMAL LOW (ref 12.0–15.0)

## 2013-10-16 MED ORDER — EPOETIN ALFA 10000 UNIT/ML IJ SOLN
10000.0000 [IU] | INTRAMUSCULAR | Status: DC
  Administered 2013-10-16: 10000 [IU] via SUBCUTANEOUS
  Filled 2013-10-16: qty 1

## 2013-10-17 ENCOUNTER — Encounter: Payer: Self-pay | Admitting: Cardiology

## 2013-10-17 ENCOUNTER — Other Ambulatory Visit: Payer: Medicare Other

## 2013-10-17 ENCOUNTER — Ambulatory Visit (INDEPENDENT_AMBULATORY_CARE_PROVIDER_SITE_OTHER): Payer: Medicare Other | Admitting: Cardiology

## 2013-10-17 VITALS — BP 150/56 | HR 62 | Ht 64.0 in | Wt 146.0 lb

## 2013-10-17 DIAGNOSIS — I509 Heart failure, unspecified: Secondary | ICD-10-CM

## 2013-10-17 LAB — BASIC METABOLIC PANEL
BUN: 47 mg/dL — ABNORMAL HIGH (ref 6–23)
CO2: 24 mEq/L (ref 19–32)
Calcium: 8.7 mg/dL (ref 8.4–10.5)
Chloride: 100 mEq/L (ref 96–112)
Creatinine, Ser: 2.1 mg/dL — ABNORMAL HIGH (ref 0.4–1.2)
GFR: 23.91 mL/min — ABNORMAL LOW (ref >60.00)
Glucose, Bld: 317 mg/dL — ABNORMAL HIGH (ref 70–99)
Potassium: 3.9 mEq/L (ref 3.5–5.1)
Sodium: 134 mEq/L — ABNORMAL LOW (ref 135–145)

## 2013-10-17 NOTE — Patient Instructions (Addendum)
Your physician recommends that you return for lab work in: today  Your physician recommends that you schedule a follow-up appointment in: 2 weeks

## 2013-10-17 NOTE — Progress Notes (Signed)
Patient ID: SHONDRIKA HOQUE, female   DOB: 01/02/1935, 78 y.o.   MRN: [id number]  Patient Name: Nicole [name] Date of Encounter: 10/17/2013  Primary Care Provider:  Michele Mcalpine, MD Primary Cardiologist: Tobias Alexander, H   Patient Profile  6 weeks follow up after hospital discharge for CHF  Problem List   Past Medical History  Diagnosis Date  . Retinopathy   . Hypertension   . Chronic diastolic CHF (congestive heart failure)     has a normal EF per echo 03/2011 & 5/2013with diastolic dysfunction  . CAD (coronary artery disease)     Prior PCI in 1998 x 2; s/p cath in 2001, negative Myoview in 2008 & 2011, s/p cath in June 2012 with calcified disease of the proximal LCX with normal flow reserve. She has been managed medically due to her other morbidities  . Cerebrovascular disease   . Peripheral vascular disease     prior angiography showing RSFA stenosis, left anterior tibial and left posterior tibial stenoses  . Hypercholesterolemia   . Diabetes mellitus     insulin dependent  . Hypothyroidism   . Hiatal hernia   . Degenerative joint disease   . Osteoporosis   . Vitamin D deficiency   . Anemia of chronic disease   . Renal insufficiency   . Shortness of breath   . Anginal pain    Past Surgical History  Procedure Laterality Date  . Abdominal aortogram  07/27/2003    by Dr. Chales Abrahams  . Bilateral lower extremity angiogram  07/27/2003    by Dr. Chales Abrahams  . Selective angiography,right superficial femoral artery, right and left iliac arteries  07/27/2003    by Dr. Chales Abrahams  . Measurement of gradient right and left iliac arteries  07/27/2003    by Dr. Chales Abrahams  . Bilateral cataract sugery  2000 and 2002    by Dr. Cecilie Kicks  . Repair left hip fracture  04/2008    by Dr. Charlann Boxer    Allergies  Allergies  Allergen Reactions  . Ibuprofen     REACTION: edema---thyroid problems  . Prednisone     Causes severe fluid retention    HPI  Ms Bashor is a 78 year old female with  multiple medical problems including CKD, CAD and HFpEF that was recently hospitalized for acute on chronic heart failure and chest pain with ECG changes in the settings of hypertensive urgency.  She was diuresed about 20 lbs of fluids. She had a acute on chronic renal failure preventing further diuresis despite residual overload. She was discharged home on 80 mg Lasix BID that was decreased at the last visit because she felt better.   She stopped taking Hydralazine as she felt that it is a cause of her diarheas. Her Lasix was increased to 80 mg PO BID for worsening SOB and LE edema at the last visit, she reports minimal improvement. She reports UTI the last week, also B/L knee swelling and pain that limits her activities. She is scheduled for an orthopedics visit. She has been receiving Procrit injections for the anemia. She denies chest pain or palpitations.  Home Medications  Prior to Admission medications   Medication Sig Start Date End Date Taking? Authorizing Provider  acetaminophen (TYLENOL) 500 MG tablet Take 500 mg by mouth every 6 (six) hours as needed for pain.   Yes Historical Provider, MD  amLODipine (NORVASC) 10 MG tablet Take 1 tablet (10 mg total) by mouth daily. 08/22/13  Yes Renae Fickle, MD  aspirin EC 81 MG tablet Take 81 mg by mouth daily.   Yes Historical Provider, MD  atorvastatin (LIPITOR) 20 MG tablet Take 1 tablet (20 mg total) by mouth daily at 6 PM. 08/22/13  Yes Renae Fickle, MD  calcitRIOL (ROCALTROL) 0.25 MCG capsule Take 1 capsule (0.25 mcg total) by mouth daily. 08/22/13  Yes Renae Fickle, MD  carvedilol (COREG) 12.5 MG tablet Take 1 tablet (12.5 mg total) by mouth 2 (two) times daily with a meal. 08/25/13  Yes Rollene Rotunda, MD  Cholecalciferol (VITAMIN D) 1000 UNITS capsule Take 1,000 Units by mouth daily.     Yes Historical Provider, MD  furosemide (LASIX) 80 MG tablet Take 1 tablet (80 mg total) by mouth 2 (two) times daily. 08/22/13  Yes Renae Fickle,  MD  insulin glargine (LANTUS) 100 UNIT/ML injection Inject 0.2 mLs (20 Units total) into the skin at bedtime. 04/26/13  Yes Michele Mcalpine, MD  insulin lispro (HUMALOG) 100 UNIT/ML injection Inject 0-10 Units into the skin 3 (three) times daily before meals. Home sliding scale   Yes Historical Provider, MD  levothyroxine (SYNTHROID) 75 MCG tablet Take 1 tablet (75 mcg total) by mouth daily. 01/26/13  Yes Michele Mcalpine, MD  nitroGLYCERIN (NITROSTAT) 0.4 MG SL tablet Place 0.4 mg under the tongue every 5 (five) minutes as needed for chest pain.   Yes Historical Provider, MD  pantoprazole (PROTONIX) 40 MG tablet Take 1 tablet (40 mg total) by mouth daily. 08/29/13  Yes Michele Mcalpine, MD  traMADol (ULTRAM) 50 MG tablet Take 1 tablet (50 mg total) by mouth every 6 (six) hours as needed for pain. 08/11/13  Yes Michele Mcalpine, MD  ferrous sulfate 324 (65 FE) MG TBEC Take 1 tablet (324 mg total) by mouth daily. 08/31/13   Lars Masson, MD  hydrALAZINE (APRESOLINE) 25 MG tablet Take one and one half tablets (37.5 mg total) three (3) times daily 08/31/13   Lars Masson, MD  isosorbide mononitrate (ISMO,MONOKET) 20 MG tablet Take 1 tablet (20 mg total) by mouth 2 (two) times daily. 08/31/13   Lars Masson, MD   Review of Systems  +Fatigue.  All other systems reviewed and are otherwise negative except as noted above.  Physical Exam  Blood pressure 150/56, pulse 62, height 5\' 4"  (1.626 m), weight 146 lb (66.225 kg), SpO2 96.00%.  General: Pleasant, NAD Psych: Normal affect. Neuro: Alert and oriented X 3. Moves all extremities spontaneously. HEENT: Normal  Neck: Supple without bruits or JVD. Lungs:  Resp regular and unlabored, CTA. Heart: RRR no s3, s4, holosystolic murmur 2/6 at LLSB. Abdomen: Soft, non-tender, non-distended, BS + x 4.  Extremities: No clubbing, cyanosis, +2 tight lower extremity edema, mild blisters. Weak DP/PT B/L, Radials 2+ and equal bilaterally.  Accessory Clinical  Findings  ECG - Sinus bradycardia, otherwise normal  Assessment & Plan  Ms Kauzlarich is a 78 year old female with multiple medical problems including CKD, CAD and HFpEF that was recently hospitalized for acute on chronic heart failure and chest pain with ECG changes in the settings of hypertensive urgency.   1. Diastolic heart failure - increased weight, worsening SOB.  Lasix increased to 80 mg PO BID at the last visit, minimal improvement, we will check labs today and call her with Lasix adjustment.  2. Resistant hypertension - uncontrolled, the patient stopped taking Hydralazine (she believes it gives her diarrheas), we will restart now and see the response  Amlodipine 10 mg QD  Coreg 12.5 mg BID  Clonidine 0.1 mg at night (she feels tired if she takes it in the morning) ISDN 20 mg PO Q8H - hold ACEI - bad AKI after initiation  3. Acute on chronic kidney failure, stage IV - check labs today, adjust Lasix dose afterwards   4. Fatigue - might be related to her normocytic anemia, most probably of chronic diseases. The patient is being started on Procrit injections (epoetin).  5. CAD - chest pain free,  - medical management   6. Peripheral arterial disease - B/L, not significant claudications, possible intervention once kidney function is improved   Follow up in 3 weeks.    Tobias Alexander, Rexene Edison, MD 10/17/2013, 2:32 PM

## 2013-10-18 ENCOUNTER — Other Ambulatory Visit: Payer: Self-pay

## 2013-10-24 ENCOUNTER — Encounter (HOSPITAL_COMMUNITY)
Admission: RE | Admit: 2013-10-24 | Discharge: 2013-10-24 | Disposition: A | Discharge status: Home / Self Care | Payer: Medicare Other | Source: Ambulatory Visit | Attending: Nephrology | Admitting: Nephrology

## 2013-10-24 ENCOUNTER — Encounter (HOSPITAL_COMMUNITY): Payer: Self-pay

## 2013-10-24 ENCOUNTER — Ambulatory Visit: Payer: Medicare Other | Admitting: Pulmonary Disease

## 2013-10-24 VITALS — BP 159/59 | HR 63 | Temp 96.4°F | Resp 18

## 2013-10-24 LAB — HEMOGLOBIN: Hemoglobin: 11.8 g/dL — ABNORMAL LOW (ref 12.0–15.0)

## 2013-10-24 MED ORDER — EPOETIN ALFA 10000 UNIT/ML IJ SOLN
10000.0000 [IU] | INTRAMUSCULAR | Status: DC
  Administered 2013-10-24: 10000 [IU] via SUBCUTANEOUS
  Filled 2013-10-24: qty 1

## 2013-10-29 ENCOUNTER — Emergency Department (HOSPITAL_COMMUNITY): Payer: Medicare Other

## 2013-10-29 ENCOUNTER — Encounter (HOSPITAL_COMMUNITY): Payer: Self-pay | Admitting: Emergency Medicine

## 2013-10-29 ENCOUNTER — Inpatient Hospital Stay (HOSPITAL_COMMUNITY)
Admission: EM | Admit: 2013-10-29 | Discharge: 2013-11-10 | DRG: 264 | Disposition: A | Discharge status: Home Health | Payer: Medicare Other | Attending: Cardiology | Admitting: Cardiology

## 2013-10-29 ENCOUNTER — Telehealth: Payer: Self-pay | Admitting: Nurse Practitioner

## 2013-10-29 DIAGNOSIS — Z794 Long term (current) use of insulin: Secondary | ICD-10-CM

## 2013-10-29 DIAGNOSIS — Z886 Allergy status to analgesic agent status: Secondary | ICD-10-CM

## 2013-10-29 DIAGNOSIS — I509 Heart failure, unspecified: Secondary | ICD-10-CM | POA: Diagnosis present

## 2013-10-29 DIAGNOSIS — J810 Acute pulmonary edema: Secondary | ICD-10-CM

## 2013-10-29 DIAGNOSIS — E108 Type 1 diabetes mellitus with unspecified complications: Secondary | ICD-10-CM

## 2013-10-29 DIAGNOSIS — I498 Other specified cardiac arrhythmias: Secondary | ICD-10-CM | POA: Diagnosis present

## 2013-10-29 DIAGNOSIS — IMO0001 Reserved for inherently not codable concepts without codable children: Principal | ICD-10-CM | POA: Diagnosis present

## 2013-10-29 DIAGNOSIS — Z888 Allergy status to other drugs, medicaments and biological substances status: Secondary | ICD-10-CM

## 2013-10-29 DIAGNOSIS — E559 Vitamin D deficiency, unspecified: Secondary | ICD-10-CM | POA: Diagnosis present

## 2013-10-29 DIAGNOSIS — D638 Anemia in other chronic diseases classified elsewhere: Secondary | ICD-10-CM

## 2013-10-29 DIAGNOSIS — E119 Type 2 diabetes mellitus without complications: Secondary | ICD-10-CM

## 2013-10-29 DIAGNOSIS — E7800 Pure hypercholesterolemia, unspecified: Secondary | ICD-10-CM | POA: Diagnosis present

## 2013-10-29 DIAGNOSIS — N179 Acute kidney failure, unspecified: Secondary | ICD-10-CM

## 2013-10-29 DIAGNOSIS — R0609 Other forms of dyspnea: Secondary | ICD-10-CM

## 2013-10-29 DIAGNOSIS — M7989 Other specified soft tissue disorders: Secondary | ICD-10-CM | POA: Diagnosis not present

## 2013-10-29 DIAGNOSIS — N186 End stage renal disease: Secondary | ICD-10-CM | POA: Diagnosis present

## 2013-10-29 DIAGNOSIS — I5033 Acute on chronic diastolic (congestive) heart failure: Secondary | ICD-10-CM | POA: Diagnosis present

## 2013-10-29 DIAGNOSIS — K449 Diaphragmatic hernia without obstruction or gangrene: Secondary | ICD-10-CM | POA: Diagnosis present

## 2013-10-29 DIAGNOSIS — I5031 Acute diastolic (congestive) heart failure: Secondary | ICD-10-CM

## 2013-10-29 DIAGNOSIS — N183 Chronic kidney disease, stage 3 unspecified: Secondary | ICD-10-CM

## 2013-10-29 DIAGNOSIS — Z992 Dependence on renal dialysis: Secondary | ICD-10-CM

## 2013-10-29 DIAGNOSIS — M1990 Unspecified osteoarthritis, unspecified site: Secondary | ICD-10-CM | POA: Diagnosis present

## 2013-10-29 DIAGNOSIS — N2581 Secondary hyperparathyroidism of renal origin: Secondary | ICD-10-CM | POA: Diagnosis present

## 2013-10-29 DIAGNOSIS — M810 Age-related osteoporosis without current pathological fracture: Secondary | ICD-10-CM | POA: Diagnosis present

## 2013-10-29 DIAGNOSIS — E039 Hypothyroidism, unspecified: Secondary | ICD-10-CM | POA: Diagnosis present

## 2013-10-29 DIAGNOSIS — K219 Gastro-esophageal reflux disease without esophagitis: Secondary | ICD-10-CM | POA: Diagnosis present

## 2013-10-29 DIAGNOSIS — I739 Peripheral vascular disease, unspecified: Secondary | ICD-10-CM

## 2013-10-29 DIAGNOSIS — Z9861 Coronary angioplasty status: Secondary | ICD-10-CM

## 2013-10-29 DIAGNOSIS — E1169 Type 2 diabetes mellitus with other specified complication: Secondary | ICD-10-CM | POA: Diagnosis not present

## 2013-10-29 DIAGNOSIS — E871 Hypo-osmolality and hyponatremia: Secondary | ICD-10-CM | POA: Diagnosis present

## 2013-10-29 DIAGNOSIS — Z8249 Family history of ischemic heart disease and other diseases of the circulatory system: Secondary | ICD-10-CM

## 2013-10-29 DIAGNOSIS — I10 Essential (primary) hypertension: Secondary | ICD-10-CM | POA: Diagnosis present

## 2013-10-29 DIAGNOSIS — R600 Localized edema: Secondary | ICD-10-CM

## 2013-10-29 DIAGNOSIS — E1139 Type 2 diabetes mellitus with other diabetic ophthalmic complication: Secondary | ICD-10-CM | POA: Diagnosis present

## 2013-10-29 DIAGNOSIS — D631 Anemia in chronic kidney disease: Secondary | ICD-10-CM | POA: Diagnosis present

## 2013-10-29 DIAGNOSIS — I2510 Atherosclerotic heart disease of native coronary artery without angina pectoris: Secondary | ICD-10-CM

## 2013-10-29 DIAGNOSIS — Z87891 Personal history of nicotine dependence: Secondary | ICD-10-CM

## 2013-10-29 DIAGNOSIS — I679 Cerebrovascular disease, unspecified: Secondary | ICD-10-CM | POA: Diagnosis present

## 2013-10-29 DIAGNOSIS — R0989 Other specified symptoms and signs involving the circulatory and respiratory systems: Secondary | ICD-10-CM

## 2013-10-29 DIAGNOSIS — R4181 Age-related cognitive decline: Secondary | ICD-10-CM | POA: Diagnosis present

## 2013-10-29 DIAGNOSIS — Z79899 Other long term (current) drug therapy: Secondary | ICD-10-CM

## 2013-10-29 DIAGNOSIS — Z7982 Long term (current) use of aspirin: Secondary | ICD-10-CM

## 2013-10-29 DIAGNOSIS — H3500 Unspecified background retinopathy: Secondary | ICD-10-CM | POA: Diagnosis present

## 2013-10-29 DIAGNOSIS — E11359 Type 2 diabetes mellitus with proliferative diabetic retinopathy without macular edema: Secondary | ICD-10-CM | POA: Diagnosis present

## 2013-10-29 HISTORY — DX: End stage renal disease: N18.6

## 2013-10-29 LAB — BASIC METABOLIC PANEL
BUN: 52 mg/dL — ABNORMAL HIGH (ref 6–23)
CO2: 20 mEq/L (ref 19–32)
Chloride: 97 mEq/L (ref 96–112)
Creatinine, Ser: 2.01 mg/dL — ABNORMAL HIGH (ref 0.50–1.10)
GFR calc Af Amer: 27 mL/min — ABNORMAL LOW (ref >90)
Glucose, Bld: 401 mg/dL — ABNORMAL HIGH (ref 70–99)
Potassium: 3.9 mEq/L (ref 3.5–5.1)
Sodium: 134 mEq/L — ABNORMAL LOW (ref 135–145)

## 2013-10-29 LAB — CBC
HCT: 36.2 % (ref 36.0–46.0)
HCT: 35.8 % — ABNORMAL LOW (ref 36.0–46.0)
Hemoglobin: 12.4 g/dL (ref 12.0–15.0)
Hemoglobin: 11.9 g/dL — ABNORMAL LOW (ref 12.0–15.0)
MCHC: 34.3 g/dL (ref 30.0–36.0)
MCHC: 33.2 g/dL (ref 30.0–36.0)
MCV: 93.3 fL (ref 78.0–100.0)
RBC: 3.74 MIL/uL — ABNORMAL LOW (ref 3.87–5.11)
RDW: 15.1 % (ref 11.5–15.5)
RDW: 15 % (ref 11.5–15.5)
WBC: 6.2 10*3/uL (ref 4.0–10.5)
WBC: 5.5 10*3/uL (ref 4.0–10.5)

## 2013-10-29 LAB — GLUCOSE, CAPILLARY: Glucose-Capillary: 395 mg/dL — ABNORMAL HIGH (ref 70–99)

## 2013-10-29 LAB — PRO B NATRIURETIC PEPTIDE: Pro B Natriuretic peptide (BNP): 5412 pg/mL — ABNORMAL HIGH (ref 0–450)

## 2013-10-29 LAB — POCT I-STAT TROPONIN I

## 2013-10-29 MED ORDER — FUROSEMIDE 10 MG/ML IJ SOLN: 80.0000 mg | Freq: Two times a day (BID) | INTRAMUSCULAR | Status: DC

## 2013-10-29 MED ORDER — PANTOPRAZOLE SODIUM 40 MG PO TBEC
40.0000 mg | DELAYED_RELEASE_TABLET | Freq: Every day | ORAL | Status: DC
  Administered 2013-10-30 – 2013-11-10 (×12): 40 mg via ORAL
  Filled 2013-10-29 (×11): qty 1

## 2013-10-29 MED ORDER — TRAMADOL HCL 50 MG PO TABS
50.0000 mg | ORAL_TABLET | Freq: Four times a day (QID) | ORAL | Status: DC | PRN
  Administered 2013-11-03 – 2013-11-08 (×3): 50 mg via ORAL
  Filled 2013-10-29 (×3): qty 1

## 2013-10-29 MED ORDER — AMLODIPINE BESYLATE 10 MG PO TABS
10.0000 mg | ORAL_TABLET | Freq: Every day | ORAL | Status: DC
  Administered 2013-10-30 – 2013-11-09 (×10): 10 mg via ORAL
  Filled 2013-10-29 (×11): qty 1

## 2013-10-29 MED ORDER — CLONIDINE HCL 0.1 MG PO TABS
0.1000 mg | ORAL_TABLET | Freq: Every day | ORAL | Status: DC
  Administered 2013-10-29 – 2013-10-30 (×2): 0.1 mg via ORAL
  Filled 2013-10-29 (×3): qty 1

## 2013-10-29 MED ORDER — ISOSORBIDE MONONITRATE 20 MG PO TABS
20.0000 mg | ORAL_TABLET | Freq: Two times a day (BID) | ORAL | Status: DC
  Administered 2013-10-29 – 2013-11-10 (×22): 20 mg via ORAL
  Filled 2013-10-29 (×30): qty 1

## 2013-10-29 MED ORDER — SODIUM CHLORIDE 0.9 % IJ SOLN: 3.0000 mL | INTRAMUSCULAR | Status: DC | PRN

## 2013-10-29 MED ORDER — HEPARIN SODIUM (PORCINE) 5000 UNIT/ML IJ SOLN
5000.0000 [IU] | Freq: Three times a day (TID) | INTRAMUSCULAR | Status: DC
  Administered 2013-10-29 – 2013-10-31 (×5): 5000 [IU] via SUBCUTANEOUS
  Filled 2013-10-29 (×8): qty 1

## 2013-10-29 MED ORDER — SODIUM CHLORIDE 0.9 % IV SOLN: 250.0000 mL | INTRAVENOUS | Status: DC | PRN

## 2013-10-29 MED ORDER — POTASSIUM CHLORIDE CRYS ER 20 MEQ PO TBCR
20.0000 meq | EXTENDED_RELEASE_TABLET | Freq: Once | ORAL | Status: AC
  Administered 2013-10-29: 20 meq via ORAL
  Filled 2013-10-29: qty 1

## 2013-10-29 MED ORDER — ASPIRIN EC 81 MG PO TBEC
81.0000 mg | DELAYED_RELEASE_TABLET | Freq: Every day | ORAL | Status: DC
  Administered 2013-10-30 – 2013-11-10 (×12): 81 mg via ORAL
  Filled 2013-10-29 (×12): qty 1

## 2013-10-29 MED ORDER — HYDRALAZINE HCL 25 MG PO TABS
37.5000 mg | ORAL_TABLET | Freq: Three times a day (TID) | ORAL | Status: DC
  Administered 2013-10-29 – 2013-10-31 (×6): 37.5 mg via ORAL
  Filled 2013-10-29 (×11): qty 1.5

## 2013-10-29 MED ORDER — ATORVASTATIN CALCIUM 20 MG PO TABS
20.0000 mg | ORAL_TABLET | Freq: Every day | ORAL | Status: DC
  Administered 2013-10-29 – 2013-11-09 (×11): 20 mg via ORAL
  Filled 2013-10-29 (×13): qty 1

## 2013-10-29 MED ORDER — INSULIN GLARGINE 100 UNIT/ML ~~LOC~~ SOLN
20.0000 [IU] | Freq: Every day | SUBCUTANEOUS | Status: DC
  Administered 2013-10-29 – 2013-10-30 (×2): 20 [IU] via SUBCUTANEOUS
  Administered 2013-10-31: 10 [IU] via SUBCUTANEOUS
  Filled 2013-10-29 (×6): qty 0.2

## 2013-10-29 MED ORDER — VITAMIN D 1000 UNITS PO TABS
2000.0000 [IU] | ORAL_TABLET | Freq: Every day | ORAL | Status: DC
  Administered 2013-10-30 – 2013-11-10 (×12): 2000 [IU] via ORAL
  Filled 2013-10-29 (×12): qty 2

## 2013-10-29 MED ORDER — SODIUM CHLORIDE 0.9 % IJ SOLN
3.0000 mL | Freq: Two times a day (BID) | INTRAMUSCULAR | Status: DC
  Administered 2013-10-30 – 2013-10-31 (×3): 3 mL via INTRAVENOUS

## 2013-10-29 MED ORDER — LEVOTHYROXINE SODIUM 75 MCG PO TABS
75.0000 ug | ORAL_TABLET | Freq: Every day | ORAL | Status: DC
  Administered 2013-10-30 – 2013-11-10 (×12): 75 ug via ORAL
  Filled 2013-10-29 (×13): qty 1

## 2013-10-29 MED ORDER — CARVEDILOL 12.5 MG PO TABS
12.5000 mg | ORAL_TABLET | Freq: Two times a day (BID) | ORAL | Status: DC
  Administered 2013-10-29 – 2013-11-10 (×22): 12.5 mg via ORAL
  Filled 2013-10-29 (×26): qty 1

## 2013-10-29 MED ORDER — NITROGLYCERIN 2 % TD OINT
1.0000 [in_us] | TOPICAL_OINTMENT | Freq: Four times a day (QID) | TRANSDERMAL | Status: DC
  Administered 2013-10-29 – 2013-10-31 (×7): 1 [in_us] via TOPICAL
  Filled 2013-10-29 (×2): qty 30
  Filled 2013-10-29: qty 1

## 2013-10-29 MED ORDER — ONDANSETRON HCL 4 MG/2ML IJ SOLN: 4.0000 mg | Freq: Four times a day (QID) | INTRAMUSCULAR | Status: DC | PRN

## 2013-10-29 MED ORDER — ACETAMINOPHEN 325 MG PO TABS: 650.0000 mg | ORAL_TABLET | ORAL | Status: DC | PRN

## 2013-10-29 MED ORDER — ACETAMINOPHEN 500 MG PO TABS
500.0000 mg | ORAL_TABLET | Freq: Four times a day (QID) | ORAL | Status: DC | PRN
Start: 2013-10-29 — End: 2013-11-01

## 2013-10-29 MED ORDER — INSULIN ASPART 100 UNIT/ML ~~LOC~~ SOLN: 0.0000 [IU] | Freq: Three times a day (TID) | SUBCUTANEOUS | Status: DC

## 2013-10-29 MED ORDER — FUROSEMIDE 10 MG/ML IJ SOLN
80.0000 mg | Freq: Once | INTRAMUSCULAR | Status: AC
  Administered 2013-10-29: 80 mg via INTRAVENOUS
  Filled 2013-10-29: qty 8

## 2013-10-29 NOTE — Telephone Encounter (Signed)
Pt called this afternoon stating that since her last visit with Dr. Delton See on 11/11, she has continued to gain weight with associated doe and lower extremity edema.  She has been altering her lasix dose between 40 mg bid and 80 mg bid to see if this would change her weight or edema, but unfortunately it hasn't.  Her weight is now in the 146-147 range, approximately 10 lbs above her previous discharge weight in September.  She also has CKD and is followed by nephrology.  I've recommended that given her progression of Ss and failure to respond to escalation of outpatient lasix dose, along with h/o CKD, that she should present to the ED today for evaluation and probable admission for IV diuresis.  She verbalized understanding and may come in this evening.

## 2013-10-29 NOTE — ED Provider Notes (Signed)
CSN: [id number]     Arrival date & time 10/29/13  1702 History   First MD Initiated Contact with Patient 10/29/13 1719     Chief Complaint  Patient presents with  . Chest Pain  . Shortness of Breath  . Congestive Heart Failure    HPI  Patient presents via ambulance from home. Has a history of CHF due to diastolic dysfunction. Had difficulty with a low sodium. Was previously a milligrams of Lasix twice a day. This was decreased because of low sodium a few weeks ago. She started to Fluid in weight. Worsening dyspnea. Orthopnea for the last week. Her dry weight is 133 she is 146 today. She is continuing to make urine but less so over the last few days more short of breath. Swelling in her arms. Swelling in her legs. Presents here  Past Medical History  Diagnosis Date  . Retinopathy   . Hypertension   . Chronic diastolic CHF (congestive heart failure)     has a normal EF per echo 03/2011 & 5/2013with diastolic dysfunction  . CAD (coronary artery disease)     Prior PCI in 1998 x 2; s/p cath in 2001, negative Myoview in 2008 & 2011, s/p cath in June 2012 with calcified disease of the proximal LCX with normal flow reserve. She has been managed medically due to her other morbidities  . Cerebrovascular disease   . Peripheral vascular disease     prior angiography showing RSFA stenosis, left anterior tibial and left posterior tibial stenoses  . Hypercholesterolemia   . Diabetes mellitus     insulin dependent  . Hypothyroidism   . Hiatal hernia   . Degenerative joint disease   . Osteoporosis   . Vitamin D deficiency   . Anemia of chronic disease   . Renal insufficiency   . Shortness of breath   . Anginal pain    Past Surgical History  Procedure Laterality Date  . Abdominal aortogram  07/27/2003    by Dr. Chales Abrahams  . Bilateral lower extremity angiogram  07/27/2003    by Dr. Chales Abrahams  . Selective angiography,right superficial femoral artery, right and left iliac arteries  07/27/2003    by Dr.  Chales Abrahams  . Measurement of gradient right and left iliac arteries  07/27/2003    by Dr. Chales Abrahams  . Bilateral cataract sugery  2000 and 2002    by Dr. Cecilie Kicks  . Repair left hip fracture  04/2008    by Dr. Charlann Boxer   Family History  Problem Relation Age of Onset  . Heart disease Mother    History  Substance Use Topics  . Smoking status: Former Smoker    Types: Cigarettes    Quit date: 12/07/1974  . Smokeless tobacco: Never Used     Comment: pt started smoking in 1956  . Alcohol Use: No   OB History   Grav Para Term Preterm Abortions TAB SAB Ect Mult Living                 Review of Systems  Constitutional: Negative for fever, chills, diaphoresis, appetite change and fatigue.  HENT: Negative for mouth sores, sore throat and trouble swallowing.   Eyes: Negative for visual disturbance.  Respiratory: Positive for chest tightness and shortness of breath. Negative for cough and wheezing.   Cardiovascular: Positive for leg swelling. Negative for chest pain.  Gastrointestinal: Negative for nausea, vomiting, abdominal pain, diarrhea and abdominal distention.  Endocrine: Negative for polydipsia, polyphagia and polyuria.  Genitourinary: Negative  for dysuria, frequency and hematuria.  Musculoskeletal: Negative for gait problem.  Skin: Negative for color change, pallor and rash.  Neurological: Negative for dizziness, syncope, light-headedness and headaches.  Hematological: Does not bruise/bleed easily.  Psychiatric/Behavioral: Negative for behavioral problems and confusion.    Allergies  Ibuprofen and Prednisone  Home Medications  No current outpatient prescriptions on file. BP 165/71  Pulse 76  Temp(Src) 97.9 F (36.6 C) (Oral)  Resp 20  Ht 5\' 4"  (1.626 m)  Wt 144 lb 6.4 oz (65.5 kg)  BMI 24.77 kg/m2  SpO2 97% Physical Exam  Constitutional: She is oriented to person, place, and time. She appears well-developed and well-nourished. No distress.  Sitting upright. Dyspneic with  conversation  HENT:  Head: Normocephalic.  Eyes: Conjunctivae are normal. Pupils are equal, round, and reactive to light. No scleral icterus.  Neck: Normal range of motion. Neck supple. No thyromegaly present.  No JVD  Cardiovascular: Normal rate, regular rhythm, S1 normal and S2 normal.  Exam reveals no gallop and no friction rub.   No murmur heard. Sinus rhythm  Pulmonary/Chest: Effort normal and breath sounds normal. No respiratory distress. She has no wheezes. She has no rales.  Decreased bibasilar breath sounds with crackles  Abdominal: Soft. Bowel sounds are normal. She exhibits no distension. There is no tenderness. There is no rebound.  Musculoskeletal: Normal range of motion.  Neurological: She is alert and oriented to person, place, and time.  Skin: Skin is warm and dry. No rash noted.  Has no extremity edema to the knee. 1+ below the knee. 1+ bilateral upper extremity edema  Psychiatric: She has a normal mood and affect. Her behavior is normal.    ED Course  Procedures (including critical care time) Labs Review Labs Reviewed  BASIC METABOLIC PANEL - Abnormal; Notable for the following:    Sodium 134 (*)    Glucose, Bld 401 (*)    BUN 52 (*)    Creatinine, Ser 2.01 (*)    GFR calc non Af Amer 23 (*)    GFR calc Af Amer 27 (*)    All other components within normal limits  PRO B NATRIURETIC PEPTIDE - Abnormal; Notable for the following:    Pro B Natriuretic peptide (BNP) 5412.0 (*)    All other components within normal limits  CBC  BASIC METABOLIC PANEL  TROPONIN I  TROPONIN I  TROPONIN I  MAGNESIUM  PROTIME-INR  CBC  CREATININE, SERUM  POCT I-STAT TROPONIN I   Imaging Review Dg Chest 2 View  10/29/2013   CLINICAL DATA:  Chest pain.  Shortness of breath.  Fluid overload.  EXAM: CHEST  2 VIEW  COMPARISON:  08/12/2013  FINDINGS: Mild cardiomegaly is stable. Increased diffuse interstitial infiltrates are seen, consistent with interstitial edema. Small bilateral  pleural effusions of also increased in size since previous study.  IMPRESSION: Worsening congestive heart failure with increased small bilateral pleural effusions.   Electronically Signed   By: Myles Rosenthal M.D.   On: 10/29/2013 18:16    EKG Interpretation    Date/Time:  Sunday October 29 2013 17:14:41 EST Ventricular Rate:  69 PR Interval:  176 QRS Duration: 93 QT Interval:  468 QTC Calculation: 501 R Axis:   64 Text Interpretation:  Sinus Rhythm. No ischemia or infarct. No ectopy Confirmed by Fayrene Fearing  MD, Lexxus Underhill ([id number]) on 10/29/2013 11:16:08 PM            MDM   1. Congestive heart failure    EKG  shows no acute findings. Suicidal and 27. Discussed case with cardiology. She is given a buddy Lasix here. His given nitro paste 1 inch here. Admission.    Roney Marion, MD 10/29/13 [phone]

## 2013-10-29 NOTE — ED Notes (Signed)
Pt reports she has had increased SOB especially with exertion and increased swelling. sts last week she had a change in her lasix and just feels she hasn't been able to pee as much. Pt in nad, skin warm and dry, resp e/u.

## 2013-10-29 NOTE — ED Notes (Signed)
Pt placed on 2 liters/min oxygen with nasal canula.

## 2013-10-29 NOTE — ED Notes (Signed)
Pt returned from radiology.

## 2013-10-29 NOTE — ED Notes (Signed)
Per EMS - pt coming from home. Over the last 3 days, pt c/o sob on exertion and chest pressure. Pitting edema 3+, hx of CHF. Last week, pt saw cardiologist and had her lasix reduced d/t low sodium levels. This morning the chest pressure was worse than normal so she took 1 Nitro. Pt reports she has decreased urine output and noticed smell to the urine. Pt concerned about kidney functions. 20 G in right forearm. 4 liters/min, 97%. Doesn't normally wear O2 at home. 12 lead NSR. Rales heard in lower lungs. Pt able to speak in full sentences but then takes a deep breath after speaking. ems administered 324 ASA, and 3 nitro, chest pressure went from a 10/10 to a 8/10. BP 156/50. HR 70. CBG 411. Pt was here in september for the same thing and stayed for 10 days. Nad, skin warm and dry, resp e/u.

## 2013-10-29 NOTE — Progress Notes (Signed)
Pt admitted to 4E07 at 22:00 from ER, came by stretcher, family member at the bedside, pt c/o of some chest pressure 4/10 and some SOB, VSS at this time. Pt and family member oriented to the unit and to her room, HF education started. Fall precaution in place, pt oriented to call for assistance to get OOB to prevent any fall or injury while in the hospital. Medications given as ordered, we'll continue with POC.

## 2013-10-29 NOTE — H&P (Signed)
Nicole [name] is an 78 y.o. female.   Chief Complaint: Dyspnea on Exertion/SOB HPI: [name] is a 78 yo woman with PMH of CAD, chronic diastolic heart failure with close follow-up in clinic and recent hospitalization for HF, peripheral vascular disease, hypothyroidism, chronic kidney disease who was last seen 11/11 with Dr. Delton See with further titration of her lasix (most recently 80 mg po bid) but her weight has increased to 147 lbs with dry weight of 133 lbs. Given continued symptoms she called in and was recommended to come to the ER for further evaluation and likely admission. In the ER she was noted to have weight gain, pulmonary edema on chest x-ray and elevated BNP and she was given 80 mg IV lasix. She tells me she didn't put out much urine from the 80 mg. She also feels full in her abdomen and upper legs. + medication compliance, no recent travel/fever/chills or sick contacts.    Past Medical History  Diagnosis Date  . Retinopathy   . Hypertension   . Chronic diastolic CHF (congestive heart failure)     has a normal EF per echo 03/2011 & 5/2013with diastolic dysfunction  . CAD (coronary artery disease)     Prior PCI in 1998 x 2; s/p cath in 2001, negative Myoview in 2008 & 2011, s/p cath in June 2012 with calcified disease of the proximal LCX with normal flow reserve. She has been managed medically due to her other morbidities  . Cerebrovascular disease   . Peripheral vascular disease     prior angiography showing RSFA stenosis, left anterior tibial and left posterior tibial stenoses  . Hypercholesterolemia   . Diabetes mellitus     insulin dependent  . Hypothyroidism   . Hiatal hernia   . Degenerative joint disease   . Osteoporosis   . Vitamin D deficiency   . Anemia of chronic disease   . Renal insufficiency   . Shortness of breath   . Anginal pain     Past Surgical History  Procedure Laterality Date  . Abdominal aortogram  07/27/2003    by Dr. Chales Abrahams  . Bilateral  lower extremity angiogram  07/27/2003    by Dr. Chales Abrahams  . Selective angiography,right superficial femoral artery, right and left iliac arteries  07/27/2003    by Dr. Chales Abrahams  . Measurement of gradient right and left iliac arteries  07/27/2003    by Dr. Chales Abrahams  . Bilateral cataract sugery  2000 and 2002    by Dr. Cecilie Kicks  . Repair left hip fracture  04/2008    by Dr. Charlann Boxer    Family History  Problem Relation Age of Onset  . Heart disease Mother    Social History:  reports that she quit smoking about 38 years ago. Her smoking use included Cigarettes. She smoked 0.00 packs per day. She has never used smokeless tobacco. She reports that she does not drink alcohol or use illicit drugs.  Allergies:  Allergies  Allergen Reactions  . Ibuprofen     REACTION: edema---thyroid problems  . Prednisone     Causes severe fluid retention     (Not in a hospital admission)  Results for orders placed during the hospital encounter of 10/29/13 (from the past 48 hour(s))  CBC     Status: None   Collection Time    10/29/13  5:19 PM      Result Value Range   WBC 6.2  4.0 - 10.5 K/uL   RBC 3.88  3.87 -  5.11 MIL/uL   Hemoglobin 12.4  12.0 - 15.0 g/dL   HCT 13.0  86.5 - 78.4 %   MCV 93.3  78.0 - 100.0 fL   MCH 32.0  26.0 - 34.0 pg   MCHC 34.3  30.0 - 36.0 g/dL   RDW 69.6  29.5 - 28.4 %   Platelets 217  150 - 400 K/uL  BASIC METABOLIC PANEL     Status: Abnormal   Collection Time    10/29/13  5:19 PM      Result Value Range   Sodium 134 (*) 135 - 145 mEq/L   Potassium 3.9  3.5 - 5.1 mEq/L   Chloride 97  96 - 112 mEq/L   CO2 20  19 - 32 mEq/L   Glucose, Bld 401 (*) 70 - 99 mg/dL   BUN 52 (*) 6 - 23 mg/dL   Creatinine, Ser 1.32 (*) 0.50 - 1.10 mg/dL   Calcium 8.6  8.4 - 44.0 mg/dL   GFR calc non Af Amer 23 (*) >90 mL/min   GFR calc Af Amer 27 (*) >90 mL/min   Comment: (NOTE)     The eGFR has been calculated using the CKD EPI equation.     This calculation has not been validated in all clinical  situations.     eGFR's persistently <90 mL/min signify possible Chronic Kidney     Disease.  PRO B NATRIURETIC PEPTIDE     Status: Abnormal   Collection Time    10/29/13  5:19 PM      Result Value Range   Pro B Natriuretic peptide (BNP) 5412.0 (*) 0 - 450 pg/mL  POCT I-STAT TROPONIN I     Status: None   Collection Time    10/29/13  6:43 PM      Result Value Range   Troponin i, poc 0.03  0.00 - 0.08 ng/mL   Comment 3            Comment: Due to the release kinetics of cTnI,     a negative result within the first hours     of the onset of symptoms does not rule out     myocardial infarction with certainty.     If myocardial infarction is still suspected,     repeat the test at appropriate intervals.   Dg Chest 2 View  10/29/2013   CLINICAL DATA:  Chest pain.  Shortness of breath.  Fluid overload.  EXAM: CHEST  2 VIEW  COMPARISON:  08/12/2013  FINDINGS: Mild cardiomegaly is stable. Increased diffuse interstitial infiltrates are seen, consistent with interstitial edema. Small bilateral pleural effusions of also increased in size since previous study.  IMPRESSION: Worsening congestive heart failure with increased small bilateral pleural effusions.   Electronically Signed   By: Myles Rosenthal M.D.   On: 10/29/2013 18:16    Review of Systems  Constitutional: Negative for fever and chills.  HENT: Negative for ear pain.   Eyes: Negative for double vision and photophobia.  Respiratory: Positive for shortness of breath. Negative for cough and hemoptysis.   Cardiovascular: Positive for chest pain, orthopnea, leg swelling and PND. Negative for palpitations.  Gastrointestinal: Positive for heartburn. Negative for nausea, vomiting and diarrhea.  Musculoskeletal: Negative for falls and myalgias.  Skin: Negative for rash.  Neurological: Positive for dizziness. Negative for tingling, tremors and headaches.  Endo/Heme/Allergies: Negative for environmental allergies.  Psychiatric/Behavioral: Negative  for suicidal ideas and substance abuse.    Blood pressure 173/38, pulse 71, temperature  98 F (36.7 C), temperature source Oral, resp. rate 21, height 5\' 4"  (1.626 m), weight 66.225 kg (146 lb), SpO2 95.00%. Physical Exam  Nursing note and vitals reviewed. Constitutional: She is oriented to person, place, and time. She appears well-developed and well-nourished. She appears distressed.  Mildly uncomfortable  HENT:  Head: Normocephalic and atraumatic.  Nose: Nose normal.  Mouth/Throat: Oropharynx is clear and moist. No oropharyngeal exudate.  Eyes: Conjunctivae and EOM are normal. Pupils are equal, round, and reactive to light. No scleral icterus.  Neck: Normal range of motion. Neck supple. JVD present. No tracheal deviation present.  Cardiovascular: Normal rate and regular rhythm.  Exam reveals no gallop.   Murmur heard. Systolic murmur at LSB  Respiratory: Effort normal. She has rales.  Bibasilar rales  GI: Soft. Bowel sounds are normal. She exhibits distension. There is no tenderness. There is no rebound.  Mildly full abdomen  Musculoskeletal: Normal range of motion. She exhibits edema.  Neurological: She is alert and oriented to person, place, and time. Coordination normal.  Skin: Skin is warm and dry. No rash noted. She is not diaphoretic. No erythema.  Psychiatric: She has a normal mood and affect. Her behavior is normal. Thought content normal.    Labs reviewed; na 134, K 3.9, bun/cr 52/2.01, glucose 401, wbc 6.2, h/h 12.4/36 ECG: HR 60s, lateral ST flattening/depressions, poor R wave progression ProBNP 5400s Chest x-ray: small effusions bilaterally and pulmonary edema Echo reviewed 9/14 - EF 65-70%, grade II DD, biatrial enlargement  Problem List Shortness of breath/Dyspnea on Exertion Acute on chronic heart failure with preserved ejection fraction T2DM on insulin with hyperglycemia today Stage IV chronic kidney disease Borderline hyponatremia Pulmonary edema on chest  x-ray Elevated BNP Known CAD Significant Hypertension  Assessment/Plan 78 yo woman with PMH of CKD stage IV, T2DM on insulin, known CAD, acute on chronic HFpEF here with pulmonary edema, weight gain and dyspnea. She received IV lasix in the ER but not UOP. She feels warm on exam and clinically appears warm/wet.  - continue IV lasix --> 120 mg IV x1 then 10 mg/hr gtt; if still refractory may need to consider RHC for hemodynamics/filling pressures - follow electrolytes closely - admit to tele, likely 2-3 days of diuresis - Blood pressure control --> continued multiple antihypertensives at home  Emmert Roethler 10/29/2013, 8:39 PM

## 2013-10-30 ENCOUNTER — Telehealth: Payer: Self-pay | Admitting: Pulmonary Disease

## 2013-10-30 DIAGNOSIS — D638 Anemia in other chronic diseases classified elsewhere: Secondary | ICD-10-CM

## 2013-10-30 DIAGNOSIS — I739 Peripheral vascular disease, unspecified: Secondary | ICD-10-CM

## 2013-10-30 DIAGNOSIS — I2510 Atherosclerotic heart disease of native coronary artery without angina pectoris: Secondary | ICD-10-CM

## 2013-10-30 DIAGNOSIS — N183 Chronic kidney disease, stage 3 unspecified: Secondary | ICD-10-CM

## 2013-10-30 DIAGNOSIS — I509 Heart failure, unspecified: Secondary | ICD-10-CM

## 2013-10-30 DIAGNOSIS — I5031 Acute diastolic (congestive) heart failure: Secondary | ICD-10-CM

## 2013-10-30 DIAGNOSIS — N179 Acute kidney failure, unspecified: Secondary | ICD-10-CM

## 2013-10-30 DIAGNOSIS — R609 Edema, unspecified: Secondary | ICD-10-CM

## 2013-10-30 LAB — GLUCOSE, CAPILLARY
Glucose-Capillary: 302 mg/dL — ABNORMAL HIGH (ref 70–99)
Glucose-Capillary: 288 mg/dL — ABNORMAL HIGH (ref 70–99)
Glucose-Capillary: 198 mg/dL — ABNORMAL HIGH (ref 70–99)
Glucose-Capillary: 185 mg/dL — ABNORMAL HIGH (ref 70–99)

## 2013-10-30 LAB — TROPONIN I: Troponin I: <0.3 ng/mL (ref <0.30)

## 2013-10-30 LAB — BASIC METABOLIC PANEL
CO2: 18 mEq/L — ABNORMAL LOW (ref 19–32)
Calcium: 8.9 mg/dL (ref 8.4–10.5)
Chloride: 99 mEq/L (ref 96–112)
Creatinine, Ser: 2.03 mg/dL — ABNORMAL HIGH (ref 0.50–1.10)
Glucose, Bld: 317 mg/dL — ABNORMAL HIGH (ref 70–99)
Potassium: 3.8 mEq/L (ref 3.5–5.1)
Sodium: 136 mEq/L (ref 135–145)

## 2013-10-30 LAB — PROTIME-INR
INR: 1.03 (ref 0.00–1.49)
Prothrombin Time: 13.3 seconds (ref 11.6–15.2)

## 2013-10-30 LAB — CREATININE, SERUM
Creatinine, Ser: 2.01 mg/dL — ABNORMAL HIGH (ref 0.50–1.10)
GFR calc Af Amer: 27 mL/min — ABNORMAL LOW (ref >90)
GFR calc non Af Amer: 23 mL/min — ABNORMAL LOW (ref >90)

## 2013-10-30 MED ORDER — FUROSEMIDE 10 MG/ML IJ SOLN
120.0000 mg | Freq: Once | INTRAVENOUS | Status: AC
  Administered 2013-10-30: 120 mg via INTRAVENOUS
  Filled 2013-10-30: qty 12

## 2013-10-30 MED ORDER — FUROSEMIDE 10 MG/ML IJ SOLN
30.0000 mg/h | INTRAVENOUS | Status: DC
  Administered 2013-10-30 (×2): 30 mg/h via INTRAVENOUS
  Administered 2013-10-30: 10 mg/h via INTRAVENOUS
  Administered 2013-10-31 – 2013-11-02 (×6): 30 mg/h via INTRAVENOUS
  Filled 2013-10-30 (×17): qty 25

## 2013-10-30 MED ORDER — INSULIN ASPART 100 UNIT/ML ~~LOC~~ SOLN
0.0000 [IU] | Freq: Three times a day (TID) | SUBCUTANEOUS | Status: DC
  Administered 2013-10-30: 07:00:00 7 [IU] via SUBCUTANEOUS
  Administered 2013-10-30: 5 [IU] via SUBCUTANEOUS
  Administered 2013-10-30: 2 [IU] via SUBCUTANEOUS
  Administered 2013-11-01: 3 [IU] via SUBCUTANEOUS
  Administered 2013-11-01 (×2): 2 [IU] via SUBCUTANEOUS
  Administered 2013-11-02 (×2): 3 [IU] via SUBCUTANEOUS

## 2013-10-30 MED ORDER — METOLAZONE 5 MG PO TABS
5.0000 mg | ORAL_TABLET | Freq: Every day | ORAL | Status: AC
  Administered 2013-10-30: 5 mg via ORAL
  Filled 2013-10-30: qty 1

## 2013-10-30 MED ORDER — METOLAZONE 5 MG PO TABS
5.0000 mg | ORAL_TABLET | Freq: Every day | ORAL | Status: DC
  Administered 2013-10-30 – 2013-11-01 (×3): 5 mg via ORAL
  Filled 2013-10-30 (×5): qty 1

## 2013-10-30 NOTE — Care Management Note (Addendum)
    Page 1 of 2   11/01/2013     1:46:36 PM   CARE MANAGEMENT NOTE 11/01/2013  Patient:  Nicole [name], Nicole [name]   Account Number:  [id number]  Date Initiated:  10/30/2013  Documentation initiated by:  [facility]  Subjective/Objective Assessment:   78 y.o. female.Chief Complaint:Dyspnea on Exertion/SOB.//Home alone- sons nearby     Action/Plan:   diurese gtts// home with home health   Anticipated DC Date:  11/02/2013   Anticipated DC Plan:  HOME W HOME HEALTH SERVICES  In-house referral  Clinical Social Worker      DC Associate Professor  CM consult      [facility] Choice  HOME HEALTH   Choice offered to / List presented to:  C-1 Patient        HH arranged  HH-1 RN  HH-10 DISEASE MANAGEMENT      HH agency  [facility]   Status of service:  Completed, signed off Medicare Important Message given?   (If response is "NO", the following Medicare IM given date fields will be blank) Date Medicare IM given:   Date Additional Medicare IM given:    Discharge Disposition:  HOME W HOME HEALTH SERVICES  Per UR Regulation:    If discussed at Long Length of Stay Meetings, dates discussed:    Comments:  10/30/13 Oletta Cohn, RN, BSN, NCM [phone] Spoke with pt. regarding discharge planning. CM offered pt. list of home health agencies. Pt. chose [facility] to render services. Hilda Lias of [facility] notified. No DME needs identified at this time.

## 2013-10-30 NOTE — Progress Notes (Addendum)
Patient Name: Nicole [name] Date of Encounter: 10/30/2013  Principal Problem:   Acute on chronic diastolic CHF (congestive heart failure) Active Problems:   HYPOTHYROIDISM   HYPERCHOLESTEROLEMIA   HYPERTENSION   CAD   GERD (gastroesophageal reflux disease)   Chronic kidney disease, stage III (moderate)   T2DM (type 2 diabetes mellitus)   Length of Stay: 1  SUBJECTIVE  The patient feels very uncomfortable, SOB at rest with 60 degrees incline and gasping for breath while talking.  CURRENT MEDS . amLODipine  10 mg Oral Daily  . aspirin EC  81 mg Oral Daily  . atorvastatin  20 mg Oral q1800  . carvedilol  12.5 mg Oral BID WC  . cholecalciferol  2,000 Units Oral Daily  . cloNIDine  0.1 mg Oral Q2200  . heparin  5,000 Units Subcutaneous Q8H  . hydrALAZINE  37.5 mg Oral TID  . insulin aspart  0-9 Units Subcutaneous TID WC  . insulin glargine  20 Units Subcutaneous QHS  . isosorbide mononitrate  20 mg Oral BID  . levothyroxine  75 mcg Oral QAC breakfast  . nitroGLYCERIN  1 inch Topical Q6H  . pantoprazole  40 mg Oral Daily  . sodium chloride  3 mL Intravenous Q12H   . furosemide (LASIX) infusion 10 mg/hr (10/30/13 0129)     OBJECTIVE  Filed Vitals:   10/29/13 2115 10/29/13 2209 10/30/13 0222 10/30/13 0512  BP:  165/71 135/43 145/38  Pulse: 70 76 62 61  Temp:  97.9 F (36.6 C) 98.4 F (36.9 C) 98.1 F (36.7 C)  TempSrc:  Oral Oral Oral  Resp: 15 20 20 20   Height:  5\' 4"  (1.626 m)    Weight:  144 lb 6.4 oz (65.5 kg)  144 lb 8 oz (65.545 kg)  SpO2: 96% 97% 93% 95%    Intake/Output Summary (Last 24 hours) at 10/30/13 0829 Last data filed at 10/29/13 2343  Gross per 24 hour  Intake    200 ml  Output    150 ml  Net     50 ml   Filed Weights   10/29/13 1717 10/29/13 2209 10/30/13 0512  Weight: 146 lb (66.225 kg) 144 lb 6.4 oz (65.5 kg) 144 lb 8 oz (65.545 kg)    PHYSICAL EXAM  General: Pleasant, NAD. Neuro: Alert and oriented X 3. Moves all  extremities spontaneously. Psych: Normal affect. HEENT:  Normal  Neck: Supple without bruits, JVD + 8 cm. Lungs:  Resp regular and unlabored, crackles up to the mid lungs B/L Heart: RRR no s3, s4, or murmurs. Abdomen: Soft, non-tender, non-distended, BS + x 4.  Extremities: No clubbing, cyanosis, edema B/L entire lower extremity, very tight tender skin. Radials 2+, DP/DT non-pulpable B/L.  Accessory Clinical Findings  CBC  Recent Labs  10/29/13 1719 10/29/13 2320  WBC 6.2 5.5  HGB 12.4 11.9*  HCT 36.2 35.8*  MCV 93.3 95.7  PLT 217 193   Basic Metabolic Panel  Recent Labs  10/29/13 1719 10/29/13 2320 10/30/13 0610  NA 134*  --  136  K 3.9  --  3.8  CL 97  --  99  CO2 20  --  18*  GLUCOSE 401*  --  317*  BUN 52*  --  54*  CREATININE 2.01* 2.01* 2.03*  CALCIUM 8.6  --  8.9  MG  --   --  2.2   Cardiac Enzymes  Recent Labs  10/29/13 2320 10/30/13 0610  TROPONINI <0.30 <0.30  BNP (last 3 results)  Recent Labs  02/13/13 1145 02/14/13 0540 10/29/13 1719  PROBNP 2031.0* 1703.0* 5412.0*   TTE 08/13/2013 Study Conclusions  - Left ventricle: The cavity size was normal. Systolic function was vigorous. The estimated ejection fraction was in the range of 65% to 70%. Wall motion was normal; there were no regional wall motion abnormalities. Features are consistent with a pseudonormal left ventricular filling pattern, with concomitant abnormal relaxation and increased filling pressure (grade 2 diastolic dysfunction). - Mitral valve: Calcified annulus. Mild regurgitation. - Left atrium: The atrium was mildly dilated. - Right atrium: The atrium was mildly dilated. Impressions: - Compared to the prior study, there has been no significant interval change.   Left heart cath 05/08/2011   CONCLUSION:   1. Well-known preserved left ventricular systolic function.  2. Calcified disease of the proximal circumflex coronary artery which  is a dominant vessel of  uncertain severity with normal flow  characteristics in a patient with stable symptoms.   DISPOSITION: Based upon the findings of the flow wire, a continued  medical therapy would be recommended. The patient was at moderately  high risk. She is an insulin-dependent diabetic that would likely  require drug-eluting platform. She has a baseline hemoglobin of about  10 today, and she has had a history of proliferative retinopathy as well  as some rectal bleeding on antiplatelet therapy. As she is stable  without current symptoms, a continued approach will be recommended.  Arturo Morton. Riley Kill, MD, [facility]    Radiology/Studies  Dg Chest 2 View  10/29/2013   CLINICAL DATA:  Chest pain.  Shortness of breath.  Fluid overload.  EXAM: CHEST  2 VIEW  COMPARISON:  08/12/2013  FINDINGS: Mild cardiomegaly is stable. Increased diffuse interstitial infiltrates are seen, consistent with interstitial edema. Small bilateral pleural effusions of also increased in size since previous study.  IMPRESSION: Worsening congestive heart failure with increased small bilateral pleural effusions.  TELE:  Sinus bradycardia , 55-60 BPM  ECG SR, normal ECG    ASSESSMENT AND PLAN  78 yo woman with PMH of CKD stage IV, T2DM on insulin, PAD, hypertension, known CAD, acute on chronic HFpEF here with pulmonary edema.  1. Acute on chronic heart failure with preserved ejection fraction  Admitted for weight gain and DOE, found to be in pulmonary edema, she was hospitalized in September and followed every 2 weeks in the clinic since then. She has never reached comfortable state, always SOB, currently at rest NYHA IV, minimal diuresis on high dose Lasix in the settings of CKD stage 4. Started on Lasix drip yesterday, minimal diuresis or symptoms improvement. She feels warm on exam and clinically appears warm/wet.   Plan: right sided cath, consult to HF team for consideration of therapy with inotropes - possibly might benefit from  Dopamin for improvement of her kidney perfusion  2. Resistant hypertension - currently on   Amlodipine 10 mg QD  Coreg 12.5 mg BID  Clonidine 0.1 mg at night (she feels tired if she takes it in the morning)  ISDN 20 mg PO Q8H with Hydralazine 37.5 PO Q8H  - hold ACEI - bad AKI after initiation   3. Acute on chronic kidney failure, stage IV  4. Fatigue - might be related to her normocytic anemia, most probably of chronic diseases. The patient is being treated with Procrit injections (epoetin).   5. CAD - chest pain free,  - medical management, the last cath on 05/08/2011 with heavily calcified proximal LCx  lesion (dominant vessel), FFR showed non-significant stenosis, plus high risk of bleeding, medical management was recommended  6. Peripheral arterial disease - B/L, not significant claudications, possible intervention once kidney function is improved   7. NIDDM - with diabetic proliferative retinopathy - stable   Signed, Tobias Alexander, H MD, [facility] 10/30/2013

## 2013-10-30 NOTE — Progress Notes (Signed)
Afternoon rounding note:  Pt not having much UOP with increase in lasix to 30 mg/hr and metolazone 5 mg. She does have marked volume overload. Urine sodium 76. Will give another dose of 5 mg metolazone and leave lasix gtt at current rate. If she does not start urinating more, will need to start renal dose dopamine and possibly have another RHC to assess pressures. Concerned that she may be nearing the time for HD. Will also place TED hose.    Ulla Potash B NP-C 4:37 PM

## 2013-10-30 NOTE — Progress Notes (Signed)
Patient evaluated for community based chronic disease management services with [facility] as a benefit of patient's [employer]. Spoke with patient at bedside to explain [facility] Care Management services.  Patient will receive a post discharge transition of care call and will be evaluated for monthly home visits for assessments and CHF disease process education.  Patient has a scale but does not weigh daily and she does not have a firm understanding of early symptom management/recognition.  This is evident by her indicating that she saw her weight increasing on the scale prior to admission but she did not call her PCP because she was afraid that the only treatment option was to be admitted to the hospital.  Written consents obtained.  Left contact information and [facility] literature at bedside. Made Inpatient Case Manager aware that [facility] Care Management following. Of note, [facility] Care Management services does not replace or interfere with any services that are arranged by inpatient case management or social work.  For additional questions or referrals please contact Anibal Henderson BSN RN [facility] Liaison at [phone].

## 2013-10-30 NOTE — Progress Notes (Signed)
Utilization Review Completed.Nicole [name] T11/24/2014  

## 2013-10-30 NOTE — Telephone Encounter (Signed)
SN is aware. 

## 2013-10-30 NOTE — Progress Notes (Signed)
Pt with foley catheter as ordered, Lasix gtt as ordered at 1:30 am. Pt reassess now, only 20 cc out put on the bag at this time. MD notified, order to continue to monitor gotten.

## 2013-10-30 NOTE — Telephone Encounter (Signed)
FYI - Pt admitted to [facility] on 10/29/13

## 2013-10-30 NOTE — Progress Notes (Signed)
Physical Therapy Evaluation Patient Details Name: Nicole [name] MRN: [id number] DOB: [birth year] Today's Date: 10/30/2013 Time: 4098-1191 PT Time Calculation (min): 15 min  PT Assessment / Plan / Recommendation History of Present Illness  Pt admit with CHF.   Clinical Impression  Pt admitted with above. Pt currently with functional limitations due to the deficits listed below (see PT Problem List). Pt will most likely need a ST NHP stay.  Limited eval secondary to pt refused to get OOB.   Pt will benefit from skilled PT to increase their independence and safety with mobility to allow discharge to the venue listed below.     PT Assessment  Patient needs continued PT services    Follow Up Recommendations  SNF;Supervision/Assistance - 24 hour        Barriers to Discharge Decreased caregiver support      Equipment Recommendations  None recommended by PT    Recommendations for Other Services     Frequency Min 3X/week    Precautions / Restrictions Precautions Precautions: Fall Restrictions Weight Bearing Restrictions: No   Pertinent Vitals/Pain VSS, No pain      Mobility  Bed Mobility Bed Mobility: Rolling Right;Rolling Left;Scooting to HOB Rolling Right: 4: Min assist;With rail Rolling Left: 4: Min assist;With rail Scooting to [facility]: 3: Mod assist Details for Bed Mobility Assistance: AA/ROM [facility] UE and LE  Transfers Transfers: Not assessed Ambulation/Gait Ambulation/Gait Assistance: Not tested (comment) Stairs: No Wheelchair Mobility Wheelchair Mobility: No         PT Diagnosis: Generalized weakness  PT Problem List: Decreased strength;Decreased mobility;Decreased activity tolerance;Decreased balance;Decreased knowledge of use of DME;Decreased safety awareness;Decreased knowledge of precautions PT Treatment Interventions: DME instruction;Functional mobility training;Therapeutic activities;Balance training;Patient/family education;Gait training     PT  Goals(Current goals can be found in the care plan section) Acute Rehab PT Goals Patient Stated Goal: to get better PT Goal Formulation: With patient Time For Goal Achievement: 11/06/13 Potential to Achieve Goals: Fair  Visit Information  Last PT Received On: 10/30/13 Assistance Needed: +2 History of Present Illness: Pt admit with CHF.        Prior Functioning  Home Living Family/patient expects to be discharged to:: Private residence Living Arrangements: Alone Available Help at Discharge: Family;Available PRN/intermittently Type of Home: House Home Access: Stairs to enter [employer] of Steps: 1 Entrance Stairs-Rails: Right Home Layout: One level Home Equipment: Cane - single point;Walker - 2 wheels;Bedside commode;Tub bench Prior Function Level of Independence: Independent with assistive device(s) Comments: pt uses cane, still drives and cares for dog Communication Communication: No difficulties Dominant Hand: Right    Cognition  Cognition Arousal/Alertness: Awake/alert Behavior During Therapy: WFL for tasks assessed/performed Overall Cognitive Status: Within Functional Limits for tasks assessed    Extremity/Trunk Assessment Upper Extremity Assessment Upper Extremity Assessment: Defer to OT evaluation Lower Extremity Assessment Lower Extremity Assessment: RLE deficits/detail;LLE deficits/detail RLE Deficits / Details: grossly 3/5  LLE Deficits / Details: grossly 3/5 Cervical / Trunk Assessment Cervical / Trunk Assessment: Normal   Balance    End of Session PT - End of Session Equipment Utilized During Treatment: Oxygen Activity Tolerance: Patient limited by fatigue Patient left: in bed;with call bell/phone within reach Nurse Communication: Mobility status       INGOLD,Cerenity Goshorn 10/30/2013, 4:53 PM [facility] Acute Rehabilitation [phone] (pager)

## 2013-10-30 NOTE — Progress Notes (Signed)
Report given to receiving RN. Patient is stable with no verbal complaints. No apparent signs or symptoms of distress or discomfort. 

## 2013-10-31 ENCOUNTER — Encounter (HOSPITAL_COMMUNITY)
Admission: EM | Disposition: A | Discharge status: Home Health | Payer: Self-pay | Source: Home / Self Care | Attending: Cardiology

## 2013-10-31 ENCOUNTER — Ambulatory Visit: Payer: Medicare Other | Admitting: Pulmonary Disease

## 2013-10-31 DIAGNOSIS — I509 Heart failure, unspecified: Secondary | ICD-10-CM

## 2013-10-31 HISTORY — PX: RIGHT HEART CATHETERIZATION: SHX5447

## 2013-10-31 LAB — POCT I-STAT 3, VENOUS BLOOD GAS (G3P V)
Acid-base deficit: 3 mmol/L — ABNORMAL HIGH (ref 0.0–2.0)
Acid-base deficit: 3 mmol/L — ABNORMAL HIGH (ref 0.0–2.0)
Bicarbonate: 22 mEq/L (ref 20.0–24.0)
Bicarbonate: 22.2 mEq/L (ref 20.0–24.0)
O2 Saturation: 70 %
O2 Saturation: 73 %
TCO2: 23 mmol/L (ref 0–100)
TCO2: 23 mmol/L (ref 0–100)
pCO2, Ven: 39.8 mmHg — ABNORMAL LOW (ref 45.0–50.0)
pCO2, Ven: 40.3 mmHg — ABNORMAL LOW (ref 45.0–50.0)
pH, Ven: 7.349 — ABNORMAL HIGH (ref 7.250–7.300)
pH, Ven: 7.351 — ABNORMAL HIGH (ref 7.250–7.300)
pO2, Ven: 39 mmHg (ref 30.0–45.0)
pO2, Ven: 41 mmHg (ref 30.0–45.0)

## 2013-10-31 LAB — CLOSTRIDIUM DIFFICILE BY PCR: Toxigenic C. Difficile by PCR: NEGATIVE

## 2013-10-31 LAB — POCT I-STAT 3, ART BLOOD GAS (G3+)
Acid-base deficit: 3 mmol/L — ABNORMAL HIGH (ref 0.0–2.0)
Bicarbonate: 21.1 mEq/L (ref 20.0–24.0)
O2 Saturation: 91 %
TCO2: 22 mmol/L (ref 0–100)
pCO2 arterial: 35.3 mmHg (ref 35.0–45.0)
pH, Arterial: 7.385 (ref 7.350–7.450)
pO2, Arterial: 61 mmHg — ABNORMAL LOW (ref 80.0–100.0)

## 2013-10-31 LAB — CBC
Hemoglobin: 12.6 g/dL (ref 12.0–15.0)
MCH: 31.7 pg (ref 26.0–34.0)
MCHC: 33.6 g/dL (ref 30.0–36.0)
Platelets: 245 10*3/uL (ref 150–400)

## 2013-10-31 LAB — GLUCOSE, CAPILLARY
Glucose-Capillary: 47 mg/dL — ABNORMAL LOW (ref 70–99)
Glucose-Capillary: 71 mg/dL (ref 70–99)
Glucose-Capillary: 131 mg/dL — ABNORMAL HIGH (ref 70–99)
Glucose-Capillary: 268 mg/dL — ABNORMAL HIGH (ref 70–99)

## 2013-10-31 LAB — BASIC METABOLIC PANEL
BUN: 58 mg/dL — ABNORMAL HIGH (ref 6–23)
Calcium: 8.9 mg/dL (ref 8.4–10.5)
Creatinine, Ser: 2.37 mg/dL — ABNORMAL HIGH (ref 0.50–1.10)
GFR calc Af Amer: 22 mL/min — ABNORMAL LOW (ref >90)
GFR calc non Af Amer: 19 mL/min — ABNORMAL LOW (ref >90)
Potassium: 3.2 mEq/L — ABNORMAL LOW (ref 3.5–5.1)

## 2013-10-31 LAB — CREATININE, SERUM
Creatinine, Ser: 2.1 mg/dL — ABNORMAL HIGH (ref 0.50–1.10)
GFR calc non Af Amer: 22 mL/min — ABNORMAL LOW (ref >90)

## 2013-10-31 LAB — MRSA PCR SCREENING: MRSA by PCR: NEGATIVE

## 2013-10-31 SURGERY — RIGHT HEART CATH
Anesthesia: LOCAL

## 2013-10-31 MED ORDER — SODIUM CHLORIDE 0.9 % IJ SOLN: 3.0000 mL | INTRAMUSCULAR | Status: DC | PRN

## 2013-10-31 MED ORDER — DOPAMINE-DEXTROSE 3.2-5 MG/ML-% IV SOLN
3.0000 ug/kg/min | INTRAVENOUS | Status: DC
  Administered 2013-10-31: 2.976 ug/kg/min via INTRAVENOUS

## 2013-10-31 MED ORDER — SODIUM CHLORIDE 0.9 % IV SOLN: 250.0000 mL | INTRAVENOUS | Status: DC | PRN

## 2013-10-31 MED ORDER — GLUCOSE 40 % PO GEL
ORAL | Status: AC
  Administered 2013-10-31: 37.5 g
  Filled 2013-10-31: qty 1

## 2013-10-31 MED ORDER — SODIUM CHLORIDE 0.9 % IV SOLN
250.0000 mL | INTRAVENOUS | Status: DC | PRN
  Administered 2013-11-08: 500 mL via INTRAVENOUS

## 2013-10-31 MED ORDER — SODIUM CHLORIDE 0.9 % IJ SOLN: 3.0000 mL | Freq: Two times a day (BID) | INTRAMUSCULAR | Status: DC

## 2013-10-31 MED ORDER — DOPAMINE-DEXTROSE 3.2-5 MG/ML-% IV SOLN
INTRAVENOUS | Status: AC
  Administered 2013-10-31: 2.976 ug/kg/min via INTRAVENOUS
  Filled 2013-10-31: qty 250

## 2013-10-31 MED ORDER — ONDANSETRON HCL 4 MG/2ML IJ SOLN
4.0000 mg | Freq: Four times a day (QID) | INTRAMUSCULAR | Status: DC | PRN
  Administered 2013-10-31: 4 mg via INTRAVENOUS
  Filled 2013-10-31: qty 2

## 2013-10-31 MED ORDER — SODIUM CHLORIDE 0.9 % IJ SOLN
3.0000 mL | Freq: Two times a day (BID) | INTRAMUSCULAR | Status: DC
  Administered 2013-10-31 – 2013-11-09 (×14): 3 mL via INTRAVENOUS

## 2013-10-31 MED ORDER — POTASSIUM CHLORIDE CRYS ER 20 MEQ PO TBCR
40.0000 meq | EXTENDED_RELEASE_TABLET | Freq: Once | ORAL | Status: AC
  Administered 2013-10-31: 40 meq via ORAL
  Filled 2013-10-31: qty 2

## 2013-10-31 MED ORDER — HEPARIN (PORCINE) IN NACL 2-0.9 UNIT/ML-% IJ SOLN
INTRAMUSCULAR | Status: AC
  Filled 2013-10-31: qty 1000

## 2013-10-31 MED ORDER — ENOXAPARIN SODIUM 30 MG/0.3ML ~~LOC~~ SOLN
30.0000 mg | SUBCUTANEOUS | Status: DC
  Administered 2013-11-01 – 2013-11-05 (×5): 30 mg via SUBCUTANEOUS
  Filled 2013-10-31 (×8): qty 0.3

## 2013-10-31 MED ORDER — FENTANYL CITRATE 0.05 MG/ML IJ SOLN
INTRAMUSCULAR | Status: AC
  Filled 2013-10-31: qty 2

## 2013-10-31 MED ORDER — SODIUM CHLORIDE 0.9 % IJ SOLN
3.0000 mL | Freq: Two times a day (BID) | INTRAMUSCULAR | Status: DC
Start: 2013-10-31 — End: 2013-10-31

## 2013-10-31 MED ORDER — LIDOCAINE HCL (PF) 1 % IJ SOLN
INTRAMUSCULAR | Status: AC
  Filled 2013-10-31: qty 30

## 2013-10-31 MED ORDER — MIDAZOLAM HCL 2 MG/2ML IJ SOLN
INTRAMUSCULAR | Status: AC
  Filled 2013-10-31: qty 2

## 2013-10-31 MED ORDER — ACETAMINOPHEN 325 MG PO TABS
650.0000 mg | ORAL_TABLET | ORAL | Status: DC | PRN
  Administered 2013-11-03 – 2013-11-09 (×7): 650 mg via ORAL
  Filled 2013-10-31 (×7): qty 2

## 2013-10-31 NOTE — Clinical Documentation Improvement (Signed)
FYI  I have edited my note as requested. Warm regards, Tobias Alexander, MD  THIS DOCUMENT IS NOT A PERMANENT PART OF THE MEDICAL RECORD  Please update your documentation with the medical record to reflect your response to this query. If you need help knowing how to do this please call [phone].  10/31/13   Dr. Delton See and/or Associates,  In a better effort to capture your patient's severity of illness, reflect appropriate length of stay and utilization of resources, a review of the patient medical record has revealed the following information:   - Medical history significant for DM II   - Medical hx. also significant for Proliferative Retinopathy;  Peripheral Arterial Disease;  Stage 4 CKD   - Medical history also includes Resistant Hypertension   Based on your clinical judgment, please document any DM manifestations and/or associated conditions in the progress notes and discharge summary:   - DM retinopathy    - DM PAD     - DM nephropathy   - Other DM Conditions   - Unable to Clinically Determine   Please exercise your independent judgment in responding to this query.    The fact that a query is asked, does not imply that any particular answer is desired or expected.   You may use possible, probable, or suspected with inpatient documentation.   However, possible, probable, or suspected diagnoses MUST be documented as such in the discharge summary unless confirmed or ruled out.    Reviewed: additional documentation in the medical record  Thank You,  Jerral Ralph  RN BSN CCDS Certified Clinical Documentation Specialist: [phone] Health Information Management [place]

## 2013-10-31 NOTE — Progress Notes (Signed)
Physical Therapy Treatment Patient Details Name: Nicole [name] MRN: [id number] DOB: 03-05-1935 Today's Date: 10/31/2013 Time: 4098-1191 PT Time Calculation (min): 15 min  PT Assessment / Plan / Recommendation  History of Present Illness Pt admit with CHF.    PT Comments   Pt admitted with above. Pt currently with functional limitations due to balance and endurance deficits.Limited treatment due to pt going to heart cath this pm.   Pt will benefit from skilled PT to increase their independence and safety with mobility to allow discharge to the venue listed below.   Follow Up Recommendations  SNF;Supervision/Assistance - 24 hour                 Equipment Recommendations  None recommended by PT        Frequency Min 3X/week   Progress towards PT Goals Progress towards PT goals: Progressing toward goals  Plan Current plan remains appropriate    Precautions / Restrictions Precautions Precautions: Fall Restrictions Weight Bearing Restrictions: No   Pertinent Vitals/Pain VSS, no pain    Mobility  Bed Mobility Bed Mobility: Rolling Right;Right Sidelying to Sit;Sitting - Scoot to Edge of Bed Rolling Right: 4: Min assist;With rail Rolling Left: Not tested (comment) Right Sidelying to Sit: 4: Min assist Sitting - Scoot to Edge of Bed: 4: Min assist Scooting to [facility]: 4: Min guard Details for Bed Mobility Assistance: cues to sequence movement Transfers Transfers: Sit to Stand;Stand to Sit Sit to Stand: 4: Min assist;With upper extremity assist;From bed Stand to Sit: 4: Min assist;With upper extremity assist;To bed Details for Transfer Assistance: Stood at [employer] in place with HHA.  Needs UE support.   Ambulation/Gait Ambulation/Gait Assistance: Not tested (comment) Stairs: No Wheelchair Mobility Wheelchair Mobility: No    Exercises General Exercises - Lower Extremity Ankle Circles/Pumps: AROM;Both;10 reps;Supine Long Arc Quad: AROM;Both;5 reps;Seated   PT Goals  (current goals can now be found in the care plan section)    Visit Information  Last PT Received On: 10/31/13 Assistance Needed: +1 History of Present Illness: Pt admit with CHF.     Subjective Data  Subjective: "I feel so tired."   Cognition  Cognition Arousal/Alertness: Awake/alert Behavior During Therapy: WFL for tasks assessed/performed Overall Cognitive Status: Within Functional Limits for tasks assessed    Balance  Balance Balance Assessed: Yes Static Standing Balance Static Standing - Balance Support: Bilateral upper extremity supported;During functional activity Static Standing - Level of Assistance: 4: Min assist Static Standing - Comment/# of Minutes: 2  End of Session PT - End of Session Equipment Utilized During Treatment: Gait belt;Oxygen Activity Tolerance: Patient limited by fatigue Patient left: in bed;with call bell/phone within reach Nurse Communication: Mobility status        INGOLD,Angas Isabell 10/31/2013, 1:05 PM [facility] Acute Rehabilitation [phone] (pager)

## 2013-10-31 NOTE — Progress Notes (Signed)
Spoke with patient about her diabetes.  Noted that her blood sugar was high on admission.  She states that she did not take her Lantus the night before she was admitted.  She normally takes Lantus 20 units at bedtime, if her blood sugar is greater than 159 mg/dl.  She will sometimes drop the dose to Lantus 15 units,  if  the blood sugars are less.  Dr. Kriste Basque is her PCP and follows her diabetes.  She does take Novolog with meals depending on her blood sugars.  Consider changing Lantus dosage to 15 units daily and continuing the Novolog SENISITIVE correction scale to see how her blood sugars do while in the hospital.  Smith Mince RN BSN CDE

## 2013-10-31 NOTE — Interval H&P Note (Signed)
History and Physical Interval Note:  10/31/2013 7:16 PM  Nicole [name]  has presented today for surgery, with the diagnosis of HF  The various methods of treatment have been discussed with the patient and family. After consideration of risks, benefits and other options for treatment, the patient has consented to  Procedure(s): RIGHT HEART CATH (N/A) as a surgical intervention .  The patient's history has been reviewed, patient examined, no change in status, stable for surgery.  I have reviewed the patient's chart and labs.  Questions were answered to the patient's satisfaction.     Daniel Bensimhon

## 2013-10-31 NOTE — CV Procedure (Signed)
Cardiac Cath Procedure Note:  Indication: HF, renal failure Procedures performed:  1) Right heart catheterization 2. Central line insertion  Description of procedure:   The risks and indication of the procedure were explained. Consent was signed and placed on the chart. An appropriate timeout was taken prior to the procedure.   The right neck was prepped and draped in the routine sterile fashion and anesthetized with 1% local lidocaine. Using u/s guidance the RIJ was cannulated and a 7FR triple lumen catheter was place using a modified Seldinger technique.  The right groin was also prepped and draped in the routine sterile fashion and anesthetized with 1% local lidocaine. A 7 FR venous sheath was placed in the right femoral vein using a modified Seldinger technique. A standard Swan-Ganz catheter was used for the procedure.   Complications: None apparent.  Findings:  RA = 16 RV =  57/13/17 PA =  49/21 (35) PCW = 35 (v=45) Fick cardiac output/index = 6.6/4.3 FA sat = 91% PA sat = 72%, 73%  Assessment: 1. Markedly elevated biventricular pressures 2. Normal cardiac output  Plan/Discussion:  She has had very sluggish diuresis despite markedly elevated filling pressures, normal cardiac output and high-dose diuretics. I suspect the major issue is near end-stage renal disease. Will start renal-dose dopamine to try and facilitate diuresis. Will consult Renal in the am.  Truman Hayward 7:22 PM

## 2013-10-31 NOTE — H&P (View-Only) (Signed)
  Patient Name: Nicole [name] Date of Encounter: 10/31/2013  Principal Problem:   Acute on chronic diastolic CHF (congestive heart failure) Active Problems:   HYPOTHYROIDISM   HYPERCHOLESTEROLEMIA   HYPERTENSION   CAD   GERD (gastroesophageal reflux disease)   Chronic kidney disease, stage III (moderate)   T2DM (type 2 diabetes mellitus)   Length of Stay: 2  SUBJECTIVE  The patient feels very uncomfortable, SOB, minimal improvement since yesterday.  CURRENT MEDS . amLODipine  10 mg Oral Daily  . aspirin EC  81 mg Oral Daily  . atorvastatin  20 mg Oral q1800  . carvedilol  12.5 mg Oral BID WC  . cholecalciferol  2,000 Units Oral Daily  . heparin  5,000 Units Subcutaneous Q8H  . hydrALAZINE  37.5 mg Oral TID  . insulin aspart  0-9 Units Subcutaneous TID WC  . insulin glargine  20 Units Subcutaneous QHS  . isosorbide mononitrate  20 mg Oral BID  . levothyroxine  75 mcg Oral QAC breakfast  . metolazone  5 mg Oral Daily  . pantoprazole  40 mg Oral Daily  . potassium chloride  40 mEq Oral Once  . sodium chloride  3 mL Intravenous Q12H   . furosemide (LASIX) infusion 30 mg/hr (10/31/13 0315)     OBJECTIVE  Filed Vitals:   10/30/13 1900 10/30/13 2039 10/31/13 0516 10/31/13 0942  BP: 144/46 144/54 141/41 146/31  Pulse: 62 63 58 60  Temp: 98 F (36.7 C) 98.3 F (36.8 C) 97.9 F (36.6 C)   TempSrc:  Oral Oral   Resp: 20 18 18   Height:      Weight:   146 lb 2.6 oz (66.3 kg)   SpO2: 96% 95% 95%     Intake/Output Summary (Last 24 hours) at 10/31/13 1122 Last data filed at 10/31/13 0936  Gross per 24 hour  Intake    780 ml  Output    975 ml  Net   -195 ml   Filed Weights   10/29/13 2209 10/30/13 0512 10/31/13 0516  Weight: 144 lb 6.4 oz (65.5 kg) 144 lb 8 oz (65.545 kg) 146 lb 2.6 oz (66.3 kg)    PHYSICAL EXAM  General: Pleasant, NAD. Neuro: Alert and oriented X 3. Moves all extremities spontaneously. Psych: Normal affect. HEENT:  Normal  Neck:  Supple without bruits, JVD + 8 cm. Lungs:  Resp regular and unlabored, crackles up to the mid lungs B/L Heart: RRR no s3, s4, or murmurs. Abdomen: Soft, non-tender, non-distended, BS + x 4.  Extremities: No clubbing, cyanosis, edema B/L entire lower extremity, very tight tender skin. Radials 2+, DP/DT non-pulpable B/L.  Accessory Clinical Findings  CBC  Recent Labs  10/29/13 1719 10/29/13 2320  WBC 6.2 5.5  HGB 12.4 11.9*  HCT 36.2 35.8*  MCV 93.3 95.7  PLT 217 193   Basic Metabolic Panel  Recent Labs  10/30/13 0610 10/31/13 0620  NA 136 139  K 3.8 3.2*  CL 99 101  CO2 18* 23  GLUCOSE 317* 49*  BUN 54* 58*  CREATININE 2.03* 2.37*  CALCIUM 8.9 8.9  MG 2.2  --    Cardiac Enzymes  Recent Labs  10/29/13 2320 10/30/13 0610 10/30/13 1112  TROPONINI <0.30 <0.30 <0.30   BNP (last 3 results)  Recent Labs  02/13/13 1145 02/14/13 0540 10/29/13 1719  PROBNP 2031.0* 1703.0* 5412.0*   TTE 08/13/2013 Study Conclusions  - Left ventricle: The cavity size was normal. Systolic function was vigorous. The   estimated ejection fraction was in the range of 65% to 70%. Wall motion was normal; there were no regional wall motion abnormalities. Features are consistent with a pseudonormal left ventricular filling pattern, with concomitant abnormal relaxation and increased filling pressure (grade 2 diastolic dysfunction). - Mitral valve: Calcified annulus. Mild regurgitation. - Left atrium: The atrium was mildly dilated. - Right atrium: The atrium was mildly dilated. Impressions: - Compared to the prior study, there has been no significant interval change.   Left heart cath 05/08/2011   CONCLUSION:   1. Well-known preserved left ventricular systolic function.  2. Calcified disease of the proximal circumflex coronary artery which  is a dominant vessel of uncertain severity with normal flow  characteristics in a patient with stable symptoms.   DISPOSITION: Based upon the  findings of the flow wire, a continued  medical therapy would be recommended. The patient was at moderately  high risk. She is an insulin-dependent diabetic that would likely  require drug-eluting platform. She has a baseline hemoglobin of about  10 today, and she has had a history of proliferative retinopathy as well  as some rectal bleeding on antiplatelet therapy. As she is stable  without current symptoms, a continued approach will be recommended.  Thomas D. Stuckey, MD, FACC    Radiology/Studies  Dg Chest 2 View  10/29/2013   CLINICAL DATA:  Chest pain.  Shortness of breath.  Fluid overload.  EXAM: CHEST  2 VIEW  COMPARISON:  08/12/2013  FINDINGS: Mild cardiomegaly is stable. Increased diffuse interstitial infiltrates are seen, consistent with interstitial edema. Small bilateral pleural effusions of also increased in size since previous study.  IMPRESSION: Worsening congestive heart failure with increased small bilateral pleural effusions.  TELE:  Sinus bradycardia , 55-60 BPM  ECG SR, normal ECG    ASSESSMENT AND PLAN  78 yo woman with PMH of CKD stage IV, T2DM on insulin, PAD, hypertension, known CAD, acute on chronic HFpEF here with pulmonary edema.  1. Acute on chronic heart failure with preserved ejection fraction  Admitted for weight gain and DOE, found to be in pulmonary edema, she was hospitalized in September and followed every 2 weeks in the clinic since then. She has never reached comfortable state, always SOB, currently at rest NYHA IV, minimal diuresis on high dose Lasix in the settings of CKD stage 4. Lasix increased to 30 ml/hr yesterday with negative fluid balance of only 200 in the last 24 hours. Minimal diuresis or symptoms improvement. She feels warm on exam and clinically appears warm/wet.   Plan: right sided cath this afternoon by Dr Bensihmon, consult to HF team for consideration of therapy with inotropes - possibly might benefit from Dopamin for  improvement of her kidney perfusion  2. Resistant hypertension - better controlled today  - hold ACEI - bad AKI after initiation   3. Acute on chronic kidney failure, stage IV  4. Fatigue - might be related to her normocytic anemia, most probably of chronic diseases. The patient is being treated with Procrit injections (epoetin).   5. CAD - chest pain free,  - medical management, the last cath on 05/08/2011 with heavily calcified proximal LCx lesion (dominant vessel), FFR showed non-significant stenosis, plus high risk of bleeding, medical management was recommended  6. Peripheral arterial disease - B/L, not significant claudications, possible intervention once kidney function is improved    Signed, Kharma Sampsel, H MD, FACC 10/31/2013   

## 2013-10-31 NOTE — Progress Notes (Signed)
Hypoglycemic Event  CBG47  Treatment: 15 GM gel  Symptoms: Hungry  Follow-up CBG: Time:0630 CBG Result:71  Possible Reasons for Event: Unknown  Comments/MD notified:no   Gwendoline Judy, Chucky May  Remember to initiate Hypoglycemia Order Set & complete

## 2013-10-31 NOTE — Clinical Social Work Psychosocial (Signed)
     Clinical Social Work Department BRIEF PSYCHOSOCIAL ASSESSMENT 10/31/2013  Patient:  Nicole [name], Nicole [name]     Account Number:  [id number]     Admit date:  10/29/2013  Clinical Social Worker:  Tiburcio Pea  Date/Time:  10/31/2013 11:44 AM  Referred by:  Physician  Date Referred:  10/31/2013 Referred for  SNF Placement   Other Referral:   Interview type:  Patient Other interview type:    PSYCHOSOCIAL DATA Living Status:  ALONE Admitted from facility:   Level of care:   Primary support name:   Primary support relationship to patient:  CHILD, ADULT Degree of support available:   Both sons live very close to patient: Rocky Link and Bernerd Pho    CURRENT CONCERNS Current Concerns  Post-Acute Placement   Other Concerns:   Patient refused    SOCIAL WORK ASSESSMENT / PLAN Social work Tax inspector and CSW met with pt at bedside. CSW asked pt what her living situation is and pt responded that she lives at home alone with her dog. Pt said that her two sons and daughter in law help take care of her. Pt stated that they live very close to pt. Social work Tax inspector explained that PT was recommending SNF to get stronger. Social work Tax inspector explained the process of searching for a bed. Pt stated that she wanted to go home and have nurses come out and help her. CSW explained that is she changed her mind to inform the nurse so social workers can get involved again.   Assessment/plan status:  No Further Intervention Required Other assessment/ plan:   Information/referral to community resources:   Left list of beds with CSW's name and number left at bedside.    PATIENTS/FAMILYS RESPONSE TO PLAN OF CARE: pt was alert and orientated. Pt was pleasant. Pt refuses to go to snf and wants to have home health care set up. Social work Tax inspector asked if it was okay to call her sons and pt said no. CSW informed case manager about pt. and need to follow up with home health needs.  SW will sign off but will  available as needed at this time.  Baruch Goldmann, [place] Intern [phone]    I have reviewd and agree to above assessment.  Lorri Frederick. [place] Supervisor  [phone]

## 2013-10-31 NOTE — Progress Notes (Signed)
Patient Name: Nicole [name] Date of Encounter: 10/31/2013  Principal Problem:   Acute on chronic diastolic CHF (congestive heart failure) Active Problems:   HYPOTHYROIDISM   HYPERCHOLESTEROLEMIA   HYPERTENSION   CAD   GERD (gastroesophageal reflux disease)   Chronic kidney disease, stage III (moderate)   T2DM (type 2 diabetes mellitus)   Length of Stay: 2  SUBJECTIVE  The patient feels very uncomfortable, SOB, minimal improvement since yesterday.  CURRENT MEDS . amLODipine  10 mg Oral Daily  . aspirin EC  81 mg Oral Daily  . atorvastatin  20 mg Oral q1800  . carvedilol  12.5 mg Oral BID WC  . cholecalciferol  2,000 Units Oral Daily  . heparin  5,000 Units Subcutaneous Q8H  . hydrALAZINE  37.5 mg Oral TID  . insulin aspart  0-9 Units Subcutaneous TID WC  . insulin glargine  20 Units Subcutaneous QHS  . isosorbide mononitrate  20 mg Oral BID  . levothyroxine  75 mcg Oral QAC breakfast  . metolazone  5 mg Oral Daily  . pantoprazole  40 mg Oral Daily  . potassium chloride  40 mEq Oral Once  . sodium chloride  3 mL Intravenous Q12H   . furosemide (LASIX) infusion 30 mg/hr (10/31/13 0315)     OBJECTIVE  Filed Vitals:   10/30/13 1900 10/30/13 2039 10/31/13 0516 10/31/13 0942  BP: 144/46 144/54 141/41 146/31  Pulse: 62 63 58 60  Temp: 98 F (36.7 C) 98.3 F (36.8 C) 97.9 F (36.6 C)   TempSrc:  Oral Oral   Resp: 20 18 18    Height:      Weight:   146 lb 2.6 oz (66.3 kg)   SpO2: 96% 95% 95%     Intake/Output Summary (Last 24 hours) at 10/31/13 1122 Last data filed at 10/31/13 0936  Gross per 24 hour  Intake    780 ml  Output    975 ml  Net   -195 ml   Filed Weights   10/29/13 2209 10/30/13 0512 10/31/13 0516  Weight: 144 lb 6.4 oz (65.5 kg) 144 lb 8 oz (65.545 kg) 146 lb 2.6 oz (66.3 kg)    PHYSICAL EXAM  General: Pleasant, NAD. Neuro: Alert and oriented X 3. Moves all extremities spontaneously. Psych: Normal affect. HEENT:  Normal  Neck:  Supple without bruits, JVD + 8 cm. Lungs:  Resp regular and unlabored, crackles up to the mid lungs B/L Heart: RRR no s3, s4, or murmurs. Abdomen: Soft, non-tender, non-distended, BS + x 4.  Extremities: No clubbing, cyanosis, edema B/L entire lower extremity, very tight tender skin. Radials 2+, DP/DT non-pulpable B/L.  Accessory Clinical Findings  CBC  Recent Labs  10/29/13 1719 10/29/13 2320  WBC 6.2 5.5  HGB 12.4 11.9*  HCT 36.2 35.8*  MCV 93.3 95.7  PLT 217 193   Basic Metabolic Panel  Recent Labs  10/30/13 0610 10/31/13 0620  NA 136 139  K 3.8 3.2*  CL 99 101  CO2 18* 23  GLUCOSE 317* 49*  BUN 54* 58*  CREATININE 2.03* 2.37*  CALCIUM 8.9 8.9  MG 2.2  --    Cardiac Enzymes  Recent Labs  10/29/13 2320 10/30/13 0610 10/30/13 1112  TROPONINI <0.30 <0.30 <0.30   BNP (last 3 results)  Recent Labs  02/13/13 1145 02/14/13 0540 10/29/13 1719  PROBNP 2031.0* 1703.0* 5412.0*   TTE 08/13/2013 Study Conclusions  - Left ventricle: The cavity size was normal. Systolic function was vigorous. The  estimated ejection fraction was in the range of 65% to 70%. Wall motion was normal; there were no regional wall motion abnormalities. Features are consistent with a pseudonormal left ventricular filling pattern, with concomitant abnormal relaxation and increased filling pressure (grade 2 diastolic dysfunction). - Mitral valve: Calcified annulus. Mild regurgitation. - Left atrium: The atrium was mildly dilated. - Right atrium: The atrium was mildly dilated. Impressions: - Compared to the prior study, there has been no significant interval change.   Left heart cath 05/08/2011   CONCLUSION:   1. Well-known preserved left ventricular systolic function.  2. Calcified disease of the proximal circumflex coronary artery which  is a dominant vessel of uncertain severity with normal flow  characteristics in a patient with stable symptoms.   DISPOSITION: Based upon the  findings of the flow wire, a continued  medical therapy would be recommended. The patient was at moderately  high risk. She is an insulin-dependent diabetic that would likely  require drug-eluting platform. She has a baseline hemoglobin of about  10 today, and she has had a history of proliferative retinopathy as well  as some rectal bleeding on antiplatelet therapy. As she is stable  without current symptoms, a continued approach will be recommended.  Arturo Morton. Riley Kill, MD, [facility]    Radiology/Studies  Dg Chest 2 View  10/29/2013   CLINICAL DATA:  Chest pain.  Shortness of breath.  Fluid overload.  EXAM: CHEST  2 VIEW  COMPARISON:  08/12/2013  FINDINGS: Mild cardiomegaly is stable. Increased diffuse interstitial infiltrates are seen, consistent with interstitial edema. Small bilateral pleural effusions of also increased in size since previous study.  IMPRESSION: Worsening congestive heart failure with increased small bilateral pleural effusions.  TELE:  Sinus bradycardia , 55-60 BPM  ECG SR, normal ECG    ASSESSMENT AND PLAN  78 yo woman with PMH of CKD stage IV, T2DM on insulin, PAD, hypertension, known CAD, acute on chronic HFpEF here with pulmonary edema.  1. Acute on chronic heart failure with preserved ejection fraction  Admitted for weight gain and DOE, found to be in pulmonary edema, she was hospitalized in September and followed every 2 weeks in the clinic since then. She has never reached comfortable state, always SOB, currently at rest NYHA IV, minimal diuresis on high dose Lasix in the settings of CKD stage 4. Lasix increased to 30 ml/hr yesterday with negative fluid balance of only 200 in the last 24 hours. Minimal diuresis or symptoms improvement. She feels warm on exam and clinically appears warm/wet.   Plan: right sided cath this afternoon by Dr Jones Broom, consult to HF team for consideration of therapy with inotropes - possibly might benefit from Dopamin for  improvement of her kidney perfusion  2. Resistant hypertension - better controlled today  - hold ACEI - bad AKI after initiation   3. Acute on chronic kidney failure, stage IV  4. Fatigue - might be related to her normocytic anemia, most probably of chronic diseases. The patient is being treated with Procrit injections (epoetin).   5. CAD - chest pain free,  - medical management, the last cath on 05/08/2011 with heavily calcified proximal LCx lesion (dominant vessel), FFR showed non-significant stenosis, plus high risk of bleeding, medical management was recommended  6. Peripheral arterial disease - B/L, not significant claudications, possible intervention once kidney function is improved    Signed, Tobias Alexander, H MD, [facility] 10/31/2013

## 2013-11-01 ENCOUNTER — Encounter (HOSPITAL_COMMUNITY): Payer: Medicare Other

## 2013-11-01 ENCOUNTER — Inpatient Hospital Stay (HOSPITAL_COMMUNITY): Payer: Medicare Other

## 2013-11-01 ENCOUNTER — Encounter: Payer: Self-pay | Admitting: *Deleted

## 2013-11-01 DIAGNOSIS — I5033 Acute on chronic diastolic (congestive) heart failure: Secondary | ICD-10-CM

## 2013-11-01 LAB — GLUCOSE, CAPILLARY
Glucose-Capillary: 179 mg/dL — ABNORMAL HIGH (ref 70–99)
Glucose-Capillary: 209 mg/dL — ABNORMAL HIGH (ref 70–99)
Glucose-Capillary: 179 mg/dL — ABNORMAL HIGH (ref 70–99)
Glucose-Capillary: 183 mg/dL — ABNORMAL HIGH (ref 70–99)

## 2013-11-01 LAB — BASIC METABOLIC PANEL
BUN: 58 mg/dL — ABNORMAL HIGH (ref 6–23)
Calcium: 8.7 mg/dL (ref 8.4–10.5)
Chloride: 99 mEq/L (ref 96–112)
GFR calc non Af Amer: 19 mL/min — ABNORMAL LOW (ref >90)
Glucose, Bld: 275 mg/dL — ABNORMAL HIGH (ref 70–99)

## 2013-11-01 MED ORDER — PENTAFLUOROPROP-TETRAFLUOROETH EX AERO: 1.0000 "application " | INHALATION_SPRAY | CUTANEOUS | Status: DC | PRN

## 2013-11-01 MED ORDER — FENTANYL CITRATE 0.05 MG/ML IJ SOLN
INTRAMUSCULAR | Status: AC
  Administered 2013-11-01: 25 ug via INTRAVENOUS
  Filled 2013-11-01: qty 2

## 2013-11-01 MED ORDER — ALTEPLASE 2 MG IJ SOLR
2.0000 mg | Freq: Once | INTRAMUSCULAR | Status: AC | PRN
  Filled 2013-11-01: qty 2

## 2013-11-01 MED ORDER — HYDRALAZINE HCL 20 MG/ML IJ SOLN
10.0000 mg | Freq: Once | INTRAMUSCULAR | Status: DC
  Filled 2013-11-01: qty 1

## 2013-11-01 MED ORDER — HEPARIN SODIUM (PORCINE) 1000 UNIT/ML DIALYSIS
1000.0000 [IU] | INTRAMUSCULAR | Status: DC | PRN
  Administered 2013-11-01: 1000 [IU] via INTRAVENOUS_CENTRAL
  Administered 2013-11-01: 400 [IU] via INTRAVENOUS_CENTRAL
  Administered 2013-11-01: 1000 [IU] via INTRAVENOUS_CENTRAL
  Filled 2013-11-01 (×3): qty 1

## 2013-11-01 MED ORDER — HEPARIN SODIUM (PORCINE) 1000 UNIT/ML DIALYSIS: 3000.0000 [IU] | INTRAMUSCULAR | Status: DC | PRN

## 2013-11-01 MED ORDER — NEPRO/CARBSTEADY PO LIQD
237.0000 mL | ORAL | Status: DC | PRN
  Filled 2013-11-01: qty 237

## 2013-11-01 MED ORDER — HYDRALAZINE HCL 50 MG PO TABS
50.0000 mg | ORAL_TABLET | Freq: Three times a day (TID) | ORAL | Status: DC
  Administered 2013-11-01 – 2013-11-02 (×2): 50 mg via ORAL
  Filled 2013-11-01 (×7): qty 1

## 2013-11-01 MED ORDER — FENTANYL CITRATE 0.05 MG/ML IJ SOLN
25.0000 ug | Freq: Once | INTRAMUSCULAR | Status: AC
  Administered 2013-11-01: 25 ug via INTRAVENOUS

## 2013-11-01 MED ORDER — SODIUM CHLORIDE 0.9 % IV SOLN: 100.0000 mL | INTRAVENOUS | Status: DC | PRN

## 2013-11-01 MED ORDER — MIDAZOLAM HCL 2 MG/2ML IJ SOLN
2.0000 mg | Freq: Once | INTRAMUSCULAR | Status: AC
  Administered 2013-11-01: 2 mg via INTRAVENOUS

## 2013-11-01 MED ORDER — LIDOCAINE-PRILOCAINE 2.5-2.5 % EX CREA
1.0000 "application " | TOPICAL_CREAM | CUTANEOUS | Status: DC | PRN
  Filled 2013-11-01: qty 5

## 2013-11-01 MED ORDER — MIDAZOLAM HCL 2 MG/2ML IJ SOLN
INTRAMUSCULAR | Status: AC
  Administered 2013-11-01: 2 mg via INTRAVENOUS
  Filled 2013-11-01: qty 2

## 2013-11-01 MED ORDER — LIDOCAINE HCL (PF) 1 % IJ SOLN: 5.0000 mL | INTRAMUSCULAR | Status: DC | PRN

## 2013-11-01 NOTE — Procedures (Signed)
Central Venous Catheter Insertion Procedure Note Nicole [name] [id number] 1935/05/05  Procedure: Insertion of Central Venous Trialysis Catheter Indications: Hemodialysis  After consent was obtained the right neck and existing central venous catheter were prepped sterilely. The patient was then draped in routine fashion. I wire was fed through the existing catheter and the catheter was removed over the wire. Serial dilations were then performed and the trialysis catheter was then placed into the R IJ over the wire. All ports were flushed and the catheter was sewn into place.   CXR: pending  No apparent complications.   Bethan Adamek 11/01/2013, 5:04 PM

## 2013-11-01 NOTE — Consult Note (Signed)
Reason for Consult: Refractory volume overload in a patient with congestive heart failure and chronic kidney disease stage IV Referring Physician: Dr. Arvilla Meres (cardiology)  HPI:  78 year old Caucasian woman past medical history significant for chronic diastolic heart failure, coronary artery disease, history of PAD and what appears to be multifactorial chronic kidney disease stage IV (baseline creatinine ranging from 1.4-1.8). Admitted with worsening shortness of breath as well as sensation of swelling in her upper extremities, thighs and abdominal wall. Clinically with features of congestive heart failure exacerbation for which diuretic therapy as well as pressors have been attempted over the past 48-72 hours with limited benefit (she is net -724 cc since admission with a urine output averaging about [phone] cc per day). Right heart catheterization also undertaken to better understand her hemodynamic status. I discussed with the patient, and with several reservations, she is willing to undertake dialysis (further assisted in his decision making by her son who has ESRD status post kidney transplant). I informed the patient that this was probably temporary hemodialysis with anticipated renal recovery as we improve her volume status.   Past Medical History  Diagnosis Date  . Retinopathy   . Hypertension   . Chronic diastolic CHF (congestive heart failure)     has a normal EF per echo 03/2011 & 5/2013with diastolic dysfunction  . CAD (coronary artery disease)     Prior PCI in 1998 x 2; s/p cath in 2001, negative Myoview in 2008 & 2011, s/p cath in June 2012 with calcified disease of the proximal LCX with normal flow reserve. She has been managed medically due to her other morbidities  . Cerebrovascular disease   . Peripheral vascular disease     prior angiography showing RSFA stenosis, left anterior tibial and left posterior tibial stenoses  . Hypercholesterolemia   . Diabetes mellitus      insulin dependent  . Hypothyroidism   . Hiatal hernia   . Degenerative joint disease   . Osteoporosis   . Vitamin D deficiency   . Anemia of chronic disease   . Renal insufficiency   . Shortness of breath   . Anginal pain     Past Surgical History  Procedure Laterality Date  . Abdominal aortogram  07/27/2003    by Dr. Chales Abrahams  . Bilateral lower extremity angiogram  07/27/2003    by Dr. Chales Abrahams  . Selective angiography,right superficial femoral artery, right and left iliac arteries  07/27/2003    by Dr. Chales Abrahams  . Measurement of gradient right and left iliac arteries  07/27/2003    by Dr. Chales Abrahams  . Bilateral cataract sugery  2000 and 2002    by Dr. Cecilie Kicks  . Repair left hip fracture  04/2008    by Dr. Charlann Boxer    Family History  Problem Relation Age of Onset  . Heart disease Mother     Social History:  reports that she quit smoking about 38 years ago. Her smoking use included Cigarettes. She smoked 0.00 packs per day. She has never used smokeless tobacco. She reports that she does not drink alcohol or use illicit drugs.  Allergies:  Allergies  Allergen Reactions  . Ibuprofen     REACTION: edema---thyroid problems  . Prednisone     Causes severe fluid retention    Medications:  Scheduled: . amLODipine  10 mg Oral Daily  . aspirin EC  81 mg Oral Daily  . atorvastatin  20 mg Oral q1800  . carvedilol  12.5 mg Oral BID WC  .  cholecalciferol  2,000 Units Oral Daily  . enoxaparin (LOVENOX) injection  30 mg Subcutaneous Q24H  . hydrALAZINE  10 mg Intravenous Once  . hydrALAZINE  50 mg Oral TID  . insulin aspart  0-9 Units Subcutaneous TID WC  . insulin glargine  20 Units Subcutaneous QHS  . isosorbide mononitrate  20 mg Oral BID  . levothyroxine  75 mcg Oral QAC breakfast  . metolazone  5 mg Oral Daily  . pantoprazole  40 mg Oral Daily  . sodium chloride  3 mL Intravenous Q12H    Results for orders placed during the hospital encounter of 10/29/13 (from the past 48 hour(s))   SODIUM, URINE, RANDOM     Status: None   Collection Time    10/30/13  2:52 PM      Result Value Range   Sodium, Ur 76    GLUCOSE, CAPILLARY     Status: Abnormal   Collection Time    10/30/13  4:09 PM      Result Value Range   Glucose-Capillary 198 (*) 70 - 99 mg/dL   Comment 1 Notify RN    GLUCOSE, CAPILLARY     Status: Abnormal   Collection Time    10/30/13  9:17 PM      Result Value Range   Glucose-Capillary 185 (*) 70 - 99 mg/dL   Comment 1 Notify RN    GLUCOSE, CAPILLARY     Status: Abnormal   Collection Time    10/31/13  6:07 AM      Result Value Range   Glucose-Capillary 47 (*) 70 - 99 mg/dL  BASIC METABOLIC PANEL     Status: Abnormal   Collection Time    10/31/13  6:20 AM      Result Value Range   Sodium 139  135 - 145 mEq/L   Potassium 3.2 (*) 3.5 - 5.1 mEq/L   Chloride 101  96 - 112 mEq/L   CO2 23  19 - 32 mEq/L   Glucose, Bld 49 (*) 70 - 99 mg/dL   BUN 58 (*) 6 - 23 mg/dL   Creatinine, Ser 1.61 (*) 0.50 - 1.10 mg/dL   Calcium 8.9  8.4 - 09.6 mg/dL   GFR calc non Af Amer 19 (*) >90 mL/min   GFR calc Af Amer 22 (*) >90 mL/min   Comment: (NOTE)     The eGFR has been calculated using the CKD EPI equation.     This calculation has not been validated in all clinical situations.     eGFR's persistently <90 mL/min signify possible Chronic Kidney     Disease.  GLUCOSE, CAPILLARY     Status: None   Collection Time    10/31/13  6:38 AM      Result Value Range   Glucose-Capillary 71  70 - 99 mg/dL  CLOSTRIDIUM DIFFICILE BY PCR     Status: None   Collection Time    10/31/13 10:45 AM      Result Value Range   C difficile by pcr NEGATIVE  NEGATIVE  GLUCOSE, CAPILLARY     Status: Abnormal   Collection Time    10/31/13 11:19 AM      Result Value Range   Glucose-Capillary 131 (*) 70 - 99 mg/dL   Comment 1 Notify RN    POCT I-STAT 3, BLOOD GAS (G3P V)     Status: Abnormal   Collection Time    10/31/13  6:58 PM      Result Value  Range   pH, Ven 7.351 (*) 7.250 -  7.300   pCO2, Ven 39.8 (*) 45.0 - 50.0 mmHg   pO2, Ven 41.0  30.0 - 45.0 mmHg   Bicarbonate 22.0  20.0 - 24.0 mEq/L   TCO2 23  0 - 100 mmol/L   O2 Saturation 73.0     Acid-base deficit 3.0 (*) 0.0 - 2.0 mmol/L   Sample type VENOUS    POCT I-STAT 3, BLOOD GAS (G3P V)     Status: Abnormal   Collection Time    10/31/13  7:00 PM      Result Value Range   pH, Ven 7.349 (*) 7.250 - 7.300   pCO2, Ven 40.3 (*) 45.0 - 50.0 mmHg   pO2, Ven 39.0  30.0 - 45.0 mmHg   Bicarbonate 22.2  20.0 - 24.0 mEq/L   TCO2 23  0 - 100 mmol/L   O2 Saturation 70.0     Acid-base deficit 3.0 (*) 0.0 - 2.0 mmol/L   Sample type VENOUS     Comment NOTIFIED PHYSICIAN    POCT I-STAT 3, BLOOD GAS (G3+)     Status: Abnormal   Collection Time    10/31/13  7:03 PM      Result Value Range   pH, Arterial 7.385  7.350 - 7.450   pCO2 arterial 35.3  35.0 - 45.0 mmHg   pO2, Arterial 61.0 (*) 80.0 - 100.0 mmHg   Bicarbonate 21.1  20.0 - 24.0 mEq/L   TCO2 22  0 - 100 mmol/L   O2 Saturation 91.0     Acid-base deficit 3.0 (*) 0.0 - 2.0 mmol/L   Sample type ARTERIAL    GLUCOSE, CAPILLARY     Status: Abnormal   Collection Time    10/31/13  7:12 PM      Result Value Range   Glucose-Capillary 179 (*) 70 - 99 mg/dL  MRSA PCR SCREENING     Status: None   Collection Time    10/31/13  7:42 PM      Result Value Range   MRSA by PCR NEGATIVE  NEGATIVE   Comment:            The GeneXpert MRSA Assay (FDA     approved for NASAL specimens     only), is one component of a     comprehensive MRSA colonization     surveillance program. It is not     intended to diagnose MRSA     infection nor to guide or     monitor treatment for     MRSA infections.  CBC     Status: None   Collection Time    10/31/13  8:30 PM      Result Value Range   WBC 7.1  4.0 - 10.5 K/uL   RBC 3.98  3.87 - 5.11 MIL/uL   Hemoglobin 12.6  12.0 - 15.0 g/dL   HCT 62.9  52.8 - 41.3 %   MCV 94.2  78.0 - 100.0 fL   MCH 31.7  26.0 - 34.0 pg   MCHC 33.6   30.0 - 36.0 g/dL   RDW 24.4  01.0 - 27.2 %   Platelets 245  150 - 400 K/uL  CREATININE, SERUM     Status: Abnormal   Collection Time    10/31/13  8:30 PM      Result Value Range   Creatinine, Ser 2.10 (*) 0.50 - 1.10 mg/dL   GFR calc non Af Amer 22 (*) >  90 mL/min   GFR calc Af Amer 25 (*) >90 mL/min   Comment: (NOTE)     The eGFR has been calculated using the CKD EPI equation.     This calculation has not been validated in all clinical situations.     eGFR's persistently <90 mL/min signify possible Chronic Kidney     Disease.  GLUCOSE, CAPILLARY     Status: Abnormal   Collection Time    10/31/13  9:15 PM      Result Value Range   Glucose-Capillary 268 (*) 70 - 99 mg/dL  BASIC METABOLIC PANEL     Status: Abnormal   Collection Time    11/01/13  4:30 AM      Result Value Range   Sodium 135  135 - 145 mEq/L   Potassium 4.0  3.5 - 5.1 mEq/L   Comment: DELTA CHECK NOTED   Chloride 99  96 - 112 mEq/L   CO2 22  19 - 32 mEq/L   Glucose, Bld 275 (*) 70 - 99 mg/dL   BUN 58 (*) 6 - 23 mg/dL   Creatinine, Ser 1.61 (*) 0.50 - 1.10 mg/dL   Calcium 8.7  8.4 - 09.6 mg/dL   GFR calc non Af Amer 19 (*) >90 mL/min   GFR calc Af Amer 22 (*) >90 mL/min   Comment: (NOTE)     The eGFR has been calculated using the CKD EPI equation.     This calculation has not been validated in all clinical situations.     eGFR's persistently <90 mL/min signify possible Chronic Kidney     Disease.    No results found.  Review of Systems  Constitutional: Positive for malaise/fatigue. Negative for fever, chills, weight loss and diaphoresis.  HENT: Positive for congestion. Negative for ear discharge, ear pain, nosebleeds and sore throat.        Dry nose from oxygen  Eyes: Negative.   Respiratory: Positive for cough and shortness of breath. Negative for hemoptysis, sputum production, wheezing and stridor.   Cardiovascular: Negative.   Gastrointestinal: Positive for nausea. Negative for heartburn, vomiting,  abdominal pain, diarrhea, constipation and blood in stool.  Musculoskeletal: Positive for back pain, joint pain and myalgias.       Knees hurting  Skin: Negative.   Neurological: Positive for weakness. Negative for dizziness, tingling and tremors.  Psychiatric/Behavioral: Positive for depression. Negative for suicidal ideas, hallucinations and substance abuse. The patient is nervous/anxious.    Blood pressure 142/29, pulse 62, temperature 98.3 F (36.8 C), temperature source Oral, resp. rate 13, height 5\' 4"  (1.626 m), weight 66.1 kg (145 lb 11.6 oz), SpO2 96.00%. Physical Exam  Nursing note and vitals reviewed. Constitutional: She is oriented to person, place, and time. She appears well-developed and well-nourished.  Appears uncomfortable- son by bedside  HENT:  Head: Normocephalic and atraumatic.  Nose: Nose normal.  Mouth/Throat: No oropharyngeal exudate.  Eyes: Conjunctivae and EOM are normal. Pupils are equal, round, and reactive to light. No scleral icterus.  Neck: Normal range of motion. Neck supple. JVD present. No tracheal deviation present. No thyromegaly present.  7cm JVP  Cardiovascular: Normal rate and regular rhythm.  Exam reveals no friction rub.   No murmur heard. Respiratory: Effort normal. No respiratory distress. She has no wheezes. She has rales.  Bilaterally over lower 1/3 of lung fields  GI: Soft. Bowel sounds are normal. She exhibits no distension. There is no tenderness. There is no rebound.  Musculoskeletal: Normal range of motion. She  exhibits edema and tenderness.  Tense and tender thigh edema as well as (primary posterolateral) abdominal wall edema  Lymphadenopathy:    She has no cervical adenopathy.  Neurological: She is alert and oriented to person, place, and time. No cranial nerve deficit.  Skin: Skin is warm and dry. No rash noted. No erythema.  Psychiatric: She has a normal mood and affect. Thought content normal.    Assessment/Plan: 1. Acute renal  failure on chronic kidney disease stage IV: Appears to be secondary to acute cardiorenal syndrome (acute exacerbation of diastolic heart failure). Unfortunately, refractory to medical management so far and I will attempt hemodialysis to help with fluid removal. Unclear as to whether she will require permanent hemodialysis although with baseline renal insufficiency-chances are great. Will switch to bolus dosing of furosemide after dialysis started. 2. Acute exacerbation of diastolic heart failure: Unfortunately refractory to medical management with furosemide drip/dopamine. Plan for assistance with volume removal with dialysis. 3. Anemia of chronic kidney disease: Hemoglobin currently at goal-she apparently has had an "adverse reaction" to Procrit after which she gets diffuse arthralgias and myalgias. 4. Hypertension: On antihypertensive agents and anticipate will improve further with ultrafiltration on hemodialysis.  Nicole [name] K. 11/01/2013, 11:40 AM

## 2013-11-01 NOTE — Procedures (Signed)
I was present at this session.  I have reviewed the session itself and made appropriate changes. HD via temp R IJcath.   Alexa Blish L 11/26/20149:04 PM

## 2013-11-01 NOTE — Progress Notes (Signed)
Patient transported via bed with monitor, oxygen, and dialysis RN to hemodialysis.  VSS. No complaints voiced.

## 2013-11-01 NOTE — Progress Notes (Signed)
Patient Name: Nicole [name] Date of Encounter: 11/01/2013  SUBJECTIVE  RHC 10/31/13 RA = 16  RV = 57/13/17  PA = 49/21 (35)  PCW = 35 (v=45)  Fick cardiac output/index = 6.6/4.3  FA sat = 91%  PA sat = 72%, 73%  She was started on renal dose dopamine after cath to try and facilitate diuresis. Weight stable and 24 hr I/O 640 cc. SBP now 150-170s. Cr up 2.33  CURRENT MEDS . amLODipine  10 mg Oral Daily  . aspirin EC  81 mg Oral Daily  . atorvastatin  20 mg Oral q1800  . carvedilol  12.5 mg Oral BID WC  . cholecalciferol  2,000 Units Oral Daily  . enoxaparin (LOVENOX) injection  30 mg Subcutaneous Q24H  . hydrALAZINE  37.5 mg Oral TID  . insulin aspart  0-9 Units Subcutaneous TID WC  . insulin glargine  20 Units Subcutaneous QHS  . isosorbide mononitrate  20 mg Oral BID  . levothyroxine  75 mcg Oral QAC breakfast  . metolazone  5 mg Oral Daily  . pantoprazole  40 mg Oral Daily  . sodium chloride  3 mL Intravenous Q12H   . DOPamine 2.976 mcg/kg/min (10/31/13 1959)  . furosemide (LASIX) infusion 30 mg/hr (11/01/13 0620)     OBJECTIVE  Filed Vitals:   11/01/13 0400 11/01/13 0500 11/01/13 0600 11/01/13 0700  BP: 168/40 158/70 156/32 171/30  Pulse: 80 70 69 70  Temp: 97.7 F (36.5 C)     TempSrc: Oral     Resp: 19 14 20 18   Height:      Weight: 145 lb 11.6 oz (66.1 kg)     SpO2: 95% 94% 96% 94%    Intake/Output Summary (Last 24 hours) at 11/01/13 0728 Last data filed at 11/01/13 0700  Gross per 24 hour  Intake 800.76 ml  Output   1425 ml  Net -624.24 ml   Filed Weights   10/31/13 0516 10/31/13 2000 11/01/13 0400  Weight: 146 lb 2.6 oz (66.3 kg) 145 lb 4.5 oz (65.9 kg) 145 lb 11.6 oz (66.1 kg)    PHYSICAL EXAM  General: Pleasant, NAD. Lying in bed Neuro: Alert and oriented X 3. Moves all extremities spontaneously. Psych: Normal affect. HEENT:  Normal  Neck: Supple without bruits, JVD to jaw Lungs:  Resp regular and unlabored, crackles up to the mid  lungs B/L Heart: RRR no s3, s4, or murmurs. Abdomen: Soft, non-tender, distended, BS + x 4.  Extremities: No clubbing, cyanosis, bilateral LE 2+ to thighs, 2-3+ bilateral UE.; very tight tender skin,  Radials 2+, DP/DT non-pulpable B/L.  Accessory Clinical Findings  CBC  Recent Labs  10/29/13 2320 10/31/13 2030  WBC 5.5 7.1  HGB 11.9* 12.6  HCT 35.8* 37.5  MCV 95.7 94.2  PLT 193 245   Basic Metabolic Panel  Recent Labs  10/30/13 0610 10/31/13 0620 10/31/13 2030 11/01/13 0430  NA 136 139  --  135  K 3.8 3.2*  --  4.0  CL 99 101  --  99  CO2 18* 23  --  22  GLUCOSE 317* 49*  --  275*  BUN 54* 58*  --  58*  CREATININE 2.03* 2.37* 2.10* 2.33*  CALCIUM 8.9 8.9  --  8.7  MG 2.2  --   --   --    Cardiac Enzymes  Recent Labs  10/29/13 2320 10/30/13 0610 10/30/13 1112  TROPONINI <0.30 <0.30 <0.30   BNP (last 3 results)  Recent  Labs  02/13/13 1145 02/14/13 0540 10/29/13 1719  PROBNP 2031.0* 1703.0* 5412.0*   TTE 08/13/2013 Study Conclusions  - Left ventricle: The cavity size was normal. Systolic function was vigorous. The estimated ejection fraction was in the range of 65% to 70%. Wall motion was normal; there were no regional wall motion abnormalities. Features are consistent with a pseudonormal left ventricular filling pattern, with concomitant abnormal relaxation and increased filling pressure (grade 2 diastolic dysfunction). - Mitral valve: Calcified annulus. Mild regurgitation. - Left atrium: The atrium was mildly dilated. - Right atrium: The atrium was mildly dilated. Impressions: - Compared to the prior study, there has been no significant interval change.   Left heart cath 05/08/2011   CONCLUSION:   1. Well-known preserved left ventricular systolic function.  2. Calcified disease of the proximal circumflex coronary artery which  is a dominant vessel of uncertain severity with normal flow  characteristics in a patient with stable symptoms.    DISPOSITION: Based upon the findings of the flow wire, a continued  medical therapy would be recommended. The patient was at moderately  high risk. She is an insulin-dependent diabetic that would likely  require drug-eluting platform. She has a baseline hemoglobin of about  10 today, and she has had a history of proliferative retinopathy as well  as some rectal bleeding on antiplatelet therapy. As she is stable  without current symptoms, a continued approach will be recommended.  Arturo Morton. Riley Kill, MD, [facility]    Radiology/Studies  Dg Chest 2 View  10/29/2013   CLINICAL DATA:  Chest pain.  Shortness of breath.  Fluid overload.  EXAM: CHEST  2 VIEW  COMPARISON:  08/12/2013  FINDINGS: Mild cardiomegaly is stable. Increased diffuse interstitial infiltrates are seen, consistent with interstitial edema. Small bilateral pleural effusions of also increased in size since previous study.  IMPRESSION: Worsening congestive heart failure with increased small bilateral pleural effusions.  TELE:  Sinus bradycardia , 55-60 BPM  ECG SR, normal ECG    ASSESSMENT AND PLAN  78 yo woman with PMH of CKD stage IV, T2DM on insulin, PAD, hypertension, known CAD, acute on chronic HFpEF here with pulmonary edema.  1. Acute on chronic heart failure with preserved ejection fraction  Admitted for weight gain and DOE, found to be in pulmonary edema, she was hospitalized in September and followed every 2 weeks in the clinic since then. She has never reached comfortable state, always SOB, currently at rest NYHA IV.  -Very sluggish diuresis on high dose diuretics, so she was taken for RHC yesterday which showed normal CO with markedly elevated filling pressures. Started on renal dose dopamine last night. Minimal UOP 24 hr I/O -640 cc and SBP now 150-170s. Will turn dopamine off since it is not helping and driving her pressures up. Will consult renal likely needs dialysis. GFR 19.  - Will place for CVPs  2.  Resistant hypertension - stopped clonidine yesterday and renal dose dopamine started. SBP 150-170s. As above will stop dopamine. Will increase hydralazine to 37.5 mg TID>  - No ACE-I with CRF.   3. Acute on chronic kidney failure, stage IV - renal dose dopamine started yesterday to see if it would facilitate diuresis, however it has not. Will stop dopamine and consult renal.   4. Fatigue - might be related to her normocytic anemia, most probably of chronic diseases. The patient is being treated with Procrit injections (epoetin).   5. CAD -  -continue medical management.   6. Peripheral arterial disease -  B/L, not significant claudications, possible intervention once kidney function is improved     Signed, Aundria Rud NP-C 11/01/2013  Patient seen and examined with Ulla Potash, NP. We discussed all aspects of the encounter. I agree with the assessment and plan as stated above.   Cardiac cath reviewed with patient. Markedly elevated pressures with normal cardiac output.  No response to dopamine or high dose diuretics. Will likely need HD. Have consulted renal. Can place Trialysis catheter as needed.  Once out of the unit would ask [facility] to assume care.   Berdina Cheever,MD 1:34 PM

## 2013-11-02 LAB — BASIC METABOLIC PANEL
BUN: 24 mg/dL — ABNORMAL HIGH (ref 6–23)
Chloride: 95 mEq/L — ABNORMAL LOW (ref 96–112)
Creatinine, Ser: 1.53 mg/dL — ABNORMAL HIGH (ref 0.50–1.10)
GFR calc Af Amer: 37 mL/min — ABNORMAL LOW (ref >90)
GFR calc non Af Amer: 32 mL/min — ABNORMAL LOW (ref >90)
Potassium: 3.8 mEq/L (ref 3.5–5.1)
Sodium: 133 mEq/L — ABNORMAL LOW (ref 135–145)

## 2013-11-02 LAB — GLUCOSE, CAPILLARY
Glucose-Capillary: 157 mg/dL — ABNORMAL HIGH (ref 70–99)
Glucose-Capillary: 219 mg/dL — ABNORMAL HIGH (ref 70–99)
Glucose-Capillary: 245 mg/dL — ABNORMAL HIGH (ref 70–99)

## 2013-11-02 LAB — HEPATITIS B CORE ANTIBODY, TOTAL: Hep B Core Total Ab: NONREACTIVE

## 2013-11-02 LAB — HEPATITIS B SURFACE ANTIBODY,QUALITATIVE: Hep B S Ab: NEGATIVE

## 2013-11-02 MED ORDER — HYDRALAZINE HCL 50 MG PO TABS
75.0000 mg | ORAL_TABLET | Freq: Three times a day (TID) | ORAL | Status: DC
  Administered 2013-11-02 – 2013-11-03 (×6): 75 mg via ORAL
  Filled 2013-11-02 (×10): qty 1

## 2013-11-02 MED ORDER — INSULIN GLARGINE 100 UNIT/ML ~~LOC~~ SOLN
15.0000 [IU] | Freq: Every day | SUBCUTANEOUS | Status: DC
  Administered 2013-11-03: 15 [IU] via SUBCUTANEOUS
  Filled 2013-11-02 (×3): qty 0.15

## 2013-11-02 MED ORDER — INSULIN ASPART 100 UNIT/ML ~~LOC~~ SOLN
0.0000 [IU] | Freq: Three times a day (TID) | SUBCUTANEOUS | Status: DC
  Administered 2013-11-02: 2 [IU] via SUBCUTANEOUS
  Administered 2013-11-03: 5 [IU] via SUBCUTANEOUS
  Administered 2013-11-04 (×2): 1 [IU] via SUBCUTANEOUS
  Administered 2013-11-05: 2 [IU] via SUBCUTANEOUS
  Administered 2013-11-05: 1 [IU] via SUBCUTANEOUS
  Administered 2013-11-05 – 2013-11-06 (×2): 2 [IU] via SUBCUTANEOUS
  Administered 2013-11-06: 5 [IU] via SUBCUTANEOUS
  Administered 2013-11-09 (×2): 7 [IU] via SUBCUTANEOUS
  Administered 2013-11-09: 3 [IU] via SUBCUTANEOUS

## 2013-11-02 MED ORDER — LOPERAMIDE HCL 2 MG PO CAPS
4.0000 mg | ORAL_CAPSULE | ORAL | Status: DC | PRN
  Administered 2013-11-02: 4 mg via ORAL
  Filled 2013-11-02: qty 2

## 2013-11-02 MED ORDER — INSULIN GLARGINE 100 UNIT/ML ~~LOC~~ SOLN
15.0000 [IU] | Freq: Once | SUBCUTANEOUS | Status: AC
  Administered 2013-11-02: 15 [IU] via SUBCUTANEOUS
  Filled 2013-11-02: qty 0.15

## 2013-11-02 MED ORDER — FUROSEMIDE 10 MG/ML IJ SOLN
120.0000 mg | Freq: Two times a day (BID) | INTRAVENOUS | Status: AC
  Administered 2013-11-02 (×2): 120 mg via INTRAVENOUS
  Filled 2013-11-02 (×2): qty 12

## 2013-11-02 NOTE — Progress Notes (Signed)
Patient ID: Nicole [name], female   DOB: Feb 21, 1935, 78 y.o.   MRN: [id number]  Patient Name: Nicole [name] Date of Encounter: 11/02/2013  SUBJECTIVE  RHC 10/31/13 RA = 16  RV = 57/13/17  PA = 49/21 (35)  PCW = 35 (v=45)  Fick cardiac output/index = 6.6/4.3  FA sat = 91%  PA sat = 72%, 73%  She was started on renal dose dopamine after cath with high dose Lasix and metolazone to try and facilitate diuresis. She had minimal urine output and a dialysis catheter was placed.  Last night, she had hemodialysis with good fluid removal.  CVP remains 12-13.   CURRENT MEDS . amLODipine  10 mg Oral Daily  . aspirin EC  81 mg Oral Daily  . atorvastatin  20 mg Oral q1800  . carvedilol  12.5 mg Oral BID WC  . cholecalciferol  2,000 Units Oral Daily  . enoxaparin (LOVENOX) injection  30 mg Subcutaneous Q24H  . hydrALAZINE  10 mg Intravenous Once  . hydrALAZINE  75 mg Oral TID  . insulin aspart  0-9 Units Subcutaneous TID WC  . insulin glargine  20 Units Subcutaneous QHS  . isosorbide mononitrate  20 mg Oral BID  . levothyroxine  75 mcg Oral QAC breakfast  . pantoprazole  40 mg Oral Daily  . sodium chloride  3 mL Intravenous Q12H       OBJECTIVE  Filed Vitals:   11/02/13 0200 11/02/13 0400 11/02/13 0500 11/02/13 0600  BP: 156/42 153/32  158/26  Pulse: 68 70  66  Temp:  98.2 F (36.8 C)    TempSrc:  Oral    Resp: 16 13  18   Height:      Weight:   138 lb 14.2 oz (63 kg)   SpO2: 96% 97%  94%    Intake/Output Summary (Last 24 hours) at 11/02/13 0744 Last data filed at 11/02/13 0700  Gross per 24 hour  Intake  860.4 ml  Output   3685 ml  Net -2824.6 ml   Filed Weights   11/01/13 2110 11/02/13 0023 11/02/13 0500  Weight: 145 lb 1 oz (65.8 kg) 138 lb 14.2 oz (63 kg) 138 lb 14.2 oz (63 kg)    PHYSICAL EXAM  General: Pleasant, NAD. Lying in bed Neuro: Alert and oriented X 3. Moves all extremities spontaneously. Psych: Normal affect. HEENT:  Normal  Neck: Supple  without bruits, JVD to jaw Lungs:  Resp regular and unlabored, crackles up to the mid lungs B/L Heart: RRR no s3, s4, or murmurs. Abdomen: Soft, non-tender, distended, BS + x 4.  Extremities: No clubbing, cyanosis, bilateral LE 2+ to thighs, 2-3+ bilateral UE.; very tight tender skin,  Radials 2+, DP/DT non-pulpable B/L.  Accessory Clinical Findings  CBC  Recent Labs  10/31/13 2030  WBC 7.1  HGB 12.6  HCT 37.5  MCV 94.2  PLT 245   Basic Metabolic Panel  Recent Labs  10/31/13 0620 10/31/13 2030 11/01/13 0430  NA 139  --  135  K 3.2*  --  4.0  CL 101  --  99  CO2 23  --  22  GLUCOSE 49*  --  275*  BUN 58*  --  58*  CREATININE 2.37* 2.10* 2.33*  CALCIUM 8.9  --  8.7   Cardiac Enzymes  Recent Labs  10/30/13 1112  TROPONINI <0.30   BNP (last 3 results)  Recent Labs  02/13/13 1145 02/14/13 0540 10/29/13 1719  PROBNP 2031.0* 1703.0* 5412.0*  TTE 08/13/2013 Study Conclusions  - Left ventricle: The cavity size was normal. Systolic function was vigorous. The estimated ejection fraction was in the range of 65% to 70%. Wall motion was normal; there were no regional wall motion abnormalities. Features are consistent with a pseudonormal left ventricular filling pattern, with concomitant abnormal relaxation and increased filling pressure (grade 2 diastolic dysfunction). - Mitral valve: Calcified annulus. Mild regurgitation. - Left atrium: The atrium was mildly dilated. - Right atrium: The atrium was mildly dilated. Impressions: - Compared to the prior study, there has been no significant interval change.   Left heart cath 05/08/2011   CONCLUSION:   1. Well-known preserved left ventricular systolic function.  2. Calcified disease of the proximal circumflex coronary artery which  is a dominant vessel of uncertain severity with normal flow  characteristics in a patient with stable symptoms.   DISPOSITION: Based upon the findings of the flow wire, a continued   medical therapy would be recommended. The patient was at moderately  high risk. She is an insulin-dependent diabetic that would likely  require drug-eluting platform. She has a baseline hemoglobin of about  10 today, and she has had a history of proliferative retinopathy as well  as some rectal bleeding on antiplatelet therapy. As she is stable  without current symptoms, a continued approach will be recommended.  Arturo Morton. Riley Kill, MD, [facility]    Radiology/Studies  Dg Chest 2 View  10/29/2013   CLINICAL DATA:  Chest pain.  Shortness of breath.  Fluid overload.  EXAM: CHEST  2 VIEW  COMPARISON:  08/12/2013  FINDINGS: Mild cardiomegaly is stable. Increased diffuse interstitial infiltrates are seen, consistent with interstitial edema. Small bilateral pleural effusions of also increased in size since previous study.  IMPRESSION: Worsening congestive heart failure with increased small bilateral pleural effusions.  TELE:  Sinus bradycardia , 55-60 BPM  ECG SR, normal ECG    ASSESSMENT AND PLAN  78 yo woman with PMH of CKD stage IV, T2DM on insulin, PAD, hypertension, known CAD, acute on chronic HFpEF here with pulmonary edema.  1. Acute on chronic heart failure with preserved ejection fraction: Admitted for weight gain and DOE, found to be in pulmonary edema, she was hospitalized in September and followed every 2 weeks in the clinic since then. She has never reached comfortable state, always SOB, currently at rest NYHA IV.  She was dialyzed last night with good fluid removal and does feel better.   -Very sluggish diuresis on high dose diuretics, so was taken for RHC showing normal CO with markedly elevated filling pressures. Started on renal dose dopamine without improvement. SBP now 150-170s. She is now off dopamine and Lasix gtts.   - CVP 12-13, needs further fluid removal.  Timing of HD per renal, hopefully again later today.   2. Resistant hypertension:  Will increase hydralazine to 75  mg tid today.  - No ACE-I with CRF.   3. ESRD: HD initiated yesterday for volume management.  It may be that after adequate fluid removal, she is able to come off HD.  Will have to see.   4. CAD: continue medical management.   5. Peripheral arterial disease: B/L, stable currently.  Signed, Marca Ancona  11/02/2013

## 2013-11-02 NOTE — Progress Notes (Signed)
Patient ID: Nicole [name], female   DOB: 19-May-1935, 78 y.o.   MRN: [id number]   [facility] Progress Note    Assessment/ Plan:   1. Acute renal failure on chronic kidney disease stage IV: Appears to be secondary to acute cardiorenal syndrome (acute exacerbation of diastolic heart failure).  Status post hemodialysis yesterday-plan for next dialysis again tomorrow. We'll give her some intravenous Lasix today. Unclear as to whether this will be a permanent intermittent hemodialysis for her-she is aware that she may need to undergo dialysis access placement surgery. 2. Acute exacerbation of diastolic heart failure: Refractory to medical management and started on dialysis. Plan on Lasix bolus today. Unclear if she'll have significant renal recovery to be able to stay off of dialysis.  3. Anemia of chronic kidney disease: Hemoglobin currently at goal-she apparently has had an "adverse reaction" to Procrit after which she gets diffuse arthralgias and myalgias.  4. Hypertension: On antihypertensive agents and anticipate will improve further with ultrafiltration on hemodialysis.  Subjective:   Reports that she tolerated dialysis well yesterday-denies any chest pain and has some shortness of breath.    Objective:   BP 158/26  Pulse 66  Temp(Src) 98.2 F (36.8 C) (Oral)  Resp 18  Ht 5\' 4"  (1.626 m)  Wt 63 kg (138 lb 14.2 oz)  BMI 23.83 kg/m2  SpO2 94%  Intake/Output Summary (Last 24 hours) at 11/02/13 0809 Last data filed at 11/02/13 0700  Gross per 24 hour  Intake  821.7 ml  Output   3585 ml  Net -2763.3 ml   Weight change: -0.1 kg (-3.5 oz)  Physical Exam: Gen: Comfortably sitting on the side of her bed eating breakfast CVS: Pulse regular in rate and rhythm, S1 and S2 normal Resp: Fine rales left base otherwise clear to auscultation Abd: Soft, flat, nontender and bowel sounds are normal Ext: No lower extremity edema  Imaging: Dg Chest Port 1 View  11/01/2013    CLINICAL DATA:  Follow-up pleural effusion  EXAM: PORTABLE CHEST - 1 VIEW  COMPARISON:  10/29/2013  FINDINGS: Cardiomediastinal silhouette is stable. There is bilateral small pleural effusion with bilateral basilar atelectasis or infiltrate. Central mild vascular congestion and mild interstitial prominence suspicious for pulmonary edema. Atherosclerotic calcifications of thoracic aorta again noted. Dual lumen right IJ catheter with tip in the region of SVC right atrium junction.  IMPRESSION: There is bilateral small pleural effusion with bilateral basilar atelectasis or infiltrate. Central mild vascular congestion and mild interstitial prominence suspicious for pulmonary edema. Atherosclerotic calcifications of thoracic aorta again noted. Dual lumen right IJ catheter with tip in the region of SVC right atrium junction.   Electronically Signed   By: Natasha Mead M.D.   On: 11/01/2013 15:52    Labs: BMET  Recent Labs Lab 10/29/13 1719 10/29/13 2320 10/30/13 0610 10/31/13 0620 10/31/13 2030 11/01/13 0430  NA 134*  --  136 139  --  135  K 3.9  --  3.8 3.2*  --  4.0  CL 97  --  99 101  --  99  CO2 20  --  18* 23  --  22  GLUCOSE 401*  --  317* 49*  --  275*  BUN 52*  --  54* 58*  --  58*  CREATININE 2.01* 2.01* 2.03* 2.37* 2.10* 2.33*  CALCIUM 8.6  --  8.9 8.9  --  8.7   CBC  Recent Labs Lab 10/29/13 1719 10/29/13 2320 10/31/13 2030  WBC 6.2  5.5 7.1  HGB 12.4 11.9* 12.6  HCT 36.2 35.8* 37.5  MCV 93.3 95.7 94.2  PLT 217 193 245    Medications:    . amLODipine  10 mg Oral Daily  . aspirin EC  81 mg Oral Daily  . atorvastatin  20 mg Oral q1800  . carvedilol  12.5 mg Oral BID WC  . cholecalciferol  2,000 Units Oral Daily  . enoxaparin (LOVENOX) injection  30 mg Subcutaneous Q24H  . hydrALAZINE  10 mg Intravenous Once  . hydrALAZINE  75 mg Oral TID  . insulin aspart  0-9 Units Subcutaneous TID WC  . insulin glargine  20 Units Subcutaneous QHS  . isosorbide mononitrate  20 mg Oral  BID  . levothyroxine  75 mcg Oral QAC breakfast  . pantoprazole  40 mg Oral Daily  . sodium chloride  3 mL Intravenous Q12H   Zetta Bills, MD 11/02/2013, 8:09 AM

## 2013-11-03 LAB — CBC
HCT: 35.3 % — ABNORMAL LOW (ref 36.0–46.0)
Hemoglobin: 11.9 g/dL — ABNORMAL LOW (ref 12.0–15.0)
MCV: 94.1 fL (ref 78.0–100.0)
Platelets: 191 10*3/uL (ref 150–400)
RBC: 3.75 MIL/uL — ABNORMAL LOW (ref 3.87–5.11)
RDW: 15 % (ref 11.5–15.5)
WBC: 7.4 10*3/uL (ref 4.0–10.5)

## 2013-11-03 LAB — RENAL FUNCTION PANEL
Albumin: 2.6 g/dL — ABNORMAL LOW (ref 3.5–5.2)
BUN: 29 mg/dL — ABNORMAL HIGH (ref 6–23)
CO2: 26 mEq/L (ref 19–32)
Calcium: 8.4 mg/dL (ref 8.4–10.5)
Chloride: 97 mEq/L (ref 96–112)
GFR calc non Af Amer: 26 mL/min — ABNORMAL LOW (ref >90)
Potassium: 3.5 mEq/L (ref 3.5–5.1)

## 2013-11-03 LAB — GLUCOSE, CAPILLARY
Glucose-Capillary: 120 mg/dL — ABNORMAL HIGH (ref 70–99)
Glucose-Capillary: 265 mg/dL — ABNORMAL HIGH (ref 70–99)
Glucose-Capillary: 120 mg/dL — ABNORMAL HIGH (ref 70–99)
Glucose-Capillary: 199 mg/dL — ABNORMAL HIGH (ref 70–99)

## 2013-11-03 MED ORDER — CYCLOBENZAPRINE HCL 10 MG PO TABS
5.0000 mg | ORAL_TABLET | Freq: Once | ORAL | Status: AC | PRN
  Administered 2013-11-03: 5 mg via ORAL
  Filled 2013-11-03: qty 1

## 2013-11-03 NOTE — Progress Notes (Signed)
Patient ID: Nicole [name], female   DOB: 1935/02/23, 78 y.o.   MRN: [id number]   [facility] Progress Note    Assessment/ Plan:   1. Acute renal failure on chronic kidney disease stage IV: Appears to be secondary to acute cardiorenal syndrome (acute exacerbation of diastolic heart failure). UOP in response to 240mg  lasix yesterday. Plan for HD today and again tomorrow for UF.  2. Acute exacerbation of diastolic heart failure: Refractory to medical management and started on dialysis. Unclear if she'll have significant renal recovery to be able to stay off of dialysis.  3. Anemia of chronic kidney disease: Hemoglobin currently at goal-she apparently has had an "adverse reaction" to Procrit after which she gets diffuse arthralgias and myalgias.  4. Hypertension: On antihypertensive agents and anticipate will improve further with ultrafiltration on hemodialysis   Subjective:   Reports shortness of breath- concerned that she may not be able to tolerate a 4hr dialysis treatment   Objective:   BP 175/49  Pulse 69  Temp(Src) 98.5 F (36.9 C) (Oral)  Resp 17  Ht 5\' 4"  (1.626 m)  Wt 63.866 kg (140 lb 12.8 oz)  BMI 24.16 kg/m2  SpO2 96%  Intake/Output Summary (Last 24 hours) at 11/03/13 0818 Last data filed at 11/03/13 0600  Gross per 24 hour  Intake    422 ml  Output    900 ml  Net   -478 ml   Weight change: -1.933 kg (-4 lb 4.2 oz)  Physical Exam: QIO:[id number] sitting up in recliner- eating breakfast GMW:NUUVO RRR, normal S1 and S2  Resp:Fine rales bibasally, no wheeze ZDG:UYQI, obese, NT Ext: 1 +-2+ LE edema  Imaging: Dg Chest Port 1 View  11/01/2013   CLINICAL DATA:  Follow-up pleural effusion  EXAM: PORTABLE CHEST - 1 VIEW  COMPARISON:  10/29/2013  FINDINGS: Cardiomediastinal silhouette is stable. There is bilateral small pleural effusion with bilateral basilar atelectasis or infiltrate. Central mild vascular congestion and mild interstitial  prominence suspicious for pulmonary edema. Atherosclerotic calcifications of thoracic aorta again noted. Dual lumen right IJ catheter with tip in the region of SVC right atrium junction.  IMPRESSION: There is bilateral small pleural effusion with bilateral basilar atelectasis or infiltrate. Central mild vascular congestion and mild interstitial prominence suspicious for pulmonary edema. Atherosclerotic calcifications of thoracic aorta again noted. Dual lumen right IJ catheter with tip in the region of SVC right atrium junction.   Electronically Signed   By: Natasha Mead M.D.   On: 11/01/2013 15:52    Labs: BMET  Recent Labs Lab 10/29/13 1719 10/29/13 2320 10/30/13 0610 10/31/13 0620 10/31/13 2030 11/01/13 0430 11/02/13 0800 11/03/13 0500  NA 134*  --  136 139  --  135 133* 134*  K 3.9  --  3.8 3.2*  --  4.0 3.8 3.5  CL 97  --  99 101  --  99 95* 97  CO2 20  --  18* 23  --  22 26 26   GLUCOSE 401*  --  317* 49*  --  275* 255* 115*  BUN 52*  --  54* 58*  --  58* 24* 29*  CREATININE 2.01* 2.01* 2.03* 2.37* 2.10* 2.33* 1.53* 1.80*  CALCIUM 8.6  --  8.9 8.9  --  8.7 8.4 8.4  PHOS  --   --   --   --   --   --   --  3.3   CBC  Recent Labs Lab 10/29/13 1719 10/29/13 2320  10/31/13 2030 11/03/13 0500  WBC 6.2 5.5 7.1 7.4  HGB 12.4 11.9* 12.6 11.9*  HCT 36.2 35.8* 37.5 35.3*  MCV 93.3 95.7 94.2 94.1  PLT 217 193 245 191   Medications:    . amLODipine  10 mg Oral Daily  . aspirin EC  81 mg Oral Daily  . atorvastatin  20 mg Oral q1800  . carvedilol  12.5 mg Oral BID WC  . cholecalciferol  2,000 Units Oral Daily  . enoxaparin (LOVENOX) injection  30 mg Subcutaneous Q24H  . hydrALAZINE  75 mg Oral TID  . insulin aspart  0-9 Units Subcutaneous TID WC  . insulin glargine  15 Units Subcutaneous QHS  . isosorbide mononitrate  20 mg Oral BID  . levothyroxine  75 mcg Oral QAC breakfast  . pantoprazole  40 mg Oral Daily  . sodium chloride  3 mL Intravenous Q12H   Zetta Bills,  MD 11/03/2013, 8:18 AM

## 2013-11-03 NOTE — Progress Notes (Signed)
Patient ID: Nicole [name], female   DOB: 1935/02/27, 78 y.o.   MRN: [id number]  Patient Name: Nicole [name] Date of Encounter: 11/03/2013  SUBJECTIVE  RHC 10/31/13 RA = 16  RV = 57/13/17  PA = 49/21 (35)  PCW = 35 (v=45)  Fick cardiac output/index = 6.6/4.3  FA sat = 91%  PA sat = 72%, 73%  She was started on renal dose dopamine after cath with high dose Lasix and metolazone to try and facilitate diuresis. She had minimal urine output and a dialysis catheter was placed.  Wednesday night, she had hemodialysis with good fluid removal.  CVP remains 12 this morning.  Got Lasix boluses yesterday with UOP 900 cc total.  Plan for HD today.  Remains dyspneic.   CURRENT MEDS . amLODipine  10 mg Oral Daily  . aspirin EC  81 mg Oral Daily  . atorvastatin  20 mg Oral q1800  . carvedilol  12.5 mg Oral BID WC  . cholecalciferol  2,000 Units Oral Daily  . enoxaparin (LOVENOX) injection  30 mg Subcutaneous Q24H  . hydrALAZINE  75 mg Oral TID  . insulin aspart  0-9 Units Subcutaneous TID WC  . insulin glargine  15 Units Subcutaneous QHS  . isosorbide mononitrate  20 mg Oral BID  . levothyroxine  75 mcg Oral QAC breakfast  . pantoprazole  40 mg Oral Daily  . sodium chloride  3 mL Intravenous Q12H       OBJECTIVE  Filed Vitals:   11/02/13 1610 11/02/13 2012 11/03/13 0500 11/03/13 0526  BP:  163/37  175/49  Pulse:  69    Temp: 98.3 F (36.8 C) 98.5 F (36.9 C)    TempSrc: Oral Oral    Resp:  14  17  Height:      Weight:   140 lb 12.8 oz (63.866 kg)   SpO2: 97% 96%      Intake/Output Summary (Last 24 hours) at 11/03/13 0700 Last data filed at 11/03/13 0600  Gross per 24 hour  Intake    452 ml  Output    900 ml  Net   -448 ml   Filed Weights   11/02/13 0023 11/02/13 0500 11/03/13 0500  Weight: 138 lb 14.2 oz (63 kg) 138 lb 14.2 oz (63 kg) 140 lb 12.8 oz (63.866 kg)    PHYSICAL EXAM  General: Pleasant, NAD. Lying in bed Neuro: Alert and oriented X 3. Moves all  extremities spontaneously. Psych: Normal affect. HEENT:  Normal  Neck: Supple without bruits, JVD to jaw Lungs:  Resp regular and unlabored, crackles up to the mid lungs B/L Heart: RRR no s3, s4, or murmurs. Abdomen: Soft, non-tender, distended, BS + x 4.  Extremities: No clubbing, cyanosis, bilateral LE 2+ to thighs, 2-3+ bilateral UE.; very tight tender skin,  Radials 2+, DP/DT non-pulpable B/L.  Accessory Clinical Findings  CBC  Recent Labs  10/31/13 2030 11/03/13 0500  WBC 7.1 7.4  HGB 12.6 11.9*  HCT 37.5 35.3*  MCV 94.2 94.1  PLT 245 191   Basic Metabolic Panel  Recent Labs  11/01/13 0430 11/02/13 0800  NA 135 133*  K 4.0 3.8  CL 99 95*  CO2 22 26  GLUCOSE 275* 255*  BUN 58* 24*  CREATININE 2.33* 1.53*  CALCIUM 8.7 8.4   Cardiac Enzymes No results found for this basename: CKTOTAL, CKMB, CKMBINDEX, TROPONINI,  in the last 72 hours BNP (last 3 results)  Recent Labs  02/13/13 1145 02/14/13 0540  10/29/13 1719  PROBNP 2031.0* 1703.0* 5412.0*   TTE 08/13/2013 Study Conclusions  - Left ventricle: The cavity size was normal. Systolic function was vigorous. The estimated ejection fraction was in the range of 65% to 70%. Wall motion was normal; there were no regional wall motion abnormalities. Features are consistent with a pseudonormal left ventricular filling pattern, with concomitant abnormal relaxation and increased filling pressure (grade 2 diastolic dysfunction). - Mitral valve: Calcified annulus. Mild regurgitation. - Left atrium: The atrium was mildly dilated. - Right atrium: The atrium was mildly dilated. Impressions: - Compared to the prior study, there has been no significant interval change.   Left heart cath 05/08/2011   CONCLUSION:   1. Well-known preserved left ventricular systolic function.  2. Calcified disease of the proximal circumflex coronary artery which  is a dominant vessel of uncertain severity with normal flow   characteristics in a patient with stable symptoms.   DISPOSITION: Based upon the findings of the flow wire, a continued  medical therapy would be recommended. The patient was at moderately  high risk. She is an insulin-dependent diabetic that would likely  require drug-eluting platform. She has a baseline hemoglobin of about  10 today, and she has had a history of proliferative retinopathy as well  as some rectal bleeding on antiplatelet therapy. As she is stable  without current symptoms, a continued approach will be recommended.  Nicole [name]. Riley Kill, MD, [facility]    Radiology/Studies  Dg Chest 2 View  10/29/2013   CLINICAL DATA:  Chest pain.  Shortness of breath.  Fluid overload.  EXAM: CHEST  2 VIEW  COMPARISON:  08/12/2013  FINDINGS: Mild cardiomegaly is stable. Increased diffuse interstitial infiltrates are seen, consistent with interstitial edema. Small bilateral pleural effusions of also increased in size since previous study.  IMPRESSION: Worsening congestive heart failure with increased small bilateral pleural effusions.  TELE:  Sinus bradycardia , 55-60 BPM  ECG SR, normal ECG    ASSESSMENT AND PLAN  78 yo woman with PMH of CKD stage IV, T2DM on insulin, PAD, hypertension, known CAD, acute on chronic HFpEF here with pulmonary edema.  1. Acute on chronic heart failure with preserved ejection fraction: Admitted for weight gain and DOE, found to be in pulmonary edema, she was hospitalized in September and followed every 2 weeks in the clinic since then. She has never reached comfortable state, always SOB, currently at rest NYHA IV.  She was dialyzed Wednesday, received IV Lasix boluses yesterday with about 900 cc UOP.    -Very sluggish diuresis on high dose diuretics, so was taken for RHC showing normal CO with markedly elevated filling pressures. Started on renal dose dopamine without improvement. She is now off dopamine and Lasix gtts.   - JVP remains elevated, needs further  fluid removal.  UOP remained sluggish (900 cc) yesterday with high dose Lasix boluses. Plan for HD today.   2. Resistant hypertension:  Increase Coreg to 18.75 mg bid.  Hopefully will improve with fluid removal via UF.  - No ACE-I with CRF.   3. ESRD: HD initiated for volume management.  It may be that after adequate fluid removal, she is able to come off HD.  Will have to see.   4. CAD: continue medical management.   5. Peripheral arterial disease: B/L, stable currently.  Signed, Marca Ancona  11/03/2013

## 2013-11-03 NOTE — Progress Notes (Signed)
Seen and agree with SPT note Mahli Glahn Tabor Roy Tokarz, PT [phone]  

## 2013-11-03 NOTE — Progress Notes (Signed)
Physical Therapy Treatment Patient Details Name: Nicole [name] MRN: [id number] DOB: 1935-08-10 Today's Date: 11/03/2013 Time: 4098-1191 PT Time Calculation (min): 34 min  PT Assessment / Plan / Recommendation  History of Present Illness Pt admit with CHF.    PT Comments   Patient transfers from sitting to standing with reported weakness and pain resulting in decreased speed and safety.  Physical assistance required for trunk elevation for transfer from low armchair on arrival. Vitals stable throughout, reports back and hip pain throughout session.   Follow Up Recommendations  SNF;Supervision/Assistance - 24 hour     Does the patient have the potential to tolerate intense rehabilitation     Barriers to Discharge        Equipment Recommendations  None recommended by PT    Recommendations for Other Services    Frequency Min 3X/week   Progress towards PT Goals Progress towards PT goals: Goals met and updated - see care plan  Plan Current plan remains appropriate    Precautions / Restrictions Precautions Precautions: Fall Restrictions Weight Bearing Restrictions: No   Pertinent Vitals/Pain Ambulated on 3L O2 with 0/4 dyspnea; Hr 66-74; reports pain 10/10 for low back initially, 8/10 end of session;   Mobility  Bed Mobility Bed Mobility: Not assessed Transfers Transfers: Sit to Stand;Stand to Sit Sit to Stand: 4: Min assist;With upper extremity assist;From chair/3-in-1 Stand to Sit: 4: Min assist;With upper extremity assist;To chair/3-in-1 Details for Transfer Assistance: cues for anterior translation, hand placement, assist needed to lift hips from low chair Ambulation/Gait Ambulation/Gait Assistance: 4: Min assist Ambulation Distance (Feet): 120 Feet Assistive device: Rolling walker Ambulation/Gait Assistance Details: cues to use UE to offload painfulleft hip; assist for safety Gait Pattern: Decreased stride length;Step-through pattern;Decreased weight shift to  left Gait velocity: decreased General Gait Details: pt c/o pain in stance phase especially in left hip; walks slowly with increased weight to right LE Stairs: No    Exercises General Exercises - Lower Extremity Long Arc Quad: AROM;Both;15 reps Hip ABduction/ADduction: AROM;Both;15 reps;Seated Hip Flexion/Marching: AROM;Both;15 reps;Seated Toe Raises: AROM;Both;15 reps;Seated Heel Raises: AROM;Both;15 reps;Seated Other Exercises Other Exercises: sit to stand from recliner with UE x3   PT Diagnosis:    PT Problem List:   PT Treatment Interventions:     PT Goals (current goals can now be found in the care plan section) Acute Rehab PT Goals Patient Stated Goal: to get better  Visit Information  Last PT Received On: 11/03/13 Assistance Needed: +1 History of Present Illness: Pt admit with CHF.     Subjective Data  Patient Stated Goal: to get better   Cognition  Cognition Arousal/Alertness: Awake/alert Behavior During Therapy: WFL for tasks assessed/performed Overall Cognitive Status: Within Functional Limits for tasks assessed    Balance     End of Session PT - End of Session Equipment Utilized During Treatment: Gait belt;Oxygen Activity Tolerance: Patient limited by pain Patient left: in chair;with call bell/phone within reach Nurse Communication: Mobility status   GP     Willette Pa, SPT 11/03/2013, 12:54 PM

## 2013-11-03 NOTE — Procedures (Signed)
Patient seen on Hemodialysis. QB 300, UF goal 3L Treatment adjusted as needed.  Zetta Bills MD [facility]. Office # [phone] Pager # [phone] 2:26 PM

## 2013-11-03 NOTE — Progress Notes (Signed)
Md notified of foley and md ordered to leave foley in for now. Assessing kidney function and we need accurate I/O's  Michelle Wnek, Charlaine Dalton RN

## 2013-11-04 ENCOUNTER — Encounter (HOSPITAL_COMMUNITY): Payer: Self-pay | Admitting: Radiology

## 2013-11-04 LAB — GLUCOSE, CAPILLARY
Glucose-Capillary: 185 mg/dL — ABNORMAL HIGH (ref 70–99)
Glucose-Capillary: 111 mg/dL — ABNORMAL HIGH (ref 70–99)
Glucose-Capillary: 41 mg/dL — CL (ref 70–99)
Glucose-Capillary: 58 mg/dL — ABNORMAL LOW (ref 70–99)
Glucose-Capillary: 108 mg/dL — ABNORMAL HIGH (ref 70–99)
Glucose-Capillary: 141 mg/dL — ABNORMAL HIGH (ref 70–99)
Glucose-Capillary: 130 mg/dL — ABNORMAL HIGH (ref 70–99)

## 2013-11-04 LAB — RENAL FUNCTION PANEL
Albumin: 2.3 g/dL — ABNORMAL LOW (ref 3.5–5.2)
BUN: 15 mg/dL (ref 6–23)
Calcium: 8.1 mg/dL — ABNORMAL LOW (ref 8.4–10.5)
Creatinine, Ser: 1.5 mg/dL — ABNORMAL HIGH (ref 0.50–1.10)
GFR calc Af Amer: 38 mL/min — ABNORMAL LOW (ref >90)
GFR calc non Af Amer: 33 mL/min — ABNORMAL LOW (ref >90)
Sodium: 138 mEq/L (ref 135–145)

## 2013-11-04 LAB — CBC
Hemoglobin: 11.5 g/dL — ABNORMAL LOW (ref 12.0–15.0)
MCH: 31.8 pg (ref 26.0–34.0)
MCHC: 33.1 g/dL (ref 30.0–36.0)
MCV: 95.9 fL (ref 78.0–100.0)
Platelets: 154 10*3/uL (ref 150–400)
RDW: 14.9 % (ref 11.5–15.5)

## 2013-11-04 LAB — BASIC METABOLIC PANEL
BUN: 14 mg/dL (ref 6–23)
Calcium: 7.2 mg/dL — ABNORMAL LOW (ref 8.4–10.5)
Chloride: 104 mEq/L (ref 96–112)
Creatinine, Ser: 1.47 mg/dL — ABNORMAL HIGH (ref 0.50–1.10)
GFR calc Af Amer: 39 mL/min — ABNORMAL LOW (ref >90)
GFR calc non Af Amer: 33 mL/min — ABNORMAL LOW (ref >90)
Glucose, Bld: 115 mg/dL — ABNORMAL HIGH (ref 70–99)
Potassium: 3.4 mEq/L — ABNORMAL LOW (ref 3.5–5.1)

## 2013-11-04 MED ORDER — HEPARIN SODIUM (PORCINE) 1000 UNIT/ML DIALYSIS: 3000.0000 [IU] | INTRAMUSCULAR | Status: DC | PRN

## 2013-11-04 MED ORDER — INSULIN GLARGINE 100 UNIT/ML ~~LOC~~ SOLN
12.0000 [IU] | Freq: Every day | SUBCUTANEOUS | Status: DC
  Administered 2013-11-04 – 2013-11-09 (×5): 12 [IU] via SUBCUTANEOUS
  Filled 2013-11-04 (×8): qty 0.12

## 2013-11-04 MED ORDER — GLUCOSE 40 % PO GEL
ORAL | Status: AC
  Filled 2013-11-04: qty 1

## 2013-11-04 MED ORDER — HYDRALAZINE HCL 50 MG PO TABS
100.0000 mg | ORAL_TABLET | Freq: Three times a day (TID) | ORAL | Status: DC
  Administered 2013-11-04 – 2013-11-09 (×14): 100 mg via ORAL
  Filled 2013-11-04 (×21): qty 2

## 2013-11-04 MED ORDER — CEFAZOLIN SODIUM-DEXTROSE 2-3 GM-% IV SOLR: 2.0000 g | INTRAVENOUS | Status: DC

## 2013-11-04 MED ORDER — GLUCOSE 40 % PO GEL
ORAL | Status: AC
  Administered 2013-11-04: 1
  Filled 2013-11-04: qty 1

## 2013-11-04 NOTE — Progress Notes (Signed)
Patient ID: Nicole [name], female   DOB: 02-13-1935, 78 y.o.   MRN: [id number]  [facility] Progress Note    Assessment/ Plan:   1. Acute renal failure on chronic kidney disease stage IV: Appears to be secondary to acute cardiorenal syndrome (acute exacerbation of diastolic heart failure). Plan for HD today- suspect progression to ESRD- will convert to [facility] early next week and get vascular surgery to evaluate her for permanent access.  2. Acute exacerbation of diastolic heart failure: Refractory to medical management and started on dialysis. Not optimistic regarding renal recovery (baseline CKD4).  3. Anemia of chronic kidney disease: Hemoglobin currently at goal-she apparently has had an "adverse reaction" to Procrit after which she gets diffuse arthralgias and myalgias.  4. Hypertension: Will adjust antihypertensive agents and anticipate will improve further with ultrafiltration on hemodialysis  Subjective:   Hypoglycemic this AM- tired. Denies CP and SOB better   Objective:   BP 175/35  Pulse 62  Temp(Src) 97.5 F (36.4 C) (Oral)  Resp 13  Ht 5\' 4"  (1.626 m)  Wt 59.591 kg (131 lb 6 oz)  BMI 22.54 kg/m2  SpO2 95%  Intake/Output Summary (Last 24 hours) at 11/04/13 0833 Last data filed at 11/04/13 0600  Gross per 24 hour  Intake    140 ml  Output   2080 ml  Net  -1940 ml   Weight change: 0.134 kg (4.7 oz)  Physical Exam: MVH:[id number] resting in bed XLK:GMWNU RRR, normal S1 and S2 Resp:Fine rales over both bases, no wheeze UVO:ZDGU, flat, NT, BS normal Ext: Trace leg edema, 2+ thigh edema  Imaging: No results found.  Labs: BMET  Recent Labs Lab 10/29/13 1719  10/30/13 0610 10/31/13 0620 10/31/13 2030 11/01/13 0430 11/02/13 0800 11/03/13 0500 11/04/13 0416  NA 134*  --  136 139  --  135 133* 134* 138  K 3.9  --  3.8 3.2*  --  4.0 3.8 3.5 3.6  CL 97  --  99 101  --  99 95* 97 101  CO2 20  --  18* 23  --  22 26 26 29   GLUCOSE 401*  --   317* 49*  --  275* 255* 115* 143*  BUN 52*  --  54* 58*  --  58* 24* 29* 15  CREATININE 2.01*  < > 2.03* 2.37* 2.10* 2.33* 1.53* 1.80* 1.50*  CALCIUM 8.6  --  8.9 8.9  --  8.7 8.4 8.4 8.1*  PHOS  --   --   --   --   --   --   --  3.3 2.1*  < > = values in this interval not displayed. CBC  Recent Labs Lab 10/29/13 2320 10/31/13 2030 11/03/13 0500 11/04/13 0416  WBC 5.5 7.1 7.4 6.9  HGB 11.9* 12.6 11.9* 11.5*  HCT 35.8* 37.5 35.3* 34.7*  MCV 95.7 94.2 94.1 95.9  PLT 193 245 191 154   Medications:    . amLODipine  10 mg Oral Daily  . aspirin EC  81 mg Oral Daily  . atorvastatin  20 mg Oral q1800  . carvedilol  12.5 mg Oral BID WC  . cholecalciferol  2,000 Units Oral Daily  . dextrose      . enoxaparin (LOVENOX) injection  30 mg Subcutaneous Q24H  . hydrALAZINE  75 mg Oral TID  . insulin aspart  0-9 Units Subcutaneous TID WC  . insulin glargine  15 Units Subcutaneous QHS  . isosorbide mononitrate  20 mg  Oral BID  . levothyroxine  75 mcg Oral QAC breakfast  . pantoprazole  40 mg Oral Daily  . sodium chloride  3 mL Intravenous Q12H   Zetta Bills, MD 11/04/2013, 8:33 AM

## 2013-11-04 NOTE — Progress Notes (Signed)
DR. Allena Katz HERE.  AWARE OF HYPOGLYCEMIC EVENT THIS AM. REPEAT CBG = 58. PT. REFUSED GLUCOGEL.  WILL CONTINUE TO MONITOR.

## 2013-11-04 NOTE — Consult Note (Signed)
HPI: Nicole [name] is an 78 y.o. female admitted with acute on chronic heart failure which has also now tipped her CKD to ESRD. She is currently having HD via a Rt IJ temp cath, but Dr. Allena Katz expects she will need longer term HD. IR is asked to place a tunneled HD catheter. Chart, PMHx, and meds reviewed. She is for more HD today. No apparent infectious issues.  Past Medical History:  Past Medical History  Diagnosis Date  . Retinopathy   . Hypertension   . Chronic diastolic CHF (congestive heart failure)     has a normal EF per echo 03/2011 & 5/2013with diastolic dysfunction  . CAD (coronary artery disease)     Prior PCI in 1998 x 2; s/p cath in 2001, negative Myoview in 2008 & 2011, s/p cath in June 2012 with calcified disease of the proximal LCX with normal flow reserve. She has been managed medically due to her other morbidities  . Cerebrovascular disease   . Peripheral vascular disease     prior angiography showing RSFA stenosis, left anterior tibial and left posterior tibial stenoses  . Hypercholesterolemia   . Diabetes mellitus     insulin dependent  . Hypothyroidism   . Hiatal hernia   . Degenerative joint disease   . Osteoporosis   . Vitamin D deficiency   . Anemia of chronic disease   . Renal insufficiency   . Shortness of breath   . Anginal pain     Past Surgical History:  Past Surgical History  Procedure Laterality Date  . Abdominal aortogram  07/27/2003    by Dr. Chales Abrahams  . Bilateral lower extremity angiogram  07/27/2003    by Dr. Chales Abrahams  . Selective angiography,right superficial femoral artery, right and left iliac arteries  07/27/2003    by Dr. Chales Abrahams  . Measurement of gradient right and left iliac arteries  07/27/2003    by Dr. Chales Abrahams  . Bilateral cataract sugery  2000 and 2002    by Dr. Cecilie Kicks  . Repair left hip fracture  04/2008    by Dr. Charlann Boxer    Family History:  Family History  Problem Relation Age of Onset  . Heart disease Mother   . Cancer Mother    . Cancer Father     Social History:  reports that she quit smoking about 38 years ago. Her smoking use included Cigarettes. She smoked 0.00 packs per day. She has never used smokeless tobacco. She reports that she does not drink alcohol or use illicit drugs.  Allergies:  Allergies  Allergen Reactions  . Ibuprofen     REACTION: edema---thyroid problems  . Prednisone     Causes severe fluid retention    Medications:   Medication List    ASK your doctor about these medications       acetaminophen 500 MG tablet  Commonly known as:  TYLENOL  Take 500 mg by mouth every 6 (six) hours as needed for pain.     amLODipine 10 MG tablet  Commonly known as:  NORVASC  Take 1 tablet (10 mg total) by mouth daily.     aspirin EC 81 MG tablet  Take 81 mg by mouth daily.     atorvastatin 20 MG tablet  Commonly known as:  LIPITOR  Take 1 tablet (20 mg total) by mouth daily at 6 PM.     carvedilol 12.5 MG tablet  Commonly known as:  COREG  Take 1 tablet (12.5 mg total) by mouth  2 (two) times daily with a meal.     cloNIDine 0.1 MG tablet  Commonly known as:  CATAPRES  Take 0.1 mg by mouth daily at 10 pm.     furosemide 40 MG tablet  Commonly known as:  LASIX  Take 40 mg twice a day.     hydrALAZINE 25 MG tablet  Commonly known as:  APRESOLINE  Take 37.5 mg by mouth 3 (three) times daily.     insulin glargine 100 UNIT/ML injection  Commonly known as:  LANTUS  Inject 0.2 mLs (20 Units total) into the skin at bedtime.     insulin lispro 100 UNIT/ML injection  Commonly known as:  HUMALOG  Inject 0-10 Units into the skin 3 (three) times daily before meals. Home sliding scale     isosorbide mononitrate 20 MG tablet  Commonly known as:  ISMO,MONOKET  Take 1 tablet (20 mg total) by mouth 2 (two) times daily.     levothyroxine 75 MCG tablet  Commonly known as:  SYNTHROID  Take 1 tablet (75 mcg total) by mouth daily.     nitroGLYCERIN 0.4 MG SL tablet  Commonly known as:   NITROSTAT  Place 0.4 mg under the tongue every 5 (five) minutes as needed for chest pain.     pantoprazole 40 MG tablet  Commonly known as:  PROTONIX  Take 1 tablet (40 mg total) by mouth daily.     traMADol 50 MG tablet  Commonly known as:  ULTRAM  Take 1 tablet (50 mg total) by mouth every 6 (six) hours as needed for pain.     Vitamin D 1000 UNITS capsule  Take 2,000 Units by mouth daily.        Please HPI for pertinent positives, otherwise complete 10 system ROS negative.  Physical Exam: BP 175/35  Pulse 62  Temp(Src) 97.5 F (36.4 C) (Oral)  Resp 13  Ht 5\' 4"  (1.626 m)  Wt 131 lb 6 oz (59.591 kg)  BMI 22.54 kg/m2  SpO2 95% Body mass index is 22.54 kg/(m^2).   General Appearance:  Alert, cooperative, no distress, appears stated age  ENT: Unremarkable airway  Neck: Supple, trachea midline  Lungs:   Unlabored breathing. (B)crackles lower regions.  Chest Wall:  No tenderness or deformity  Heart:  Regular rate and rhythm, S1, S2 normal, no murmur, rub or gallop.  Abdomen:   Soft, non-tender, non distended.  Extremities: Extremities normal, no cyanosis. Trace edema  Pulses: 2+ and symmetric  Neurologic: Normal affect, no gross deficits.   Results for orders placed during the hospital encounter of 10/29/13 (from the past 48 hour(s))  GLUCOSE, CAPILLARY     Status: Abnormal   Collection Time    11/02/13 11:49 AM      Result Value Range   Glucose-Capillary 245 (*) 70 - 99 mg/dL  CBC     Status: Abnormal   Collection Time    11/03/13  5:00 AM      Result Value Range   WBC 7.4  4.0 - 10.5 K/uL   RBC 3.75 (*) 3.87 - 5.11 MIL/uL   Hemoglobin 11.9 (*) 12.0 - 15.0 g/dL   HCT 16.1 (*) 09.6 - 04.5 %   MCV 94.1  78.0 - 100.0 fL   MCH 31.7  26.0 - 34.0 pg   MCHC 33.7  30.0 - 36.0 g/dL   RDW 40.9  81.1 - 91.4 %   Platelets 191  150 - 400 K/uL  RENAL FUNCTION PANEL  Status: Abnormal   Collection Time    11/03/13  5:00 AM      Result Value Range   Sodium 134 (*) 135  - 145 mEq/L   Potassium 3.5  3.5 - 5.1 mEq/L   Chloride 97  96 - 112 mEq/L   CO2 26  19 - 32 mEq/L   Glucose, Bld 115 (*) 70 - 99 mg/dL   BUN 29 (*) 6 - 23 mg/dL   Creatinine, Ser 1.61 (*) 0.50 - 1.10 mg/dL   Calcium 8.4  8.4 - 09.6 mg/dL   Phosphorus 3.3  2.3 - 4.6 mg/dL   Albumin 2.6 (*) 3.5 - 5.2 g/dL   GFR calc non Af Amer 26 (*) >90 mL/min   GFR calc Af Amer 30 (*) >90 mL/min   Comment: (NOTE)     The eGFR has been calculated using the CKD EPI equation.     This calculation has not been validated in all clinical situations.     eGFR's persistently <90 mL/min signify possible Chronic Kidney     Disease.  GLUCOSE, CAPILLARY     Status: Abnormal   Collection Time    11/03/13  7:07 AM      Result Value Range   Glucose-Capillary 120 (*) 70 - 99 mg/dL  GLUCOSE, CAPILLARY     Status: Abnormal   Collection Time    11/03/13 12:35 PM      Result Value Range   Glucose-Capillary 265 (*) 70 - 99 mg/dL  GLUCOSE, CAPILLARY     Status: Abnormal   Collection Time    11/03/13  6:05 PM      Result Value Range   Glucose-Capillary 120 (*) 70 - 99 mg/dL  GLUCOSE, CAPILLARY     Status: Abnormal   Collection Time    11/03/13  9:35 PM      Result Value Range   Glucose-Capillary 199 (*) 70 - 99 mg/dL  RENAL FUNCTION PANEL     Status: Abnormal   Collection Time    11/04/13  4:16 AM      Result Value Range   Sodium 138  135 - 145 mEq/L   Potassium 3.6  3.5 - 5.1 mEq/L   Chloride 101  96 - 112 mEq/L   CO2 29  19 - 32 mEq/L   Glucose, Bld 143 (*) 70 - 99 mg/dL   BUN 15  6 - 23 mg/dL   Comment: DELTA CHECK NOTED   Creatinine, Ser 1.50 (*) 0.50 - 1.10 mg/dL   Calcium 8.1 (*) 8.4 - 10.5 mg/dL   Phosphorus 2.1 (*) 2.3 - 4.6 mg/dL   Albumin 2.3 (*) 3.5 - 5.2 g/dL   GFR calc non Af Amer 33 (*) >90 mL/min   GFR calc Af Amer 38 (*) >90 mL/min   Comment: (NOTE)     The eGFR has been calculated using the CKD EPI equation.     This calculation has not been validated in all clinical situations.      eGFR's persistently <90 mL/min signify possible Chronic Kidney     Disease.  CBC     Status: Abnormal   Collection Time    11/04/13  4:16 AM      Result Value Range   WBC 6.9  4.0 - 10.5 K/uL   RBC 3.62 (*) 3.87 - 5.11 MIL/uL   Hemoglobin 11.5 (*) 12.0 - 15.0 g/dL   HCT 04.5 (*) 40.9 - 81.1 %   MCV 95.9  78.0 - 100.0  fL   MCH 31.8  26.0 - 34.0 pg   MCHC 33.1  30.0 - 36.0 g/dL   RDW 04.5  40.9 - 81.1 %   Platelets 154  150 - 400 K/uL  GLUCOSE, CAPILLARY     Status: Abnormal   Collection Time    11/04/13  7:56 AM      Result Value Range   Glucose-Capillary 41 (*) 70 - 99 mg/dL  GLUCOSE, CAPILLARY     Status: Abnormal   Collection Time    11/04/13  8:21 AM      Result Value Range   Glucose-Capillary 58 (*) 70 - 99 mg/dL  GLUCOSE, CAPILLARY     Status: Abnormal   Collection Time    11/04/13  9:16 AM      Result Value Range   Glucose-Capillary 108 (*) 70 - 99 mg/dL   No results found.  Assessment/Plan CKD progressed to ESRD Improving overall on HD. Discussed need for tunneled HD catheter as HD expected to be longer term. Explained procedure, risks, complications, use of sedation. Labs reviewed. Will hold Lovenox day of procedure. Plan for 12/1. Consent signed in chart  Brayton El PA-C 11/04/2013, 9:48 AM

## 2013-11-04 NOTE — Progress Notes (Signed)
Patient ID: Nicole [name], female   DOB: 1935-10-07, 78 y.o.   MRN: [id number]   Patient Name: Nicole [name] Date of Encounter: 11/04/2013  SUBJECTIVE  RHC 10/31/13 RA = 16  RV = 57/13/17  PA = 49/21 (35)  PCW = 35 (v=45)  Fick cardiac output/index = 6.6/4.3  FA sat = 91%  PA sat = 72%, 73%  She was started on renal dose dopamine after cath with high dose Lasix and metolazone to try and facilitate diuresis. She had minimal urine output and a dialysis catheter was placed.  Wednesday night, she had hemodialysis with good fluid removal, HD again yesterday.  CVP remains 13 this morning.  Plan for HD today.  Breathing better.    CURRENT MEDS . amLODipine  10 mg Oral Daily  . aspirin EC  81 mg Oral Daily  . atorvastatin  20 mg Oral q1800  . carvedilol  12.5 mg Oral BID WC  . cholecalciferol  2,000 Units Oral Daily  . dextrose      . enoxaparin (LOVENOX) injection  30 mg Subcutaneous Q24H  . hydrALAZINE  100 mg Oral TID  . insulin aspart  0-9 Units Subcutaneous TID WC  . insulin glargine  15 Units Subcutaneous QHS  . isosorbide mononitrate  20 mg Oral BID  . levothyroxine  75 mcg Oral QAC breakfast  . pantoprazole  40 mg Oral Daily  . sodium chloride  3 mL Intravenous Q12H       OBJECTIVE  Filed Vitals:   11/04/13 0400 11/04/13 0500 11/04/13 0635 11/04/13 0818  BP: 155/66  186/45 175/35  Pulse: 77  81 62  Temp: 98.9 F (37.2 C)   97.5 F (36.4 C)  TempSrc: Oral   Oral  Resp: 15   13  Height:      Weight:  131 lb 6 oz (59.591 kg)    SpO2: 96%   95%    Intake/Output Summary (Last 24 hours) at 11/04/13 0941 Last data filed at 11/04/13 0600  Gross per 24 hour  Intake    140 ml  Output   2080 ml  Net  -1940 ml   Filed Weights   11/03/13 1350 11/03/13 1653 11/04/13 0500  Weight: 141 lb 1.5 oz (64 kg) 135 lb 9.3 oz (61.5 kg) 131 lb 6 oz (59.591 kg)    PHYSICAL EXAM  General: Pleasant, NAD. Lying in bed Neuro: Alert and oriented X 3. Moves all extremities  spontaneously. Psych: Normal affect. HEENT:  Normal  Neck: Supple without bruits, JVD to jaw Lungs:  Resp regular and unlabored, crackles up to the mid lungs B/L Heart: RRR no s3, s4, or murmurs. Abdomen: Soft, non-tender, distended, BS + x 4.  Extremities: No clubbing, cyanosis, trace ankle edema bilaterally.  CBC  Recent Labs  11/03/13 0500 11/04/13 0416  WBC 7.4 6.9  HGB 11.9* 11.5*  HCT 35.3* 34.7*  MCV 94.1 95.9  PLT 191 154   Basic Metabolic Panel  Recent Labs  11/03/13 0500 11/04/13 0416  NA 134* 138  K 3.5 3.6  CL 97 101  CO2 26 29  GLUCOSE 115* 143*  BUN 29* 15  CREATININE 1.80* 1.50*  CALCIUM 8.4 8.1*  PHOS 3.3 2.1*   Cardiac Enzymes No results found for this basename: CKTOTAL, CKMB, CKMBINDEX, TROPONINI,  in the last 72 hours BNP (last 3 results)  Recent Labs  02/13/13 1145 02/14/13 0540 10/29/13 1719  PROBNP 2031.0* 1703.0* 5412.0*   TTE 08/13/2013 Study Conclusions  -  Left ventricle: The cavity size was normal. Systolic function was vigorous. The estimated ejection fraction was in the range of 65% to 70%. Wall motion was normal; there were no regional wall motion abnormalities. Features are consistent with a pseudonormal left ventricular filling pattern, with concomitant abnormal relaxation and increased filling pressure (grade 2 diastolic dysfunction). - Mitral valve: Calcified annulus. Mild regurgitation. - Left atrium: The atrium was mildly dilated. - Right atrium: The atrium was mildly dilated. Impressions: - Compared to the prior study, there has been no significant interval change.   Left heart cath 05/08/2011   CONCLUSION:   1. Well-known preserved left ventricular systolic function.  2. Calcified disease of the proximal circumflex coronary artery which  is a dominant vessel of uncertain severity with normal flow  characteristics in a patient with stable symptoms.   DISPOSITION: Based upon the findings of the flow wire, a  continued  medical therapy would be recommended. The patient was at moderately  high risk. She is an insulin-dependent diabetic that would likely  require drug-eluting platform. She has a baseline hemoglobin of about  10 today, and she has had a history of proliferative retinopathy as well  as some rectal bleeding on antiplatelet therapy. As she is stable  without current symptoms, a continued approach will be recommended.  Arturo Morton. Riley Kill, MD, [facility]    Radiology/Studies  Dg Chest 2 View  10/29/2013   CLINICAL DATA:  Chest pain.  Shortness of breath.  Fluid overload.  EXAM: CHEST  2 VIEW  COMPARISON:  08/12/2013  FINDINGS: Mild cardiomegaly is stable. Increased diffuse interstitial infiltrates are seen, consistent with interstitial edema. Small bilateral pleural effusions of also increased in size since previous study.  IMPRESSION: Worsening congestive heart failure with increased small bilateral pleural effusions.  TELE:  Sinus bradycardia , 55-60 BPM  ECG SR, normal ECG    ASSESSMENT AND PLAN  78 yo woman with PMH of CKD stage IV, T2DM on insulin, PAD, hypertension, known CAD, acute on chronic HFpEF here with pulmonary edema.  1. Acute on chronic heart failure with preserved ejection fraction: Admitted for weight gain and DOE, found to be in pulmonary edema, she was hospitalized in September and followed every 2 weeks in the clinic since then. She has never reached comfortable state, always SOB, currently at rest NYHA IV.  She was dialyzed Wednesday, received IV Lasix boluses Thursday with about 900 cc UOP.   HD again on Friday.  - JVP/CVP remains elevated, needs further fluid removal.  Plan for HD again today.  Suspect she will need long-term HD.  Plan for tunneled catheter on Monday.   2. Resistant hypertension: BP still high.  Increase hydralazine to 100 mg tid.   3. ESRD: HD initiated for volume management.  Suspect that this may be permanent.   4. CAD: continue medical  management.   5. Peripheral arterial disease: B/L, stable currently.  Can go to telemetry today.   Signed, Marca Ancona  11/04/2013

## 2013-11-04 NOTE — Procedures (Signed)
Patient seen on Hemodialysis. QB 400, UF goal 3.5L Treatment adjusted as needed.  Cartel Mauss MD [facility]. Office # [phone] Pager # [phone] 5:11 PM  

## 2013-11-05 LAB — GLUCOSE, CAPILLARY
Glucose-Capillary: 165 mg/dL — ABNORMAL HIGH (ref 70–99)
Glucose-Capillary: 257 mg/dL — ABNORMAL HIGH (ref 70–99)
Glucose-Capillary: 297 mg/dL — ABNORMAL HIGH (ref 70–99)
Glucose-Capillary: 131 mg/dL — ABNORMAL HIGH (ref 70–99)
Glucose-Capillary: 177 mg/dL — ABNORMAL HIGH (ref 70–99)

## 2013-11-05 LAB — CBC
HCT: 32.3 % — ABNORMAL LOW (ref 36.0–46.0)
Hemoglobin: 10.7 g/dL — ABNORMAL LOW (ref 12.0–15.0)
MCH: 31.2 pg (ref 26.0–34.0)
MCHC: 33.1 g/dL (ref 30.0–36.0)
Platelets: 138 10*3/uL — ABNORMAL LOW (ref 150–400)
RBC: 3.43 MIL/uL — ABNORMAL LOW (ref 3.87–5.11)

## 2013-11-05 LAB — RENAL FUNCTION PANEL
BUN: 11 mg/dL (ref 6–23)
CO2: 28 mEq/L (ref 19–32)
Calcium: 8 mg/dL — ABNORMAL LOW (ref 8.4–10.5)
Chloride: 101 mEq/L (ref 96–112)
Creatinine, Ser: 1.52 mg/dL — ABNORMAL HIGH (ref 0.50–1.10)
GFR calc non Af Amer: 32 mL/min — ABNORMAL LOW (ref >90)
Glucose, Bld: 156 mg/dL — ABNORMAL HIGH (ref 70–99)

## 2013-11-05 MED ORDER — ENOXAPARIN SODIUM 30 MG/0.3ML ~~LOC~~ SOLN
30.0000 mg | SUBCUTANEOUS | Status: DC
  Filled 2013-11-05: qty 0.3

## 2013-11-05 MED ORDER — SODIUM CHLORIDE 0.9 % IJ SOLN
10.0000 mL | INTRAMUSCULAR | Status: DC | PRN
  Administered 2013-11-05 – 2013-11-06 (×3): 10 mL

## 2013-11-05 MED ORDER — CEFAZOLIN SODIUM-DEXTROSE 2-3 GM-% IV SOLR
2.0000 g | INTRAVENOUS | Status: DC
  Filled 2013-11-05: qty 50

## 2013-11-05 MED ORDER — POTASSIUM CHLORIDE CRYS ER 20 MEQ PO TBCR
40.0000 meq | EXTENDED_RELEASE_TABLET | Freq: Once | ORAL | Status: AC
  Administered 2013-11-05: 12:00:00 40 meq via ORAL
  Filled 2013-11-05: qty 2

## 2013-11-05 NOTE — Plan of Care (Signed)
Problem: Phase I Progression Outcomes Goal: EF % per last Echo/documented,Core Reminder form on chart Outcome: Completed/Met Date Met:  11/05/13 EF 65-70%(08-13-13)

## 2013-11-05 NOTE — Progress Notes (Signed)
Patient ID: KMYA PLACIDE, female   DOB: Apr 10, 1935, 78 y.o.   MRN: [id number]  Patient Name: Nicole [name] Date of Encounter: 11/05/2013  SUBJECTIVE  Less dyspnic Neck hurts from catheter.  Admitted with recurrent diastolic CHF resistant to diuretics needing dialysis.  Thinks RUE is swollen  Dialysis catheter has been in R IJ since Tuesday    CURRENT MEDS . amLODipine  10 mg Oral Daily  . aspirin EC  81 mg Oral Daily  . atorvastatin  20 mg Oral q1800  . carvedilol  12.5 mg Oral BID WC  . [START ON 11/06/2013]  ceFAZolin (ANCEF) IV  2 g Intravenous On Call  . cholecalciferol  2,000 Units Oral Daily  . enoxaparin (LOVENOX) injection  30 mg Subcutaneous Q24H  . hydrALAZINE  100 mg Oral TID  . insulin aspart  0-9 Units Subcutaneous TID WC  . insulin glargine  12 Units Subcutaneous QHS  . isosorbide mononitrate  20 mg Oral BID  . levothyroxine  75 mcg Oral QAC breakfast  . pantoprazole  40 mg Oral Daily  . sodium chloride  3 mL Intravenous Q12H       OBJECTIVE  Filed Vitals:   11/04/13 1753 11/04/13 2035 11/05/13 0002 11/05/13 0502  BP: 172/45 130/38 144/37 162/41  Pulse: 76 71 76 77  Temp: 98.6 F (37 C) 98.8 F (37.1 C)  98.7 F (37.1 C)  TempSrc: Oral Oral  Oral  Resp: 19 18 16 18   Height:      Weight: 127 lb 6.4 oz (57.788 kg)   127 lb (57.607 kg)  SpO2: 96% 95% 98% 99%    Intake/Output Summary (Last 24 hours) at 11/05/13 0902 Last data filed at 11/04/13 1710  Gross per 24 hour  Intake    120 ml  Output   3020 ml  Net  -2900 ml   Filed Weights   11/04/13 1710 11/04/13 1753 11/05/13 0502  Weight: 126 lb 15.8 oz (57.6 kg) 127 lb 6.4 oz (57.788 kg) 127 lb (57.607 kg)    PHYSICAL EXAM  General: Pleasant, NAD. Lying in bed Neuro: Alert and oriented X 3. Moves all extremities spontaneously. Psych: Normal affect. HEENT:  Normal  Neck: Supple without bruits, JVD to jaw Lungs:  Resp regular and unlabored, crackles up to the mid lungs B/L Heart: RRR no  s3, s4, SEM  murmurs. Abdomen: Soft, non-tender, distended, BS + x 4.  Extremities: No clubbing, cyanosis, bilateral LE 2+ to thighs, 2-3+ bilateral UE.; very tight tender skin,  Radials 2+, DP/DT non-pulpable B/L. Right IJ with right arm not tender but more swollen than left especially in forearm   Accessory Clinical Findings  CBC  Recent Labs  11/04/13 0416 11/05/13 0715  WBC 6.9 5.5  HGB 11.5* 10.7*  HCT 34.7* 32.3*  MCV 95.9 94.2  PLT 154 138*   Basic Metabolic Panel  Recent Labs  11/04/13 0416 11/04/13 0900 11/05/13 0718  NA 138 139 137  K 3.6 3.4* 3.2*  CL 101 104 101  CO2 29 25 28   GLUCOSE 143* 115* 156*  BUN 15 14 11   CREATININE 1.50* 1.47* 1.52*  CALCIUM 8.1* 7.2* 8.0*  PHOS 2.1*  --  2.0*   BNP (last 3 results)  Recent Labs  02/13/13 1145 02/14/13 0540 10/29/13 1719  PROBNP 2031.0* 1703.0* 5412.0*   TTE 08/13/2013 Study Conclusions  - Left ventricle: The cavity size was normal. Systolic function was vigorous. The estimated ejection fraction was in the range of 65%  to 70%. Wall motion was normal; there were no regional wall motion abnormalities. Features are consistent with a pseudonormal left ventricular filling pattern, with concomitant abnormal relaxation and increased filling pressure (grade 2 diastolic dysfunction). - Mitral valve: Calcified annulus. Mild regurgitation. - Left atrium: The atrium was mildly dilated. - Right atrium: The atrium was mildly dilated. Impressions: - Compared to the prior study, there has been no significant interval change.   Left heart cath 05/08/2011   CONCLUSION:   1. Well-known preserved left ventricular systolic function.  2. Calcified disease of the proximal circumflex coronary artery which  is a dominant vessel of uncertain severity with normal flow  characteristics in a patient with stable symptoms.   DISPOSITION: Based upon the findings of the flow wire, a continued  medical therapy would be  recommended. The patient was at moderately  high risk. She is an insulin-dependent diabetic that would likely  require drug-eluting platform. She has a baseline hemoglobin of about  10 today, and she has had a history of proliferative retinopathy as well  as some rectal bleeding on antiplatelet therapy. As she is stable  without current symptoms, a continued approach will be recommended.  Arturo Morton. Riley Kill, MD, [facility]    Radiology/Studies  Dg Chest 2 View  10/29/2013   CLINICAL DATA:  Chest pain.  Shortness of breath.  Fluid overload.  EXAM: CHEST  2 VIEW  COMPARISON:  08/12/2013  FINDINGS: Mild cardiomegaly is stable. Increased diffuse interstitial infiltrates are seen, consistent with interstitial edema. Small bilateral pleural effusions of also increased in size since previous study.  IMPRESSION: Worsening congestive heart failure with increased small bilateral pleural effusions.  TELE:  Sinus bradycardia , 55-60 BPM 11/05/2013   ECG SR, normal ECG    ASSESSMENT AND PLAN  78 yo woman with PMH of CKD stage IV, T2DM on insulin, PAD, hypertension, known CAD, acute on chronic HFpEF here with pulmonary edema.  1. Acute on chronic heart failure with preserved ejection fraction: Admitted for weight gain and DOE, found to be in pulmonary edema, she was hospitalized in September and followed every 2 weeks in the clinic since then. She has never reached comfortable state, always SOB, currently at rest NYHA IV.  Dialyzed yesterday.  Plans for tunneled catheter early next week.  Will order Upper extremity duplex to r/o DVT in subclavian or axillary veins  2. Resistant hypertension:  Continue current meds.  - No ACE-I with CRF.  If no improvement with dialysis consider clonidine  3. ESRD: HD initiated for volume management.  Tunneled catheter ? Monday will likely need permanent dialysis to stay out of hospital  4. CAD: continue medical management.   5. Peripheral arterial disease: B/L,  stable currently.  Vira Blanco  11/05/2013

## 2013-11-05 NOTE — Progress Notes (Signed)
Patient ID: Nicole [name], female   DOB: 1935/04/04, 78 y.o.   MRN: [id number]  [facility] Progress Note    Assessment/ Plan:   1. Acute renal failure on chronic kidney disease stage IV: Appears to be secondary to acute cardiorenal syndrome (acute exacerbation of diastolic heart failure). Will request for interventional radiology to convert her from a temporary dialysis catheter to tunneled one. We'll consult vascular surgery for placement of a permanent dialysis access. I have started the process for outpatient dialysis unit placement (possibly to [facility] per patient request). Next hemodialysis on Tuesday. 2. Acute exacerbation of diastolic heart failure: Refractory to medical management and started on dialysis. Not optimistic regarding renal recovery (baseline CKD4).  3. Anemia of chronic kidney disease: Hemoglobin currently at goal-we'll start ESA with her declining hemoglobin. Recheck iron stores. 4. Hypertension: Will adjust antihypertensive agents and anticipate will improve further with ultrafiltration on hemodialysis  Subjective:   Reports to be feeling better-noted some right upper extremity swelling. Reports improvement in her shortness of breath.    Objective:   BP 162/41  Pulse 77  Temp(Src) 98.7 F (37.1 C) (Oral)  Resp 18  Ht 5\' 4"  (1.626 m)  Wt 57.607 kg (127 lb)  BMI 21.79 kg/m2  SpO2 99%  Intake/Output Summary (Last 24 hours) at 11/05/13 1030 Last data filed at 11/05/13 1027  Gross per 24 hour  Intake    340 ml  Output   3020 ml  Net  -2680 ml   Weight change: -3.4 kg (-7 lb 7.9 oz)  Physical Exam: Gen: Comfortably resting in bed-sleeping on her right side CVS: Pulse regular in rate and rhythm, S1 and S2 normal Resp: Fine rales right base otherwise clear Abd: Soft, obese, nontender and bowel sounds are normal Ext: One plus thigh edema bilaterally-no leg edema  Imaging: No results found.  Labs: BMET  Recent  Labs Lab 10/31/13 0620 10/31/13 2030 11/01/13 0430 11/02/13 0800 11/03/13 0500 11/04/13 0416 11/04/13 0900 11/05/13 0718  NA 139  --  135 133* 134* 138 139 137  K 3.2*  --  4.0 3.8 3.5 3.6 3.4* 3.2*  CL 101  --  99 95* 97 101 104 101  CO2 23  --  22 26 26 29 25 28   GLUCOSE 49*  --  275* 255* 115* 143* 115* 156*  BUN 58*  --  58* 24* 29* 15 14 11   CREATININE 2.37* 2.10* 2.33* 1.53* 1.80* 1.50* 1.47* 1.52*  CALCIUM 8.9  --  8.7 8.4 8.4 8.1* 7.2* 8.0*  PHOS  --   --   --   --  3.3 2.1*  --  2.0*   CBC  Recent Labs Lab 10/31/13 2030 11/03/13 0500 11/04/13 0416 11/05/13 0715  WBC 7.1 7.4 6.9 5.5  HGB 12.6 11.9* 11.5* 10.7*  HCT 37.5 35.3* 34.7* 32.3*  MCV 94.2 94.1 95.9 94.2  PLT 245 191 154 138*    Medications:    . amLODipine  10 mg Oral Daily  . aspirin EC  81 mg Oral Daily  . atorvastatin  20 mg Oral q1800  . carvedilol  12.5 mg Oral BID WC  . [START ON 11/06/2013]  ceFAZolin (ANCEF) IV  2 g Intravenous On Call  . cholecalciferol  2,000 Units Oral Daily  . [START ON 11/06/2013] enoxaparin (LOVENOX) injection  30 mg Subcutaneous Q24H  . hydrALAZINE  100 mg Oral TID  . insulin aspart  0-9 Units Subcutaneous TID WC  .  insulin glargine  12 Units Subcutaneous QHS  . isosorbide mononitrate  20 mg Oral BID  . levothyroxine  75 mcg Oral QAC breakfast  . pantoprazole  40 mg Oral Daily  . potassium chloride  40 mEq Oral Once  . sodium chloride  3 mL Intravenous Q12H   Zetta Bills, MD 11/05/2013, 10:30 AM

## 2013-11-06 ENCOUNTER — Ambulatory Visit: Payer: Medicare Other | Admitting: Cardiology

## 2013-11-06 DIAGNOSIS — M79609 Pain in unspecified limb: Secondary | ICD-10-CM

## 2013-11-06 DIAGNOSIS — Z01810 Encounter for preprocedural cardiovascular examination: Secondary | ICD-10-CM

## 2013-11-06 DIAGNOSIS — N186 End stage renal disease: Secondary | ICD-10-CM

## 2013-11-06 LAB — RENAL FUNCTION PANEL
BUN: 19 mg/dL (ref 6–23)
CO2: 28 mEq/L (ref 19–32)
Calcium: 8.3 mg/dL — ABNORMAL LOW (ref 8.4–10.5)
Chloride: 100 mEq/L (ref 96–112)
GFR calc Af Amer: 25 mL/min — ABNORMAL LOW (ref >90)
GFR calc non Af Amer: 22 mL/min — ABNORMAL LOW (ref >90)
Potassium: 3.9 mEq/L (ref 3.5–5.1)
Sodium: 137 mEq/L (ref 135–145)

## 2013-11-06 LAB — GLUCOSE, CAPILLARY
Glucose-Capillary: 238 mg/dL — ABNORMAL HIGH (ref 70–99)
Glucose-Capillary: 187 mg/dL — ABNORMAL HIGH (ref 70–99)
Glucose-Capillary: 73 mg/dL (ref 70–99)
Glucose-Capillary: 122 mg/dL — ABNORMAL HIGH (ref 70–99)
Glucose-Capillary: 272 mg/dL — ABNORMAL HIGH (ref 70–99)
Glucose-Capillary: 234 mg/dL — ABNORMAL HIGH (ref 70–99)

## 2013-11-06 MED ORDER — DEXTROSE 50 % IV SOLN: 25.0000 mL | Freq: Once | INTRAVENOUS | Status: AC | PRN

## 2013-11-06 MED ORDER — CLONIDINE HCL 0.1 MG PO TABS
0.1000 mg | ORAL_TABLET | Freq: Two times a day (BID) | ORAL | Status: DC
  Administered 2013-11-06 – 2013-11-09 (×8): 0.1 mg via ORAL
  Filled 2013-11-06 (×11): qty 1

## 2013-11-06 MED ORDER — DARBEPOETIN ALFA-POLYSORBATE 25 MCG/0.42ML IJ SOLN
25.0000 ug | INTRAMUSCULAR | Status: DC
  Administered 2013-11-07: 25 ug via INTRAVENOUS
  Filled 2013-11-06: qty 0.42

## 2013-11-06 MED ORDER — ENOXAPARIN SODIUM 30 MG/0.3ML ~~LOC~~ SOLN
30.0000 mg | SUBCUTANEOUS | Status: DC
  Administered 2013-11-06 – 2013-11-09 (×4): 30 mg via SUBCUTANEOUS
  Filled 2013-11-06 (×5): qty 0.3

## 2013-11-06 MED ORDER — DEXTROSE 50 % IV SOLN
INTRAVENOUS | Status: AC
  Administered 2013-11-06: 13:00:00 50 mL
  Filled 2013-11-06: qty 50

## 2013-11-06 MED ORDER — CEFAZOLIN SODIUM-DEXTROSE 2-3 GM-% IV SOLR
2.0000 g | INTRAVENOUS | Status: AC
  Administered 2013-11-07: 2 g via INTRAVENOUS
  Filled 2013-11-06 (×2): qty 50

## 2013-11-06 NOTE — Progress Notes (Signed)
VASCULAR LAB PRELIMINARY  PRELIMINARY  PRELIMINARY  PRELIMINARY  Right upper extremity venous Doppler completed.    Preliminary report:  There is no DVT or SVT noted in the right upper extremity.  Nicole [name], RVT 11/06/2013, 10:31 AM

## 2013-11-06 NOTE — Progress Notes (Signed)
Physical Therapy Treatment Patient Details Name: Nicole [name] MRN: [id number] DOB: 1934-12-31 Today's Date: 11/06/2013 Time: 4098-1191 PT Time Calculation (min): 23 min  PT Assessment / Plan / Recommendation  History of Present Illness Pt admit with CHF.    PT Comments   Pt with improved mobility, able to perform at supervision-min guard level.  Pt able to gait on room air with spO2 96%.  Will continue to benefit from PT services to improve strength and activity tolerance.  Follow Up Recommendations  SNF;Supervision/Assistance - 24 hour     Does the patient have the potential to tolerate intense rehabilitation     Barriers to Discharge        Equipment Recommendations  None recommended by PT    Recommendations for Other Services    Frequency Min 3X/week   Progress towards PT Goals Progress towards PT goals: Progressing toward goals  Plan Current plan remains appropriate    Precautions / Restrictions Precautions Precautions: Fall Restrictions Weight Bearing Restrictions: No   Pertinent Vitals/Pain Pt c/o hip pain with gait, eased with rest.  spO2 96% on room air during gait    Mobility  Bed Mobility Bed Mobility: Supine to Sit Supine to Sit: 5: Supervision Details for Bed Mobility Assistance: increased time but able to perform without physical assistance. Transfers Sit to Stand: 4: Min guard Stand to Sit: 4: Min guard Details for Transfer Assistance: steadying assist, cues for UE placement Ambulation/Gait Ambulation/Gait Assistance: 5: Supervision Ambulation Distance (Feet): 180 Feet Assistive device: Rolling walker Ambulation/Gait Assistance Details: pt with improved activity tolerance, cues for negotiating tight spaces with RW    Exercises General Exercises - Lower Extremity Ankle Circles/Pumps: AROM;Both;10 reps Long Arc Quad: AROM;10 reps;Both Hip Flexion/Marching: AROM;10 reps;Both   PT Diagnosis:    PT Problem List:   PT Treatment Interventions:      PT Goals (current goals can now be found in the care plan section)    Visit Information  Last PT Received On: 11/06/13 Assistance Needed: +1 History of Present Illness: Pt admit with CHF.     Subjective Data      Cognition  Cognition Arousal/Alertness: Awake/alert Behavior During Therapy: WFL for tasks assessed/performed Overall Cognitive Status: Within Functional Limits for tasks assessed    Balance     End of Session PT - End of Session Equipment Utilized During Treatment: Gait belt Activity Tolerance: Patient tolerated treatment well Patient left: in chair;with nursing/sitter in room;with call bell/phone within reach Nurse Communication: Mobility status   GP     Nicole [name] 11/06/2013, 9:56 AM

## 2013-11-06 NOTE — Progress Notes (Signed)
Patient ID: Nicole [name], female   DOB: Aug 30, 1935, 78 y.o.   MRN: [id number]  [facility] Progress Note    Assessment/ Plan:   1. Acute renal failure on chronic kidney disease stage IV:  NEW ESRD Appears to be secondary to acute cardiorenal syndrome (acute exacerbation of diastolic heart failure).  IR to exchange temp cath for tunnelled HD cath this afternoon and [facility] has been consulted for permanent access CLIP process for outpatient dialysis unit placement (possibly to [facility] per patient request).  Next hemodialysis on Tuesday. 2. Acute exacerbation of diastolic heart failure:  Refractory to medical management and started on dialysis.  Not optimistic regarding renal recovery (baseline CKD4) and planning for long term dialysis 3. Anemia of chronic kidney disease:  Hemoglobin currently over 10; check iron studies; low dose ESA  4. Hypertension: Will adjust antihypertensive agents and anticipate will improve further with ultrafiltration on hemodialysis 5. CKD-MBD - PTH was 158 in September; recheck and start hectorol with HD if over 300  Subjective:   Reports to be feeling better-noted some right upper extremity swelling. Duplex was negative for clot.  Does have IJ cath on that side - would ?central v stenosis. Reports continued improvement in her shortness of breath.    Objective:   BP 168/41  Pulse 68  Temp(Src) 98.4 F (36.9 C) (Oral)  Resp 16  Ht 5\' 4"  (1.626 m)  Wt 59.104 kg (130 lb 4.8 oz)  BMI 22.36 kg/m2  SpO2 94%  Intake/Output Summary (Last 24 hours) at 11/06/13 1321 Last data filed at 11/06/13 0911  Gross per 24 hour  Intake    665 ml  Output    300 ml  Net    365 ml   Weight change: -1.496 kg (-3 lb 4.8 oz)  Physical Exam: Gen: Sitting up in the chair Anxious to go for her procedure CVS: Pulse regular in rate and rhythm, S1 and S2 normal Resp: Fine rales right base otherwise clear Abd: Soft, obese, nontender  and bowel sounds are normal Ext: One plus thigh edema bilaterally-woody changes pretib region Right arm edema Right temp HD cath in place   BMET  Recent Labs Lab 11/01/13 0430 11/02/13 0800 11/03/13 0500 11/04/13 0416 11/04/13 0900 11/05/13 0718 11/06/13 0500  NA 135 133* 134* 138 139 137 137  K 4.0 3.8 3.5 3.6 3.4* 3.2* 3.9  CL 99 95* 97 101 104 101 100  CO2 22 26 26 29 25 28 28   GLUCOSE 275* 255* 115* 143* 115* 156* 184*  BUN 58* 24* 29* 15 14 11 19   CREATININE 2.33* 1.53* 1.80* 1.50* 1.47* 1.52* 2.08*  CALCIUM 8.7 8.4 8.4 8.1* 7.2* 8.0* 8.3*  PHOS  --   --  3.3 2.1*  --  2.0* 2.4    Recent Labs Lab 10/31/13 2030 11/03/13 0500 11/04/13 0416 11/05/13 0715  WBC 7.1 7.4 6.9 5.5  HGB 12.6 11.9* 11.5* 10.7*  HCT 37.5 35.3* 34.7* 32.3*  MCV 94.2 94.1 95.9 94.2  PLT 245 191 154 138*    Medications:    . amLODipine  10 mg Oral Daily  . aspirin EC  81 mg Oral Daily  . atorvastatin  20 mg Oral q1800  . carvedilol  12.5 mg Oral BID WC  .  ceFAZolin (ANCEF) IV  2 g Intravenous On Call  . cholecalciferol  2,000 Units Oral Daily  . cloNIDine  0.1 mg Oral BID  . enoxaparin (LOVENOX) injection  30 mg Subcutaneous  Q24H  . hydrALAZINE  100 mg Oral TID  . insulin aspart  0-9 Units Subcutaneous TID WC  . insulin glargine  12 Units Subcutaneous QHS  . isosorbide mononitrate  20 mg Oral BID  . levothyroxine  75 mcg Oral QAC breakfast  . pantoprazole  40 mg Oral Daily  . sodium chloride  3 mL Intravenous Q12H   Camille Bal, MD [facility]  11/06/2013, 1:21 PM

## 2013-11-06 NOTE — Progress Notes (Signed)
VASCULAR LAB PRELIMINARY  PRELIMINARY  PRELIMINARY  PRELIMINARY  Right  Upper Extremity Vein Map    Cephalic  Segment Diameter Depth Comment  1. Axilla 2.33mm mm   2. Mid upper arm 1.21mm mm   3. Above AC 1.67mm mm   4. In AC 2.26mm mm   5. Below AC 3.23mm mm branch  6. Mid forearm 2.46mm mm   7. Wrist 1.7mm mm    mm mm    mm mm    mm mm    Basilic  Segment Diameter Depth Comment  1. Axilla mm mm   2. Mid upper arm 4.87mm mm   3. Above [facility] 3.16mm 19.78mm   4. In [facility] 3.59mm 15.44mm branch  5. Below AC 2.7mm 9.24mm Multiple branches  6. Mid forearm mm mm   7. Wrist mm mm    mm mm    mm mm    mm mm      Left Upper Extremity Vein Map    Cephalic  Segment Diameter Depth Comment  1. Axilla 2.55mm mm   2. Mid upper arm 1.78mm mm   3. Above AC 1.42mm mm   4. In [facility] 1.42mm mm   5. Below AC mm mm   6. Mid forearm mm mm   7. Wrist mm mm    mm mm    mm mm    mm mm    Basilic  Segment Diameter Depth Comment  1. Axilla mm mm   2. Mid upper arm 4.31mm 16mm   3. Above [facility] 4.69mm 15.25mm   4. In [facility] 3.29mm 10.37mm branch  5. Below AC 1.54mm mm   6. Mid forearm 1.56mm mm   7. Wrist 1.24mm mm   ################      Basilic branch at [facility] 2.35mm  Origin at medial AC, extending laterally across forearm  Basilic branch at below [facility] 3.39mm    Basilic branch at mid forearm 2.66mm    Basilic branch at wrist 1.28mm      Farrel Demark, RDMS, RVT  11/06/2013, 3:00 PM

## 2013-11-06 NOTE — Consult Note (Signed)
[facility] Consult  Reason for Consult:  ESRD Referring Physician:  Allena Katz  [id number]  History of Present Illness: This is a 78 y.o. female who was admitted a little over a week ago with increasing SOB and swelling.  She does have a hx of chronic diastolic heart failure, CAD, hx of PAD, as well as CKD stage IV.  She was placed a diuretics as well as a dopamine gtt for possible benefit for kidney perfusion.  She did undergo a right heart cath 10/31/13, which revealed She has had very sluggish diuresis despite markedly elevated filling pressures, normal cardiac output and high-dose diuretics. I suspect the major issue is near end-stage renal disease. Will start renal-dose dopamine to try and facilitate diuresis.  She was found to have acute on chronic renal failure and was placed on hemodialysis via a right temporary IJ catheter.  It was unclear if this would be temporary or if she would need permanent HD.    It is suspected that she will need long term HD and IR placed a diatek catheter on 11/04/13.  VVS is consulted for permanent HD access.  She does have a remote tobacco history quitting cigarettes about 38 years ago.  She had a venous doppler of her RUE, which revealed no DVT or SVT are noted as she does have a swollen right arm.  Pt states that it is from the temporary catheter in the right IJ.   Past Medical History  Diagnosis Date  . Retinopathy   . Hypertension   . Chronic diastolic CHF (congestive heart failure)     has a normal EF per echo 03/2011 & 5/2013with diastolic dysfunction  . CAD (coronary artery disease)     Prior PCI in 1998 x 2; s/p cath in 2001, negative Myoview in 2008 & 2011, s/p cath in June 2012 with calcified disease of the proximal LCX with normal flow reserve. She has been managed medically due to her other morbidities  . Cerebrovascular disease   . Peripheral vascular disease     prior angiography showing RSFA stenosis, left anterior  tibial and left posterior tibial stenoses  . Hypercholesterolemia   . Diabetes mellitus     insulin dependent  . Hypothyroidism   . Hiatal hernia   . Degenerative joint disease   . Osteoporosis   . Vitamin D deficiency   . Anemia of chronic disease   . Renal insufficiency   . Shortness of breath   . Anginal pain    Past Surgical History  Procedure Laterality Date  . Abdominal aortogram  07/27/2003    by Dr. Chales Abrahams  . Bilateral lower extremity angiogram  07/27/2003    by Dr. Chales Abrahams  . Selective angiography,right superficial femoral artery, right and left iliac arteries  07/27/2003    by Dr. Chales Abrahams  . Measurement of gradient right and left iliac arteries  07/27/2003    by Dr. Chales Abrahams  . Bilateral cataract sugery  2000 and 2002    by Dr. Cecilie Kicks  . Repair left hip fracture  04/2008    by Dr. Charlann Boxer    Allergies  Allergen Reactions  . Ibuprofen     REACTION: edema---thyroid problems  . Prednisone     Causes severe fluid retention    Prior to Admission medications   Medication Sig Start Date End Date Taking? Authorizing Provider  acetaminophen (TYLENOL) 500 MG tablet Take 500 mg by mouth every 6 (six) hours as needed for pain.   Yes  Historical Provider, MD  amLODipine (NORVASC) 10 MG tablet Take 1 tablet (10 mg total) by mouth daily. 10/02/13  Yes Lars Masson, MD  aspirin EC 81 MG tablet Take 81 mg by mouth daily.   Yes Historical Provider, MD  atorvastatin (LIPITOR) 20 MG tablet Take 1 tablet (20 mg total) by mouth daily at 6 PM. 10/02/13  Yes Lars Masson, MD  carvedilol (COREG) 12.5 MG tablet Take 1 tablet (12.5 mg total) by mouth 2 (two) times daily with a meal. 10/02/13  Yes Lars Masson, MD  Cholecalciferol (VITAMIN D) 1000 UNITS capsule Take 2,000 Units by mouth daily.    Yes Historical Provider, MD  cloNIDine (CATAPRES) 0.1 MG tablet Take 0.1 mg by mouth daily at 10 pm.  09/14/13  Yes Lars Masson, MD  furosemide (LASIX) 40 MG tablet Take 40 mg twice a day.  10/18/13  Yes Lars Masson, MD  hydrALAZINE (APRESOLINE) 25 MG tablet Take 37.5 mg by mouth 3 (three) times daily.   Yes Historical Provider, MD  insulin glargine (LANTUS) 100 UNIT/ML injection Inject 0.2 mLs (20 Units total) into the skin at bedtime. 04/26/13  Yes Michele Mcalpine, MD  insulin lispro (HUMALOG) 100 UNIT/ML injection Inject 0-10 Units into the skin 3 (three) times daily before meals. Home sliding scale   Yes Historical Provider, MD  isosorbide mononitrate (ISMO,MONOKET) 20 MG tablet Take 1 tablet (20 mg total) by mouth 2 (two) times daily. 10/02/13  Yes Lars Masson, MD  levothyroxine (SYNTHROID) 75 MCG tablet Take 1 tablet (75 mcg total) by mouth daily. 01/26/13  Yes Michele Mcalpine, MD  pantoprazole (PROTONIX) 40 MG tablet Take 1 tablet (40 mg total) by mouth daily. 08/29/13  Yes Michele Mcalpine, MD  traMADol (ULTRAM) 50 MG tablet Take 1 tablet (50 mg total) by mouth every 6 (six) hours as needed for pain. 08/11/13  Yes Michele Mcalpine, MD  nitroGLYCERIN (NITROSTAT) 0.4 MG SL tablet Place 0.4 mg under the tongue every 5 (five) minutes as needed for chest pain.    Historical Provider, MD    Statin:  yes Beta Blocker:  yes Aspirin:  yes ACEI:  no ARB:  no Other antiplatelets/anticoagulants:  no  History   Social History  . Marital Status: Married    Spouse Name: Llana Aliment. Cregger    Number of Children: 2  . Years of Education: N/A   Occupational History  . retired    Social History Main Topics  . Smoking status: Former Smoker    Types: Cigarettes    Quit date: 12/07/1974  . Smokeless tobacco: Never Used     Comment: pt started smoking in 1956  . Alcohol Use: No  . Drug Use: No  . Sexual Activity: No   Other Topics Concern  . Not on file   Social History Narrative   Married to [employer]   2 children---son has kidney and pancrease transplant     Family History  Problem Relation Age of Onset  . Heart disease Mother   . Cancer Mother   . Cancer Father      ROS: [x]  Positive   [ ]  Negative   [ ]  All sytems reviewed and are negative  Cardiovascular: [x]  chest pain/pressure []  palpitations [x]  SOB  []  DOE []  pain in legs while walking []  pain in legs at rest []  pain in legs at night []  non-healing ulcers []  hx of DVT [x]  swelling in legs  Pulmonary: []  productive cough []  asthma/wheezing []  home O2  Neurologic: []  weakness in []  arms []  legs []  numbness in []  arms []  legs []  hx of CVA []  mini stroke [] difficulty speaking or slurred speech []  temporary loss of vision in one eye [x]  dizziness  Hematologic: []  hx of cancer []  bleeding problems []  problems with blood clotting easily  Endocrine:   [x]  diabetes [x]  thyroid disease  GI []  vomiting blood []  blood in stool [x]  heartburn [x]  hiatal hernia  GU: [x]  CKD/renal failure []  HD--[]  M/W/F or []  T/T/S []  burning with urination []  blood in urine  Psychiatric: []  anxiety []  depression  Musculoskeletal: [x]  arthritis []  joint pain  Integumentary: []  rashes []  ulcers  Constitutional: []  fever []  chills   Physical Examination  Filed Vitals:   11/06/13 0910  BP: 168/41  Pulse: 68  Temp:   Resp:    Body mass index is 22.36 kg/(m^2).  General:  WDWN in NAD Gait: Not observed HENT: WNL, normocephalic Eyes: Pupils equal Pulmonary: normal non-labored breathing, without Rales, rhonchi,  wheezing Cardiac: regular, without  Murmurs, rubs or gallops; Abdomen: soft, NT/ND, no masses Skin: without rashes, without ulcers  Vascular Exam/Pulses:bilateral radial/ulnar/brachial are difficult to palpate.  Sensation and motor are in tact.  Palpable DP left.  Right is not palpable. Extremities: without ischemic changes, without Gangrene , without cellulitis; without open wounds;  Musculoskeletal: no muscle wasting or atrophy  Neurologic: A&O X 3; Appropriate Affect ; SENSATION: normal; MOTOR FUNCTION:  moving all extremities equally. Speech is  fluent/normal  CBC    Component Value Date/Time   WBC 5.5 11/05/2013 0715   RBC 3.43* 11/05/2013 0715   RBC 3.48* 03/20/2011 1715   HGB 10.7* 11/05/2013 0715   HCT 32.3* 11/05/2013 0715   PLT 138* 11/05/2013 0715   MCV 94.2 11/05/2013 0715   MCH 31.2 11/05/2013 0715   MCHC 33.1 11/05/2013 0715   RDW 14.8 11/05/2013 0715   LYMPHSABS 0.8 08/31/2013 1151   MONOABS 0.6 08/31/2013 1151   EOSABS 0.2 08/31/2013 1151   BASOSABS 0.0 08/31/2013 1151    BMET    Component Value Date/Time   NA 137 11/06/2013 0500   K 3.9 11/06/2013 0500   CL 100 11/06/2013 0500   CO2 28 11/06/2013 0500   GLUCOSE 184* 11/06/2013 0500   BUN 19 11/06/2013 0500   CREATININE 2.08* 11/06/2013 0500   CALCIUM 8.3* 11/06/2013 0500   GFRNONAA 22* 11/06/2013 0500   GFRAA 25* 11/06/2013 0500    COAGS: Lab Results  Component Value Date   INR 1.03 10/30/2013   INR 1.07 08/21/2013   INR 0.98 02/09/2013     Non-Invasive Vascular Imaging:  pending   ASSESSMENT: This is a 78 y.o. female in need of permanent HD access.  Pt is right handed.  PLAN: -will need bilateral upper extremity vein mapping-put the order for this in today.  She is going for a diatek catheter placement this afternoon in radiology. -will formulate plan once vein mapping is complete.   Doreatha Massed, PA-C [facility] [phone]

## 2013-11-06 NOTE — Progress Notes (Signed)
Patient ID: Nicole [name], female   DOB: 04-20-1935, 78 y.o.   MRN: [id number]  Patient Name: Nicole [name] Date of Encounter: 11/06/2013  SUBJECTIVE: The patient states that she felt SOB the last night and required oxygen. She feels more dyspneic today. Her RUE is swollen.  CURRENT MEDS . amLODipine  10 mg Oral Daily  . aspirin EC  81 mg Oral Daily  . atorvastatin  20 mg Oral q1800  . carvedilol  12.5 mg Oral BID WC  .  ceFAZolin (ANCEF) IV  2 g Intravenous On Call  . cholecalciferol  2,000 Units Oral Daily  . enoxaparin (LOVENOX) injection  30 mg Subcutaneous Q24H  . hydrALAZINE  100 mg Oral TID  . insulin aspart  0-9 Units Subcutaneous TID WC  . insulin glargine  12 Units Subcutaneous QHS  . isosorbide mononitrate  20 mg Oral BID  . levothyroxine  75 mcg Oral QAC breakfast  . pantoprazole  40 mg Oral Daily  . sodium chloride  3 mL Intravenous Q12H    OBJECTIVE  Filed Vitals:   11/05/13 1404 11/05/13 2211 11/06/13 0204 11/06/13 0444  BP: 151/40 144/50 148/46 167/52  Pulse: 69 91 75 73  Temp: 97.9 F (36.6 C) 98.8 F (37.1 C) 97.8 F (36.6 C) 98.4 F (36.9 C)  TempSrc: Oral Oral Oral Oral  Resp: 19 18 18 16   Height:      Weight:    130 lb 4.8 oz (59.104 kg)  SpO2: 97% 94% 95% 94%    Intake/Output Summary (Last 24 hours) at 11/06/13 0802 Last data filed at 11/06/13 0700  Gross per 24 hour  Intake    882 ml  Output    300 ml  Net    582 ml   Filed Weights   11/04/13 1753 11/05/13 0502 11/06/13 0444  Weight: 127 lb 6.4 oz (57.788 kg) 127 lb (57.607 kg) 130 lb 4.8 oz (59.104 kg)   PHYSICAL EXAM  General: Pleasant, NAD. Lying in bed Neuro: Alert and oriented X 3. Moves all extremities spontaneously. Psych: Normal affect. HEENT:  Normal  Neck: Supple without bruits, JVD to jaw Lungs:  Resp regular and unlabored, crackles up to the mid lungs B/L Heart: RRR no s3, s4, SEM  murmurs. Abdomen: Soft, non-tender, distended, BS + x 4.  Extremities: No clubbing,  cyanosis, bilateral LE 2+ to thighs, 2-3+ bilateral UE.; very tight tender skin,  Radials 2+, DP/DT non-pulpable B/L. Right IJ with right arm not tender but more swollen than left especially in forearm   Accessory Clinical Findings  CBC  Recent Labs  11/04/13 0416 11/05/13 0715  WBC 6.9 5.5  HGB 11.5* 10.7*  HCT 34.7* 32.3*  MCV 95.9 94.2  PLT 154 138*   Basic Metabolic Panel  Recent Labs  11/05/13 0718 11/06/13 0500  NA 137 137  K 3.2* 3.9  CL 101 100  CO2 28 28  GLUCOSE 156* 184*  BUN 11 19  CREATININE 1.52* 2.08*  CALCIUM 8.0* 8.3*  PHOS 2.0* 2.4   BNP (last 3 results)  Recent Labs  02/13/13 1145 02/14/13 0540 10/29/13 1719  PROBNP 2031.0* 1703.0* 5412.0*   TTE 08/13/2013 Study Conclusions  - Left ventricle: The cavity size was normal. Systolic function was vigorous. The estimated ejection fraction was in the range of 65% to 70%. Wall motion was normal; there were no regional wall motion abnormalities. Features are consistent with a pseudonormal left ventricular filling pattern, with concomitant abnormal relaxation and increased filling  pressure (grade 2 diastolic dysfunction). - Mitral valve: Calcified annulus. Mild regurgitation. - Left atrium: The atrium was mildly dilated. - Right atrium: The atrium was mildly dilated. Impressions: - Compared to the prior study, there has been no significant interval change.  Left heart cath 05/08/2011   1. Well-known preserved left ventricular systolic function.  2. Calcified disease of the proximal circumflex coronary artery which  is a dominant vessel of uncertain severity with normal flow  characteristics in a patient with stable symptoms.   DISPOSITION: Based upon the findings of the flow wire, a continued  medical therapy would be recommended. The patient was at moderately  high risk. She is an insulin-dependent diabetic that would likely  require drug-eluting platform. She has a baseline hemoglobin of  about  10 today, and she has had a history of proliferative retinopathy as well  as some rectal bleeding on antiplatelet therapy. As she is stable  without current symptoms, a continued approach will be recommended.  Arturo Morton. Riley Kill, MD, [facility]   Radiology/Studies  Dg Chest 2 View  10/29/2013   CLINICAL DATA:  Chest pain.  Shortness of breath.  Fluid overload.  EXAM: CHEST  2 VIEW  COMPARISON:  08/12/2013  FINDINGS: Mild cardiomegaly is stable. Increased diffuse interstitial infiltrates are seen, consistent with interstitial edema. Small bilateral pleural effusions of also increased in size since previous study.  IMPRESSION: Worsening congestive heart failure with increased small bilateral pleural effusions.  TELE:  Sinus bradycardia , 55-60 BPM 11/06/2013  ECG SR, normal ECG, 60-65 BPM    ASSESSMENT AND PLAN  78 yo woman with PMH of CKD stage IV, T2DM on insulin, PAD, hypertension, known CAD, acute on chronic HFpEF here with pulmonary edema.  1. Acute on chronic heart failure with preserved ejection fraction: Admitted for weight gain and DOE, found to be in pulmonary edema, she was hospitalized in September and followed every 2 weeks in the clinic since then. She has never reached comfortable state, always SOB, currently at rest NYHA IV. Started on renal dose of Dopamin on 11/25 with minimal diuresis. Dialysis started on 11/29.  Plans for tunneled catheter early this week.  Upper extremity duplex to r/o DVT in subclavian or axillary veins is pending. The next HD planned for tomorrow, we will request for today as her SOB is worsening and her weight is increasing.   2. Acute exacerbation of diastolic heart failure: Refractory to medical management and started on dialysis. Tunneled catheter early this week, nephrology not optimistic regarding kidney recovery, will likely need permanent dialysis to stay out of hospital  3. Resistant hypertension:  Continue current meds.  - No ACE-I with  CRF.  We will add clonidine 0.1 mg PO BID  4. CAD: continue medical management.   5. Peripheral arterial disease: B/L, stable currently.  6. Anemia of chronic kidney disease: Hemoglobin currently at goal-we'll start ESA with her declining hemoglobin. Recheck iron stores.    Virgel Manifold, Rexene Edison  11/06/2013

## 2013-11-07 ENCOUNTER — Inpatient Hospital Stay (HOSPITAL_COMMUNITY): Payer: Medicare Other

## 2013-11-07 DIAGNOSIS — E108 Type 1 diabetes mellitus with unspecified complications: Secondary | ICD-10-CM

## 2013-11-07 DIAGNOSIS — N185 Chronic kidney disease, stage 5: Secondary | ICD-10-CM

## 2013-11-07 LAB — GLUCOSE, CAPILLARY
Glucose-Capillary: 198 mg/dL — ABNORMAL HIGH (ref 70–99)
Glucose-Capillary: 134 mg/dL — ABNORMAL HIGH (ref 70–99)
Glucose-Capillary: 82 mg/dL (ref 70–99)
Glucose-Capillary: 118 mg/dL — ABNORMAL HIGH (ref 70–99)
Glucose-Capillary: 281 mg/dL — ABNORMAL HIGH (ref 70–99)

## 2013-11-07 LAB — CBC
HCT: 32.2 % — ABNORMAL LOW (ref 36.0–46.0)
Hemoglobin: 10.9 g/dL — ABNORMAL LOW (ref 12.0–15.0)
MCH: 32.2 pg (ref 26.0–34.0)
MCV: 95 fL (ref 78.0–100.0)
Platelets: 159 10*3/uL (ref 150–400)
RBC: 3.39 MIL/uL — ABNORMAL LOW (ref 3.87–5.11)
WBC: 5.2 10*3/uL (ref 4.0–10.5)

## 2013-11-07 LAB — RENAL FUNCTION PANEL
CO2: 26 mEq/L (ref 19–32)
Chloride: 100 mEq/L (ref 96–112)
GFR calc Af Amer: 23 mL/min — ABNORMAL LOW (ref >90)
Glucose, Bld: 238 mg/dL — ABNORMAL HIGH (ref 70–99)
Phosphorus: 3.2 mg/dL (ref 2.3–4.6)
Potassium: 4.4 mEq/L (ref 3.5–5.1)
Sodium: 136 mEq/L (ref 135–145)

## 2013-11-07 LAB — IRON AND TIBC
Iron: 39 ug/dL — ABNORMAL LOW (ref 42–135)
TIBC: 169 ug/dL — ABNORMAL LOW (ref 250–470)
UIBC: 130 ug/dL (ref 125–400)

## 2013-11-07 LAB — PROTIME-INR: INR: 1.03 (ref 0.00–1.49)

## 2013-11-07 MED ORDER — CEFAZOLIN SODIUM-DEXTROSE 2-3 GM-% IV SOLR: 2.0000 g | Freq: Once | INTRAVENOUS | Status: DC

## 2013-11-07 MED ORDER — GELATIN ABSORBABLE 12-7 MM EX MISC
CUTANEOUS | Status: AC
  Filled 2013-11-07: qty 1

## 2013-11-07 MED ORDER — SODIUM CHLORIDE 0.9 % IV SOLN
125.0000 mg | INTRAVENOUS | Status: DC
  Filled 2013-11-07 (×2): qty 10

## 2013-11-07 MED ORDER — SODIUM CHLORIDE 0.9 % IV SOLN
125.0000 mg | INTRAVENOUS | Status: AC
  Administered 2013-11-07: 125 mg via INTRAVENOUS
  Filled 2013-11-07: qty 10

## 2013-11-07 MED ORDER — FENTANYL CITRATE 0.05 MG/ML IJ SOLN
INTRAMUSCULAR | Status: AC | PRN
  Administered 2013-11-07: 25 ug via INTRAVENOUS

## 2013-11-07 MED ORDER — HEPARIN SODIUM (PORCINE) 1000 UNIT/ML IJ SOLN
1100.0000 [IU] | Freq: Once | INTRAMUSCULAR | Status: AC
  Administered 2013-11-07: 13:00:00 1100 [IU] via INTRAVENOUS

## 2013-11-07 MED ORDER — MIDAZOLAM HCL 2 MG/2ML IJ SOLN
INTRAMUSCULAR | Status: AC | PRN
  Administered 2013-11-07: 10:00:00 1 mg via INTRAVENOUS

## 2013-11-07 MED ORDER — MIDAZOLAM HCL 2 MG/2ML IJ SOLN
INTRAMUSCULAR | Status: AC
  Filled 2013-11-07: qty 4

## 2013-11-07 MED ORDER — SODIUM CHLORIDE 0.9 % IV SOLN: 125.0000 mg | INTRAVENOUS | Status: DC

## 2013-11-07 MED ORDER — DARBEPOETIN ALFA-POLYSORBATE 25 MCG/0.42ML IJ SOLN
INTRAMUSCULAR | Status: AC
  Filled 2013-11-07: qty 0.42

## 2013-11-07 MED ORDER — HEPARIN SODIUM (PORCINE) 1000 UNIT/ML IJ SOLN
INTRAMUSCULAR | Status: AC
  Filled 2013-11-07: qty 1

## 2013-11-07 MED ORDER — FENTANYL CITRATE 0.05 MG/ML IJ SOLN
INTRAMUSCULAR | Status: AC
  Filled 2013-11-07: qty 2

## 2013-11-07 NOTE — Progress Notes (Signed)
IR asked to eval bruising at site of right neck. Mew Tunneled HD cath placed this am.  Catheter intact, dressing dry. No swelling at insertion site or around catheter. Small amount of ecchymosis at site of previous temp cath  There is also what appears to be old ecchymosis in the right trapexius region, NT, soft. This is likely old and possibly occurred at time temp cath was placed.  Do not feel any acute bleeding/oozing is occurring at this time. Safe to use of HD today.  Brayton El PA-C Interventional Radiology 11/07/2013 12:45 PM

## 2013-11-07 NOTE — Procedures (Signed)
I have personally attended this patient's dialysis session.   Initiating HD via new tunnelled right IJ HD cath. To get darbe 25 and a dose of iron with today's treatment   Nicole [name] B

## 2013-11-07 NOTE — Progress Notes (Signed)
Patient ID: IZAMAR LINDEN, female   DOB: 1935-12-06, 78 y.o.   MRN: [id number]  Patient Name: Nicole [name] Date of Encounter: 11/07/2013  SUBJECTIVE: The patient feels very tires. Her SOB is unchanged since yesterday.    CURRENT MEDS . amLODipine  10 mg Oral Daily  . aspirin EC  81 mg Oral Daily  . atorvastatin  20 mg Oral q1800  . carvedilol  12.5 mg Oral BID WC  .  ceFAZolin (ANCEF) IV  2 g Intravenous On Call  .  ceFAZolin (ANCEF) IV  2 g Intravenous Once  . cholecalciferol  2,000 Units Oral Daily  . cloNIDine  0.1 mg Oral BID  . darbepoetin (ARANESP) injection - DIALYSIS  25 mcg Intravenous Q Tue-HD  . enoxaparin (LOVENOX) injection  30 mg Subcutaneous Q24H  . hydrALAZINE  100 mg Oral TID  . insulin aspart  0-9 Units Subcutaneous TID WC  . insulin glargine  12 Units Subcutaneous QHS  . isosorbide mononitrate  20 mg Oral BID  . levothyroxine  75 mcg Oral QAC breakfast  . pantoprazole  40 mg Oral Daily  . sodium chloride  3 mL Intravenous Q12H    OBJECTIVE  Filed Vitals:   11/06/13 2155 11/07/13 0618 11/07/13 0827 11/07/13 0917  BP: 144/42 147/49 151/33 150/39  Pulse: 66 65 59   Temp: 98.3 F (36.8 C) 98.2 F (36.8 C)    TempSrc: Oral Oral    Resp: 18 18    Height:      Weight:  131 lb 3.2 oz (59.512 kg)    SpO2: 96% 99%      Intake/Output Summary (Last 24 hours) at 11/07/13 0932 Last data filed at 11/07/13 0600  Gross per 24 hour  Intake    600 ml  Output    100 ml  Net    500 ml   Filed Weights   11/05/13 0502 11/06/13 0444 11/07/13 0618  Weight: 127 lb (57.607 kg) 130 lb 4.8 oz (59.104 kg) 131 lb 3.2 oz (59.512 kg)   PHYSICAL EXAM  General: Pleasant, NAD. Lying in bed Neuro: Alert and oriented X 3. Moves all extremities spontaneously. Psych: Normal affect. HEENT:  Normal  Neck: Supple without bruits, JVD to jaw Lungs:  Resp regular and unlabored, crackles up to the mid lungs B/L Heart: RRR no s3, s4, SEM  murmurs. Abdomen: Soft, non-tender,  distended, BS + x 4.  Extremities: No clubbing, cyanosis, bilateral LE 2+ to thighs, 2-3+ bilateral UE.; very tight tender skin,  Radials 2+, DP/DT non-pulpable B/L. Right IJ with right arm not tender but more swollen than left especially in forearm   Accessory Clinical Findings  CBC  Recent Labs  11/05/13 0715 11/07/13 0524  WBC 5.5 5.2  HGB 10.7* 10.9*  HCT 32.3* 32.2*  MCV 94.2 95.0  PLT 138* 159   Basic Metabolic Panel  Recent Labs  11/06/13 0500 11/07/13 0524  NA 137 136  K 3.9 4.4  CL 100 100  CO2 28 26  GLUCOSE 184* 238*  BUN 19 25*  CREATININE 2.08* 2.29*  CALCIUM 8.3* 8.3*  PHOS 2.4 3.2   BNP (last 3 results)  Recent Labs  02/13/13 1145 02/14/13 0540 10/29/13 1719  PROBNP 2031.0* 1703.0* 5412.0*   TTE 08/13/2013 Study Conclusions  - Left ventricle: The cavity size was normal. Systolic function was vigorous. The estimated ejection fraction was in the range of 65% to 70%. Wall motion was normal; there were no regional wall motion abnormalities.  Features are consistent with a pseudonormal left ventricular filling pattern, with concomitant abnormal relaxation and increased filling pressure (grade 2 diastolic dysfunction). - Mitral valve: Calcified annulus. Mild regurgitation. - Left atrium: The atrium was mildly dilated. - Right atrium: The atrium was mildly dilated. Impressions: - Compared to the prior study, there has been no significant interval change.  Left heart cath 05/08/2011   1. Well-known preserved left ventricular systolic function.  2. Calcified disease of the proximal circumflex coronary artery which  is a dominant vessel of uncertain severity with normal flow  characteristics in a patient with stable symptoms.   DISPOSITION: Based upon the findings of the flow wire, a continued  medical therapy would be recommended. The patient was at moderately  high risk. She is an insulin-dependent diabetic that would likely  require  drug-eluting platform. She has a baseline hemoglobin of about  10 today, and she has had a history of proliferative retinopathy as well  as some rectal bleeding on antiplatelet therapy. As she is stable  without current symptoms, a continued approach will be recommended.  Arturo Morton. Riley Kill, MD, [facility]   Radiology/Studies  Dg Chest 2 View  10/29/2013   CLINICAL DATA:  Chest pain.  Shortness of breath.  Fluid overload.  EXAM: CHEST  2 VIEW  COMPARISON:  08/12/2013  FINDINGS: Mild cardiomegaly is stable. Increased diffuse interstitial infiltrates are seen, consistent with interstitial edema. Small bilateral pleural effusions of also increased in size since previous study.  IMPRESSION: Worsening congestive heart failure with increased small bilateral pleural effusions.  TELE:  Sinus bradycardia , 55-60 BPM 11/07/2013  ECG SR, normal ECG, 60-65 BPM    ASSESSMENT AND PLAN  78 yo woman with PMH of CKD stage IV, T2DM on insulin, PAD, hypertension, known CAD, acute on chronic HFpEF here with pulmonary edema.  1. Acute on chronic heart failure with preserved ejection fraction: Admitted for weight gain and DOE, found to be in pulmonary edema, she was hospitalized in September and followed every 2 weeks in the clinic since then. She has never reached comfortable state, always SOB, currently at rest NYHA IV. Started on renal dose of Dopamin on 11/25 with minimal diuresis. Dialysis started on 11/29. The next HD planned for today.   2. Acute on chronic kidney failure : Refractory to medical management and started on dialysis. Perm cath placed today, nephrology not optimistic regarding kidney recovery, vein mapping performed, fistula scheduled for the later this week.  3. Resistant hypertension:  Continue current meds.  - No ACE-I with CRF.  Improving with clonidine 0.1 mg PO BID, further improvement expected with HD  4. CAD: continue medical management.   5. Peripheral arterial disease: B/L, stable  currently.  6. Anemia of chronic kidney disease: Hemoglobin currently at goal-we'll start ESA with her declining hemoglobin. Recheck iron stores.   7. Right upper extremity swelling - There is no DVT or SVT noted in the right upper extremity.   Virgel Manifold, Rexene Edison  11/07/2013

## 2013-11-07 NOTE — Progress Notes (Signed)
Report given to receiving RN. Patient is stable. No verbal complaints. No signs or symptoms of distress or discomfort. 

## 2013-11-07 NOTE — Progress Notes (Signed)
11/07/2013 9:09 AM Hemodialysis Outpatient Note; this patient has been accepted at the [facility] on a Monday,Wednesday and Friday 2nd shift schedule. The center can begin treatment tomorrow Wednesday December 3rd. Please have patient arrive at the center at 11:30 AM to sign paperwork. Thank you. Tilman Neat

## 2013-11-07 NOTE — Progress Notes (Signed)
PA arrived and further assessed patient. Ok to transfer patient to hemodialysis unit per PA. Will notify hemo unit. BP is 136/32 and HR 83. Will continue to monitor patient for further changes in condition.

## 2013-11-07 NOTE — Consult Note (Signed)
History details as above.  Pt is right handed.  Vein map pending.  Absent radial pulses bilaterally 2+ brachial pulses.  Fabienne Bruns, MD [facility] [place] Office: [phone] Pager: [phone]

## 2013-11-07 NOTE — Progress Notes (Signed)
Assessed bruising to pt's right upper shoulder. Delayed transfer to hemodialysis unit until further assessment has been done and noted. BP 138/31 and HR 56. No verbal complaints of pain and no signs or symptoms of distress or discomfort. Hemo and IR units made aware. IR paged PA to follow up on patient. Will continue to monitor patient for further changes in condition.

## 2013-11-07 NOTE — Procedures (Signed)
Successful RT IJ HD CATH TIP SVC/RA NO COMP STABLE READY FOR USE  

## 2013-11-07 NOTE — Progress Notes (Signed)
Subjective: back from placement of [facility] Initiating HD Feels OK Wondering about when her permanent access is to be placed Has an outpt spot at [facility] on a MWF schedule, 2nd shift  Objective Vital signs in last 24 hours: Filed Vitals:   11/07/13 1130 11/07/13 1200 11/07/13 1230 11/07/13 1254  BP: 139/35 142/34 136/32   Pulse: 57 53 53   Temp:      TempSrc:      Resp:      Height:      Weight:    59.5 kg (131 lb 2.8 oz)  SpO2:       Weight change: 0.408 kg (14.4 oz)  Intake/Output Summary (Last 24 hours) at 11/07/13 1300 Last data filed at 11/07/13 1119  Gross per 24 hour  Intake    603 ml  Output    100 ml  Net    503 ml   Physical Exam:  Blood pressure 136/32, pulse 53, temperature 98 F (36.7 C), temperature source Oral, resp. rate 20, height 5\' 4"  (1.626 m), weight 59.5 kg (131 lb 2.8 oz), SpO2 93.00%. Frail, pleasant older woman NAD New right TDC with some bruising on neck at cath site Lungs grossly clear S1S2 No S3  Abd soft and non tender Woody edema below the knees, some pitting in thighs Right UE with some edema  Labs: Basic Metabolic Panel:  Recent Labs Lab 11/02/13 0800 11/03/13 0500 11/04/13 0416 11/04/13 0900 11/05/13 0718 11/06/13 0500 11/07/13 0524  NA 133* 134* 138 139 137 137 136  K 3.8 3.5 3.6 3.4* 3.2* 3.9 4.4  CL 95* 97 101 104 101 100 100  CO2 26 26 29 25 28 28 26   GLUCOSE 255* 115* 143* 115* 156* 184* 238*  BUN 24* 29* 15 14 11 19  25*  CREATININE 1.53* 1.80* 1.50* 1.47* 1.52* 2.08* 2.29*  CALCIUM 8.4 8.4 8.1* 7.2* 8.0* 8.3* 8.3*  PHOS  --  3.3 2.1*  --  2.0* 2.4 3.2   Liver Function Tests:  Recent Labs Lab 11/05/13 0718 11/06/13 0500 11/07/13 0524  ALBUMIN 2.2* 2.3* 2.2*   Recent Labs Lab 11/03/13 0500 11/04/13 0416 11/05/13 0715 11/07/13 0524  WBC 7.4 6.9 5.5 5.2  HGB 11.9* 11.5* 10.7* 10.9*  HCT 35.3* 34.7* 32.3* 32.2*  MCV 94.1 95.9 94.2 95.0  PLT 191 154 138* 159   Recent Labs Lab 11/06/13 1347 11/06/13 1625  11/06/13 2152 11/07/13 0612 11/07/13 1109  GLUCAP 122* 272* 234* 198* 134*    Iron Studies: No results found for this basename: IRON, TIBC, TRANSFERRIN, FERRITIN,  in the last 168 hours Studies/Results: Ir Fluoro Guide Cv Line Right  11/07/2013   CLINICAL DATA:  End-stage renal disease, no current long-term access  EXAM: ULTRASOUND GUIDANCE FOR VASCULAR ACCESS  Right internal jugular PERMANENT HEMODIALYSIS CATHETER  Date:  12/2/201412/01/2013 10:31 AM  Radiologist:  Judie Petit. Ruel Favors, MD  Guidance:  Ultrasound and fluoroscopic  MEDICATIONS AND MEDICAL HISTORY: 2 g Ancef administered within 1 hr of the procedure, 1 mg Versed said, 25 mcg fentanyl  ANESTHESIA/SEDATION: 15 min  CONTRAST:  None.  FLUOROSCOPY TIME:  24 seconds  PROCEDURE: Informed consent was obtained from the patient following explanation of the procedure, risks, benefits and alternatives. The patient understands, agrees and consents for the procedure. All questions were addressed. A time out was performed.  Maximal barrier sterile technique utilized including caps, mask, sterile gowns, sterile gloves, large sterile drape, hand hygiene, and 2% chlorhexidine scrub.  Under sterile conditions and local  anesthesia, right internal jugular micropuncture venous access was performed with ultrasound. Images were obtained for documentation. A guide wire was inserted followed by a transitional dilator. Next, a 0.035 guidewire was advanced into the IVC with a 5-French catheter. Measurements were obtained from the right venotomy site to the proximal right atrium. In the right infraclavicular chest, a subcutaneous tunnel was created under sterile conditions and local anesthesia. 1% lidocaine with epinephrine was utilized for this. The 23 cm tip to cuff hemosplit catheter was tunneled subcutaneously to the venotomy site and inserted into the SVC/RA junction through a valved peel-away sheath. Position was confirmed with fluoroscopy. Images were obtained for  documentation. Blood was aspirated from the catheter followed by saline and heparin flushes. The appropriate volume and strength of heparin was instilled in each lumen. Caps were applied. The catheter was secured at the tunnel site with Gelfoam and a pursestring suture. The venotomy site was closed with subcuticular Vicryl suture. Dermabond was applied to the small right neck incision. A dry sterile dressing was applied. The catheter is ready for use. No immediate complications.  COMPLICATIONS: No immediate  IMPRESSION: Ultrasound and fluoroscopically guided right internal jugular tunneled hemodialysis catheter (23 cm tip to cuff hemosplit catheter).   Electronically Signed   By: Ruel Favors M.D.   On: 11/07/2013 11:30   Ir US Guide Vasc Access Right  11/07/2013   CLINICAL DATA:  End-stage renal disease, no current long-term access  EXAM: ULTRASOUND GUIDANCE FOR VASCULAR ACCESS  Right internal jugular PERMANENT HEMODIALYSIS CATHETER  Date:  12/2/201412/01/2013 10:31 AM  Radiologist:  Judie Petit. Ruel Favors, MD  Guidance:  Ultrasound and fluoroscopic  MEDICATIONS AND MEDICAL HISTORY: 2 g Ancef administered within 1 hr of the procedure, 1 mg Versed said, 25 mcg fentanyl  ANESTHESIA/SEDATION: 15 min  CONTRAST:  None.  FLUOROSCOPY TIME:  24 seconds  PROCEDURE: Informed consent was obtained from the patient following explanation of the procedure, risks, benefits and alternatives. The patient understands, agrees and consents for the procedure. All questions were addressed. A time out was performed.  Maximal barrier sterile technique utilized including caps, mask, sterile gowns, sterile gloves, large sterile drape, hand hygiene, and 2% chlorhexidine scrub.  Under sterile conditions and local anesthesia, right internal jugular micropuncture venous access was performed with ultrasound. Images were obtained for documentation. A guide wire was inserted followed by a transitional dilator. Next, a 0.035 guidewire was advanced into  the IVC with a 5-French catheter. Measurements were obtained from the right venotomy site to the proximal right atrium. In the right infraclavicular chest, a subcutaneous tunnel was created under sterile conditions and local anesthesia. 1% lidocaine with epinephrine was utilized for this. The 23 cm tip to cuff hemosplit catheter was tunneled subcutaneously to the venotomy site and inserted into the SVC/RA junction through a valved peel-away sheath. Position was confirmed with fluoroscopy. Images were obtained for documentation. Blood was aspirated from the catheter followed by saline and heparin flushes. The appropriate volume and strength of heparin was instilled in each lumen. Caps were applied. The catheter was secured at the tunnel site with Gelfoam and a pursestring suture. The venotomy site was closed with subcuticular Vicryl suture. Dermabond was applied to the small right neck incision. A dry sterile dressing was applied. The catheter is ready for use. No immediate complications.  COMPLICATIONS: No immediate  IMPRESSION: Ultrasound and fluoroscopically guided right internal jugular tunneled hemodialysis catheter (23 cm tip to cuff hemosplit catheter).   Electronically Signed   By: Beryle Beams  Shick M.D.   On: 11/07/2013 11:30   Medications:   . amLODipine  10 mg Oral Daily  . aspirin EC  81 mg Oral Daily  . atorvastatin  20 mg Oral q1800  . carvedilol  12.5 mg Oral BID WC  . cholecalciferol  2,000 Units Oral Daily  . cloNIDine  0.1 mg Oral BID  . darbepoetin (ARANESP) injection - DIALYSIS  25 mcg Intravenous Q Tue-HD  . enoxaparin (LOVENOX) injection  30 mg Subcutaneous Q24H  . fentaNYL      . gelatin adsorbable      . heparin      . hydrALAZINE  100 mg Oral TID  . insulin aspart  0-9 Units Subcutaneous TID WC  . insulin glargine  12 Units Subcutaneous QHS  . isosorbide mononitrate  20 mg Oral BID  . levothyroxine  75 mcg Oral QAC breakfast  . midazolam      . pantoprazole  40 mg Oral Daily   . sodium chloride  3 mL Intravenous Q12H    I  have reviewed scheduled and prn medications.  ASSESSMENT/RECOMMENDATIONS:  AKI on CKD4 - now new ESRD Secondary to acute cardiorenal syndrome (acute exacerbation of diastolic heart failure).  IR has exchanged temp cath for [facility] on the right VVS on board for perm access - just need to find out when CLIPPED to [facility] [employer] MWF 2nd shift .  Getting HD today (off schedule since we just learned of her outpt spot)  Acute exacerbation of diastolic heart failure:  Refractory to medical management and started on dialysis.    Anemia of chronic kidney disease:  Hemoglobin currently over 10; check iron studies Low dose ESA  Iron load X 5 for TSAT of 24  Hypertension:  Will adjust antihypertensive agents and anticipate will improve further with ultrafiltration on hemodialysis   CKD-MBD  PTH was 158 in September;  Recheck pending  DISPOSITION Once permanent access in place could be D/C'd from a renal standpoint  Camille Bal, MD [facility] [phone] pager 11/07/2013, 1:00 PM

## 2013-11-07 NOTE — Progress Notes (Addendum)
VVS progress note  Vein mapping reviewed. Cephalic vein on right marginal. Small on left. Basilic veins bilat are of adequate size. Labs and meds reviewed  Lab Results  Component Value Date   WBC 5.2 11/07/2013   HGB 10.9* 11/07/2013   HCT 32.2* 11/07/2013   MCV 95.0 11/07/2013   PLT 159 11/07/2013    Will discuss with MD for timing and plan for perm access Discussed options of AVF vs Graft with the pt   ROCZNIAK,REGINA J 8:58 AM 11/07/2013   Details as above.  Cephalic vein not usable bilaterally.  Basilic vein reasonable at mid upper arm but antecubital and below is marginal.  Most likely left arm AV graft tomorrow possible basilic vein fistula.  Dr Lawson will be doing as his second case tomorrow.  Risks benefits procedure details discussed with patient  Charles Fields, MD [facility] Office: [phone] Pager: [phone]  

## 2013-11-08 ENCOUNTER — Inpatient Hospital Stay (HOSPITAL_COMMUNITY): Payer: Medicare Other | Admitting: Anesthesiology

## 2013-11-08 ENCOUNTER — Encounter (HOSPITAL_COMMUNITY): Payer: Medicare Other | Admitting: Anesthesiology

## 2013-11-08 ENCOUNTER — Encounter (HOSPITAL_COMMUNITY)
Admission: EM | Disposition: A | Discharge status: Home Health | Payer: Self-pay | Source: Home / Self Care | Attending: Cardiology

## 2013-11-08 ENCOUNTER — Encounter (HOSPITAL_COMMUNITY): Payer: Self-pay | Admitting: Anesthesiology

## 2013-11-08 DIAGNOSIS — N186 End stage renal disease: Secondary | ICD-10-CM

## 2013-11-08 HISTORY — PX: BASCILIC VEIN TRANSPOSITION: SHX5742

## 2013-11-08 LAB — RENAL FUNCTION PANEL
Albumin: 2.1 g/dL — ABNORMAL LOW (ref 3.5–5.2)
BUN: 11 mg/dL (ref 6–23)
Creatinine, Ser: 1.53 mg/dL — ABNORMAL HIGH (ref 0.50–1.10)
GFR calc non Af Amer: 32 mL/min — ABNORMAL LOW (ref >90)
Phosphorus: 2.3 mg/dL (ref 2.3–4.6)
Sodium: 133 mEq/L — ABNORMAL LOW (ref 135–145)

## 2013-11-08 LAB — GLUCOSE, CAPILLARY
Glucose-Capillary: 273 mg/dL — ABNORMAL HIGH (ref 70–99)
Glucose-Capillary: 160 mg/dL — ABNORMAL HIGH (ref 70–99)
Glucose-Capillary: 126 mg/dL — ABNORMAL HIGH (ref 70–99)
Glucose-Capillary: 139 mg/dL — ABNORMAL HIGH (ref 70–99)
Glucose-Capillary: 248 mg/dL — ABNORMAL HIGH (ref 70–99)

## 2013-11-08 LAB — PTH, INTACT AND CALCIUM: PTH: 134 pg/mL — ABNORMAL HIGH (ref 14.0–72.0)

## 2013-11-08 SURGERY — TRANSPOSITION, VEIN, BASILIC
Anesthesia: General | Site: Arm Upper | Laterality: Left

## 2013-11-08 MED ORDER — DEXTROSE 5 % IV SOLN
1.5000 g | INTRAVENOUS | Status: DC | PRN
  Administered 2013-11-08: 1.5 g via INTRAVENOUS

## 2013-11-08 MED ORDER — ONDANSETRON HCL 4 MG/2ML IJ SOLN
INTRAMUSCULAR | Status: DC | PRN
  Administered 2013-11-08: 4 mg via INTRAVENOUS

## 2013-11-08 MED ORDER — LIDOCAINE-EPINEPHRINE (PF) 1 %-1:200000 IJ SOLN
INTRAMUSCULAR | Status: AC
  Filled 2013-11-08: qty 10

## 2013-11-08 MED ORDER — FENTANYL CITRATE 0.05 MG/ML IJ SOLN
INTRAMUSCULAR | Status: DC | PRN
  Administered 2013-11-08: 100 ug via INTRAVENOUS

## 2013-11-08 MED ORDER — ONDANSETRON HCL 4 MG/2ML IJ SOLN: 4.0000 mg | Freq: Once | INTRAMUSCULAR | Status: DC | PRN

## 2013-11-08 MED ORDER — LIDOCAINE HCL (CARDIAC) 20 MG/ML IV SOLN
INTRAVENOUS | Status: DC | PRN
  Administered 2013-11-08: 30 mg via INTRAVENOUS

## 2013-11-08 MED ORDER — 0.9 % SODIUM CHLORIDE (POUR BTL) OPTIME
TOPICAL | Status: DC | PRN
  Administered 2013-11-08: 1000 mL

## 2013-11-08 MED ORDER — HYDROCODONE-ACETAMINOPHEN 5-325 MG PO TABS: 1.0000 | ORAL_TABLET | ORAL | Status: DC | PRN

## 2013-11-08 MED ORDER — PROPOFOL 10 MG/ML IV BOLUS
INTRAVENOUS | Status: DC | PRN
  Administered 2013-11-08: 120 mg via INTRAVENOUS

## 2013-11-08 MED ORDER — FENTANYL CITRATE 0.05 MG/ML IJ SOLN
25.0000 ug | INTRAMUSCULAR | Status: DC | PRN
  Administered 2013-11-08 (×2): 25 ug via INTRAVENOUS

## 2013-11-08 MED ORDER — SODIUM CHLORIDE 0.9 % IR SOLN
Status: DC | PRN
  Administered 2013-11-08: 11:00:00

## 2013-11-08 MED ORDER — DEXTROSE 5 % IV SOLN
1.5000 g | INTRAVENOUS | Status: DC
  Filled 2013-11-08: qty 1.5

## 2013-11-08 MED ORDER — OXYCODONE HCL 5 MG PO TABS: 5.0000 mg | ORAL_TABLET | Freq: Once | ORAL | Status: DC | PRN

## 2013-11-08 MED ORDER — SODIUM CHLORIDE 0.9 % IV SOLN
INTRAVENOUS | Status: DC | PRN
  Administered 2013-11-08: 10:00:00 via INTRAVENOUS

## 2013-11-08 MED ORDER — OXYCODONE HCL 5 MG/5ML PO SOLN: 5.0000 mg | Freq: Once | ORAL | Status: DC | PRN

## 2013-11-08 MED ORDER — FENTANYL CITRATE 0.05 MG/ML IJ SOLN
INTRAMUSCULAR | Status: AC
  Filled 2013-11-08: qty 2

## 2013-11-08 SURGICAL SUPPLY — 38 items
ARMBAND PINK RESTRICT EXTREMIT (MISCELLANEOUS) ×3 IMPLANT
CANISTER SUCTION 2500CC (MISCELLANEOUS) ×3 IMPLANT
CLIP TI MEDIUM 6 (CLIP) ×3 IMPLANT
CLIP TI WIDE RED SMALL 6 (CLIP) ×9 IMPLANT
COVER PROBE W GEL 5X96 (DRAPES) IMPLANT
COVER SURGICAL LIGHT HANDLE (MISCELLANEOUS) ×3 IMPLANT
DERMABOND ADVANCED (GAUZE/BANDAGES/DRESSINGS) ×1
DERMABOND ADVANCED .7 DNX12 (GAUZE/BANDAGES/DRESSINGS) ×2 IMPLANT
ELECT REM PT RETURN 9FT ADLT (ELECTROSURGICAL) ×3
ELECTRODE REM PT RTRN 9FT ADLT (ELECTROSURGICAL) ×2 IMPLANT
GEL ULTRASOUND 20GR AQUASONIC (MISCELLANEOUS) ×3 IMPLANT
GLOVE BIOGEL PI IND STRL 7.0 (GLOVE) ×4 IMPLANT
GLOVE BIOGEL PI IND STRL 7.5 (GLOVE) ×2 IMPLANT
GLOVE BIOGEL PI INDICATOR 7.0 (GLOVE) ×2
GLOVE BIOGEL PI INDICATOR 7.5 (GLOVE) ×1
GLOVE SS BIOGEL STRL SZ 7 (GLOVE) ×4 IMPLANT
GLOVE SUPERSENSE BIOGEL SZ 7 (GLOVE) ×2
GLOVE SURG SS PI 6.5 STRL IVOR (GLOVE) ×3 IMPLANT
GOWN PREVENTION PLUS XLARGE (GOWN DISPOSABLE) ×6 IMPLANT
GOWN STRL NON-REIN LRG LVL3 (GOWN DISPOSABLE) ×9 IMPLANT
KIT BASIN OR (CUSTOM PROCEDURE TRAY) ×3 IMPLANT
KIT ROOM TURNOVER OR (KITS) ×3 IMPLANT
NEEDLE HYPO 25GX1X1/2 BEV (NEEDLE) ×3 IMPLANT
NS IRRIG 1000ML POUR BTL (IV SOLUTION) ×3 IMPLANT
PACK CV ACCESS (CUSTOM PROCEDURE TRAY) ×3 IMPLANT
PAD ARMBOARD 7.5X6 YLW CONV (MISCELLANEOUS) ×6 IMPLANT
SUT PROLENE 6 0 BV (SUTURE) ×3 IMPLANT
SUT PROLENE 7 0 BV 1 (SUTURE) ×3 IMPLANT
SUT SILK 2 0 FS (SUTURE) ×3 IMPLANT
SUT SILK 3 0 (SUTURE) ×1
SUT SILK 3-0 18XBRD TIE 12 (SUTURE) ×2 IMPLANT
SUT VIC AB 3-0 SH 27 (SUTURE) ×3
SUT VIC AB 3-0 SH 27X BRD (SUTURE) ×6 IMPLANT
SUT VICRYL 4-0 PS2 18IN ABS (SUTURE) ×3 IMPLANT
TOWEL OR 17X24 6PK STRL BLUE (TOWEL DISPOSABLE) ×3 IMPLANT
TOWEL OR 17X26 10 PK STRL BLUE (TOWEL DISPOSABLE) ×3 IMPLANT
UNDERPAD 30X30 INCONTINENT (UNDERPADS AND DIAPERS) ×3 IMPLANT
WATER STERILE IRR 1000ML POUR (IV SOLUTION) ×3 IMPLANT

## 2013-11-08 NOTE — H&P (View-Only) (Signed)
VVS progress note  Vein mapping reviewed. Cephalic vein on right marginal. Small on left. Basilic veins bilat are of adequate size. Labs and meds reviewed  Lab Results  Component Value Date   WBC 5.2 11/07/2013   HGB 10.9* 11/07/2013   HCT 32.2* 11/07/2013   MCV 95.0 11/07/2013   PLT 159 11/07/2013    Will discuss with MD for timing and plan for perm access Discussed options of AVF vs Graft with the pt   Lori-Ann Lindfors J 8:58 AM 11/07/2013   Details as above.  Cephalic vein not usable bilaterally.  Basilic vein reasonable at mid upper arm but antecubital and below is marginal.  Most likely left arm AV graft tomorrow possible basilic vein fistula.  Dr Hart Rochester will be doing as his second case tomorrow.  Risks benefits procedure details discussed with patient  Fabienne Bruns, MD [facility] [place] Office: [phone] Pager: [phone]

## 2013-11-08 NOTE — Anesthesia Preprocedure Evaluation (Addendum)
Anesthesia Evaluation  Patient identified by MRN, date of birth, ID band Patient awake    Reviewed: Allergy & Precautions, H&P , NPO status , Patient's Chart, lab work & pertinent test results, reviewed documented beta blocker date and time   Airway Mallampati: II TM Distance: >3 FB Neck ROM: Full    Dental  (+) Teeth Intact   Pulmonary shortness of breath and with exertion, former smoker,  breath sounds clear to auscultation        Cardiovascular hypertension, Pt. on medications + angina + CAD, + Past MI, + Cardiac Stents, + Peripheral Vascular Disease and +CHF Rhythm:Regular     Neuro/Psych    GI/Hepatic hiatal hernia, GERD-  Medicated,  Endo/Other  diabetes, Poorly Controlled, Type 2, Insulin DependentHypothyroidism   Renal/GU ARF, CRF and DialysisRenal disease     Musculoskeletal   Abdominal   Peds  Hematology  (+) anemia ,   Anesthesia Other Findings   Reproductive/Obstetrics                          Anesthesia Physical Anesthesia Plan  ASA: III  Anesthesia Plan: General   Post-op Pain Management:    Induction: Intravenous  Airway Management Planned: LMA and Oral ETT  Additional Equipment:   Intra-op Plan:   Post-operative Plan: Extubation in OR  Informed Consent: I have reviewed the patients History and Physical, chart, labs and discussed the procedure including the risks, benefits and alternatives for the proposed anesthesia with the patient or authorized representative who has indicated his/her understanding and acceptance.   Dental advisory given  Plan Discussed with: CRNA, Anesthesiologist and Surgeon  Anesthesia Plan Comments:         Anesthesia Quick Evaluation

## 2013-11-08 NOTE — Preoperative (Signed)
Beta Blockers   Reason not to administer Beta Blockers:Not Applicable 

## 2013-11-08 NOTE — Progress Notes (Signed)
Inpatient Diabetes Program Recommendations  AACE/ADA: New Consensus Statement on Inpatient Glycemic Control (2013)  Target Ranges:  Prepandial:   less than 140 mg/dL      Peak postprandial:   less than 180 mg/dL (1-2 hours)      Critically ill patients:  140 - 180 mg/dL   Earlu am hyperglycemia   Inpatient Diabetes Program Recommendations Insulin - Basal: Please consider increase in Lantus to 17 units. Fasting glucose is trending high Thank you, Lenor Coffin, RN, CNS, Diabetes Coordinator [phone])

## 2013-11-08 NOTE — Clinical Documentation Improvement (Signed)
THIS DOCUMENT IS NOT A PERMANENT PART OF THE MEDICAL RECORD  Please update your documentation with the medical record to reflect your response to this query. If you need help knowing how to do this please call [phone].  11/08/13   Dr. Delton See,  In a better effort to capture your patient's severity of illness, reflect appropriate length of stay and utilization of resources, a review of the patient medical record has revealed the following information:   - Admitted with Acute on Chronic Diastolic Heart Failure   - Known diagnosis of Hypertension, resistant to treatment this admission   - Echo from previous admission shows Grade 2 Diastolic Dysfunction   - CXR this admission show mild cardiomegaly   Based on your clinical judgment, please document in the progress notes and discharge summary if a condition below provides greater specificity regarding the patient's HTN, DHF and CM:   - Hypertensive Heart Disease   - Other Condition   - Unable to Clinically Determine   In responding to this query please exercise your independent judgment.    The fact that a query is asked, does not imply that any particular answer is desired or expected.   Reviewed: additional documentation in the medical record  Thank You,  Jerral Ralph RN BSN CCDS Certified Clinical Documentation Specialist: [phone] Health Information Management [place]  FYI, I have addressed your query at today's note   ASSESSMENT AND PLAN  78 yo woman with PMH of CKD stage IV, T2DM on insulin, PAD, hypertension, known CAD, acute on chronic HFpEF here with pulmonary edema.  1. Acute on chronic heart failure with preserved ejection fraction: Admitted for weight gain and DOE, found to be in pulmonary edema, she was hospitalized in September and followed every 2 weeks in the clinic since then. She has never reached comfortable state, always SOB, currently at rest NYHA IV. Failed attempt of diuresis with  dopamin. Started on HD on 11/25, scheduled for 3 x a week as an oupatient. The fistula was placed on her left arm yesterday, until it matures her perm cath will be used for HD. The patient feels significantly better.   2. Acute on chronic kidney failure : Refractory to medical management and started on dialysis. Scheduled for 3 x a week as an oupatient. The fistula was placed on her left arm yesterday, until it matures her perm cath will be used for HD.  3. Hypertension:  Now controlled after initiation of her HD. Not resistant anymore.  4. CAD: continue medical management.   5. Peripheral arterial disease: B/L, stable currently.  6. Anemia of chronic kidney disease: Hemoglobin currently at goal-we'll start ESA with her declining hemoglobin. Recheck iron stores.   7. Right upper extremity swelling - There is no DVT or SVT noted in the right upper extremity.  8. IDDM - controlled  The patient will be discharged today or tomorrow based on vascular surgery decision. She will be followed in our clinic the next week.  Virgel Manifold, Rexene Edison  11/09/2013

## 2013-11-08 NOTE — Interval H&P Note (Signed)
History and Physical Interval Note:  11/08/2013 10:22 AM  Nicole [name]  has presented today for surgery, with the diagnosis of ESRD  The various methods of treatment have been discussed with the patient and family. After consideration of risks, benefits and other options for treatment, the patient has consented to  Procedure(s): ARTERIOVENOUS (AV) FISTULA CREATION OR GRAFT INSERTION (Left) as a surgical intervention .  The patient's history has been reviewed, patient examined, no change in status, stable for surgery.  I have reviewed the patient's chart and labs.  Questions were answered to the patient's satisfaction.     Josephina Gip

## 2013-11-08 NOTE — Transfer of Care (Signed)
Immediate Anesthesia Transfer of Care Note  Patient: Nicole [name]  Procedure(s) Performed: Procedure(s): BASCILIC VEIN TRANSPOSITION (Left)  Patient Location: PACU  Anesthesia Type:General  Level of Consciousness: awake, alert  and oriented  Airway & Oxygen Therapy: Patient Spontanous Breathing and Patient connected to face mask oxygen  Post-op Assessment: Report given to PACU RN  Post vital signs: Reviewed and stable  Complications: No apparent anesthesia complications

## 2013-11-08 NOTE — Progress Notes (Signed)
Subjective:  Access placed today - LUE basilic transposition Arm "sore" but no other distress  Last HD was yesterday Has an outpt spot at [name] on a MWF schedule, 2nd shift  Objective Vital signs in last 24 hours: Filed Vitals:   11/08/13 0642 11/08/13 0906 11/08/13 1258 11/08/13 1300  BP: 147/38 150/36 133/53   Pulse: 67 59 56 56  Temp: 98.3 F (36.8 C) 97.7 F (36.5 C) 98.1 F (36.7 C)   TempSrc: Oral Oral    Resp: 18 18 16 17   Height:      Weight: 58.242 kg (128 lb 6.4 oz)     SpO2: 97% 97% 99% 96%   Weight change: -0.012 kg (-0.4 oz)  Intake/Output Summary (Last 24 hours) at 11/08/13 1306 Last data filed at 11/08/13 1230  Gross per 24 hour  Intake    920 ml  Output   2560 ml  Net  -1640 ml   Physical Exam:  BP 133/53  Pulse 56  Temp(Src) 98.1 F (36.7 C) (Oral)  Resp 17  Ht 5\' 4"  (1.626 m)  Wt 58.242 kg (128 lb 6.4 oz)  BMI 22.03 kg/m2  SpO2 96% Elderly WF NAD Lungs clear TDC right with dry dressing right Left upper AVF with multiple incisions, some erythema, no drainage and excellent bruit. Hand is warm Woody edema bilat LE's unchanged   Labs: Basic Metabolic Panel:  Recent Labs Lab 11/03/13 0500 11/04/13 0416 11/04/13 0900 11/05/13 0718 11/06/13 0500 11/07/13 0524 11/08/13 0540  NA 134* 138 139 137 137 136 133*  K 3.5 3.6 3.4* 3.2* 3.9 4.4 3.9  CL 97 101 104 101 100 100 96  CO2 26 29 25 28 28 26 27   GLUCOSE 115* 143* 115* 156* 184* 238* 298*  BUN 29* 15 14 11 19  25* 11  CREATININE 1.80* 1.50* 1.47* 1.52* 2.08* 2.29* 1.53*  CALCIUM 8.4 8.1* 7.2* 8.0* 8.3* 8.3* 7.7*  PHOS 3.3 2.1*  --  2.0* 2.4 3.2 2.3    Recent Labs Lab 11/06/13 0500 11/07/13 0524 11/08/13 0540  ALBUMIN 2.3* 2.2* 2.1*    Recent Labs Lab 11/03/13 0500 11/04/13 0416 11/05/13 0715 11/07/13 0524  WBC 7.4 6.9 5.5 5.2  HGB 11.9* 11.5* 10.7* 10.9*  HCT 35.3* 34.7* 32.3* 32.2*  MCV 94.1 95.9 94.2 95.0  PLT 191 154 138* 159    Recent Labs Lab 11/07/13 1849  11/07/13 2122 11/08/13 0624 11/08/13 1015 11/08/13 1300  GLUCAP 118* 281* 273* 160* 126*    Iron Studies:   Recent Labs Lab 11/07/13 1320  IRON 39*  TIBC 169*   Studies/Results: Ir Fluoro Guide Cv Line Right  11/07/2013   CLINICAL DATA:  End-stage renal disease, no current long-term access  EXAM: ULTRASOUND GUIDANCE FOR VASCULAR ACCESS  Right internal jugular PERMANENT HEMODIALYSIS CATHETER  Date:  12/2/201412/01/2013 10:31 AM  Radiologist:  Judie Petit. Ruel Favors, MD  Guidance:  Ultrasound and fluoroscopic  MEDICATIONS AND MEDICAL HISTORY: 2 g Ancef administered within 1 hr of the procedure, 1 mg Versed said, 25 mcg fentanyl  ANESTHESIA/SEDATION: 15 min  CONTRAST:  None.  FLUOROSCOPY TIME:  24 seconds  PROCEDURE: Informed consent was obtained from the patient following explanation of the procedure, risks, benefits and alternatives. The patient understands, agrees and consents for the procedure. All questions were addressed. A time out was performed.  Maximal barrier sterile technique utilized including caps, mask, sterile gowns, sterile gloves, large sterile drape, hand hygiene, and 2% chlorhexidine scrub.  Under sterile conditions and local anesthesia,  right internal jugular micropuncture venous access was performed with ultrasound. Images were obtained for documentation. A guide wire was inserted followed by a transitional dilator. Next, a 0.035 guidewire was advanced into the IVC with a 5-French catheter. Measurements were obtained from the right venotomy site to the proximal right atrium. In the right infraclavicular chest, a subcutaneous tunnel was created under sterile conditions and local anesthesia. 1% lidocaine with epinephrine was utilized for this. The 23 cm tip to cuff hemosplit catheter was tunneled subcutaneously to the venotomy site and inserted into the SVC/RA junction through a valved peel-away sheath. Position was confirmed with fluoroscopy. Images were obtained for documentation. Blood  was aspirated from the catheter followed by saline and heparin flushes. The appropriate volume and strength of heparin was instilled in each lumen. Caps were applied. The catheter was secured at the tunnel site with Gelfoam and a pursestring suture. The venotomy site was closed with subcuticular Vicryl suture. Dermabond was applied to the small right neck incision. A dry sterile dressing was applied. The catheter is ready for use. No immediate complications.  COMPLICATIONS: No immediate  IMPRESSION: Ultrasound and fluoroscopically guided right internal jugular tunneled hemodialysis catheter (23 cm tip to cuff hemosplit catheter).   Electronically Signed   By: Ruel Favors M.D.   On: 11/07/2013 11:30   Ir US Guide Vasc Access Right  11/07/2013   CLINICAL DATA:  End-stage renal disease, no current long-term access  EXAM: ULTRASOUND GUIDANCE FOR VASCULAR ACCESS  Right internal jugular PERMANENT HEMODIALYSIS CATHETER  Date:  12/2/201412/01/2013 10:31 AM  Radiologist:  Judie Petit. Ruel Favors, MD  Guidance:  Ultrasound and fluoroscopic  MEDICATIONS AND MEDICAL HISTORY: 2 g Ancef administered within 1 hr of the procedure, 1 mg Versed said, 25 mcg fentanyl  ANESTHESIA/SEDATION: 15 min  CONTRAST:  None.  FLUOROSCOPY TIME:  24 seconds  PROCEDURE: Informed consent was obtained from the patient following explanation of the procedure, risks, benefits and alternatives. The patient understands, agrees and consents for the procedure. All questions were addressed. A time out was performed.  Maximal barrier sterile technique utilized including caps, mask, sterile gowns, sterile gloves, large sterile drape, hand hygiene, and 2% chlorhexidine scrub.  Under sterile conditions and local anesthesia, right internal jugular micropuncture venous access was performed with ultrasound. Images were obtained for documentation. A guide wire was inserted followed by a transitional dilator. Next, a 0.035 guidewire was advanced into the IVC with a  5-French catheter. Measurements were obtained from the right venotomy site to the proximal right atrium. In the right infraclavicular chest, a subcutaneous tunnel was created under sterile conditions and local anesthesia. 1% lidocaine with epinephrine was utilized for this. The 23 cm tip to cuff hemosplit catheter was tunneled subcutaneously to the venotomy site and inserted into the SVC/RA junction through a valved peel-away sheath. Position was confirmed with fluoroscopy. Images were obtained for documentation. Blood was aspirated from the catheter followed by saline and heparin flushes. The appropriate volume and strength of heparin was instilled in each lumen. Caps were applied. The catheter was secured at the tunnel site with Gelfoam and a pursestring suture. The venotomy site was closed with subcuticular Vicryl suture. Dermabond was applied to the small right neck incision. A dry sterile dressing was applied. The catheter is ready for use. No immediate complications.  COMPLICATIONS: No immediate  IMPRESSION: Ultrasound and fluoroscopically guided right internal jugular tunneled hemodialysis catheter (23 cm tip to cuff hemosplit catheter).   Electronically Signed   By: Ruel Favors  M.D.   On: 11/07/2013 11:30   Medications:   . amLODipine  10 mg Oral Daily  . aspirin EC  81 mg Oral Daily  . atorvastatin  20 mg Oral q1800  . carvedilol  12.5 mg Oral BID WC  . cholecalciferol  2,000 Units Oral Daily  . cloNIDine  0.1 mg Oral BID  . darbepoetin (ARANESP) injection - DIALYSIS  25 mcg Intravenous Q Tue-HD  . enoxaparin (LOVENOX) injection  30 mg Subcutaneous Q24H  . fentaNYL      . [START ON 11/10/2013] ferric gluconate (FERRLECIT/NULECIT) IV  125 mg Intravenous Q M,W,F-HD  . hydrALAZINE  100 mg Oral TID  . insulin aspart  0-9 Units Subcutaneous TID WC  . insulin glargine  12 Units Subcutaneous QHS  . isosorbide mononitrate  20 mg Oral BID  . levothyroxine  75 mcg Oral QAC breakfast  .  pantoprazole  40 mg Oral Daily  . sodium chloride  3 mL Intravenous Q12H    I  have reviewed scheduled and prn medications.  ASSESSMENT/RECOMMENDATIONS:  New ESRD Secondary to acute cardiorenal syndrome (acute exacerbation of diastolic heart failure).  IR has exchanged temp cath for [facility] on the right (12/2) VVS has placed perm access (12/3 left basilic transposition Dr. Hart Rochester) CLIPPED to [facility] MWF 2nd shift and could start there on Friday EDW not established but will start with 58 kg and continue to adjust  Acute exacerbation of diastolic heart failure:  Refractory to medical management and started on dialysis.    Anemia of chronic kidney disease:  Hemoglobin currently over 10 Low dose ESA  Iron load X 5 for TSAT of 24  Hypertension:  Will adjust antihypertensive agents and anticipate will improve further with ultrafiltration on hemodialysis   CKD-MBD  PTH was 158 in September; 134 now - no VDRA needed at this time No need for binders (phos low side)  DISPOSITION With permanent access in place could be D/C'd from a renal standpoint  Camille Bal, MD [facility] [phone] pager 11/08/2013, 1:06 PM

## 2013-11-08 NOTE — Anesthesia Procedure Notes (Signed)
Procedure Name: LMA Insertion Performed by: Jejuan Scala Pre-anesthesia Checklist: Patient identified, Emergency Drugs available, Suction available, Patient being monitored and Timeout performed Patient Re-evaluated:Patient Re-evaluated prior to inductionOxygen Delivery Method: Circle system utilized Preoxygenation: Pre-oxygenation with 100% oxygen Intubation Type: IV induction Ventilation: Mask ventilation without difficulty LMA: LMA inserted LMA Size: 4.0 Laryngoscope size: Proseal LMA. Number of attempts: 1 (by EMS) Placement Confirmation: positive ETCO2,  CO2 detector and breath sounds checked- equal and bilateral Tube secured with: Tape Dental Injury: Teeth and Oropharynx as per pre-operative assessment

## 2013-11-08 NOTE — Progress Notes (Signed)
PT NOTE:  Pt off the floor at procedure.  Will check back as time permits. Clydie Braun L. Katrinka Blazing, [place] Pager [phone] 11/08/2013

## 2013-11-08 NOTE — Op Note (Signed)
OPERATIVE REPORT  Date of Surgery: 10/29/2013 - 11/08/2013  Surgeon: Josephina Gip, MD  Assistant: Lianne Cure PA  Pre-op Diagnosis: ESRD  Post-op Diagnosis: ESRD  Procedure: Procedure(s): BASCILIC VEIN TRANSPOSITION-left upper extremity  Anesthesia: LMA  EBL: Minimal  Complications: None  Procedure Details: Patient was taken the operating room placed in supine position at which time satisfactory LMA general anesthesia was administered. Left upper extremity was then imaged with the SonoSite ultrasound. Cephalic vein was quite small. Basilic vein was adequate in size for a basilic vein transposition. He was marked accordingly. After prepping and draping in the routine sterile manner the basilic vein was mobilized from the proximal forearm to the axilla. Branches were ligated with 3 and 4-0 silk ties and divided. An excellent vein good caliber at least 3 mm in size in all areas. Following this the brachial artery was exposed in the distal upper arm incision. An excellent pulse but did have some diffuse atherosclerotic changes. It was encircled with Vesseloops. A curvilinear tunnel was created on the anterior aspect of the arm and being careful to preserve orientation the basilic vein having been transected after ligating it distally was delivered through the tunnel beginning from the axillary wound and the distal upper arm wound. Brachial artery was occluded proximally and distally with vessel loops at with 15 blade and extended with Potts scissors. The vein was then carefully measured spatulated and anastomosed end-to-side with 6-0 Prolene. Vesseloops released there was an excellent pulse and palpable thrill in the fistula. There was excellent ulnar arterial flow which was not affected by the fistula being opened. A radial arterial flow was somewhat dampened. Adequate hemostasis was achieved. No heparin or protamine was given. The wounds were closed in layers of Vicryl in subcuticular fashion  with Dermabond patient recovering in stable condition   Josephina Gip, MD 11/08/2013 12:51 PM

## 2013-11-08 NOTE — Progress Notes (Signed)
Pt. Was taken for procedure to create new AV Fistula around 9:00 am and returned to Unit around 4:30 pm, pt. Stable and showed no signs and symptoms of distress but complained of pain rating at a 9. I gave pt. A Tramadol for the pain and pt. Stated pain rating decreased to a 5. Pt. Remained stable and showed no signs of distress.

## 2013-11-09 ENCOUNTER — Other Ambulatory Visit (HOSPITAL_COMMUNITY): Payer: Self-pay | Admitting: Nephrology

## 2013-11-09 ENCOUNTER — Encounter (HOSPITAL_COMMUNITY): Payer: Medicare Other

## 2013-11-09 ENCOUNTER — Telehealth: Payer: Self-pay | Admitting: Vascular Surgery

## 2013-11-09 LAB — GLUCOSE, CAPILLARY
Glucose-Capillary: 313 mg/dL — ABNORMAL HIGH (ref 70–99)
Glucose-Capillary: 304 mg/dL — ABNORMAL HIGH (ref 70–99)
Glucose-Capillary: 246 mg/dL — ABNORMAL HIGH (ref 70–99)
Glucose-Capillary: 193 mg/dL — ABNORMAL HIGH (ref 70–99)

## 2013-11-09 MED ORDER — AMLODIPINE BESYLATE 5 MG PO TABS
5.0000 mg | ORAL_TABLET | Freq: Every day | ORAL | Status: DC
  Filled 2013-11-09: qty 1

## 2013-11-09 NOTE — Telephone Encounter (Addendum)
Message copied by Fredrich Birks on Thu Nov 09, 2013  2:41 PM ------      Message from: Marlowe Shores      Created: Thu Nov 09, 2013  9:36 AM       4-6 weeks - Hart Rochester F/U AVF ------   11/09/13: lm for pt re appt, dpm

## 2013-11-09 NOTE — Progress Notes (Signed)
Subjective:  Access placed yesterday - LUE basilic transposition Arm "sore and stiff" but no symptoms of steal Due for HD tomorrow to get onto outpt schedule Has an outpt spot at [facility] on a MWF schedule, 2nd shift  Objective Vital signs in last 24 hours: Filed Vitals:   11/08/13 2224 11/09/13 0212 11/09/13 0554 11/09/13 0938  BP: 128/34 130/33 138/37 132/48  Pulse: 57 58 59   Temp:  98.3 F (36.8 C) 98.5 F (36.9 C)   TempSrc:  Oral Oral   Resp:  17 17   Height:      Weight:   58.106 kg (128 lb 1.6 oz)   SpO2:  93% 97%    Weight change: -1.394 kg (-3 lb 1.2 oz)  Intake/Output Summary (Last 24 hours) at 11/09/13 1122 Last data filed at 11/09/13 0900  Gross per 24 hour  Intake   1163 ml  Output     10 ml  Net   1153 ml   Physical Exam:  BP 132/48  Pulse 59  Temp(Src) 98.5 F (36.9 C) (Oral)  Resp 17  Ht 5\' 4"  (1.626 m)  Wt 58.106 kg (128 lb 1.6 oz)  BMI 21.98 kg/m2  SpO2 97% Elderly WF NAD Up in teh recliner Lungs clear TDC right with dry dressing right Left upper AVF with multiple incisions, some erythema, no drainage and excellent bruit.  Hand is warm, good grip Woody edema bilat LE's unchanged  Labs: Basic Metabolic Panel:  Recent Labs Lab 11/03/13 0500 11/04/13 0416 11/04/13 0900 11/05/13 0718 11/06/13 0500 11/07/13 0524 11/07/13 1320 11/08/13 0540  NA 134* 138 139 137 137 136  --  133*  K 3.5 3.6 3.4* 3.2* 3.9 4.4  --  3.9  CL 97 101 104 101 100 100  --  96  CO2 26 29 25 28 28 26   --  27  GLUCOSE 115* 143* 115* 156* 184* 238*  --  298*  BUN 29* 15 14 11 19  25*  --  11  CREATININE 1.80* 1.50* 1.47* 1.52* 2.08* 2.29*  --  1.53*  CALCIUM 8.4 8.1* 7.2* 8.0* 8.3* 8.3* 8.1* 7.7*  PHOS 3.3 2.1*  --  2.0* 2.4 3.2  --  2.3    Recent Labs Lab 11/06/13 0500 11/07/13 0524 11/08/13 0540  ALBUMIN 2.3* 2.2* 2.1*    Recent Labs Lab 11/03/13 0500 11/04/13 0416 11/05/13 0715 11/07/13 0524  WBC 7.4 6.9 5.5 5.2  HGB 11.9* 11.5* 10.7* 10.9*  HCT  35.3* 34.7* 32.3* 32.2*  MCV 94.1 95.9 94.2 95.0  PLT 191 154 138* 159    Recent Labs Lab 11/08/13 1015 11/08/13 1300 11/08/13 1603 11/08/13 2112 11/09/13 0651  GLUCAP 160* 126* 139* 248* 313*     Recent Labs Lab 11/07/13 1320  IRON 39*  TIBC 169*   No results found. Medications:   . amLODipine  10 mg Oral Daily  . aspirin EC  81 mg Oral Daily  . atorvastatin  20 mg Oral q1800  . carvedilol  12.5 mg Oral BID WC  . cholecalciferol  2,000 Units Oral Daily  . cloNIDine  0.1 mg Oral BID  . darbepoetin (ARANESP) injection - DIALYSIS  25 mcg Intravenous Q Tue-HD  . enoxaparin (LOVENOX) injection  30 mg Subcutaneous Q24H  . [START ON 11/10/2013] ferric gluconate (FERRLECIT/NULECIT) IV  125 mg Intravenous Q M,W,F-HD  . hydrALAZINE  100 mg Oral TID  . insulin aspart  0-9 Units Subcutaneous TID WC  . insulin glargine  12  Units Subcutaneous QHS  . isosorbide mononitrate  20 mg Oral BID  . levothyroxine  75 mcg Oral QAC breakfast  . pantoprazole  40 mg Oral Daily  . sodium chloride  3 mL Intravenous Q12H    I  have reviewed scheduled and prn medications.  ASSESSMENT/RECOMMENDATIONS:  New ESRD Secondary to acute cardiorenal syndrome (acute exacerbation of diastolic heart failure).  IR has exchanged temp cath for [facility] on the right (12/2) VVS has placed perm access (12/3 left basilic transposition Dr. Hart Rochester) CLIPPED to [facility] MWF 2nd shift  EDW not established but will start with 58 kg and continue to adjust HD in the AM  Acute exacerbation of diastolic heart failure:  Refractory to medical management and started on dialysis.    Anemia of chronic kidney disease:  Hemoglobin currently over 10 Low dose ESA  Iron load X 5 for TSAT of 24  Hypertension:  Good control Reduce amlodipine to allow for room for ultrafiltration with HD  CKD-MBD  PTH was 158 in September; 134 now - no VDRA needed at this time No need for binders (phos low side)  DISPOSITION Discharge  plans unclear from my chart review.  Will plan for HD in the AM  Camille Bal, MD [facility] [phone] pager 11/09/2013, 11:22 AM

## 2013-11-09 NOTE — Progress Notes (Signed)
CBGs on 12/3: 273-160-126-139-248 mg/dl      21/3: 086-578 mg/dl  Unclear if patient received Lantus 12 units on 12/3 at HS due to unclear documentation.  AM CBG on 12/4 is 313 mg/dl.  Recommend increasing Lantus to 15-17 units daily if CBGs continue to be greater than 180 mg/dl.  Will continue to follow while in hospital.  Smith Mince RN BSN CDE

## 2013-11-09 NOTE — Progress Notes (Signed)
Physical Therapy Treatment Patient Details Name: Nicole [name] MRN: [id number] DOB: 1935-11-25 Today's Date: 11/09/2013 Time: 4098-1191 PT Time Calculation (min): 16 min  PT Assessment / Plan / Recommendation  History of Present Illness Pt admit with CHF.    PT Comments   Pt with excellent progression with mobility. Pt mod I to supervision with all activity today and encouraged use of Rw as pt not yet steady to return to use of cane with gait. Also recommend assist for entering/exiting home at this time with continued ambulation and HEP recommended. Pt already walked with nursing this am and denied further HEP due to fatigue.  Follow Up Recommendations  Home health PT;Supervision for mobility/OOB     Does the patient have the potential to tolerate intense rehabilitation     Barriers to Discharge        Equipment Recommendations       Recommendations for Other Services    Frequency     Progress towards PT Goals Progress towards PT goals: Progressing toward goals  Plan Discharge plan needs to be updated    Precautions / Restrictions Precautions Precautions: Fall Restrictions Weight Bearing Restrictions: No   Pertinent Vitals/Pain sats 94% on Ra with activity with brief drop to 89% after practicing stairs but immediately recovered to 92% with standing and cues for pursed lip breathing Tight left hip 1/10    Mobility  Bed Mobility Supine to Sit: 6: Modified independent (Device/Increase time);HOB flat Sitting - Scoot to Edge of Bed: 6: Modified independent (Device/Increase time) Details for Bed Mobility Assistance: increased time but able to perform without physical assistance. Transfers Sit to Stand: 6: Modified independent (Device/Increase time);From bed Stand to Sit: 5: Supervision;To chair/3-in-1;With armrests Details for Transfer Assistance: cues for control of descent Ambulation/Gait Ambulation/Gait Assistance: 5: Supervision Ambulation Distance (Feet): 250  Feet Assistive device: Rolling walker Ambulation/Gait Assistance Details: cues to use RW all the time as we attempted 10' with cane and pt with short unsteady steps, cues to step into RW Gait Pattern: Decreased stride length;Step-through pattern Gait velocity: decreased Stairs: Yes Stairs Assistance: 5: Supervision Stairs Assistance Details (indicate cue type and reason): cueing for technique and need to have assist to move RW in and out of home with step up Stair Management Technique: One rail Right Number of Stairs: 1    Exercises General Exercises - Lower Extremity Long Arc Quad: AROM;Both;15 reps;Seated Hip ABduction/ADduction: AROM;Seated;Both;20 reps   PT Diagnosis:    PT Problem List:   PT Treatment Interventions:     PT Goals (current goals can now be found in the care plan section)    Visit Information  Last PT Received On: 11/09/13 Assistance Needed: +1 History of Present Illness: Pt admit with CHF.     Subjective Data      Cognition  Cognition Arousal/Alertness: Awake/alert Behavior During Therapy: WFL for tasks assessed/performed Overall Cognitive Status: Within Functional Limits for tasks assessed    Balance     End of Session PT - End of Session Activity Tolerance: Patient tolerated treatment well Patient left: in chair;with nursing/sitter in room;with call bell/phone within reach Nurse Communication: Mobility status   GP     Toney Sang [facility] 11/09/2013, 10:44 AM Delaney Meigs, PT [phone]

## 2013-11-09 NOTE — Progress Notes (Signed)
  VASCULAR AND VEIN SURGERY PROGRESS NOTE  POST-OP HEMODIALYSIS ACCESS  Date of Surgery: 10/29/2013 - 11/08/2013 Surgeon: Moishe Spice): Pryor Ochoa, MD 1 Day Post-Op Left BASCILIC VEIN TRANSPOSITION   HPI: Nicole [name] is a 78 y.o. female who is 1 Day Post-Op creation/revision of left upper extremity Hemodialysis access. The patient denies symptoms of numbness, tingling, weakness; denies pain in the operative limb.   Significant Diagnostic Studies: CBC Lab Results  Component Value Date   WBC 5.2 11/07/2013   HGB 10.9* 11/07/2013   HCT 32.2* 11/07/2013   MCV 95.0 11/07/2013   PLT 159 11/07/2013    BMET    Component Value Date/Time   NA 133* 11/08/2013 0540   K 3.9 11/08/2013 0540   CL 96 11/08/2013 0540   CO2 27 11/08/2013 0540   GLUCOSE 298* 11/08/2013 0540   BUN 11 11/08/2013 0540   CREATININE 1.53* 11/08/2013 0540   CALCIUM 7.7* 11/08/2013 0540   CALCIUM 8.1* 11/07/2013 1320   GFRNONAA 32* 11/08/2013 0540   GFRAA 37* 11/08/2013 0540    COAG Lab Results  Component Value Date   INR 1.03 11/07/2013   INR 1.03 10/30/2013   INR 1.07 08/21/2013   No results found for this basename: PTT    Vital Signs  BP Readings from Last 3 Encounters:  11/09/13 132/48  11/09/13 132/48  11/09/13 132/48   Temp Readings from Last 3 Encounters:  11/09/13 98.5 F (36.9 C) Oral  11/09/13 98.5 F (36.9 C) Oral  11/09/13 98.5 F (36.9 C) Oral   SpO2 Readings from Last 3 Encounters:  11/09/13 97%  11/09/13 97%  11/09/13 97%   Pulse Readings from Last 3 Encounters:  11/09/13 59  11/09/13 59  11/09/13 59     Physical Examination  left upper arm Incision is healing well, skin color is normal , hand grip is 5/5, sensation in digits is intact;  There is a good thrill and good bruit in the left BVT.  Assessment/Plan Nicole [name] is a 78 y.o. year old female who presents s/p creation/revision of left upper extremity Hemodialysis access. Follow-up in 4 -6 weeks, our office  will arrange  The patient's access will be ready for use in 12 weeks.  Benton Tooker J 11/09/2013 9:52 AM

## 2013-11-09 NOTE — Progress Notes (Addendum)
Patient ID: Nicole [name], female   DOB: 25-Aug-1935, 78 y.o.   MRN: [id number]    Patient Name: Nicole [name] Date of Encounter: 11/09/2013  SUBJECTIVE: The patient feels much better, her breathing has improved and her legs are less tender and less swollen.    CURRENT MEDS . amLODipine  10 mg Oral Daily  . aspirin EC  81 mg Oral Daily  . atorvastatin  20 mg Oral q1800  . carvedilol  12.5 mg Oral BID WC  . cholecalciferol  2,000 Units Oral Daily  . cloNIDine  0.1 mg Oral BID  . darbepoetin (ARANESP) injection - DIALYSIS  25 mcg Intravenous Q Tue-HD  . enoxaparin (LOVENOX) injection  30 mg Subcutaneous Q24H  . [START ON 11/10/2013] ferric gluconate (FERRLECIT/NULECIT) IV  125 mg Intravenous Q M,W,F-HD  . hydrALAZINE  100 mg Oral TID  . insulin aspart  0-9 Units Subcutaneous TID WC  . insulin glargine  12 Units Subcutaneous QHS  . isosorbide mononitrate  20 mg Oral BID  . levothyroxine  75 mcg Oral QAC breakfast  . pantoprazole  40 mg Oral Daily  . sodium chloride  3 mL Intravenous Q12H    OBJECTIVE  Filed Vitals:   11/08/13 2224 11/09/13 0212 11/09/13 0554 11/09/13 0938  BP: 128/34 130/33 138/37 132/48  Pulse: 57 58 59   Temp:  98.3 F (36.8 C) 98.5 F (36.9 C)   TempSrc:  Oral Oral   Resp:  17 17   Height:      Weight:   128 lb 1.6 oz (58.106 kg)   SpO2:  93% 97%     Intake/Output Summary (Last 24 hours) at 11/09/13 1125 Last data filed at 11/09/13 0900  Gross per 24 hour  Intake   1163 ml  Output     10 ml  Net   1153 ml   Filed Weights   11/07/13 1255 11/08/13 0642 11/09/13 0554  Weight: 131 lb 2.8 oz (59.5 kg) 128 lb 6.4 oz (58.242 kg) 128 lb 1.6 oz (58.106 kg)   PHYSICAL EXAM  General: Pleasant, NAD. Lying in bed Neuro: Alert and oriented X 3. Moves all extremities spontaneously. Psych: Normal affect. HEENT:  Normal  Neck: Supple without bruits, JVD to jaw Lungs:  Resp regular and unlabored, CTA Heart: RRR no s3, s4, SEM  murmurs. Abdomen: Soft,  non-tender, distended, BS + x 4.  Extremities: No clubbing, cyanosis, minimal edema at the calves Radials 2+, DP/DT non-pulpable B/L. B/L upper extremity swelling  Accessory Clinical Findings  CBC  Recent Labs  11/07/13 0524  WBC 5.2  HGB 10.9*  HCT 32.2*  MCV 95.0  PLT 159   Basic Metabolic Panel  Recent Labs  11/07/13 0524 11/07/13 1320 11/08/13 0540  NA 136  --  133*  K 4.4  --  3.9  CL 100  --  96  CO2 26  --  27  GLUCOSE 238*  --  298*  BUN 25*  --  11  CREATININE 2.29*  --  1.53*  CALCIUM 8.3* 8.1* 7.7*  PHOS 3.2  --  2.3   BNP (last 3 results)  Recent Labs  02/13/13 1145 02/14/13 0540 10/29/13 1719  PROBNP 2031.0* 1703.0* 5412.0*   TTE 08/13/2013 Study Conclusions  - Left ventricle: The cavity size was normal. Systolic function was vigorous. The estimated ejection fraction was in the range of 65% to 70%. Wall motion was normal; there were no regional wall motion abnormalities. Features are consistent with  a pseudonormal left ventricular filling pattern, with concomitant abnormal relaxation and increased filling pressure (grade 2 diastolic dysfunction). - Mitral valve: Calcified annulus. Mild regurgitation. - Left atrium: The atrium was mildly dilated. - Right atrium: The atrium was mildly dilated. Impressions: - Compared to the prior study, there has been no significant interval change.  Left heart cath 05/08/2011   1. Well-known preserved left ventricular systolic function.  2. Calcified disease of the proximal circumflex coronary artery which  is a dominant vessel of uncertain severity with normal flow  characteristics in a patient with stable symptoms.   DISPOSITION: Based upon the findings of the flow wire, a continued  medical therapy would be recommended. The patient was at moderately  high risk. She is an insulin-dependent diabetic that would likely  require drug-eluting platform. She has a baseline hemoglobin of about  10 today, and  she has had a history of proliferative retinopathy as well  as some rectal bleeding on antiplatelet therapy. As she is stable  without current symptoms, a continued approach will be recommended.  Arturo Morton. Riley Kill, MD, [facility]   TELE:  Sinus bradycardia , 55-60 BPM 11/09/2013  ECG SR, normal ECG, 60-65 BPM   ASSESSMENT AND PLAN  78 yo woman with PMH of CKD stage IV, T2DM on insulin, PAD, hypertension, known CAD, acute on chronic HFpEF here with pulmonary edema.  1. Acute on chronic heart failure with preserved ejection fraction: Admitted for weight gain and DOE, found to be in pulmonary edema, she was hospitalized in September and followed every 2 weeks in the clinic since then. She has never reached comfortable state, always SOB, currently at rest NYHA IV. Failed attempt of diuresis with dopamin. Started on HD on 11/25, scheduled for 3 x a week as an oupatient. The fistula was placed on her left arm yesterday, until it matures her perm cath will be used for HD. The patient feels significantly better.   2. Acute on chronic kidney failure : Refractory to medical management and started on dialysis. Scheduled for 3 x a week as an oupatient. The fistula was placed on her left arm yesterday, until it matures her perm cath will be used for HD.  3. Hypertension:  Now controlled after initiation of her HD. Not resistant anymore.  4. CAD: continue medical management.   5. Peripheral arterial disease: B/L, stable currently.  6. Anemia of chronic kidney disease: Hemoglobin currently at goal-we'll start ESA with her declining hemoglobin. Recheck iron stores.   7. Right upper extremity swelling - There is no DVT or SVT noted in the right upper extremity.  8. IDDM - controlled  The patient will be discharged today or tomorrow based on vascular surgery decision. She will be followed in our clinic the next week.  Virgel Manifold, Rexene Edison  11/09/2013

## 2013-11-09 NOTE — Anesthesia Postprocedure Evaluation (Signed)
  Anesthesia Post-op Note  Patient: Nicole [name]  Procedure(s) Performed: Procedure(s): BASCILIC VEIN TRANSPOSITION (Left)  Patient Location: PACU  Anesthesia Type:General  Level of Consciousness: awake, alert  and oriented  Airway and Oxygen Therapy: Patient Spontanous Breathing  Post-op Pain: mild  Post-op Assessment: Post-op Vital signs reviewed, Patient's Cardiovascular Status Stable, Respiratory Function Stable, Patent Airway and Pain level controlled  Post-op Vital Signs: stable  Complications: No apparent anesthesia complications

## 2013-11-10 ENCOUNTER — Encounter (HOSPITAL_COMMUNITY): Payer: Self-pay | Admitting: Physician Assistant

## 2013-11-10 DIAGNOSIS — E039 Hypothyroidism, unspecified: Secondary | ICD-10-CM | POA: Insufficient documentation

## 2013-11-10 LAB — CBC
HCT: 30.2 % — ABNORMAL LOW (ref 36.0–46.0)
Hemoglobin: 10.4 g/dL — ABNORMAL LOW (ref 12.0–15.0)
MCH: 32.1 pg (ref 26.0–34.0)
MCV: 93.2 fL (ref 78.0–100.0)
RBC: 3.24 MIL/uL — ABNORMAL LOW (ref 3.87–5.11)

## 2013-11-10 LAB — RENAL FUNCTION PANEL
Albumin: 2.1 g/dL — ABNORMAL LOW (ref 3.5–5.2)
BUN: 30 mg/dL — ABNORMAL HIGH (ref 6–23)
CO2: 27 mEq/L (ref 19–32)
Calcium: 8.1 mg/dL — ABNORMAL LOW (ref 8.4–10.5)
Creatinine, Ser: 3.18 mg/dL — ABNORMAL HIGH (ref 0.50–1.10)
GFR calc Af Amer: 15 mL/min — ABNORMAL LOW (ref >90)
Glucose, Bld: 114 mg/dL — ABNORMAL HIGH (ref 70–99)
Potassium: 4.4 mEq/L (ref 3.5–5.1)

## 2013-11-10 LAB — GLUCOSE, CAPILLARY
Glucose-Capillary: 117 mg/dL — ABNORMAL HIGH (ref 70–99)
Glucose-Capillary: 91 mg/dL (ref 70–99)
Glucose-Capillary: 87 mg/dL (ref 70–99)

## 2013-11-10 MED ORDER — CLONIDINE HCL 0.1 MG PO TABS
0.1000 mg | ORAL_TABLET | Freq: Every day | ORAL | Status: DC
  Filled 2013-11-10: qty 1

## 2013-11-10 MED ORDER — HYDRALAZINE HCL 100 MG PO TABS: 100.0000 mg | ORAL_TABLET | Freq: Three times a day (TID) | ORAL | Status: DC

## 2013-11-10 MED ORDER — INSULIN GLARGINE 100 UNIT/ML ~~LOC~~ SOLN: 12.0000 [IU] | Freq: Every day | SUBCUTANEOUS | Status: DC

## 2013-11-10 MED ORDER — HEPARIN SODIUM (PORCINE) 1000 UNIT/ML IJ SOLN
1100.0000 [IU] | Freq: Once | INTRAMUSCULAR | Status: AC
  Administered 2013-11-10: 1100 [IU] via INTRAVENOUS

## 2013-11-10 NOTE — Discharge Summary (Signed)
Discharge Summary   Patient ID: Nicole [name] MRN: [id number], DOB/AGE: 12/23/1934 78 y.o. Admit date: 10/29/2013 D/C date:     11/10/2013  Primary Care Provider: Michele Mcalpine, MD Primary Cardiologist: Delton See  Primary Discharge Diagnoses:  1. Acute on chronic diastolic CHF with pulmonary edema 2. New ESRD on HD (progression from CKD) 3. HTN 4. Anemia of chronic disease 5. RUE swelling without DVT or SVT 6. IDDM 7. CAD - prior history: Prior PCI in 1998 x 2; s/p cath in 2001, negative Myoview in 2008 & 2011, s/p cath in June 2012 with calcified disease of the proximal LCX with normal flow reserve. She has been managed medically due to her other morbidities  8. PAD, stable - prior angiography showing RSFA stenosis, left anterior tibial and left posterior tibial stenoses   Secondary Discharge Diagnoses:  1. Hypothyroidism 2. Retinopathy  3. Cerebrovascular disease 4. Hypercholesterolemia 5. Hiatal hernia  6. Degenerative joint disease   7. Osteoporosis   8. Vitamin D deficiency   Hospital Course: Nicole [name] is a 78 y/o F with history of CAD s/p prior PCI, chronic diastolic CHF, peripheral vascular disease, hypothyroidism, CKD who presented to [facility] 10/29/2013 with complaints of SOB. She was recently seen in the outpatient setting by Dr. Delton See with further titration of Lasix. However, the patient's weight had climbed to 147lbs with dry weight of 133lbs. She called into the answering service and was advised to proceed to the ER where she was noted to be in acute on chronic diastolic CHF with pulmonary edema on CXR and elevated pBNP. She also reported fullness in her abdomen and upper legs. She endorsed medication compliance, but denied recent travel/fever/chills or sick contacts. She was given 80mg  IV Lasix in the ER without significant UOP. She was then bolused with 120mg  IV Lasix then placed on a drip. Troponins were negative. The CHF team was consulted due to marked  volume overload. Even with an increase in lasix drip and addition of metolazone her UOP was not adequate. She underwent RHC on 10/31/13 demonstrating: Findings:  RA = 16  RV = 57/13/17  PA = 49/21 (35)  PCW = 35 (v=45)  Fick cardiac output/index = 6.6/4.3  FA sat = 91%  PA sat = 72%, 73%  Assessment:  1. Markedly elevated biventricular pressures  2. Normal cardiac output Dr. Gala Romney suspected the major issue was that she was nearing ESRD. The patient was started on renal-dose dopamine to facilitate diuresis, however, it was ineffective. Nephrology was consulted for consideration of dialysis. Dr. Allena Katz felt her ARF on CKD was secondary to acute cardiorenal syndrome (acute exacerbation of diastolic heart failure) and agreed with necessity of trying dialysis. A central venous trialysis catheter was inserted. She underwent her first HD session on 11/01/13 after which CVP was 12-13. Renal gave her bolus of IV Lasix with sluggish UOP (-900cc) and planned for dialysis again on 11/03/13 and then again on 11/04/13 for fluid removal. Along the way, her antihypertensive meds were also adjusted for resistant hypertension. IR evaluated the patient and tunneled HD catheter was placed on 12/1. She did have some RUE swelling and DVT/SVT was ruled out by venous duplex. HD was again performed on 12/2 and renal assisted with the process in setting up outpatient dialysis MWF at [facility]. She was also given darbepoeitin and iron per nephrology this admission. Vascular surgery was consulted for permanent HD access and they carried out vein mapping. She underwent LUE basilic transposition by vascular  on 12/3 with recommendation that the access will be ready for use in 12 weeks. Renal does not feel any VDRA or binders are needed at this time. The patient underwent dialysis in the hospital today. Today she is feeling better. She is off oxygen and not dyspneic. Her BP has come down some with dialysis initiation, so amlopidine  was stopped and clonidine was decreased to QHS. Dr. Delton See (cardiology) and Dr. Eliott Nine (nephro) have has seen and examined the patient today and feels she is stable for discharge. VVS has left post-access implant care instructions for her and will also be arranging followup in their office. Per Dr. Eliott Nine, the patient may start HD at [facility] on Monday, 2nd shift MWF. PT has seen the patient and recommended HHPT; an order was placed at discharge for this. HHRN was also arranged by the CHF team.  The patient was also instructed to monitor CBG at home. Per diabetes coordinator recommendation, Lantus dose was decreased this adm due to hypoglycemia.  Discharge Vitals: Blood pressure 156/56, pulse 66, temperature 98.1 F (36.7 C), temperature source Oral, resp. rate 14, height 5\' 4"  (1.626 m), weight 129 lb 10.1 oz (58.8 kg), SpO2 96.00%.  Labs: Lab Results  Component Value Date   WBC 6.8 11/10/2013   HGB 10.4* 11/10/2013   HCT 30.2* 11/10/2013   MCV 93.2 11/10/2013   PLT 138* 11/10/2013     Recent Labs Lab 11/10/13 0747  NA 127*  K 4.4  CL 92*  CO2 27  BUN 30*  CREATININE 3.18*  CALCIUM 8.1*  GLUCOSE 114*    Lab Results  Component Value Date   CHOL 270* 04/27/2013   HDL 52.70 04/27/2013   LDLCALC 110* 09/23/2011   TRIG 184.0* 04/27/2013    Diagnostic Studies/Procedures   RHC, vein mapping, tunneled cath, LUE basilic transposition - please see EPIC for full reports  Dg Chest 2 View 10/29/2013   CLINICAL DATA:  Chest pain.  Shortness of breath.  Fluid overload.  EXAM: CHEST  2 VIEW  COMPARISON:  08/12/2013  FINDINGS: Mild cardiomegaly is stable. Increased diffuse interstitial infiltrates are seen, consistent with interstitial edema. Small bilateral pleural effusions of also increased in size since previous study.  IMPRESSION: Worsening congestive heart failure with increased small bilateral pleural effusions.   Electronically Signed   By: Myles Rosenthal M.D.   On: 10/29/2013 18:16   Ir  Fluoro Guide Cv Line Right  11/07/2013   CLINICAL DATA:  End-stage renal disease, no current long-term access  EXAM: ULTRASOUND GUIDANCE FOR VASCULAR ACCESS  Right internal jugular PERMANENT HEMODIALYSIS CATHETER  Date:  12/2/201412/01/2013 10:31 AM  Radiologist:  Judie Petit. Ruel Favors, MD  Guidance:  Ultrasound and fluoroscopic  MEDICATIONS AND MEDICAL HISTORY: 2 g Ancef administered within 1 hr of the procedure, 1 mg Versed said, 25 mcg fentanyl  ANESTHESIA/SEDATION: 15 min  CONTRAST:  None.  FLUOROSCOPY TIME:  24 seconds  PROCEDURE: Informed consent was obtained from the patient following explanation of the procedure, risks, benefits and alternatives. The patient understands, agrees and consents for the procedure. All questions were addressed. A time out was performed.  Maximal barrier sterile technique utilized including caps, mask, sterile gowns, sterile gloves, large sterile drape, hand hygiene, and 2% chlorhexidine scrub.  Under sterile conditions and local anesthesia, right internal jugular micropuncture venous access was performed with ultrasound. Images were obtained for documentation. A guide wire was inserted followed by a transitional dilator. Next, a 0.035 guidewire was advanced into the IVC with  a 5-French catheter. Measurements were obtained from the right venotomy site to the proximal right atrium. In the right infraclavicular chest, a subcutaneous tunnel was created under sterile conditions and local anesthesia. 1% lidocaine with epinephrine was utilized for this. The 23 cm tip to cuff hemosplit catheter was tunneled subcutaneously to the venotomy site and inserted into the SVC/RA junction through a valved peel-away sheath. Position was confirmed with fluoroscopy. Images were obtained for documentation. Blood was aspirated from the catheter followed by saline and heparin flushes. The appropriate volume and strength of heparin was instilled in each lumen. Caps were applied. The catheter was secured at the  tunnel site with Gelfoam and a pursestring suture. The venotomy site was closed with subcuticular Vicryl suture. Dermabond was applied to the small right neck incision. A dry sterile dressing was applied. The catheter is ready for use. No immediate complications.  COMPLICATIONS: No immediate  IMPRESSION: Ultrasound and fluoroscopically guided right internal jugular tunneled hemodialysis catheter (23 cm tip to cuff hemosplit catheter).   Electronically Signed   By: Ruel Favors M.D.   On: 11/07/2013 11:30   Ir US Guide Vasc Access Right  11/07/2013   CLINICAL DATA:  End-stage renal disease, no current long-term access  EXAM: ULTRASOUND GUIDANCE FOR VASCULAR ACCESS  Right internal jugular PERMANENT HEMODIALYSIS CATHETER  Date:  12/2/201412/01/2013 10:31 AM  Radiologist:  Judie Petit. Ruel Favors, MD  Guidance:  Ultrasound and fluoroscopic  MEDICATIONS AND MEDICAL HISTORY: 2 g Ancef administered within 1 hr of the procedure, 1 mg Versed said, 25 mcg fentanyl  ANESTHESIA/SEDATION: 15 min  CONTRAST:  None.  FLUOROSCOPY TIME:  24 seconds  PROCEDURE: Informed consent was obtained from the patient following explanation of the procedure, risks, benefits and alternatives. The patient understands, agrees and consents for the procedure. All questions were addressed. A time out was performed.  Maximal barrier sterile technique utilized including caps, mask, sterile gowns, sterile gloves, large sterile drape, hand hygiene, and 2% chlorhexidine scrub.  Under sterile conditions and local anesthesia, right internal jugular micropuncture venous access was performed with ultrasound. Images were obtained for documentation. A guide wire was inserted followed by a transitional dilator. Next, a 0.035 guidewire was advanced into the IVC with a 5-French catheter. Measurements were obtained from the right venotomy site to the proximal right atrium. In the right infraclavicular chest, a subcutaneous tunnel was created under sterile conditions and  local anesthesia. 1% lidocaine with epinephrine was utilized for this. The 23 cm tip to cuff hemosplit catheter was tunneled subcutaneously to the venotomy site and inserted into the SVC/RA junction through a valved peel-away sheath. Position was confirmed with fluoroscopy. Images were obtained for documentation. Blood was aspirated from the catheter followed by saline and heparin flushes. The appropriate volume and strength of heparin was instilled in each lumen. Caps were applied. The catheter was secured at the tunnel site with Gelfoam and a pursestring suture. The venotomy site was closed with subcuticular Vicryl suture. Dermabond was applied to the small right neck incision. A dry sterile dressing was applied. The catheter is ready for use. No immediate complications.  COMPLICATIONS: No immediate  IMPRESSION: Ultrasound and fluoroscopically guided right internal jugular tunneled hemodialysis catheter (23 cm tip to cuff hemosplit catheter).   Electronically Signed   By: Ruel Favors M.D.   On: 11/07/2013 11:30   Dg Chest Port 1 View  11/01/2013   CLINICAL DATA:  Follow-up pleural effusion  EXAM: PORTABLE CHEST - 1 VIEW  COMPARISON:  10/29/2013  FINDINGS: Cardiomediastinal silhouette is stable. There is bilateral small pleural effusion with bilateral basilar atelectasis or infiltrate. Central mild vascular congestion and mild interstitial prominence suspicious for pulmonary edema. Atherosclerotic calcifications of thoracic aorta again noted. Dual lumen right IJ catheter with tip in the region of SVC right atrium junction.  IMPRESSION: There is bilateral small pleural effusion with bilateral basilar atelectasis or infiltrate. Central mild vascular congestion and mild interstitial prominence suspicious for pulmonary edema. Atherosclerotic calcifications of thoracic aorta again noted. Dual lumen right IJ catheter with tip in the region of SVC right atrium junction.   Electronically Signed   By: Natasha Mead M.D.    On: 11/01/2013 15:52    Discharge Medications     Medication List    STOP taking these medications       amLODipine 10 MG tablet  Commonly known as:  NORVASC     furosemide 40 MG tablet  Commonly known as:  LASIX      TAKE these medications       acetaminophen 500 MG tablet  Commonly known as:  TYLENOL  Take 500 mg by mouth every 6 (six) hours as needed for pain.     aspirin EC 81 MG tablet  Take 81 mg by mouth daily.     atorvastatin 20 MG tablet  Commonly known as:  LIPITOR  Take 1 tablet (20 mg total) by mouth daily at 6 PM.     carvedilol 12.5 MG tablet  Commonly known as:  COREG  Take 1 tablet (12.5 mg total) by mouth 2 (two) times daily with a meal.     cloNIDine 0.1 MG tablet  Commonly known as:  CATAPRES  Take 0.1 mg by mouth daily at 10 pm.     hydrALAZINE 100 MG tablet  Commonly known as:  APRESOLINE  Take 1 tablet (100 mg total) by mouth 3 (three) times daily.     insulin glargine 100 UNIT/ML injection  Commonly known as:  LANTUS  Inject 0.12 mLs (12 Units total) into the skin at bedtime.     insulin lispro 100 UNIT/ML injection  Commonly known as:  HUMALOG  Inject 0-10 Units into the skin 3 (three) times daily before meals. Home sliding scale     isosorbide mononitrate 20 MG tablet  Commonly known as:  ISMO,MONOKET  Take 1 tablet (20 mg total) by mouth 2 (two) times daily.     levothyroxine 75 MCG tablet  Commonly known as:  SYNTHROID  Take 1 tablet (75 mcg total) by mouth daily.     nitroGLYCERIN 0.4 MG SL tablet  Commonly known as:  NITROSTAT  Place 0.4 mg under the tongue every 5 (five) minutes as needed for chest pain.     pantoprazole 40 MG tablet  Commonly known as:  PROTONIX  Take 1 tablet (40 mg total) by mouth daily.     traMADol 50 MG tablet  Commonly known as:  ULTRAM  Take 1 tablet (50 mg total) by mouth every 6 (six) hours as needed for pain.     Vitamin D 1000 UNITS capsule  Take 2,000 Units by mouth daily.         Disposition   The patient will be discharged in stable condition to home. Discharge Orders   Future Appointments Provider Department Dept Phone   11/15/2013 12:00 PM [facility] Room [facility] [phone]   11/22/2013 12:00 PM [facility] Room [facility] [phone]   11/24/2013 3:15 PM  Lars Masson, MD [facility] [place] Office [phone]   11/28/2013 12:00 PM [facility] Room [facility] [phone]   12/05/2013 12:00 PM [facility] Room [facility] [phone]   12/12/2013 2:30 PM [facility] [place] [facility] [address] [phone]   12/12/2013 3:30 PM Pryor Ochoa, MD [facility] [phone]   Future Orders Complete By Expires   Diet - low sodium heart healthy  As directed    Discharge instructions  As directed    Comments:     See attached page at the end of these discharge instructions for dialysis access care.  You had both high and low blood sugar readings while in the hospital. Please monitor your blood sugar regularly and contact your primary care doctor to discuss a plan for adjusting your insulin.   Increase activity slowly  As directed      Follow-up Information   Follow up with [facility]. (RN)    Contact information:   [address] [place] [address] [phone]       Follow up with Josephina Gip, MD In 4 weeks. (office will arrange-sent)    Specialty:  Vascular Surgery   Contact information:   [address] [place] [address] [phone]       Follow up with [facility]. (First outpatient session on Monday 11/13/13, 2nd shift)    Contact information:   [address] [place], [place] [address] [phone]      Schedule an appointment as soon as possible for a visit with [facility].   Contact information:   [address] [place] [address] [phone]      Follow up with  Tobias Alexander, Rexene Edison, MD. (11/24/13 at 3:15pm)    Specialty:  Cardiology   Contact information:   [address] [place] [address] [phone]         Duration of Discharge Encounter: Greater than 30 minutes including physician and PA time.  Signed, Ronie Spies PA-C 11/10/2013, 1:27 PM  Tobias Alexander, H 11/10/2013

## 2013-11-10 NOTE — Progress Notes (Addendum)
Subjective:  Seen on dialysis today Other than feeling restless, no complaints Access placed 12/3 - LUE basilic transposition Arm "sore and stiff" but no symptoms of steal and feels better than yesterday Has an outpt spot at [facility] on a MWF schedule, 2nd shift  Objective Vital signs in last 24 hours: Filed Vitals:   11/10/13 0800 11/10/13 0830 11/10/13 0900 11/10/13 0930  BP: 133/49 123/47 145/45 133/50  Pulse: 58 61 65 61  Temp:      TempSrc:      Resp:      Height:      Weight:      SpO2:       Weight change: -0.306 kg (-10.8 oz)  Intake/Output Summary (Last 24 hours) at 11/10/13 0947 Last data filed at 11/10/13 0900  Gross per 24 hour  Intake    480 ml  Output    101 ml  Net    379 ml   Physical Exam:  BP 133/50  Pulse 61  Temp(Src) 98.4 F (36.9 C) (Oral)  Resp 12  Ht 5\' 4"  (1.626 m)  Wt 59.3 kg (130 lb 11.7 oz)  BMI 22.43 kg/m2  SpO2 95% Elderly WF NAD In dialysis examined in bed Lungs clear TDC right with dry dressing right Left upper AVF with multiple incisions, some erythema, no drainage and excellent bruit.  Hand is warm, good grip Abdomen soft and non-tender Mild woody edema bilat LE's    Recent Labs Lab 11/04/13 0416 11/04/13 0900 11/05/13 0718 11/06/13 0500 11/07/13 0524 11/07/13 1320 11/08/13 0540 11/10/13 0747  NA 138 139 137 137 136  --  133* 127*  K 3.6 3.4* 3.2* 3.9 4.4  --  3.9 4.4  CL 101 104 101 100 100  --  96 92*  CO2 29 25 28 28 26   --  27 27  GLUCOSE 143* 115* 156* 184* 238*  --  298* 114*  BUN 15 14 11 19  25*  --  11 30*  CREATININE 1.50* 1.47* 1.52* 2.08* 2.29*  --  1.53* 3.18*  CALCIUM 8.1* 7.2* 8.0* 8.3* 8.3* 8.1* 7.7* 8.1*  PHOS 2.1*  --  2.0* 2.4 3.2  --  2.3 4.3    Recent Labs Lab 11/07/13 0524 11/08/13 0540 11/10/13 0747  ALBUMIN 2.2* 2.1* 2.1*    Recent Labs Lab 11/04/13 0416 11/05/13 0715 11/07/13 0524 11/10/13 0747  WBC 6.9 5.5 5.2 6.8  HGB 11.5* 10.7* 10.9* 10.4*  HCT 34.7* 32.3* 32.2* 30.2*  MCV  95.9 94.2 95.0 93.2  PLT 154 138* 159 138*    Recent Labs Lab 11/09/13 0651 11/09/13 1134 11/09/13 1655 11/09/13 2046 11/10/13 0656  GLUCAP 313* 304* 246* 193* 117*     Recent Labs Lab 11/07/13 1320  IRON 39*  TIBC 169*   No results found. Medications:   . amLODipine  5 mg Oral Daily  . aspirin EC  81 mg Oral Daily  . atorvastatin  20 mg Oral q1800  . carvedilol  12.5 mg Oral BID WC  . cholecalciferol  2,000 Units Oral Daily  . cloNIDine  0.1 mg Oral BID  . darbepoetin (ARANESP) injection - DIALYSIS  25 mcg Intravenous Q Tue-HD  . enoxaparin (LOVENOX) injection  30 mg Subcutaneous Q24H  . ferric gluconate (FERRLECIT/NULECIT) IV  125 mg Intravenous Q M,W,F-HD  . hydrALAZINE  100 mg Oral TID  . insulin aspart  0-9 Units Subcutaneous TID WC  . insulin glargine  12 Units Subcutaneous QHS  . isosorbide mononitrate  20 mg Oral BID  . levothyroxine  75 mcg Oral QAC breakfast  . pantoprazole  40 mg Oral Daily  . sodium chloride  3 mL Intravenous Q12H    I  have reviewed scheduled and prn medications.  ASSESSMENT/RECOMMENDATIONS:  New ESRD Secondary to acute cardiorenal syndrome (acute exacerbation of diastolic heart failure).  IR exchanged temp cath for [facility] on the right (12/2) VVS placed perm access (12/3 left basilic transposition Dr. Hart [place]) CLIPPED to [facility] MWF 2nd shift  EDW not established but will start with 58 kg and continue to adjust HD now  Acute exacerbation of diastolic heart failure:  Refractory to medical management and started on dialysis Now stable, off O2 and not dyspneic.    Anemia of chronic kidney disease:  Hemoglobin currently over 10 Low dose ESA darbe 25 will be changed to EPO at discharge Iron load X 5 for TSAT of 24  Hypertension:  Good control Reduced amlodipine to allow for room for ultrafiltration with HD but bp soft in HD - stop amlodipine and reduce clonidine to QHS  CKD-MBD  PTH was 158 in September; 134 now - no VDRA  needed at this time No need for binders (phos low side)  DISPOSITION Discharge plans unclear from my chart review.   SHOULD be able to go home this weekend and start HD at [facility] on Monday; not above medication changes  Camille Bal, MD [facility] [phone] pager 11/10/2013, 9:47 AM

## 2013-11-10 NOTE — Progress Notes (Signed)
Patient ID: NATHASHA FIORILLO, female   DOB: 1935/05/04, 78 y.o.   MRN: [id number]     Patient Name: Nicole [name] Date of Encounter: 11/10/2013  SUBJECTIVE: The patient feels much better, her breathing has improved and her legs are less tender and less swollen.  She complains of mild weakness as she spent a lot of time in bed in last couple of days.  CURRENT MEDS . aspirin EC  81 mg Oral Daily  . atorvastatin  20 mg Oral q1800  . carvedilol  12.5 mg Oral BID WC  . cholecalciferol  2,000 Units Oral Daily  . cloNIDine  0.1 mg Oral QHS  . darbepoetin (ARANESP) injection - DIALYSIS  25 mcg Intravenous Q Tue-HD  . enoxaparin (LOVENOX) injection  30 mg Subcutaneous Q24H  . ferric gluconate (FERRLECIT/NULECIT) IV  125 mg Intravenous Q M,W,F-HD  . hydrALAZINE  100 mg Oral TID  . insulin aspart  0-9 Units Subcutaneous TID WC  . insulin glargine  12 Units Subcutaneous QHS  . isosorbide mononitrate  20 mg Oral BID  . levothyroxine  75 mcg Oral QAC breakfast  . pantoprazole  40 mg Oral Daily  . sodium chloride  3 mL Intravenous Q12H    OBJECTIVE  Filed Vitals:   11/10/13 1000 11/10/13 1030 11/10/13 1100 11/10/13 1110  BP: 143/52 147/53 156/53 156/56  Pulse: 62 63 67 66  Temp:    98.1 F (36.7 C)  TempSrc:    Oral  Resp:    14  Height:      Weight:    129 lb 10.1 oz (58.8 kg)  SpO2:    96%    Intake/Output Summary (Last 24 hours) at 11/10/13 1327 Last data filed at 11/10/13 1110  Gross per 24 hour  Intake    240 ml  Output   1157 ml  Net   -917 ml   Filed Weights   11/10/13 0448 11/10/13 0718 11/10/13 1110  Weight: 127 lb 6.8 oz (57.8 kg) 130 lb 11.7 oz (59.3 kg) 129 lb 10.1 oz (58.8 kg)   PHYSICAL EXAM  General: Pleasant, NAD. Lying in bed Neuro: Alert and oriented X 3. Moves all extremities spontaneously. Psych: Normal affect. HEENT:  Normal  Neck: Supple without bruits, JVD to jaw Lungs:  Resp regular and unlabored, CTA Heart: RRR no s3, s4, SEM   murmurs. Abdomen: Soft, non-tender, distended, BS + x 4.  Extremities: No clubbing, cyanosis, no edema, Radials 2+, DP/DT non-pulpable B/L. B/L upper extremity swelling  Accessory Clinical Findings  CBC  Recent Labs  11/10/13 0747  WBC 6.8  HGB 10.4*  HCT 30.2*  MCV 93.2  PLT 138*   Basic Metabolic Panel  Recent Labs  11/08/13 0540 11/10/13 0747  NA 133* 127*  K 3.9 4.4  CL 96 92*  CO2 27 27  GLUCOSE 298* 114*  BUN 11 30*  CREATININE 1.53* 3.18*  CALCIUM 7.7* 8.1*  PHOS 2.3 4.3   BNP (last 3 results)  Recent Labs  02/13/13 1145 02/14/13 0540 10/29/13 1719  PROBNP 2031.0* 1703.0* 5412.0*   TTE 08/13/2013 Study Conclusions  - Left ventricle: The cavity size was normal. Systolic function was vigorous. The estimated ejection fraction was in the range of 65% to 70%. Wall motion was normal; there were no regional wall motion abnormalities. Features are consistent with a pseudonormal left ventricular filling pattern, with concomitant abnormal relaxation and increased filling pressure (grade 2 diastolic dysfunction). - Mitral valve: Calcified annulus. Mild regurgitation. -  Left atrium: The atrium was mildly dilated. - Right atrium: The atrium was mildly dilated. Impressions: - Compared to the prior study, there has been no significant interval change.  Left heart cath 05/08/2011   1. Well-known preserved left ventricular systolic function.  2. Calcified disease of the proximal circumflex coronary artery which  is a dominant vessel of uncertain severity with normal flow  characteristics in a patient with stable symptoms.   DISPOSITION: Based upon the findings of the flow wire, a continued  medical therapy would be recommended. The patient was at moderately  high risk. She is an insulin-dependent diabetic that would likely  require drug-eluting platform. She has a baseline hemoglobin of about  10 today, and she has had a history of proliferative retinopathy  as well  as some rectal bleeding on antiplatelet therapy. As she is stable  without current symptoms, a continued approach will be recommended.  Arturo Morton. Riley Kill, MD, [facility]   TELE:  Sinus bradycardia , 55-60 BPM 11/10/2013  ECG SR, normal ECG, 60-65 BPM   ASSESSMENT AND PLAN  78 yo woman with PMH of CKD stage IV, T2DM on insulin, PAD, hypertension, known CAD, acute on chronic HFpEF here with pulmonary edema.  1. Acute on chronic heart failure with preserved ejection fraction: Admitted for weight gain and DOE, found to be in pulmonary edema, she was hospitalized in September and followed every 2 weeks in the clinic since then. She has never reached comfortable state, always SOB, currently at rest NYHA IV. Failed attempt of diuresis with dopamin. Started on HD on 11/25, scheduled for 3 x a week as an oupatient. The fistula was placed on her left arm yesterday, until it matures her perm cath will be used for HD. The patient feels significantly better.   2. Acute on chronic kidney failure : Refractory to medical management and started on dialysis. Scheduled for 3 x a week as an oupatient. The fistula was placed on her left arm yesterday, until it matures her perm cath will be used for HD.  3. Hypertension:  Now hypotensive after dialyses. Nephrology discontinued amlodipine.    4. CAD: continue medical management.   5. Peripheral arterial disease: B/L, stable currently.  6. Anemia of chronic kidney disease: Hemoglobin currently at goal-we'll start ESA with her declining hemoglobin. Recheck iron stores.   7. Right upper extremity swelling - There is no DVT or SVT noted in the right upper extremity.  8. IDDM - controlled  The patient will be discharged today, we will see her in our clinic the next week. She is scheduled for HD three times a week, Home PT is arranged. We will ask SW about possible transportation arrangements for HD as she lives alone.   Virgel Manifold, Rexene Edison   11/10/2013

## 2013-11-10 NOTE — Procedures (Signed)
I have personally attended this patient's dialysis session.   Time 3 1/2 hours.  Using TDC bfr 400  Bath changed to 2K BP soft (have stopped amlodipine and reduced clonidine to QHS.    Nicole [name]

## 2013-11-10 NOTE — Progress Notes (Signed)
All d/c instructions explained as given to pt verbalized understanding.  D/c off floor via w/c  Amanda Pea, RN.

## 2013-11-10 NOTE — Progress Notes (Signed)
Noted ferric gluconate not given in HD.  Notified V[place], nurse in HD.  She said will notify outpatient HD.  Also informed her that pt will be d/c today.  Amanda Pea, Charity fundraiser.

## 2013-11-10 NOTE — Progress Notes (Signed)
Informed Dr. Eliott Nine that pt did not received  her ferri gluconate iv with HD today and if wanted to be given now.  MD instructed not to give it.  Pt made ware.   Amanda Pea, Charity fundraiser.

## 2013-11-15 ENCOUNTER — Encounter (HOSPITAL_COMMUNITY): Admission: RE | Admit: 2013-11-15 | Payer: Medicare Other | Source: Ambulatory Visit

## 2013-11-22 ENCOUNTER — Encounter (HOSPITAL_COMMUNITY): Payer: Medicare Other

## 2013-11-24 ENCOUNTER — Encounter: Payer: Medicare Other | Admitting: Cardiology

## 2013-11-28 ENCOUNTER — Encounter (HOSPITAL_COMMUNITY): Payer: Medicare Other

## 2013-12-01 ENCOUNTER — Other Ambulatory Visit: Payer: Self-pay | Admitting: *Deleted

## 2013-12-01 DIAGNOSIS — N186 End stage renal disease: Secondary | ICD-10-CM

## 2013-12-01 DIAGNOSIS — Z4931 Encounter for adequacy testing for hemodialysis: Secondary | ICD-10-CM

## 2013-12-05 ENCOUNTER — Encounter: Payer: Self-pay | Admitting: Cardiology

## 2013-12-05 ENCOUNTER — Encounter: Payer: Medicare Other | Admitting: Cardiology

## 2013-12-05 ENCOUNTER — Encounter (HOSPITAL_COMMUNITY): Payer: Medicare Other

## 2013-12-06 ENCOUNTER — Encounter: Payer: Self-pay | Admitting: Cardiology

## 2013-12-11 ENCOUNTER — Encounter: Payer: Self-pay | Admitting: Vascular Surgery

## 2013-12-12 ENCOUNTER — Encounter: Payer: Medicare Other | Admitting: Vascular Surgery

## 2013-12-12 ENCOUNTER — Other Ambulatory Visit (HOSPITAL_COMMUNITY): Payer: Medicare Other

## 2013-12-18 ENCOUNTER — Encounter: Payer: Self-pay | Admitting: Vascular Surgery

## 2013-12-19 ENCOUNTER — Other Ambulatory Visit (HOSPITAL_COMMUNITY): Payer: Medicare Other

## 2013-12-19 ENCOUNTER — Encounter: Payer: Medicare Other | Admitting: Vascular Surgery

## 2013-12-21 ENCOUNTER — Emergency Department (HOSPITAL_COMMUNITY): Payer: Medicare Other

## 2013-12-21 ENCOUNTER — Encounter (HOSPITAL_COMMUNITY): Payer: Self-pay | Admitting: Emergency Medicine

## 2013-12-21 ENCOUNTER — Emergency Department (HOSPITAL_COMMUNITY)
Admission: EM | Admit: 2013-12-21 | Discharge: 2013-12-21 | Disposition: A | Discharge status: Home / Self Care | Payer: Medicare Other | Attending: Emergency Medicine | Admitting: Emergency Medicine

## 2013-12-21 DIAGNOSIS — S5290XA Unspecified fracture of unspecified forearm, initial encounter for closed fracture: Secondary | ICD-10-CM

## 2013-12-21 DIAGNOSIS — S02400A Malar fracture unspecified, initial encounter for closed fracture: Secondary | ICD-10-CM | POA: Insufficient documentation

## 2013-12-21 DIAGNOSIS — Z87891 Personal history of nicotine dependence: Secondary | ICD-10-CM | POA: Insufficient documentation

## 2013-12-21 DIAGNOSIS — Z8719 Personal history of other diseases of the digestive system: Secondary | ICD-10-CM | POA: Insufficient documentation

## 2013-12-21 DIAGNOSIS — D638 Anemia in other chronic diseases classified elsewhere: Secondary | ICD-10-CM | POA: Insufficient documentation

## 2013-12-21 DIAGNOSIS — M1990 Unspecified osteoarthritis, unspecified site: Secondary | ICD-10-CM | POA: Insufficient documentation

## 2013-12-21 DIAGNOSIS — I2510 Atherosclerotic heart disease of native coronary artery without angina pectoris: Secondary | ICD-10-CM | POA: Insufficient documentation

## 2013-12-21 DIAGNOSIS — S02401A Maxillary fracture, unspecified, initial encounter for closed fracture: Secondary | ICD-10-CM | POA: Insufficient documentation

## 2013-12-21 DIAGNOSIS — Y939 Activity, unspecified: Secondary | ICD-10-CM | POA: Insufficient documentation

## 2013-12-21 DIAGNOSIS — I10 Essential (primary) hypertension: Secondary | ICD-10-CM | POA: Insufficient documentation

## 2013-12-21 DIAGNOSIS — I5032 Chronic diastolic (congestive) heart failure: Secondary | ICD-10-CM | POA: Insufficient documentation

## 2013-12-21 DIAGNOSIS — Z8669 Personal history of other diseases of the nervous system and sense organs: Secondary | ICD-10-CM | POA: Insufficient documentation

## 2013-12-21 DIAGNOSIS — E119 Type 2 diabetes mellitus without complications: Secondary | ICD-10-CM | POA: Insufficient documentation

## 2013-12-21 DIAGNOSIS — N186 End stage renal disease: Secondary | ICD-10-CM | POA: Insufficient documentation

## 2013-12-21 DIAGNOSIS — Z8639 Personal history of other endocrine, nutritional and metabolic disease: Secondary | ICD-10-CM | POA: Insufficient documentation

## 2013-12-21 DIAGNOSIS — S0230XA Fracture of orbital floor, unspecified side, initial encounter for closed fracture: Secondary | ICD-10-CM

## 2013-12-21 DIAGNOSIS — Z7982 Long term (current) use of aspirin: Secondary | ICD-10-CM | POA: Insufficient documentation

## 2013-12-21 DIAGNOSIS — S298XXA Other specified injuries of thorax, initial encounter: Secondary | ICD-10-CM | POA: Insufficient documentation

## 2013-12-21 DIAGNOSIS — Z794 Long term (current) use of insulin: Secondary | ICD-10-CM | POA: Insufficient documentation

## 2013-12-21 DIAGNOSIS — I120 Hypertensive chronic kidney disease with stage 5 chronic kidney disease or end stage renal disease: Secondary | ICD-10-CM | POA: Insufficient documentation

## 2013-12-21 DIAGNOSIS — Y929 Unspecified place or not applicable: Secondary | ICD-10-CM | POA: Insufficient documentation

## 2013-12-21 DIAGNOSIS — S52509A Unspecified fracture of the lower end of unspecified radius, initial encounter for closed fracture: Secondary | ICD-10-CM | POA: Insufficient documentation

## 2013-12-21 DIAGNOSIS — E039 Hypothyroidism, unspecified: Secondary | ICD-10-CM | POA: Insufficient documentation

## 2013-12-21 DIAGNOSIS — S52609A Unspecified fracture of lower end of unspecified ulna, initial encounter for closed fracture: Principal | ICD-10-CM

## 2013-12-21 DIAGNOSIS — E7800 Pure hypercholesterolemia, unspecified: Secondary | ICD-10-CM | POA: Insufficient documentation

## 2013-12-21 DIAGNOSIS — Z79899 Other long term (current) drug therapy: Secondary | ICD-10-CM | POA: Insufficient documentation

## 2013-12-21 DIAGNOSIS — W101XXA Fall (on)(from) sidewalk curb, initial encounter: Secondary | ICD-10-CM | POA: Insufficient documentation

## 2013-12-21 LAB — POCT I-STAT, CHEM 8
BUN: 17 mg/dL (ref 6–23)
Calcium, Ion: 1.07 mmol/L — ABNORMAL LOW (ref 1.13–1.30)
Chloride: 95 meq/L — ABNORMAL LOW (ref 96–112)
Creatinine, Ser: 2 mg/dL — ABNORMAL HIGH (ref 0.50–1.10)
Glucose, Bld: 280 mg/dL — ABNORMAL HIGH (ref 70–99)
HCT: 37 % (ref 36.0–46.0)
Hemoglobin: 12.6 g/dL (ref 12.0–15.0)
Potassium: 3.3 meq/L — ABNORMAL LOW (ref 3.7–5.3)
Sodium: 136 meq/L — ABNORMAL LOW (ref 137–147)
TCO2: 29 mmol/L (ref 0–100)

## 2013-12-21 LAB — CBC WITH DIFFERENTIAL/PLATELET
BASOS ABS: 0 10*3/uL (ref 0.0–0.1)
Basophils Relative: 1 % (ref 0–1)
EOS ABS: 0.2 10*3/uL (ref 0.0–0.7)
EOS PCT: 3 % (ref 0–5)
HCT: 34 % — ABNORMAL LOW (ref 36.0–46.0)
Hemoglobin: 11.6 g/dL — ABNORMAL LOW (ref 12.0–15.0)
LYMPHS PCT: 14 % (ref 12–46)
Lymphs Abs: 0.8 10*3/uL (ref 0.7–4.0)
MCH: 32.1 pg (ref 26.0–34.0)
MCHC: 34.1 g/dL (ref 30.0–36.0)
MCV: 94.2 fL (ref 78.0–100.0)
Monocytes Absolute: 0.8 10*3/uL (ref 0.1–1.0)
Monocytes Relative: 14 % — ABNORMAL HIGH (ref 3–12)
Neutro Abs: 3.7 10*3/uL (ref 1.7–7.7)
Neutrophils Relative %: 68 % (ref 43–77)
Platelets: 176 10*3/uL (ref 150–400)
RBC: 3.61 MIL/uL — ABNORMAL LOW (ref 3.87–5.11)
RDW: 14.4 % (ref 11.5–15.5)
WBC: 5.4 10*3/uL (ref 4.0–10.5)

## 2013-12-21 LAB — GLUCOSE, CAPILLARY: Glucose-Capillary: 248 mg/dL — ABNORMAL HIGH (ref 70–99)

## 2013-12-21 MED ORDER — HYDROCODONE-ACETAMINOPHEN 5-325 MG PO TABS
1.0000 | ORAL_TABLET | Freq: Once | ORAL | Status: DC
  Filled 2013-12-21: qty 1

## 2013-12-21 MED ORDER — HYDROCODONE-ACETAMINOPHEN 5-325 MG PO TABS: 1.0000 | ORAL_TABLET | Freq: Four times a day (QID) | ORAL | Status: DC | PRN

## 2013-12-21 MED ORDER — ACETAMINOPHEN 325 MG PO TABS
650.0000 mg | ORAL_TABLET | Freq: Four times a day (QID) | ORAL | Status: DC | PRN
  Administered 2013-12-21: 650 mg via ORAL
  Filled 2013-12-21: qty 2

## 2013-12-21 NOTE — ED Notes (Signed)
Patient transported to CT 

## 2013-12-21 NOTE — Discharge Instructions (Signed)
Cast or Splint Care Casts and splints support injured limbs and keep bones from moving while they heal. It is important to care for your cast or splint at home.  HOME CARE INSTRUCTIONS  Keep the cast or splint uncovered during the drying period. It can take 24 to 48 hours to dry if it is made of plaster. A fiberglass cast will dry in less than 1 hour.  Do not rest the cast on anything harder than a pillow for the first 24 hours.  Do not put weight on your injured limb or apply pressure to the cast until your health care provider gives you permission.  Keep the cast or splint dry. Wet casts or splints can lose their shape and may not support the limb as well. A wet cast that has lost its shape can also create harmful pressure on your skin when it dries. Also, wet skin can become infected.  Cover the cast or splint with a plastic bag when bathing or when out in the rain or snow. If the cast is on the trunk of the body, take sponge baths until the cast is removed.  If your cast does become wet, dry it with a towel or a blow dryer on the cool setting only.  Keep your cast or splint clean. Soiled casts may be wiped with a moistened cloth.  Do not place any hard or soft foreign objects under your cast or splint, such as cotton, toilet paper, lotion, or powder.  Do not try to scratch the skin under the cast with any object. The object could get stuck inside the cast. Also, scratching could lead to an infection. If itching is a problem, use a blow dryer on a cool setting to relieve discomfort.  Do not trim or cut your cast or remove padding from inside of it.  Exercise all joints next to the injury that are not immobilized by the cast or splint. For example, if you have a long leg cast, exercise the hip joint and toes. If you have an arm cast or splint, exercise the shoulder, elbow, thumb, and fingers.  Elevate your injured arm or leg on 1 or 2 pillows for the first 1 to 3 days to decrease  swelling and pain.It is best if you can comfortably elevate your cast so it is higher than your heart. SEEK MEDICAL CARE IF:   Your cast or splint cracks.  Your cast or splint is too tight or too loose.  You have unbearable itching inside the cast.  Your cast becomes wet or develops a soft spot or area.  You have a bad smell coming from inside your cast.  You get an object stuck under your cast.  Your skin around the cast becomes red or raw.  You have new pain or worsening pain after the cast has been applied. SEEK IMMEDIATE MEDICAL CARE IF:   You have fluid leaking through the cast.  You are unable to move your fingers or toes.  You have discolored (blue or white), cool, painful, or very swollen fingers or toes beyond the cast.  You have tingling or numbness around the injured area.  You have severe pain or pressure under the cast.  You have any difficulty with your breathing or have shortness of breath.  You have chest pain. Document Released: 11/20/2000 Document Revised: 09/13/2013 Document Reviewed: 06/01/2013 [facility] Patient Information 2014 [place], [place]. Orbital Floor Fracture, Non-Blowout The eye sits in the part of the skull called the "  orbit." The upper and outside walls of the orbit are thick and strong. The inside wall (near the nose) and the orbital floor are very thin and weak. The tissues around the eye will briefly press together if there is a direct blow to the front of the eye. This leads to high pressure against the orbital walls. The inside wall and the orbital floor may break since these are the weakest walls. If the orbital floor breaks, the tissues around the eye, including the muscle that makes the eye look down, may become trapped in the sinus below when the orbital floor "blows out." If a blowout does not happen, the orbital floor fracture is considered a non-blowout orbital fracture. CAUSES An orbital floor fracture can be caused by any accident in  which an object hits the face or the face strikes against a hard object. The most common ways that people break their eye socket include:  Being hit by a blunt object, such as a baseball bat or a fist.  Striking the face on the car dashboard during a crash.  Falls.  Gunshot. SYMPTOMS  If there has been no injury to the eye itself, symptoms may include:  Puffiness (swelling) and bruising around the eye area (black eye).  Numbness of the cheek and upper gum on the side with the floor fracture. This is caused by nerve injury to these areas.  Pain around the eye.  Headache.  Ear pain on the injured side. DIAGNOSIS The diagnosis of an orbital floor fracture is suspected during an eye exam by an ophthalmologist. It is confirmed by X rays or CT scan. TREATMENT Your caregiver may suggest waiting 1 or 2 weeks for the swelling to go away before examining the eye. When the swelling lessens, your caregiver will examine the eye to see if there is any sign of a trapped muscle or double vision when looking in different directions. If double vision is not found and muscle or tissue did not get trapped, no further treatment is necessary. After that, in almost all cases, the bones heal together on their own.  HOME CARE INSTRUCTIONS  Take all pain medicine as directed by your caregiver.  Use ice packs or other cold therapy to reduce swelling as directed by your caregiver.  Do not put a contact lens in the injured eye until your caregiver approves.  Avoid dusty environments.  Always wear protective glasses or goggles when recommended. Wearing protective eyewear is not dangerous to your injured eye and will not delay healing.  As long as your other eye is seeing normally, you may return to work and drive.  You may travel by plane or be in high altitudes. However, your swelling may take longer to go away, and you may have sinus pain.  Be aware that your depth perception and your ability to judge  distance may be reduced or lost. SEEK IMMEDIATE MEDICAL CARE IF:  Your vision changes.  Your redness or swelling persists around the injured eye or gets worse.  You start to have double vision.  You have a bloody or discolored discharge from your nose.  You have a fever that lasts longer than 2 to 3 days.  You have a fever that suddenly gets worse.  Your cheek or upper gum numbness does not go away. MAKE SURE YOU:  Understand these instructions.  Will watch your condition.  Will get help right away if you are not doing well or get worse. Document Released: 02/15/2012 Document Reviewed:  02/15/2012 [employer] Patient Information 2014 [place], [place]. Radial Fracture You have a broken bone (fracture) of the forearm. This is the part of your arm between the elbow and your wrist. Your forearm is made up of two bones. These are the radius and ulna. Your fracture is in the radial shaft. This is the bone in your forearm located on the thumb side. A cast or splint is used to protect and keep your injured bone from moving. The cast or splint will be on generally for about 5 to 6 weeks, with individual variations. HOME CARE INSTRUCTIONS   Keep the injured part elevated while sitting or lying down. Keep the injury above the level of your heart (the center of the chest). This will decrease swelling and pain.  Apply ice to the injury for 15-20 minutes, 03-04 times per day while awake, for 2 days. Put the ice in a plastic bag and place a towel between the bag of ice and your cast or splint.  Move your fingers to avoid stiffness and minimize swelling.  If you have a plaster or fiberglass cast:  Do not try to scratch the skin under the cast using sharp or pointed objects.  Check the skin around the cast every day. You may put lotion on any red or sore areas.  Keep your cast dry and clean.  If you have a plaster splint:  Wear the splint as directed.  You may loosen the elastic around the  splint if your fingers become numb, tingle, or turn cold or blue.  Do not put pressure on any part of your cast or splint. It may break. Rest your cast only on a pillow for the first 24 hours until it is fully hardened.  Your cast or splint can be protected during bathing with a plastic bag. Do not lower the cast or splint into water.  Only take over-the-counter or prescription medicines for pain, discomfort, or fever as directed by your caregiver. SEEK IMMEDIATE MEDICAL CARE IF:   Your cast gets damaged or breaks.  You have more severe pain or swelling than you did before getting the cast.  You have severe pain when stretching your fingers.  There is a bad smell, new stains and/or pus-like (purulent) drainage coming from under the cast.  Your fingers or hand turn pale or blue and become cold or your loose feeling. Document Released: 05/06/2006 Document Revised: 02/15/2012 Document Reviewed: 08/02/2006 [facility] Patient Information 2014 [place], [place].

## 2013-12-21 NOTE — ED Provider Notes (Signed)
CSN: [id number]     Arrival date & time 12/21/13  4098 History   First MD Initiated Contact with Patient 12/21/13 [phone]     Chief Complaint  Patient presents with  . Fall  . Wrist Pain    right  . rib cage pain     right   (Consider location/radiation/quality/duration/timing/severity/associated sxs/prior Treatment) HPI  This is a 79 year old female with a history of hypertension, CHF, coronary artery disease, diabetes, end-stage renal disease who presents following a fall. Patient reports a mechanical fall where she tripped over a curb and fell forward. She hit her face and the right side of her body. She is currently complaining of right wrist pain, right rib pain, and pain over her right face. She denies any loss of consciousness. She takes a baby aspirin daily. She has been ambulatory since the fall. Currently rates her pain at 4/10.  Past Medical History  Diagnosis Date  . Retinopathy   . Hypertension   . Chronic diastolic CHF (congestive heart failure)     has a normal EF per echo 03/2011 & 04/2012 with diastolic dysfunction  . CAD (coronary artery disease)     Prior PCI in 1998 x 2; s/p cath in 2001, negative Myoview in 2008 & 2011, s/p cath in June 2012 with calcified disease of the proximal LCX with normal flow reserve. She has been managed medically due to her other morbidities  . Cerebrovascular disease   . Peripheral vascular disease     prior angiography showing RSFA stenosis, left anterior tibial and left posterior tibial stenoses  . Hypercholesterolemia   . Diabetes mellitus     insulin dependent  . Hypothyroidism   . Hiatal hernia   . Degenerative joint disease   . Osteoporosis   . Vitamin D deficiency   . Anemia of chronic disease   . ESRD (end stage renal disease)     a. CKD progressed to ESRD 10/2013. b. s/p LUE basilic transposition by vascular on 11/08/13.   Past Surgical History  Procedure Laterality Date  . Abdominal aortogram  07/27/2003    by Dr. Chales Abrahams  .  Bilateral lower extremity angiogram  07/27/2003    by Dr. Chales Abrahams  . Selective angiography,right superficial femoral artery, right and left iliac arteries  07/27/2003    by Dr. Chales Abrahams  . Measurement of gradient right and left iliac arteries  07/27/2003    by Dr. Chales Abrahams  . Bilateral cataract sugery  2000 and 2002    by Dr. Cecilie Kicks  . Repair left hip fracture  04/2008    by Dr. Charlann Boxer  . Bascilic vein transposition Left 11/08/2013    Procedure: BASCILIC VEIN TRANSPOSITION;  Surgeon: Pryor Ochoa, MD;  Location: [facility] OR;  Service: Vascular;  Laterality: Left;   Family History  Problem Relation Age of Onset  . Heart disease Mother   . Cancer Mother   . Cancer Father    History  Substance Use Topics  . Smoking status: Former Smoker    Types: Cigarettes    Quit date: 12/07/1974  . Smokeless tobacco: Never Used     Comment: pt started smoking in 1956  . Alcohol Use: No   OB History   Grav Para Term Preterm Abortions TAB SAB Ect Mult Living                 Review of Systems  Constitutional: Negative for fever.  Respiratory: Negative for cough, chest tightness and shortness of breath.  Cardiovascular: Positive for chest pain.  Gastrointestinal: Negative for nausea, vomiting and abdominal pain.  Genitourinary: Negative for dysuria.  Musculoskeletal: Negative for back pain and neck pain.       Right wrist pain  Skin: Positive for wound.  Neurological: Negative for headaches.  Psychiatric/Behavioral: Negative for confusion.  All other systems reviewed and are negative.    Allergies  Ibuprofen; Prednisone; and Procrit  Home Medications   Current Outpatient Rx  Name  Route  Sig  Dispense  Refill  . acetaminophen (TYLENOL) 500 MG tablet   Oral   Take 500 mg by mouth every 6 (six) hours as needed for mild pain or moderate pain.          Marland Kitchen amLODipine (NORVASC) 10 MG tablet   Oral   Take 10 mg by mouth daily.         Marland Kitchen aspirin EC 81 MG tablet   Oral   Take 81 mg by mouth  daily.         Marland Kitchen atorvastatin (LIPITOR) 20 MG tablet   Oral   Take 1 tablet (20 mg total) by mouth daily at 6 PM.   90 tablet   1   . B Complex-C-Folic Acid (RENA-VITE PO)   Oral   Take 1 tablet by mouth daily.         . carvedilol (COREG) 12.5 MG tablet   Oral   Take 1 tablet (12.5 mg total) by mouth 2 (two) times daily with a meal.   180 tablet   1   . Cholecalciferol (VITAMIN D) 1000 UNITS capsule   Oral   Take 2,000 Units by mouth daily.          . cloNIDine (CATAPRES) 0.1 MG tablet   Oral   Take 0.1 mg by mouth daily at 10 pm.          . hydrALAZINE (APRESOLINE) 100 MG tablet   Oral   Take 1 tablet (100 mg total) by mouth 3 (three) times daily.   90 tablet   3   . insulin glargine (LANTUS) 100 UNIT/ML injection   Subcutaneous   Inject 0.12 mLs (12 Units total) into the skin at bedtime.         . insulin lispro (HUMALOG) 100 UNIT/ML injection   Subcutaneous   Inject 0-10 Units into the skin 3 (three) times daily before meals. Home sliding scale         . isosorbide mononitrate (ISMO,MONOKET) 20 MG tablet   Oral   Take 1 tablet (20 mg total) by mouth 2 (two) times daily.   180 tablet   1   . levothyroxine (SYNTHROID) 75 MCG tablet   Oral   Take 1 tablet (75 mcg total) by mouth daily.   90 tablet   3   . pantoprazole (PROTONIX) 40 MG tablet   Oral   Take 1 tablet (40 mg total) by mouth daily.   90 tablet   3   . HYDROcodone-acetaminophen (NORCO/VICODIN) 5-325 MG per tablet   Oral   Take 1 tablet by mouth every 6 (six) hours as needed for moderate pain.   20 tablet   0   . nitroGLYCERIN (NITROSTAT) 0.4 MG SL tablet   Sublingual   Place 0.4 mg under the tongue every 5 (five) minutes as needed for chest pain.         . traMADol (ULTRAM) 50 MG tablet   Oral   Take 50 mg by mouth every  6 (six) hours as needed for moderate pain or severe pain.          BP 184/47  Pulse 68  Temp(Src) 97.7 F (36.5 C) (Oral)  Resp 20  SpO2  99% Physical Exam  Nursing note and vitals reviewed. Constitutional: She is oriented to person, place, and time.  Elderly, no acute distress  HENT:  Head: Normocephalic.  Right Ear: External ear normal.  Left Ear: External ear normal.  Ecchymosis over the right, dried blood about the oropharynx and lips, midface stable, teeth stable  Eyes: EOM are normal. Pupils are equal, round, and reactive to light.  Neck: Normal range of motion. Neck supple.  No midline tenderness  Cardiovascular: Normal rate, regular rhythm and normal heart sounds.   Pulmonary/Chest: Effort normal and breath sounds normal. No respiratory distress. She has no wheezes. She exhibits tenderness.  Abdominal: Soft. Bowel sounds are normal. There is no tenderness. There is no rebound.  Musculoskeletal: Normal range of motion. She exhibits no edema.  Normal range of motion at the hips and knees, tenderness palpation of the distal forearm without obvious deformity, 2+ radial pulse  Neurological: She is alert and oriented to person, place, and time.  Skin: Skin is warm and dry.  Abrasion over right knee, see HEENT  Psychiatric: She has a normal mood and affect.    ED Course  Procedures (including critical care time) Labs Review Labs Reviewed  CBC WITH DIFFERENTIAL - Abnormal; Notable for the following:    RBC 3.61 (*)    Hemoglobin 11.6 (*)    HCT 34.0 (*)    Monocytes Relative 14 (*)    All other components within normal limits  GLUCOSE, CAPILLARY - Abnormal; Notable for the following:    Glucose-Capillary 248 (*)    All other components within normal limits  POCT I-STAT, CHEM 8 - Abnormal; Notable for the following:    Sodium 136 (*)    Potassium 3.3 (*)    Chloride 95 (*)    Creatinine, Ser 2.00 (*)    Glucose, Bld 280 (*)    Calcium, Ion 1.07 (*)    All other components within normal limits   Imaging Review Dg Chest 2 View  12/21/2013   CLINICAL DATA:  Fall, history of hypertension  EXAM: CHEST  2 VIEW   COMPARISON:  11/01/2013  FINDINGS: Mild to moderate cardiac enlargement. Vascular pattern normal. No consolidation or effusion. Hemodialysis catheter on the right with both tips over the right cardiac silhouette. Bony thorax intact. Thoracic aortic calcifications stable.  IMPRESSION: Cardiac enlargement with no acute findings.   Electronically Signed   By: Esperanza Heir M.D.   On: 12/21/2013 11:10   Dg Pelvis 1-2 Views  12/21/2013   CLINICAL DATA:  FALL, right hip pain, WRIST PAIN  EXAM: PELVIS - 1-2 VIEW  COMPARISON:  None.  FINDINGS: There is no acute fracture or dislocation. There is generalized osteopenia. There are 3 cannulated screws transfixing an old healed left femoral neck fracture.  There is lower lumbar spine spondylosis.  IMPRESSION: No acute osseous injury of the pelvis.   Electronically Signed   By: Elige Ko   On: 12/21/2013 11:13   Dg Forearm Right  12/21/2013   CLINICAL DATA:  Fall and pain  EXAM: RIGHT FOREARM - 2 VIEW  COMPARISON:  None.  FINDINGS: Known minimally displaced fracture distal radial metaphysis as described on wrist radiographs performed today. Subtle ulnar styloid process fracture. Extensive vascular calcification. No other fractures.  IMPRESSION: Fracture distal radius and ulna.   Electronically Signed   By: Esperanza Heir M.D.   On: 12/21/2013 11:11   Dg Wrist Complete Right  12/21/2013   CLINICAL DATA:  Fall, right wrist pain  EXAM: RIGHT WRIST - COMPLETE 3+ VIEW  COMPARISON:  None.  FINDINGS: Diffuse osteopenia. Extensive vascular calcification. There is subtle linear lucency across the distal radial metaphysis. There is mild deformity of the medial and lateral cortex of the distal radial metaphysis. There is mild apex volar angulation. There is calcification of the triangular fibrocartilage. There is a subtle minimally displaced fracture of the diminutive ulnar styloid process.  IMPRESSION: Nondisplaced mildly angulated fracture of the distal radial metaphysis,  with subtle ulnar styloid process fracture as well.   Electronically Signed   By: Esperanza Heir M.D.   On: 12/21/2013 11:07   Ct Head Wo Contrast  12/21/2013   CLINICAL DATA:  Fall.  Right-sided facial pain.  EXAM: CT HEAD WITHOUT CONTRAST  CT MAXILLOFACIAL WITHOUT CONTRAST  CT CERVICAL SPINE WITHOUT CONTRAST  TECHNIQUE: Multidetector CT imaging of the head, cervical spine, and maxillofacial structures were performed using the standard protocol without intravenous contrast. Multiplanar CT image reconstructions of the cervical spine and maxillofacial structures were also generated.  COMPARISON:  None.  FINDINGS: CT HEAD FINDINGS  No acute cortical infarct, hemorrhage, or mass lesion is present. The ventricles are of normal size. Mild atrophy is within normal limits for age. The right globe is hyperdense. This likely relates to a prosthesis, previous injection, or injury.  High-density material within the right maxillary sinus is compatible with blood and sinus fractures. Please see the CT maxillofacial report below.  The skull is otherwise unremarkable. The paranasal sinuses and mastoid air cells are otherwise clear. Atherosclerotic calcifications are present in the cavernous carotid arteries and at the dural margins of the vertebral arteries.  CT MAXILLOFACIAL FINDINGS  A non depressed right orbital floor fracture is present. The medial orbital wall is intact. Minimally displaced anterior and posterolateral maxillary sinus fractures are present as well. The zygomatic arch is intact bilaterally. The mandible is intact and located.  The nasal bones are intact. The ethmoid air cells are clear. Atherosclerotic calcifications are present. High-density material within the right maxillary sinus is compatible with blood. The paranasal sinuses and mastoid air cells are otherwise clear.  CT CERVICAL SPINE FINDINGS  Cervical spine is imaged from the skullbase through T3. Grade 1 anterolisthesis at C2-3 measures 3 mm.  There is ankylosis across the disc space at C4-5 and potentially at C3-4. Slight retrolisthesis is noted at C5-6 and C6-7. There is chronic loss of disc height at C5-6, C6-7, and C7-T1. No acute fracture or traumatic subluxation is evident.  The lung apices are clear. The soft tissues of the neck demonstrate dense atherosclerotic calcifications at the carotid bifurcations bilaterally. There is some heterogeneity of the thyroid without a discrete lesion.  IMPRESSION: 1. Nondisplaced fractures involving the right orbital floor, anterior, and posterolateral maxillary sinus. 2. High-density material within the right maxillary sinus is compatible with blood, associated with the fractures. 3. No additional fractures. 4. Normal CT appearance of the brain. 5. Moderate spondylosis of the cervical spine with straightening and reversal of the normal lordosis as described. 6. Heterogeneity of the thyroid without a discrete lesion. 7. Atherosclerotic disease is noted.   Electronically Signed   By: Gennette Pac M.D.   On: 12/21/2013 10:50   Ct Chest Wo Contrast  12/21/2013   CLINICAL DATA:  Chest pain status post fall  EXAM: CT CHEST WITHOUT CONTRAST  TECHNIQUE: Multidetector CT imaging of the chest was performed following the standard protocol without IV contrast.  COMPARISON:  None.  FINDINGS: Noncontrast evaluation thoracic inlet is unremarkable.  Multichamber cardiac enlargement is appreciated. Atherosclerotic calcifications identified within the aorta and coronary vessels. There is no evidence of a thoracic aortic aneurysm. A dialysis catheter via and right internal jugular approach is appreciated with tips projected region the right atrium. A moderate-sized hiatal hernia is identified. Prominent lymph nodes identified within the precarinal region on the right largest measuring 1.1 cm in short axis image number 20 series 2. Small bilateral effusions are appreciated. Deep tendon atelectasis identified within the lung  bases. No further evidence of focal regions of consolidation or focal infiltrates. There is mild prominence of the interstitial markings particularly within the lung bases. A chronic rib fracture is identified along the lateral aspect of the second rib on the right. There is not appear to be evidence of acute fractures.  Visualized upper abdominal viscera demonstrate no gross abnormalities.  The osseous structures demonstrate multilevel degenerative changes within the thoracic spine without evidence of aggressive appearing osseous lesions.  IMPRESSION: Very small bilateral pleural effusions and dependent atelectasis. There is no evidence of pneumothorax. No focal regions of consolidation are appreciated. A healed rib fracture projects along the lateral aspect of the second rib on the right. Prominent mediastinal lymph nodes are identified in the precarinal region on the right. Atherosclerotic changes identified within the vasculature. Small to moderate sized hiatal hernia.   Electronically Signed   By: Salome Holmes M.D.   On: 12/21/2013 13:43   Ct Cervical Spine Wo Contrast  12/21/2013   CLINICAL DATA:  Fall.  Right-sided facial pain.  EXAM: CT HEAD WITHOUT CONTRAST  CT MAXILLOFACIAL WITHOUT CONTRAST  CT CERVICAL SPINE WITHOUT CONTRAST  TECHNIQUE: Multidetector CT imaging of the head, cervical spine, and maxillofacial structures were performed using the standard protocol without intravenous contrast. Multiplanar CT image reconstructions of the cervical spine and maxillofacial structures were also generated.  COMPARISON:  None.  FINDINGS: CT HEAD FINDINGS  No acute cortical infarct, hemorrhage, or mass lesion is present. The ventricles are of normal size. Mild atrophy is within normal limits for age. The right globe is hyperdense. This likely relates to a prosthesis, previous injection, or injury.  High-density material within the right maxillary sinus is compatible with blood and sinus fractures. Please see the  CT maxillofacial report below.  The skull is otherwise unremarkable. The paranasal sinuses and mastoid air cells are otherwise clear. Atherosclerotic calcifications are present in the cavernous carotid arteries and at the dural margins of the vertebral arteries.  CT MAXILLOFACIAL FINDINGS  A non depressed right orbital floor fracture is present. The medial orbital wall is intact. Minimally displaced anterior and posterolateral maxillary sinus fractures are present as well. The zygomatic arch is intact bilaterally. The mandible is intact and located.  The nasal bones are intact. The ethmoid air cells are clear. Atherosclerotic calcifications are present. High-density material within the right maxillary sinus is compatible with blood. The paranasal sinuses and mastoid air cells are otherwise clear.  CT CERVICAL SPINE FINDINGS  Cervical spine is imaged from the skullbase through T3. Grade 1 anterolisthesis at C2-3 measures 3 mm. There is ankylosis across the disc space at C4-5 and potentially at C3-4. Slight retrolisthesis is noted at C5-6 and C6-7. There is chronic loss of disc height at C5-6, C6-7, and C7-T1. No  acute fracture or traumatic subluxation is evident.  The lung apices are clear. The soft tissues of the neck demonstrate dense atherosclerotic calcifications at the carotid bifurcations bilaterally. There is some heterogeneity of the thyroid without a discrete lesion.  IMPRESSION: 1. Nondisplaced fractures involving the right orbital floor, anterior, and posterolateral maxillary sinus. 2. High-density material within the right maxillary sinus is compatible with blood, associated with the fractures. 3. No additional fractures. 4. Normal CT appearance of the brain. 5. Moderate spondylosis of the cervical spine with straightening and reversal of the normal lordosis as described. 6. Heterogeneity of the thyroid without a discrete lesion. 7. Atherosclerotic disease is noted.   Electronically Signed   By: Gennette Pac M.D.   On: 12/21/2013 10:50   Dg Shoulder Right Port  12/21/2013   CLINICAL DATA:  Right shoulder pain after fall.  EXAM: PORTABLE RIGHT SHOULDER - 2+ VIEW  COMPARISON:  None.  FINDINGS: There is no evidence of fracture or dislocation. Visualized ribs appear normal. Degenerative changes are seen involving the right glenohumeral joint. Soft tissues are unremarkable.  IMPRESSION: Degenerative changes of right glenohumeral joint. No acute abnormality seen in the right shoulder.   Electronically Signed   By: Roque Lias M.D.   On: 12/21/2013 15:14   Dg Knee Complete 4 Views Right  12/21/2013   CLINICAL DATA:  Fall.  EXAM: RIGHT KNEE - COMPLETE 4+ VIEW  COMPARISON:  None.  FINDINGS: Diffuse osteopenia and degenerative change present. No acute bony or joint abnormality identified. Peripheral vascular disease noted.  IMPRESSION: No acute abnormality.   Electronically Signed   By: Maisie Fus  Register   On: 12/21/2013 11:05   Ct Maxillofacial Wo Cm  12/21/2013   CLINICAL DATA:  Fall.  Right-sided facial pain.  EXAM: CT HEAD WITHOUT CONTRAST  CT MAXILLOFACIAL WITHOUT CONTRAST  CT CERVICAL SPINE WITHOUT CONTRAST  TECHNIQUE: Multidetector CT imaging of the head, cervical spine, and maxillofacial structures were performed using the standard protocol without intravenous contrast. Multiplanar CT image reconstructions of the cervical spine and maxillofacial structures were also generated.  COMPARISON:  None.  FINDINGS: CT HEAD FINDINGS  No acute cortical infarct, hemorrhage, or mass lesion is present. The ventricles are of normal size. Mild atrophy is within normal limits for age. The right globe is hyperdense. This likely relates to a prosthesis, previous injection, or injury.  High-density material within the right maxillary sinus is compatible with blood and sinus fractures. Please see the CT maxillofacial report below.  The skull is otherwise unremarkable. The paranasal sinuses and mastoid air cells are otherwise  clear. Atherosclerotic calcifications are present in the cavernous carotid arteries and at the dural margins of the vertebral arteries.  CT MAXILLOFACIAL FINDINGS  A non depressed right orbital floor fracture is present. The medial orbital wall is intact. Minimally displaced anterior and posterolateral maxillary sinus fractures are present as well. The zygomatic arch is intact bilaterally. The mandible is intact and located.  The nasal bones are intact. The ethmoid air cells are clear. Atherosclerotic calcifications are present. High-density material within the right maxillary sinus is compatible with blood. The paranasal sinuses and mastoid air cells are otherwise clear.  CT CERVICAL SPINE FINDINGS  Cervical spine is imaged from the skullbase through T3. Grade 1 anterolisthesis at C2-3 measures 3 mm. There is ankylosis across the disc space at C4-5 and potentially at C3-4. Slight retrolisthesis is noted at C5-6 and C6-7. There is chronic loss of disc height at C5-6, C6-7, and C7-T1. No  acute fracture or traumatic subluxation is evident.  The lung apices are clear. The soft tissues of the neck demonstrate dense atherosclerotic calcifications at the carotid bifurcations bilaterally. There is some heterogeneity of the thyroid without a discrete lesion.  IMPRESSION: 1. Nondisplaced fractures involving the right orbital floor, anterior, and posterolateral maxillary sinus. 2. High-density material within the right maxillary sinus is compatible with blood, associated with the fractures. 3. No additional fractures. 4. Normal CT appearance of the brain. 5. Moderate spondylosis of the cervical spine with straightening and reversal of the normal lordosis as described. 6. Heterogeneity of the thyroid without a discrete lesion. 7. Atherosclerotic disease is noted.   Electronically Signed   By: Gennette Pachris  Mattern M.D.   On: 12/21/2013 10:50    EKG Interpretation   None       MDM   1. Orbital floor fracture   2. Maxillary  sinus fracture   3. Radius fracture    Patient presents following a fall. She's not currently on anticoagulation. The fall was mechanical in nature. Plain films were obtained in CT head and neck were also obtained. CT is notable for orbital floor fracture and maxillary sinus fractures. Patient has a distal radius fracture as well.  I discussed the patient with Dr. Emeline DarlingGore from ENT. Her facial fractures are not op.  I also discussed the patient with Dr. Roda ShuttersXu, ortho, who states patient can be splinted in followup in one week for repeat imaging. Plain films of the chest and pelvis are reassuring. Patient has continued pain over the right lateral rib cage. CT scan noncontrasted was obtained. Patient has a history of recent renal failure requiring dialysis but does still make urine. CT chest negative.  Patient was able to by mouth challenge and ambulate prior to discharge. She was given followup instructions and pain medication.  After history, exam, and medical workup I feel the patient has been appropriately medically screened and is safe for discharge home. Pertinent diagnoses were discussed with the patient. Patient was given return precautions.     Shon Batonourtney F Horton, MD 12/21/13 [phone]

## 2013-12-21 NOTE — ED Notes (Signed)
Pt thought the curb was flat when she stepped up but it was. Pt fell on her right side, c/o pain right wrist, right rib pain, has swelling to right eye and dried blood noted on lips.

## 2013-12-21 NOTE — ED Notes (Signed)
Pt is having  A shoulder x ray done and is not ready for d/c

## 2013-12-25 ENCOUNTER — Encounter: Payer: Self-pay | Admitting: Vascular Surgery

## 2013-12-26 ENCOUNTER — Encounter: Payer: Medicare Other | Admitting: Vascular Surgery

## 2013-12-26 ENCOUNTER — Ambulatory Visit (INDEPENDENT_AMBULATORY_CARE_PROVIDER_SITE_OTHER): Payer: Medicare Other | Admitting: Vascular Surgery

## 2013-12-26 ENCOUNTER — Ambulatory Visit (HOSPITAL_COMMUNITY)
Admission: RE | Admit: 2013-12-26 | Discharge: 2013-12-26 | Disposition: A | Discharge status: Home / Self Care | Payer: Medicare Other | Source: Ambulatory Visit | Attending: Vascular Surgery | Admitting: Vascular Surgery

## 2013-12-26 ENCOUNTER — Encounter: Payer: Self-pay | Admitting: Vascular Surgery

## 2013-12-26 VITALS — BP 87/43 | HR 75 | Resp 18 | Ht 64.0 in | Wt 122.6 lb

## 2013-12-26 DIAGNOSIS — Z4931 Encounter for adequacy testing for hemodialysis: Secondary | ICD-10-CM

## 2013-12-26 DIAGNOSIS — N186 End stage renal disease: Secondary | ICD-10-CM

## 2013-12-26 NOTE — Progress Notes (Signed)
Subjective:     Patient ID: Nicole [name], female   DOB: 09-18-1935, 79 y.o.   MRN: [id number]  HPI this [age] her old female returns for initial followup regarding her left basilic vein transposition performed by me on 11/08/2013. Patient has end-stage renal disease on hemodialysis via a cuffed catheter in the right IJ. She denies any pain or numbness in the left hand. She did have some swelling initially in the distal forearm on the left which has resolved.   Review of Systems     Objective:   Physical Exam BP 87/43  Pulse 75  Resp 18  Ht 5\' 4"  (1.626 m)  Wt 122 lb 9.6 oz (55.611 kg)  BMI 21.03 kg/m2  General well-developed well-nourished female no apparent distress alert and oriented x3 Left upper extremity with excellent pulse and palpable thrill in upper arm basilic vein transposition fistula. One to 2+ radial pulse palpable distally. Left and well perfused.  Today ordered a duplex can of the fistula which revealed excellent flow through the fistula and no significant narrowing or side branches.      Assessment:     Excellent appearing left basilic vein transposition-patient with end-stage renal disease    Plan:     Okay to begin using basilic vein transposition on left on 02/06/2014 A successful for several sessions then catheter can be removed Return to see me on a when necessary basis

## 2014-02-02 ENCOUNTER — Other Ambulatory Visit: Payer: Self-pay | Admitting: Pulmonary Disease

## 2014-02-02 MED ORDER — LEVOTHYROXINE SODIUM 75 MCG PO TABS: 75.0000 ug | ORAL_TABLET | Freq: Every day | ORAL | Status: DC

## 2014-02-15 ENCOUNTER — Telehealth: Payer: Self-pay | Admitting: Pulmonary Disease

## 2014-02-15 NOTE — Telephone Encounter (Signed)
LMTCB

## 2014-02-16 NOTE — Telephone Encounter (Signed)
lmtcb x2 

## 2014-02-21 NOTE — Telephone Encounter (Signed)
I spoke with the pt and she has received her medication. Nothing further needed. Carron CurieJennifer Castillo, CMA

## 2014-03-08 ENCOUNTER — Other Ambulatory Visit: Payer: Self-pay

## 2014-03-08 MED ORDER — AMLODIPINE BESYLATE 10 MG PO TABS: 10.0000 mg | ORAL_TABLET | Freq: Every day | ORAL | Status: DC

## 2014-03-08 MED ORDER — ATORVASTATIN CALCIUM 20 MG PO TABS: 20.0000 mg | ORAL_TABLET | Freq: Every day | ORAL | Status: DC

## 2014-03-08 MED ORDER — ISOSORBIDE MONONITRATE 20 MG PO TABS
20.0000 mg | ORAL_TABLET | Freq: Two times a day (BID) | ORAL | Status: AC
Start: 2014-03-08 — End: ?

## 2014-03-08 MED ORDER — CARVEDILOL 12.5 MG PO TABS: 12.5000 mg | ORAL_TABLET | Freq: Two times a day (BID) | ORAL | Status: AC

## 2014-03-26 ENCOUNTER — Telehealth: Payer: Self-pay | Admitting: Pulmonary Disease

## 2014-03-26 MED ORDER — CIPROFLOXACIN HCL 250 MG PO TABS
250.0000 mg | ORAL_TABLET | Freq: Two times a day (BID) | ORAL | Status: DC
Start: 2014-03-26 — End: 2014-05-22

## 2014-03-26 NOTE — Telephone Encounter (Signed)
Spoke with pt. Aware of recs. RX sent in 

## 2014-03-26 NOTE — Telephone Encounter (Signed)
Per SN---  cipro 250 mg  #14  1 po bid 

## 2014-03-26 NOTE — Telephone Encounter (Signed)
Pt calling again.Nicole [name] ° °

## 2014-03-26 NOTE — Telephone Encounter (Signed)
Pt c/o burning with urination since last week.  Pt states she does not urinate a lot due to dialysis but when she does it burns.  Denies fever or back pain.  Please advise. Pt informed that she will need to get set up with new primary md.  [facility].  Allergies  Allergen Reactions  . Ibuprofen     REACTION: edema---thyroid problems  . Prednisone     Causes severe fluid retention  . Procrit [Epoetin Alfa]     Per Dr. Eliane DecreePatel's note: she apparently has had an "adverse reaction" to Procrit after which she gets diffuse arthralgias and myalgias.

## 2014-04-19 ENCOUNTER — Telehealth: Payer: Self-pay | Admitting: Pulmonary Disease

## 2014-04-19 NOTE — Telephone Encounter (Signed)
LMTCB for the pt 

## 2014-04-20 NOTE — Telephone Encounter (Signed)
lmtcb on home phone. Unable to leave message on mobile phone. Will call back.

## 2014-04-23 NOTE — Telephone Encounter (Signed)
Pt c/o pain in LLQ right below ribs x 1 week and has been belching very hard--deep belching.  Pt states that she started taking Simethicone and Gas-X yesterday for possible gas Pt states that pain seems to have subsided today and is improving.  Pt pt, will hold off on appt with SN at this time. Will contact our office if symptoms worsen again. Pt states that she has an upcoming appt with new PCP 05/22/14  Nothing further needed.

## 2014-05-21 ENCOUNTER — Telehealth: Payer: Self-pay

## 2014-05-21 NOTE — Telephone Encounter (Signed)
Left message for call back Non identifiable  

## 2014-05-22 ENCOUNTER — Encounter: Payer: Self-pay | Admitting: Family Medicine

## 2014-05-22 ENCOUNTER — Ambulatory Visit (INDEPENDENT_AMBULATORY_CARE_PROVIDER_SITE_OTHER): Payer: Medicare Other | Admitting: Family Medicine

## 2014-05-22 VITALS — BP 120/50 | HR 67 | Temp 98.3°F | Ht 61.75 in | Wt 109.8 lb

## 2014-05-22 DIAGNOSIS — E1165 Type 2 diabetes mellitus with hyperglycemia: Secondary | ICD-10-CM

## 2014-05-22 DIAGNOSIS — E785 Hyperlipidemia, unspecified: Secondary | ICD-10-CM

## 2014-05-22 DIAGNOSIS — E118 Type 2 diabetes mellitus with unspecified complications: Principal | ICD-10-CM

## 2014-05-22 DIAGNOSIS — I10 Essential (primary) hypertension: Secondary | ICD-10-CM

## 2014-05-22 DIAGNOSIS — R197 Diarrhea, unspecified: Secondary | ICD-10-CM

## 2014-05-22 DIAGNOSIS — IMO0002 Reserved for concepts with insufficient information to code with codable children: Secondary | ICD-10-CM

## 2014-05-22 DIAGNOSIS — E039 Hypothyroidism, unspecified: Secondary | ICD-10-CM

## 2014-05-22 MED ORDER — NITROGLYCERIN 0.4 MG SL SUBL: 0.4000 mg | SUBLINGUAL_TABLET | SUBLINGUAL | Status: DC | PRN

## 2014-05-22 MED ORDER — INSULIN LISPRO 100 UNIT/ML ~~LOC~~ SOLN: 5.0000 [IU] | Freq: Three times a day (TID) | SUBCUTANEOUS | Status: DC

## 2014-05-22 NOTE — Telephone Encounter (Signed)
Medication and allergies:  Reviewed and updated  90 day supply/mail order: [facility]MAIL [facility] - [place], [place] - [address] Local pharmacy:  [facility] - [place], [place] - [address]   Immunizations due:  Tdap and Shingles   A/P: Personal, family history and past surgical hx:  Reviewed and updated PAP- 2013- normal per patient [facility]- unsure of last colonoscopy. Dr. Jarold MottoPatterson [facility]- 03/06/13- benign findings--bilateral vascular calcifications Tdap- DUE PNA- 04/23/09 Shingles- DUE  To Discuss with Provider: Needs refill Humalog---[facility] May need a refill on Nitroglycerin.

## 2014-05-22 NOTE — Patient Instructions (Signed)

## 2014-05-22 NOTE — Progress Notes (Signed)
Pre visit review using our clinic review tool, if applicable. No additional management support is needed unless otherwise documented below in the visit note. 

## 2014-05-22 NOTE — Progress Notes (Signed)
Subjective:    Patient ID: Nicole [name], female    DOB: 03-Jun-1935, 79 y.o.   MRN: [id number]  HPI  HPI HYPERTENSION  Blood pressure range-good  Chest pain- no      Dyspnea- no Lightheadedness- no   Edema- no Other side effects - no   Medication compliance: good Low salt diet- yes  DIABETES  Blood Sugar ranges-good  Polyuria- no New Visual problems- no Hypoglycemic symptoms- no Other side effects-no Medication compliance - good Last eye exam- done per pt Foot exam- today  HYPERLIPIDEMIA  Medication compliance- good RUQ pain- no  Muscle aches- no Other side effects-no    Review of Systems As above.    Past Medical History  Diagnosis Date  . Retinopathy   . Hypertension   . Chronic diastolic CHF (congestive heart failure)     has a normal EF per echo 03/2011 & 04/2012 with diastolic dysfunction  . CAD (coronary artery disease)     Prior PCI in 1998 x 2; s/p cath in 2001, negative Myoview in 2008 & 2011, s/p cath in June 2012 with calcified disease of the proximal LCX with normal flow reserve. She has been managed medically due to her other morbidities  . Cerebrovascular disease   . Peripheral vascular disease     prior angiography showing RSFA stenosis, left anterior tibial and left posterior tibial stenoses  . Hypercholesterolemia   . Diabetes mellitus     insulin dependent  . Hypothyroidism   . Hiatal hernia   . Degenerative joint disease   . Osteoporosis   . Vitamin D deficiency   . Anemia of chronic disease   . ESRD (end stage renal disease)     a. CKD progressed to ESRD 10/2013. b. s/p LUE basilic transposition by vascular on 11/08/13.  . Dialysis patient     Paulo FruitMon, Wed, and Friday   History   Social History  . Marital Status: Married    Spouse Name: Llana AlimentJames L. Kuhnle    Number of Children: 2  . Years of Education: N/A   Occupational History  . retired    Social History Main Topics  . Smoking status: Former Smoker    Types: Cigarettes    Quit date: 12/07/1974  . Smokeless tobacco: Never Used     Comment: pt started smoking in 1956  . Alcohol Use: No  . Drug Use: No  . Sexual Activity: No   Other Topics Concern  . Not on file   Social History Narrative   Married to Allied Waste Industriesjames Azad   2 children---son has kidney and pancrease transplant   Family History  Problem Relation Age of Onset  . Heart disease Mother   . Cancer Mother   . Cancer Father   . Cancer Brother    Current Outpatient Prescriptions  Medication Sig Dispense Refill  . acetaminophen (TYLENOL) 500 MG tablet Take 500 mg by mouth every 6 (six) hours as needed for mild pain or moderate pain.       Marland Kitchen. amLODipine (NORVASC) 10 MG tablet Take 1 tablet (10 mg total) by mouth daily.  90 tablet  0  . aspirin EC 81 MG tablet Take 81 mg by mouth daily.      Marland Kitchen. atorvastatin (LIPITOR) 20 MG tablet Take 1 tablet (20 mg total) by mouth daily at 6 PM.  90 tablet  0  . B Complex-C-Folic Acid (RENA-VITE PO) Take 1 tablet by mouth daily.      . carvedilol (COREG)  12.5 MG tablet Take 1 tablet (12.5 mg total) by mouth 2 (two) times daily with a meal.  180 tablet  0  . Cholecalciferol (VITAMIN D) 1000 UNITS capsule Take 2,000 Units by mouth daily.       . cloNIDine (CATAPRES) 0.1 MG tablet Take 0.1 mg by mouth daily at 10 pm.       . hydrALAZINE (APRESOLINE) 100 MG tablet Take 1 tablet (100 mg total) by mouth 3 (three) times daily.  90 tablet  3  . insulin glargine (LANTUS) 100 UNIT/ML injection Inject 0.12 mLs (12 Units total) into the skin at bedtime.      . insulin lispro (HUMALOG) 100 UNIT/ML injection Inject 0.05-0.15 mLs (5-15 Units total) into the skin 3 (three) times daily before meals. Home sliding scale  30 mL  2  . isosorbide mononitrate (ISMO,MONOKET) 20 MG tablet Take 1 tablet (20 mg total) by mouth 2 (two) times daily.  180 tablet  0  . levothyroxine (SYNTHROID) 75 MCG tablet Take 1 tablet (75 mcg total) by mouth daily.  90 tablet  1  . nitroGLYCERIN (NITROSTAT) 0.4  MG SL tablet Place 1 tablet (0.4 mg total) under the tongue every 5 (five) minutes as needed for chest pain.  30 tablet  0  . pantoprazole (PROTONIX) 40 MG tablet Take 1 tablet (40 mg total) by mouth daily.  90 tablet  3  . traMADol (ULTRAM) 50 MG tablet Take 50 mg by mouth every 6 (six) hours as needed for moderate pain or severe pain.       No current facility-administered medications for this visit.   Past Surgical History  Procedure Laterality Date  . Abdominal aortogram  07/27/2003    by Dr. Chales Abrahams  . Bilateral lower extremity angiogram  07/27/2003    by Dr. Chales Abrahams  . Selective angiography,right superficial femoral artery, right and left iliac arteries  07/27/2003    by Dr. Chales Abrahams  . Measurement of gradient right and left iliac arteries  07/27/2003    by Dr. Chales Abrahams  . Bilateral cataract sugery  2000 and 2002    by Dr. Cecilie Kicks  . Repair left hip fracture  04/2008    by Dr. Charlann Boxer  . Bascilic vein transposition Left 11/08/2013    Procedure: BASCILIC VEIN TRANSPOSITION;  Surgeon: Pryor Ochoa, MD;  Location: [facility] OR;  Service: Vascular;  Laterality: Left;  Marland Kitchen Eye surgery      Vision loss in Right Eye    Objective:   Physical Exam   BP 120/50  Pulse 67  Temp(Src) 98.3 F (36.8 C) (Oral)  Ht 5' 1.75" (1.568 m)  Wt 109 lb 12.8 oz (49.805 kg)  BMI 20.26 kg/m2  SpO2 97% General appearance: alert, cooperative, appears stated age and no distress Head: Normocephalic, without obvious abnormality, atraumatic Throat: lips, mucosa, and tongue normal; teeth and gums normal Neck: no adenopathy, supple, symmetrical, trachea midline and thyroid not enlarged, symmetric, no tenderness/mass/nodules Lungs: clear to auscultation bilaterally Heart: regular rate and rhythm, S1, S2 normal, no murmur, click, rub or gallop Extremities: extremities normal, atraumatic, no cyanosis or edema      Assessment & Plan:  1. Type II or unspecified type diabetes mellitus with unspecified complication,  uncontrolled Check labs, con't meds - Basic metabolic panel - CBC with Differential - Hepatic function panel - Lipid panel - Hemoglobin A1c - Microalbumin / creatinine urine ratio - POCT urinalysis dipstick - TSH - Ambulatory referral to Podiatry  2. HTN (hypertension) Stable, con't  meds - CBC with Differential - Hepatic function panel - Lipid panel - Microalbumin / creatinine urine ratio - POCT urinalysis dipstick  3. Other and unspecified hyperlipidemia Check labs, con't meds - CBC with Differential - Hepatic function panel - Lipid panel - Microalbumin / creatinine urine ratio - POCT urinalysis dipstick  4. Unspecified hypothyroidism Check labs - TSH  5. Diarrhea Check labs - Ambulatory referral to Gastroenterology

## 2014-05-23 ENCOUNTER — Telehealth: Payer: Self-pay | Admitting: Family Medicine

## 2014-05-23 LAB — CBC WITH DIFFERENTIAL/PLATELET
Basophils Absolute: 0 10*3/uL (ref 0.0–0.1)
Basophils Relative: 0.3 % (ref 0.0–3.0)
EOS PCT: 4.6 % (ref 0.0–5.0)
Eosinophils Absolute: 0.3 10*3/uL (ref 0.0–0.7)
HCT: 37.9 % (ref 36.0–46.0)
HEMOGLOBIN: 12.7 g/dL (ref 12.0–15.0)
Lymphocytes Relative: 13.9 % (ref 12.0–46.0)
Lymphs Abs: 1 10*3/uL (ref 0.7–4.0)
MCHC: 33.4 g/dL (ref 30.0–36.0)
MCV: 105.1 fl — ABNORMAL HIGH (ref 78.0–100.0)
Monocytes Absolute: 0.9 10*3/uL (ref 0.1–1.0)
Monocytes Relative: 12.6 % — ABNORMAL HIGH (ref 3.0–12.0)
NEUTROS ABS: 4.7 10*3/uL (ref 1.4–7.7)
Neutrophils Relative %: 68.6 % (ref 43.0–77.0)
Platelets: 173 10*3/uL (ref 150.0–400.0)
RBC: 3.61 Mil/uL — AB (ref 3.87–5.11)
RDW: 15.4 % (ref 11.5–15.5)
WBC: 6.9 10*3/uL (ref 4.0–10.5)

## 2014-05-23 LAB — BASIC METABOLIC PANEL
BUN: 21 mg/dL (ref 6–23)
CALCIUM: 8.9 mg/dL (ref 8.4–10.5)
CO2: 31 meq/L (ref 19–32)
Chloride: 95 mEq/L — ABNORMAL LOW (ref 96–112)
Creatinine, Ser: 2.4 mg/dL — ABNORMAL HIGH (ref 0.4–1.2)
GFR: 20.7 mL/min — ABNORMAL LOW (ref >60.00)
Glucose, Bld: 303 mg/dL — ABNORMAL HIGH (ref 70–99)
Potassium: 3.4 mEq/L — ABNORMAL LOW (ref 3.5–5.1)
Sodium: 135 mEq/L (ref 135–145)

## 2014-05-23 LAB — LIPID PANEL
CHOL/HDL RATIO: 3
Cholesterol: 171 mg/dL (ref 0–200)
HDL: 60.8 mg/dL (ref >39.00)
LDL Cholesterol: 70 mg/dL (ref 0–99)
NonHDL: 110.2
Triglycerides: 201 mg/dL — ABNORMAL HIGH (ref 0.0–149.0)
VLDL: 40.2 mg/dL — ABNORMAL HIGH (ref 0.0–40.0)

## 2014-05-23 LAB — HEPATIC FUNCTION PANEL
ALBUMIN: 3.9 g/dL (ref 3.5–5.2)
ALK PHOS: 126 U/L — AB (ref 39–117)
ALT: 21 U/L (ref 0–35)
AST: 29 U/L (ref 0–37)
BILIRUBIN DIRECT: 0.2 mg/dL (ref 0.0–0.3)
Total Bilirubin: 0.9 mg/dL (ref 0.2–1.2)
Total Protein: 6.7 g/dL (ref 6.0–8.3)

## 2014-05-23 LAB — TSH: TSH: 0.91 u[IU]/mL (ref 0.35–4.50)

## 2014-05-23 LAB — HEMOGLOBIN A1C: HEMOGLOBIN A1C: 9.4 % — AB (ref 4.6–6.5)

## 2014-05-23 NOTE — Telephone Encounter (Signed)
Relevant patient education mailed to patient.  

## 2014-05-24 ENCOUNTER — Other Ambulatory Visit: Payer: Self-pay

## 2014-05-24 MED ORDER — ATORVASTATIN CALCIUM 20 MG PO TABS: 20.0000 mg | ORAL_TABLET | Freq: Every day | ORAL | Status: AC

## 2014-05-24 MED ORDER — AMLODIPINE BESYLATE 10 MG PO TABS: 10.0000 mg | ORAL_TABLET | Freq: Every day | ORAL | Status: AC

## 2014-05-25 MED ORDER — INSULIN GLARGINE 100 UNIT/ML ~~LOC~~ SOLN: 15.0000 [IU] | Freq: Every day | SUBCUTANEOUS | Status: DC

## 2014-06-09 IMAGING — CR DG CHEST 2V
2 series · 2 of 2 positions shown · non-contrast
Comparison: 02/10/2013; 03/19/2011; 04/18/2008

CLINICAL DATA: Dyspnea, chest pressure, history of CHF

CHEST - 2 VIEW

[w chest pa]
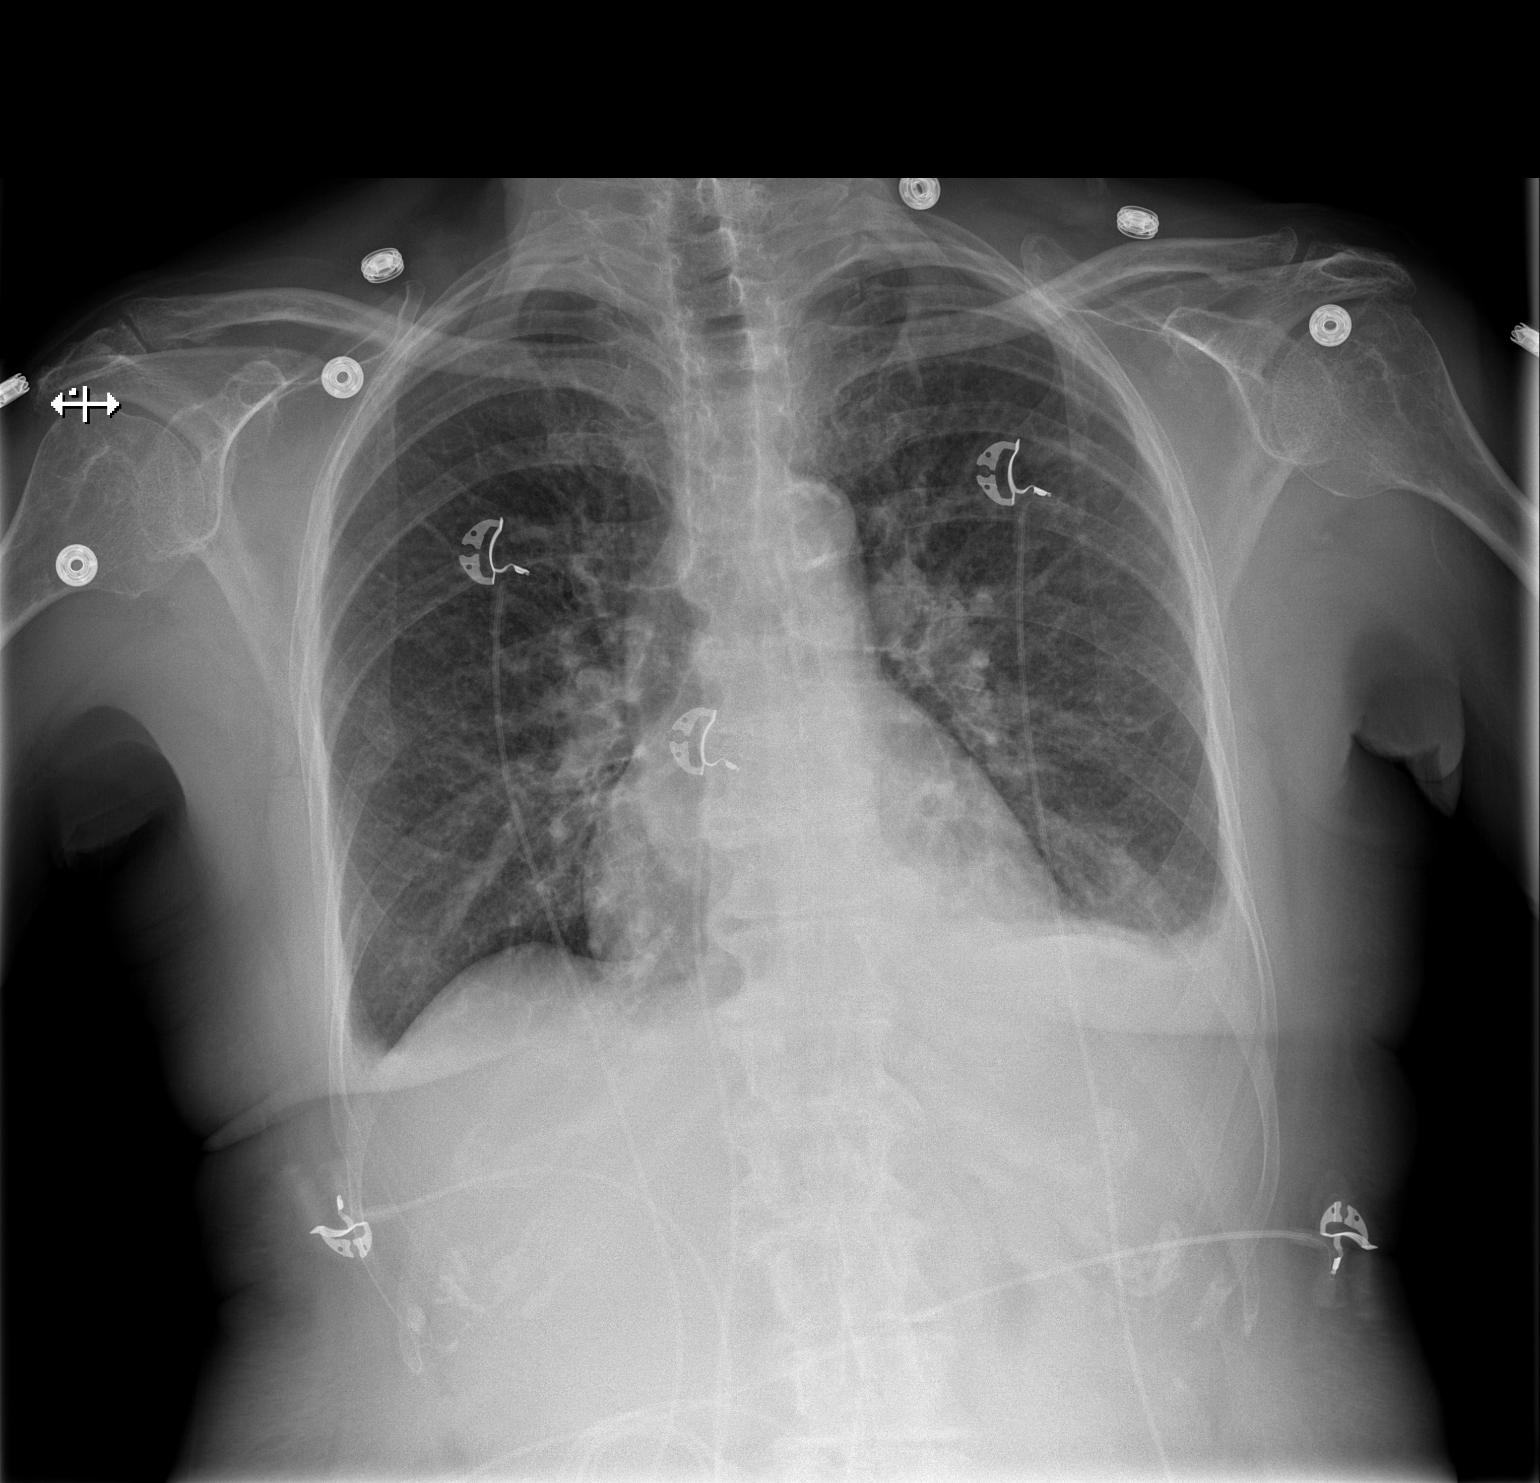

[w chest lat]
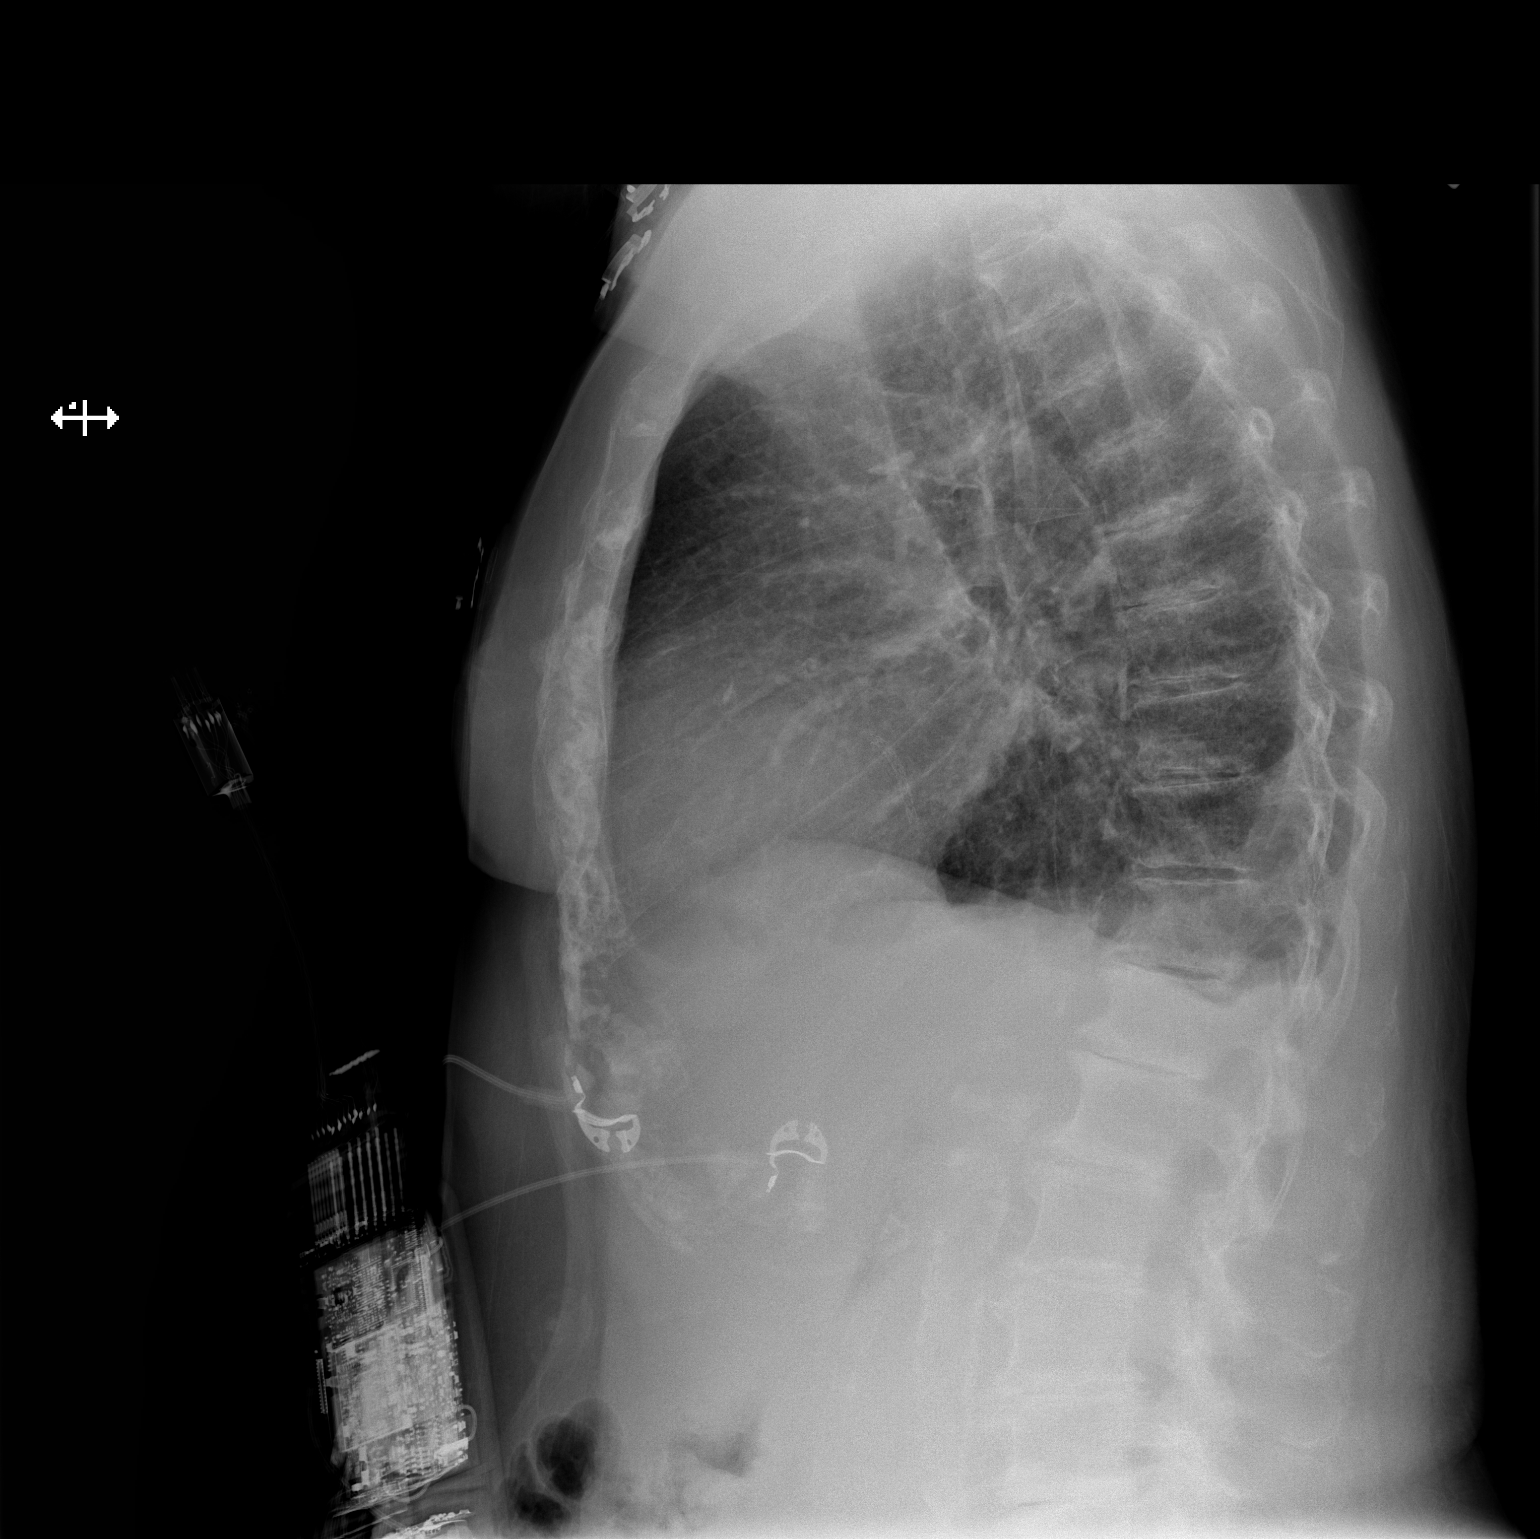

[2 of 2 positions shown; findings below may reference images not displayed]

FINDINGS: Grossly unchanged cardiac silhouette and mediastinal
contours.  Pulmonary vasculature remains indistinct with
cephalization of flow.  Grossly unchanged small bilateral effusions
and bibasilar heterogeneous opacity.  No new focal airspace
opacity.  No pneumothorax.  Unchanged bones.
IMPRESSION: Grossly unchanged findings of pulmonary edema, small bilateral
effusions and bibasilar opacities, atelectasis versus infiltrate.
A follow-up chest radiograph in 4 to 6 weeks after treatment is
recommended to ensure resolution.

## 2014-07-06 ENCOUNTER — Other Ambulatory Visit: Payer: Self-pay | Admitting: *Deleted

## 2014-07-06 MED ORDER — HYDRALAZINE HCL 100 MG PO TABS: 100.0000 mg | ORAL_TABLET | Freq: Three times a day (TID) | ORAL | Status: DC

## 2014-07-07 DIAGNOSIS — S32402A Unspecified fracture of left acetabulum, initial encounter for closed fracture: Secondary | ICD-10-CM

## 2014-07-07 HISTORY — DX: Unspecified fracture of left acetabulum, initial encounter for closed fracture: S32.402A

## 2014-07-22 ENCOUNTER — Inpatient Hospital Stay (HOSPITAL_COMMUNITY)
Admission: EM | Admit: 2014-07-22 | Discharge: 2014-07-26 | DRG: 535 | Disposition: A | Discharge status: Other Acute Inpatient Hospital | Payer: Medicare Other | Attending: Internal Medicine | Admitting: Internal Medicine

## 2014-07-22 ENCOUNTER — Emergency Department (HOSPITAL_COMMUNITY): Payer: Medicare Other

## 2014-07-22 ENCOUNTER — Encounter (HOSPITAL_COMMUNITY): Payer: Self-pay | Admitting: Emergency Medicine

## 2014-07-22 DIAGNOSIS — D631 Anemia in chronic kidney disease: Secondary | ICD-10-CM | POA: Diagnosis present

## 2014-07-22 DIAGNOSIS — N186 End stage renal disease: Secondary | ICD-10-CM | POA: Diagnosis present

## 2014-07-22 DIAGNOSIS — I509 Heart failure, unspecified: Secondary | ICD-10-CM | POA: Diagnosis present

## 2014-07-22 DIAGNOSIS — S32402A Unspecified fracture of left acetabulum, initial encounter for closed fracture: Secondary | ICD-10-CM

## 2014-07-22 DIAGNOSIS — E1129 Type 2 diabetes mellitus with other diabetic kidney complication: Secondary | ICD-10-CM | POA: Diagnosis present

## 2014-07-22 DIAGNOSIS — S32509A Unspecified fracture of unspecified pubis, initial encounter for closed fracture: Secondary | ICD-10-CM | POA: Diagnosis present

## 2014-07-22 DIAGNOSIS — N2581 Secondary hyperparathyroidism of renal origin: Secondary | ICD-10-CM | POA: Diagnosis present

## 2014-07-22 DIAGNOSIS — I739 Peripheral vascular disease, unspecified: Secondary | ICD-10-CM | POA: Diagnosis present

## 2014-07-22 DIAGNOSIS — E1139 Type 2 diabetes mellitus with other diabetic ophthalmic complication: Secondary | ICD-10-CM | POA: Diagnosis present

## 2014-07-22 DIAGNOSIS — H5460 Unqualified visual loss, one eye, unspecified: Secondary | ICD-10-CM | POA: Diagnosis present

## 2014-07-22 DIAGNOSIS — Z992 Dependence on renal dialysis: Secondary | ICD-10-CM

## 2014-07-22 DIAGNOSIS — S32409A Unspecified fracture of unspecified acetabulum, initial encounter for closed fracture: Principal | ICD-10-CM | POA: Diagnosis present

## 2014-07-22 DIAGNOSIS — D638 Anemia in other chronic diseases classified elsewhere: Secondary | ICD-10-CM

## 2014-07-22 DIAGNOSIS — M25539 Pain in unspecified wrist: Secondary | ICD-10-CM | POA: Diagnosis present

## 2014-07-22 DIAGNOSIS — I679 Cerebrovascular disease, unspecified: Secondary | ICD-10-CM

## 2014-07-22 DIAGNOSIS — E559 Vitamin D deficiency, unspecified: Secondary | ICD-10-CM | POA: Diagnosis present

## 2014-07-22 DIAGNOSIS — Z7982 Long term (current) use of aspirin: Secondary | ICD-10-CM

## 2014-07-22 DIAGNOSIS — K219 Gastro-esophageal reflux disease without esophagitis: Secondary | ICD-10-CM | POA: Diagnosis present

## 2014-07-22 DIAGNOSIS — E43 Unspecified severe protein-calorie malnutrition: Secondary | ICD-10-CM | POA: Diagnosis present

## 2014-07-22 DIAGNOSIS — W010XXA Fall on same level from slipping, tripping and stumbling without subsequent striking against object, initial encounter: Secondary | ICD-10-CM | POA: Diagnosis present

## 2014-07-22 DIAGNOSIS — E7800 Pure hypercholesterolemia, unspecified: Secondary | ICD-10-CM | POA: Diagnosis present

## 2014-07-22 DIAGNOSIS — N189 Chronic kidney disease, unspecified: Secondary | ICD-10-CM

## 2014-07-22 DIAGNOSIS — M810 Age-related osteoporosis without current pathological fracture: Secondary | ICD-10-CM | POA: Diagnosis present

## 2014-07-22 DIAGNOSIS — N039 Chronic nephritic syndrome with unspecified morphologic changes: Secondary | ICD-10-CM

## 2014-07-22 DIAGNOSIS — S72009A Fracture of unspecified part of neck of unspecified femur, initial encounter for closed fracture: Secondary | ICD-10-CM | POA: Diagnosis present

## 2014-07-22 DIAGNOSIS — S329XXD Fracture of unspecified parts of lumbosacral spine and pelvis, subsequent encounter for fracture with routine healing: Secondary | ICD-10-CM

## 2014-07-22 DIAGNOSIS — S32409B Unspecified fracture of unspecified acetabulum, initial encounter for open fracture: Secondary | ICD-10-CM

## 2014-07-22 DIAGNOSIS — I2510 Atherosclerotic heart disease of native coronary artery without angina pectoris: Secondary | ICD-10-CM | POA: Diagnosis present

## 2014-07-22 DIAGNOSIS — E11319 Type 2 diabetes mellitus with unspecified diabetic retinopathy without macular edema: Secondary | ICD-10-CM | POA: Diagnosis present

## 2014-07-22 DIAGNOSIS — S72002A Fracture of unspecified part of neck of left femur, initial encounter for closed fracture: Secondary | ICD-10-CM

## 2014-07-22 DIAGNOSIS — E039 Hypothyroidism, unspecified: Secondary | ICD-10-CM | POA: Diagnosis present

## 2014-07-22 DIAGNOSIS — S32402D Unspecified fracture of left acetabulum, subsequent encounter for fracture with routine healing: Secondary | ICD-10-CM

## 2014-07-22 DIAGNOSIS — S32402B Unspecified fracture of left acetabulum, initial encounter for open fracture: Secondary | ICD-10-CM

## 2014-07-22 DIAGNOSIS — D539 Nutritional anemia, unspecified: Secondary | ICD-10-CM | POA: Diagnosis present

## 2014-07-22 DIAGNOSIS — I120 Hypertensive chronic kidney disease with stage 5 chronic kidney disease or end stage renal disease: Secondary | ICD-10-CM | POA: Diagnosis present

## 2014-07-22 DIAGNOSIS — Z9861 Coronary angioplasty status: Secondary | ICD-10-CM

## 2014-07-22 DIAGNOSIS — E119 Type 2 diabetes mellitus without complications: Secondary | ICD-10-CM

## 2014-07-22 DIAGNOSIS — N179 Acute kidney failure, unspecified: Secondary | ICD-10-CM

## 2014-07-22 DIAGNOSIS — S329XXA Fracture of unspecified parts of lumbosacral spine and pelvis, initial encounter for closed fracture: Secondary | ICD-10-CM | POA: Diagnosis present

## 2014-07-22 DIAGNOSIS — Z8249 Family history of ischemic heart disease and other diseases of the circulatory system: Secondary | ICD-10-CM | POA: Diagnosis not present

## 2014-07-22 DIAGNOSIS — E1122 Type 2 diabetes mellitus with diabetic chronic kidney disease: Secondary | ICD-10-CM

## 2014-07-22 DIAGNOSIS — Z794 Long term (current) use of insulin: Secondary | ICD-10-CM

## 2014-07-22 DIAGNOSIS — I10 Essential (primary) hypertension: Secondary | ICD-10-CM

## 2014-07-22 DIAGNOSIS — Z87891 Personal history of nicotine dependence: Secondary | ICD-10-CM

## 2014-07-22 DIAGNOSIS — D696 Thrombocytopenia, unspecified: Secondary | ICD-10-CM | POA: Diagnosis not present

## 2014-07-22 DIAGNOSIS — I5032 Chronic diastolic (congestive) heart failure: Secondary | ICD-10-CM | POA: Diagnosis present

## 2014-07-22 DIAGNOSIS — M25559 Pain in unspecified hip: Secondary | ICD-10-CM | POA: Diagnosis present

## 2014-07-22 LAB — BASIC METABOLIC PANEL
ANION GAP: 17 — AB (ref 5–15)
BUN: 22 mg/dL (ref 6–23)
CALCIUM: 9.2 mg/dL (ref 8.4–10.5)
CO2: 27 mEq/L (ref 19–32)
Chloride: 95 mEq/L — ABNORMAL LOW (ref 96–112)
Creatinine, Ser: 3.18 mg/dL — ABNORMAL HIGH (ref 0.50–1.10)
GFR calc Af Amer: 15 mL/min — ABNORMAL LOW (ref >90)
GFR, EST NON AFRICAN AMERICAN: 13 mL/min — AB (ref >90)
GLUCOSE: 118 mg/dL — AB (ref 70–99)
POTASSIUM: 3.2 meq/L — AB (ref 3.7–5.3)
SODIUM: 139 meq/L (ref 137–147)

## 2014-07-22 LAB — GLUCOSE, CAPILLARY
GLUCOSE-CAPILLARY: 103 mg/dL — AB (ref 70–99)
Glucose-Capillary: 98 mg/dL (ref 70–99)
Glucose-Capillary: 144 mg/dL — ABNORMAL HIGH (ref 70–99)

## 2014-07-22 LAB — PROTIME-INR
INR: 1.01 (ref 0.00–1.49)
Prothrombin Time: 13.3 seconds (ref 11.6–15.2)

## 2014-07-22 LAB — TROPONIN I

## 2014-07-22 LAB — CBC WITH DIFFERENTIAL/PLATELET
Basophils Absolute: 0 10*3/uL (ref 0.0–0.1)
Basophils Relative: 0 % (ref 0–1)
EOS ABS: 0.4 10*3/uL (ref 0.0–0.7)
EOS PCT: 4 % (ref 0–5)
HCT: 32.4 % — ABNORMAL LOW (ref 36.0–46.0)
Hemoglobin: 11.3 g/dL — ABNORMAL LOW (ref 12.0–15.0)
LYMPHS ABS: 0.7 10*3/uL (ref 0.7–4.0)
LYMPHS PCT: 8 % — AB (ref 12–46)
MCH: 36.1 pg — AB (ref 26.0–34.0)
MCHC: 34.9 g/dL (ref 30.0–36.0)
MCV: 103.5 fL — ABNORMAL HIGH (ref 78.0–100.0)
Monocytes Absolute: 0.9 10*3/uL (ref 0.1–1.0)
Monocytes Relative: 10 % (ref 3–12)
NEUTROS PCT: 78 % — AB (ref 43–77)
Neutro Abs: 7.4 10*3/uL (ref 1.7–7.7)
Platelets: 162 10*3/uL (ref 150–400)
RBC: 3.13 MIL/uL — AB (ref 3.87–5.11)
RDW: 13.5 % (ref 11.5–15.5)
WBC: 9.4 10*3/uL (ref 4.0–10.5)

## 2014-07-22 LAB — TYPE AND SCREEN
ABO/RH(D): A POS
Antibody Screen: NEGATIVE

## 2014-07-22 LAB — CBG MONITORING, ED: GLUCOSE-CAPILLARY: 123 mg/dL — AB (ref 70–99)

## 2014-07-22 MED ORDER — HYDRALAZINE HCL 50 MG PO TABS
100.0000 mg | ORAL_TABLET | Freq: Two times a day (BID) | ORAL | Status: DC
  Administered 2014-07-22 – 2014-07-26 (×7): 100 mg via ORAL
  Filled 2014-07-22 (×10): qty 2

## 2014-07-22 MED ORDER — FENTANYL CITRATE 0.05 MG/ML IJ SOLN
100.0000 ug | Freq: Once | INTRAMUSCULAR | Status: AC
  Administered 2014-07-22: 100 ug via INTRAVENOUS
  Filled 2014-07-22: qty 2

## 2014-07-22 MED ORDER — INSULIN GLARGINE 100 UNIT/ML ~~LOC~~ SOLN
10.0000 [IU] | Freq: Every day | SUBCUTANEOUS | Status: DC
  Administered 2014-07-23 – 2014-07-25 (×3): 10 [IU] via SUBCUTANEOUS
  Filled 2014-07-22 (×6): qty 0.1

## 2014-07-22 MED ORDER — DOCUSATE SODIUM 100 MG PO CAPS
100.0000 mg | ORAL_CAPSULE | Freq: Two times a day (BID) | ORAL | Status: DC
  Administered 2014-07-22 – 2014-07-26 (×8): 100 mg via ORAL
  Filled 2014-07-22 (×11): qty 1

## 2014-07-22 MED ORDER — HYDROCODONE-ACETAMINOPHEN 5-325 MG PO TABS
1.0000 | ORAL_TABLET | Freq: Four times a day (QID) | ORAL | Status: DC | PRN
  Administered 2014-07-22 – 2014-07-23 (×3): 1 via ORAL
  Administered 2014-07-24 – 2014-07-26 (×4): 2 via ORAL
  Filled 2014-07-22: qty 2
  Filled 2014-07-22 (×2): qty 1
  Filled 2014-07-22: qty 2
  Filled 2014-07-22: qty 1
  Filled 2014-07-22 (×3): qty 2

## 2014-07-22 MED ORDER — HYDRALAZINE HCL 100 MG PO TABS: 100.0000 mg | ORAL_TABLET | Freq: Two times a day (BID) | ORAL | Status: DC

## 2014-07-22 MED ORDER — INSULIN ASPART 100 UNIT/ML ~~LOC~~ SOLN
0.0000 [IU] | Freq: Three times a day (TID) | SUBCUTANEOUS | Status: DC
  Administered 2014-07-23: 7 [IU] via SUBCUTANEOUS
  Administered 2014-07-24: 2 [IU] via SUBCUTANEOUS
  Administered 2014-07-24: 7 [IU] via SUBCUTANEOUS
  Administered 2014-07-25: 1 [IU] via SUBCUTANEOUS
  Administered 2014-07-25: 5 [IU] via SUBCUTANEOUS
  Administered 2014-07-26 (×2): 1 [IU] via SUBCUTANEOUS
  Administered 2014-07-26: 3 [IU] via SUBCUTANEOUS

## 2014-07-22 MED ORDER — RENA-VITE PO TABS
1.0000 | ORAL_TABLET | Freq: Every day | ORAL | Status: DC
  Administered 2014-07-22 – 2014-07-25 (×4): 1 via ORAL
  Filled 2014-07-22 (×7): qty 1

## 2014-07-22 MED ORDER — SENNOSIDES-DOCUSATE SODIUM 8.6-50 MG PO TABS: 1.0000 | ORAL_TABLET | Freq: Every evening | ORAL | Status: DC | PRN

## 2014-07-22 MED ORDER — MORPHINE SULFATE 2 MG/ML IJ SOLN
0.5000 mg | INTRAMUSCULAR | Status: DC | PRN
  Administered 2014-07-22 – 2014-07-25 (×11): 0.5 mg via INTRAVENOUS
  Filled 2014-07-22 (×10): qty 1

## 2014-07-22 MED ORDER — ISOSORBIDE MONONITRATE 20 MG PO TABS
20.0000 mg | ORAL_TABLET | Freq: Two times a day (BID) | ORAL | Status: DC
  Administered 2014-07-22 – 2014-07-26 (×8): 20 mg via ORAL
  Filled 2014-07-22 (×9): qty 1

## 2014-07-22 MED ORDER — FENTANYL CITRATE 0.05 MG/ML IJ SOLN
50.0000 ug | INTRAMUSCULAR | Status: AC | PRN
  Administered 2014-07-24 – 2014-07-25 (×2): 50 ug via INTRAVENOUS
  Filled 2014-07-22 (×2): qty 2

## 2014-07-22 MED ORDER — ATORVASTATIN CALCIUM 20 MG PO TABS
20.0000 mg | ORAL_TABLET | Freq: Every day | ORAL | Status: DC
  Administered 2014-07-22 – 2014-07-26 (×5): 20 mg via ORAL
  Filled 2014-07-22 (×5): qty 1

## 2014-07-22 MED ORDER — PANTOPRAZOLE SODIUM 40 MG PO TBEC
40.0000 mg | DELAYED_RELEASE_TABLET | Freq: Every day | ORAL | Status: DC
  Administered 2014-07-22 – 2014-07-26 (×5): 40 mg via ORAL
  Filled 2014-07-22 (×4): qty 1

## 2014-07-22 MED ORDER — AMLODIPINE BESYLATE 10 MG PO TABS
10.0000 mg | ORAL_TABLET | Freq: Every day | ORAL | Status: DC
  Administered 2014-07-22 – 2014-07-26 (×6): 10 mg via ORAL
  Filled 2014-07-22 (×5): qty 1

## 2014-07-22 MED ORDER — ONDANSETRON HCL 4 MG/2ML IJ SOLN: 4.0000 mg | Freq: Three times a day (TID) | INTRAMUSCULAR | Status: AC | PRN

## 2014-07-22 MED ORDER — SODIUM CHLORIDE 0.9 % IV SOLN
20.0000 mL | INTRAVENOUS | Status: DC
  Administered 2014-07-22: 20 mL via INTRAVENOUS

## 2014-07-22 MED ORDER — CLONIDINE HCL 0.1 MG PO TABS
0.1000 mg | ORAL_TABLET | Freq: Every day | ORAL | Status: DC
  Administered 2014-07-22 – 2014-07-25 (×4): 0.1 mg via ORAL
  Filled 2014-07-22 (×7): qty 1

## 2014-07-22 MED ORDER — CARVEDILOL 12.5 MG PO TABS
12.5000 mg | ORAL_TABLET | Freq: Two times a day (BID) | ORAL | Status: DC
  Administered 2014-07-22 – 2014-07-26 (×8): 12.5 mg via ORAL
  Filled 2014-07-22 (×10): qty 1

## 2014-07-22 MED ORDER — LEVOTHYROXINE SODIUM 88 MCG PO TABS
88.0000 ug | ORAL_TABLET | Freq: Every day | ORAL | Status: DC
  Administered 2014-07-23 – 2014-07-26 (×4): 88 ug via ORAL
  Filled 2014-07-22 (×6): qty 1

## 2014-07-22 MED ORDER — ONDANSETRON HCL 4 MG/2ML IJ SOLN
4.0000 mg | Freq: Once | INTRAMUSCULAR | Status: AC
  Administered 2014-07-22: 4 mg via INTRAVENOUS
  Filled 2014-07-22: qty 2

## 2014-07-22 NOTE — ED Notes (Signed)
Ortho MD at bedside.

## 2014-07-22 NOTE — ED Provider Notes (Signed)
Medical screening examination/treatment/procedure(s) were conducted as a shared visit with non-physician practitioner(s) and myself.  I personally evaluated the patient during the encounter.  Mechanical fall with L hip pain.  No LOC.  Dialysis patient, last treatment 2 days ago.no anticoagulant use No CTL spine pain. No chest or abdominal pain.  LLE shortened, chronic PVD changes with difficult to palpate pulses. Displaced pelvic and acetabular fractures. D/w ortho.  EKG Interpretation   Date/Time:  Sunday July 22 2014 10:40:40 EDT Ventricular Rate:  59 PR Interval:  190 QRS Duration: 96 QT Interval:  497 QTC Calculation: 492 R Axis:   42 Text Interpretation:  Sinus rhythm Borderline ST depression, diffuse leads  Borderline prolonged QT interval inferior lateral ST depressions No  significant change was found Confirmed by Manus GunningANCOUR  MD, Art Levan [phone]([id number]) on  07/22/2014 10:47:13 AM       Glynn OctaveStephen Natally Ribera, MD 07/22/14 2003

## 2014-07-22 NOTE — Consult Note (Signed)
Reason for Consult:left hip pain Referring Physician: EDP  HPI: Nicole [name] is an 79 y.o. female with multiple underlying chronic and severe medical co-morbidities as documented below, mechanical fall at home earlier today with immediate complaints of severe left hip pain and inability to bear weight. Past left femoral neck fracture treated by Dr. Alvan Dame several years ago.  Past Medical History  Diagnosis Date  . Retinopathy   . Hypertension   . Chronic diastolic CHF (congestive heart failure)     has a normal EF per echo 03/2011 & 01/3535 with diastolic dysfunction  . CAD (coronary artery disease)     Prior PCI in 1998 x 2; s/p cath in 2001, negative Myoview in 2008 & 2011, s/p cath in June 2012 with calcified disease of the proximal LCX with normal flow reserve. She has been managed medically due to her other morbidities  . Cerebrovascular disease   . Peripheral vascular disease     prior angiography showing RSFA stenosis, left anterior tibial and left posterior tibial stenoses  . Hypercholesterolemia   . Diabetes mellitus     insulin dependent  . Hypothyroidism   . Hiatal hernia   . Degenerative joint disease   . Osteoporosis   . Vitamin D deficiency   . Anemia of chronic disease   . ESRD (end stage renal disease)     a. CKD progressed to ESRD 10/2013. b. s/p LUE basilic transposition by vascular on 11/08/13.  . Dialysis patient     Jory Sims, and Friday    Past Surgical History  Procedure Laterality Date  . Abdominal aortogram  07/27/2003    by Dr. Lyndel Safe  . Bilateral lower extremity angiogram  07/27/2003    by Dr. Lyndel Safe  . Selective angiography,right superficial femoral artery, right and left iliac arteries  07/27/2003    by Dr. Lyndel Safe  . Measurement of gradient right and left iliac arteries  07/27/2003    by Dr. Lyndel Safe  . Bilateral cataract sugery  2000 and 2002    by Dr. Ishmael Holter  . Repair left hip fracture  04/2008    by Dr. Alvan Dame  . Bascilic vein transposition Left  11/08/2013    Procedure: BASCILIC VEIN TRANSPOSITION;  Surgeon: Mal Misty, MD;  Location: [place];  Service: Vascular;  Laterality: Left;  Marland Kitchen Eye surgery      Vision loss in Right Eye    Family History  Problem Relation Age of Onset  . Heart disease Mother   . Cancer Mother   . Cancer Father   . Cancer Brother     Social History:  reports that she quit smoking about 39 years ago. Her smoking use included Cigarettes. She smoked 0.00 packs per day. She has never used smokeless tobacco. She reports that she does not drink alcohol or use illicit drugs.  Allergies:  Allergies  Allergen Reactions  . Ibuprofen     REACTION: edema---thyroid problems  . Prednisone     Causes severe fluid retention  . Procrit [Epoetin Alfa]     Per Dr. Serita Grit note: she apparently has had an "adverse reaction" to Procrit after which she gets diffuse arthralgias and myalgias.    Medications: Prior to Admission:  (Not in a hospital admission)  Results for orders placed during the hospital encounter of 07/22/14 (from the past 48 hour(s))  CBC WITH DIFFERENTIAL     Status: Abnormal   Collection Time    07/22/14 10:45 AM      Result Value  Ref Range   WBC 9.4  4.0 - 10.5 K/uL   RBC 3.13 (*) 3.87 - 5.11 MIL/uL   Hemoglobin 11.3 (*) 12.0 - 15.0 g/dL   HCT 32.4 (*) 36.0 - 46.0 %   MCV 103.5 (*) 78.0 - 100.0 fL   MCH 36.1 (*) 26.0 - 34.0 pg   MCHC 34.9  30.0 - 36.0 g/dL   RDW 13.5  11.5 - 15.5 %   Platelets 162  150 - 400 K/uL   Neutrophils Relative % 78 (*) 43 - 77 %   Neutro Abs 7.4  1.7 - 7.7 K/uL   Lymphocytes Relative 8 (*) 12 - 46 %   Lymphs Abs 0.7  0.7 - 4.0 K/uL   Monocytes Relative 10  3 - 12 %   Monocytes Absolute 0.9  0.1 - 1.0 K/uL   Eosinophils Relative 4  0 - 5 %   Eosinophils Absolute 0.4  0.0 - 0.7 K/uL   Basophils Relative 0  0 - 1 %   Basophils Absolute 0.0  0.0 - 0.1 K/uL  BASIC METABOLIC PANEL     Status: Abnormal   Collection Time    07/22/14 10:45 AM      Result Value Ref  Range   Sodium 139  137 - 147 mEq/L   Potassium 3.2 (*) 3.7 - 5.3 mEq/L   Chloride 95 (*) 96 - 112 mEq/L   CO2 27  19 - 32 mEq/L   Glucose, Bld 118 (*) 70 - 99 mg/dL   BUN 22  6 - 23 mg/dL   Creatinine, Ser 3.18 (*) 0.50 - 1.10 mg/dL   Calcium 9.2  8.4 - 10.5 mg/dL   GFR calc non Af Amer 13 (*) >90 mL/min   GFR calc Af Amer 15 (*) >90 mL/min   Comment: (NOTE)     The eGFR has been calculated using the CKD EPI equation.     This calculation has not been validated in all clinical situations.     eGFR's persistently <90 mL/min signify possible Chronic Kidney     Disease.   Anion gap 17 (*) 5 - 15  CBG MONITORING, ED     Status: Abnormal   Collection Time    07/22/14 12:03 PM      Result Value Ref Range   Glucose-Capillary 123 (*) 70 - 99 mg/dL    Dg Chest 2 View  07/22/2014   CLINICAL DATA:  Hip fracture.  Fall.  EXAM: CHEST  2 VIEW  COMPARISON:  12/21/2013  FINDINGS: Dialysis catheter removed. Mild cardiomegaly. Vascular congestion. Lungs hyper aerated and clear. No pneumothorax. No rib fracture.  IMPRESSION: Cardiomegaly without edema.  Hyperaeration.   Electronically Signed   By: Maryclare Bean M.D.   On: 07/22/2014 12:25   Dg Hip Complete Left  07/22/2014   CLINICAL DATA:  LEFT hip pain, fell this morning  EXAM: LEFT HIP - COMPLETE 2+ VIEW  COMPARISON:  04/16/2008, 04/15/2008, CT LEFT hip 04/15/2008  FINDINGS: Marked osseous demineralization.  Three cannulated screws present at proximal LEFT femur post pinning of a LEFT femoral neck fracture.  Newly identified LEFT pelvic fractures including a displaced fracture through the superomedial acetabulum and a minimally displaced fracture through the LEFT pubic body.  Proximal LEFT femur appears intact.  RIGHT pelvis appears intact.  Scattered atherosclerotic calcifications pelvic phleboliths.  Extensive degenerative disc and facet disease changes lower lumbar spine.  IMPRESSION: Prior pinning of the LEFT proximal femur.  Marked osseous  demineralization.  New displaced LEFT pelvic fractures including LEFT acetabulum and LEFT pubic body.   Electronically Signed   By: Lavonia Dana M.D.   On: 07/22/2014 12:24   Dg Femur Left  07/22/2014   CLINICAL DATA:  Fall  EXAM: LEFT FEMUR - 2 VIEW  COMPARISON:  Hip films same day  FINDINGS: No fracture or dislocation of the distal left femur. Joint space narrowing of the knee joint.  IMPRESSION: No distal left femur fracture   Electronically Signed   By: Suzy Bouchard M.D.   On: 07/22/2014 12:24   Ct Head Wo Contrast  07/22/2014   CLINICAL DATA:  Fall.  Left hip pain.  EXAM: CT HEAD WITHOUT CONTRAST  TECHNIQUE: Contiguous axial images were obtained from the base of the skull through the vertex without intravenous contrast.  COMPARISON:  12/21/2013  FINDINGS: Chronic ischemic changes and atrophy are stable. No mass effect, midline shift, or acute intracranial hemorrhage. Right globe prosthesis stable. No skull fracture. Stable appearance of the odontoid.  IMPRESSION: No acute intracranial pathology.  Chronic changes.   Electronically Signed   By: Maryclare Bean M.D.   On: 07/22/2014 11:28     Vitals Temp:  [97.9 F (36.6 C)] 97.9 F (36.6 C) (08/16 1010) Pulse Rate:  [50-62] 62 (08/16 1315) Resp:  [16-18] 16 (08/16 1315) BP: (157-163)/(30-48) 159/48 mmHg (08/16 1315) SpO2:  [99 %-100 %] 100 % (08/16 1315) There is no weight on file to calculate BMI.  Physical Exam: thin WF, A and O, c/o left hip pain. Pierce/AT, no clavicular crepitance, no gross bone or joint instability UE's, mild tenderness about ulnar aspect left wrist but no ecchymosis, swelling or crepitance. Grossly n/v intact BLE, changes consistent with PVD, diffusely tender about left hip with guarding. Grossly n/v intact BLE     Assessment/Plan:  Impression:  1) severely displaced left transverse acetabular fracture 2) Multiple underlying medical co morbidities  Plan: I have discussed with Ms. Beidler and her husband the complex  and severe nature of her acetabular fracture, and that most often with this degree of displacement consideration would be given towards open reduction and internal fixation. However, her underlying medical co morbidities certainly place her at significantly increased risk for perioperative morbidity and/or mortality. This is a fracture pattern that is only treated in our community by Dr. Altamese [place], our traumatologist, and I will be in touch with Dr. Marcelino Scot tomorrow to get his input.  current management recommendations: Bedrest Bucks traction to LLE   Keagan Brislin M 07/22/2014, 2:37 PM

## 2014-07-22 NOTE — Progress Notes (Signed)
Orthopedic Tech Progress Note Patient Details:  Nicole [name] 1935-10-17 [id number] Buck's traction applied to LLE with 5 lb weight as ordered. OHF applied to bed.  Musculoskeletal Traction Type of Traction: Bucks Skin Traction Traction Location: LLE Traction Weight: 5 lbs    [place] R Thompson 07/22/2014, 4:04 PM

## 2014-07-22 NOTE — ED Notes (Signed)
Hospitalist WorleyKrishnan at bedside

## 2014-07-22 NOTE — Consult Note (Signed)
Renal Service Consult Note [facility]  Nicole [name] 07/22/2014 [name] Requesting Physician:  Dr Chancy Milroy  Reason for Consult:  ESRD pt with fall and hip fracture HPI: The patient is a 79 y.o. year-old with hx of CAD, HTN, ESRD on HD since Dec 2014.  She fell and has a severe L transverse acetabular fracture.  Ortho has seen and the plan is for bedrest, traction and to consult Dr Carola Frost re possible surgical Rx. She has no current complaints   Chart review: 06/01 - chest pain  > L heart cath with nonobs CAD, neg Persantine Cardiolite. HTN, IDDM, HL 06/09 - fall, L hip fracture . Underwent ORIF of L hip fx, also DM, HTN, HL, low T4 04/12 - CAD hx PCI x 2 1998 > L heart cath sig LCx disease, pending intervention. Acute diast HF, PVD bilat LE's, DM, diab retinopathy, HTN, HL, CKD III, anemia, hypoT4, DJD, osteoporosis, quit smoking 2011. EF 65% by echo.  06/12 - admit for consideration of PCI to LCx artery > cards decided for medical Rx due to high risk of bleeding from DAPT, hx of retinal bleeds, etc.  03/14 - acute on chronic diast CHF > Rx with IV diuresis, wt down 18 lbs. Dc home.  09/14 - acute on chronic diast CHF, CKD stage III > cardiorenal syndrome, diuresed with IV lasix, down 6L at time of dc. No ACEI due to AKI.  Very low DBP, no AI by echo, likely due to severe PAD.  Renal angio ruled out RAS.  Cr 2.29 on dc. STage IV CKD and is at high risk or requiring dialysis soon. Her desire was not to undergo hemodialysis. 11/14 - acute pulm edema, vol excess > attempted diuresis, had R heart cath with PCWP 35.  Diuresis failed and pt was started on dialysis.  Outpatient HD at [facility] was setup. Pt dc'd to home, outpt HD on MWF.   ROS  no CP, sob, n/v/d  no abd pain  no HA, blurred vision  Past Medical History  Past Medical History  Diagnosis Date  . Retinopathy   . Hypertension   . Chronic diastolic CHF (congestive heart failure)     has a normal  EF per echo 03/2011 & 04/2012 with diastolic dysfunction  . CAD (coronary artery disease)     Prior PCI in 1998 x 2; s/p cath in 2001, negative Myoview in 2008 & 2011, s/p cath in June 2012 with calcified disease of the proximal LCX with normal flow reserve. She has been managed medically due to her other morbidities  . Cerebrovascular disease   . Peripheral vascular disease     prior angiography showing RSFA stenosis, left anterior tibial and left posterior tibial stenoses  . Hypercholesterolemia   . Diabetes mellitus     insulin dependent  . Hypothyroidism   . Hiatal hernia   . Degenerative joint disease   . Osteoporosis   . Vitamin D deficiency   . Anemia of chronic disease   . ESRD (end stage renal disease)     a. CKD progressed to ESRD 10/2013. b. s/p LUE basilic transposition by vascular on 11/08/13.  . Dialysis patient     Paulo Fruit, and Friday   Past Surgical History  Past Surgical History  Procedure Laterality Date  . Abdominal aortogram  07/27/2003    by Dr. Chales Abrahams  . Bilateral lower extremity angiogram  07/27/2003    by Dr. Chales Abrahams  . Selective angiography,right superficial  femoral artery, right and left iliac arteries  07/27/2003    by Dr. Chales Abrahams  . Measurement of gradient right and left iliac arteries  07/27/2003    by Dr. Chales Abrahams  . Bilateral cataract sugery  2000 and 2002    by Dr. Cecilie Kicks  . Repair left hip fracture  04/2008    by Dr. Charlann Boxer  . Bascilic vein transposition Left 11/08/2013    Procedure: BASCILIC VEIN TRANSPOSITION;  Surgeon: Pryor Ochoa, MD;  Location: [facility] OR;  Service: Vascular;  Laterality: Left;  Marland Kitchen Eye surgery      Vision loss in Right Eye   Family History  Family History  Problem Relation Age of Onset  . Heart disease Mother   . Cancer Mother   . Cancer Father   . Cancer Brother    Social History  reports that she quit smoking about 39 years ago. Her smoking use included Cigarettes. She smoked 0.00 packs per day. She has never used smokeless tobacco.  She reports that she does not drink alcohol or use illicit drugs. Allergies  Allergies  Allergen Reactions  . Ibuprofen     REACTION: edema---thyroid problems  . Prednisone     Causes severe fluid retention  . Procrit [Epoetin Alfa]     Per Dr. Eliane Decree note: she apparently has had an "adverse reaction" to Procrit after which she gets diffuse arthralgias and myalgias.   Home medications Prior to Admission medications   Medication Sig Start Date End Date Taking? Authorizing Provider  acetaminophen (TYLENOL) 500 MG tablet Take 500 mg by mouth every 6 (six) hours as needed for mild pain or moderate pain.    Yes Historical Provider, MD  amLODipine (NORVASC) 10 MG tablet Take 1 tablet (10 mg total) by mouth daily. 05/24/14  Yes Lars Masson, MD  aspirin EC 81 MG tablet Take 81 mg by mouth daily.   Yes Historical Provider, MD  atorvastatin (LIPITOR) 20 MG tablet Take 1 tablet (20 mg total) by mouth daily at 6 PM. 05/24/14  Yes Lars Masson, MD  B Complex-C-Folic Acid (RENA-VITE PO) Take 1 tablet by mouth daily.   Yes Historical Provider, MD  carvedilol (COREG) 12.5 MG tablet Take 1 tablet (12.5 mg total) by mouth 2 (two) times daily with a meal. 03/08/14  Yes Lars Masson, MD  Cholecalciferol (VITAMIN D) 1000 UNITS capsule Take 1,000 Units by mouth daily.    Yes Historical Provider, MD  cloNIDine (CATAPRES) 0.1 MG tablet Take 0.1 mg by mouth daily at 10 pm.  09/14/13  Yes Lars Masson, MD  hydrALAZINE (APRESOLINE) 100 MG tablet Take 100 mg by mouth 2 (two) times daily.   Yes Historical Provider, MD  insulin glargine (LANTUS) 100 UNIT/ML injection Inject 0.15 mLs (15 Units total) into the skin at bedtime. 05/25/14  Yes Yvonne R Lowne, DO  insulin lispro (HUMALOG) 100 UNIT/ML injection Inject 0.05-0.15 mLs (5-15 Units total) into the skin 3 (three) times daily before meals. Home sliding scale 05/22/14  Yes Grayling Congress Lowne, DO  isosorbide mononitrate (ISMO,MONOKET) 20 MG tablet Take 1  tablet (20 mg total) by mouth 2 (two) times daily. 03/08/14  Yes Lars Masson, MD  levothyroxine (SYNTHROID, LEVOTHROID) 88 MCG tablet Take 88 mcg by mouth daily before breakfast.   Yes Historical Provider, MD  pantoprazole (PROTONIX) 40 MG tablet Take 1 tablet (40 mg total) by mouth daily. 08/29/13  Yes Michele Mcalpine, MD  nitroGLYCERIN (NITROSTAT) 0.4 MG SL tablet Place  1 tablet (0.4 mg total) under the tongue every 5 (five) minutes as needed for chest pain. 05/22/14   Lelon PerlaYvonne R Lowne, DO   Liver Function Tests No results found for this basename: AST, ALT, ALKPHOS, BILITOT, PROT, ALBUMIN,  in the last 168 hours No results found for this basename: LIPASE, AMYLASE,  in the last 168 hours CBC  Recent Labs Lab 07/22/14 1045  WBC 9.4  NEUTROABS 7.4  HGB 11.3*  HCT 32.4*  MCV 103.5*  PLT 162   Basic Metabolic Panel  Recent Labs Lab 07/22/14 1045  NA 139  K 3.2*  CL 95*  CO2 27  GLUCOSE 118*  BUN 22  CREATININE 3.18*  CALCIUM 9.2    Filed Vitals:   07/22/14 1010 07/22/14 1241 07/22/14 1315  BP: 163/41 157/30 159/48  Pulse: 62 50 62  Temp: 97.9 F (36.6 C)    TempSrc: Oral    Resp: 18 18 16   SpO2: 99% 99% 100%   Exam Alert elderly WF, no distress, lying flat No rash, cyanosis or gangrene Sclera anicteric, throat clear NO jvd Chest is clear bilat RRR no MRG Abd soft, NTND, no mass or HSM No LE or UE edema Neuro is nf, Ox 3 LUA AVF is patent  HD: MWF [facility]   Assessment: 1 Fall / L hip fracture (acetabular)- per ortho 2 ESRD on HD since Nov 2014 3 HTN on norvasc, coreg, clonidine, hydralazine; no vol excess on exam, BP's up a little 4 Anemia get records, Hb 11.3 5 HPTH get records 6 DM w retinopathy on insulin 7 CAD hx PCI, f/b cardiology  Plan- HD tomorrow morning. UF 2-3 kg as BP tolerates, tight heparin. Get records in am.   Vinson Moselleob Tavarus Poteete MD (pgr) [phone]    ([phone] 07/22/2014, 2:57 PM

## 2014-07-22 NOTE — ED Provider Notes (Signed)
CSN: [id number]     Arrival date & time 07/22/14  1001 History   First MD Initiated Contact with Patient 07/22/14 1005     Chief Complaint  Patient presents with  . Hip Pain   Patient is a 79 y.o. female presenting with hip pain.  Hip Pain    Patient is a 79 y.o. Female with a PMH of chronic HF, HTN, CKD on dialysis MWF, who presents to the ED with left hip pain after a fall this morning at approximately 9 am.  Patient states that she was trying to open her blinds when she slipped and fell and landed on her left side.  Patient states that she immediately had very severe pain.  She was barely able to get up and call her son who called EMS.  Patient states that she has severe pain all over her hip.  She has a previous history of a fracture which was repaired by ORIF by Dr. Charlann Boxer approximately 6 years ago.    Past Medical History  Diagnosis Date  . Retinopathy   . Hypertension   . Chronic diastolic CHF (congestive heart failure)     has a normal EF per echo 03/2011 & 04/2012 with diastolic dysfunction  . CAD (coronary artery disease)     Prior PCI in 1998 x 2; s/p cath in 2001, negative Myoview in 2008 & 2011, s/p cath in June 2012 with calcified disease of the proximal LCX with normal flow reserve. She has been managed medically due to her other morbidities  . Cerebrovascular disease   . Peripheral vascular disease     prior angiography showing RSFA stenosis, left anterior tibial and left posterior tibial stenoses  . Hypercholesterolemia   . Diabetes mellitus     insulin dependent  . Hypothyroidism   . Hiatal hernia   . Degenerative joint disease   . Osteoporosis   . Vitamin D deficiency   . Anemia of chronic disease   . ESRD (end stage renal disease)     a. CKD progressed to ESRD 10/2013. b. s/p LUE basilic transposition by vascular on 11/08/13.  . Dialysis patient     Paulo Fruit, and Friday   Past Surgical History  Procedure Laterality Date  . Abdominal aortogram  07/27/2003    by  Dr. Chales Abrahams  . Bilateral lower extremity angiogram  07/27/2003    by Dr. Chales Abrahams  . Selective angiography,right superficial femoral artery, right and left iliac arteries  07/27/2003    by Dr. Chales Abrahams  . Measurement of gradient right and left iliac arteries  07/27/2003    by Dr. Chales Abrahams  . Bilateral cataract sugery  2000 and 2002    by Dr. Cecilie Kicks  . Repair left hip fracture  04/2008    by Dr. Charlann Boxer  . Bascilic vein transposition Left 11/08/2013    Procedure: BASCILIC VEIN TRANSPOSITION;  Surgeon: Pryor Ochoa, MD;  [facility];  Service: Vascular;  Laterality: Left;  Marland Kitchen Eye surgery      Vision loss in Right Eye   Family History  Problem Relation Age of Onset  . Heart disease Mother   . Cancer Mother   . Cancer Father   . Cancer Brother    History  Substance Use Topics  . Smoking status: Former Smoker    Types: Cigarettes    Quit date: 12/07/1974  . Smokeless tobacco: Never Used     Comment: pt started smoking in 1956  . Alcohol Use: No  OB History   Grav Para Term Preterm Abortions TAB SAB Ect Mult Living                 Review of Systems   See HPI  Allergies  Ibuprofen; Prednisone; and Procrit  Home Medications   Prior to Admission medications   Medication Sig Start Date End Date Taking? Authorizing Provider  acetaminophen (TYLENOL) 500 MG tablet Take 500 mg by mouth every 6 (six) hours as needed for mild pain or moderate pain.    Yes Historical Provider, MD  amLODipine (NORVASC) 10 MG tablet Take 1 tablet (10 mg total) by mouth daily. 05/24/14  Yes Lars Masson, MD  aspirin EC 81 MG tablet Take 81 mg by mouth daily.   Yes Historical Provider, MD  atorvastatin (LIPITOR) 20 MG tablet Take 1 tablet (20 mg total) by mouth daily at 6 PM. 05/24/14  Yes Lars Masson, MD  B Complex-C-Folic Acid (RENA-VITE PO) Take 1 tablet by mouth daily.   Yes Historical Provider, MD  carvedilol (COREG) 12.5 MG tablet Take 1 tablet (12.5 mg total) by mouth 2 (two) times daily with a  meal. 03/08/14  Yes Lars Masson, MD  Cholecalciferol (VITAMIN D) 1000 UNITS capsule Take 1,000 Units by mouth daily.    Yes Historical Provider, MD  cloNIDine (CATAPRES) 0.1 MG tablet Take 0.1 mg by mouth daily at 10 pm.  09/14/13  Yes Lars Masson, MD  hydrALAZINE (APRESOLINE) 100 MG tablet Take 100 mg by mouth 2 (two) times daily.   Yes Historical Provider, MD  insulin glargine (LANTUS) 100 UNIT/ML injection Inject 0.15 mLs (15 Units total) into the skin at bedtime. 05/25/14  Yes Yvonne R Lowne, DO  insulin lispro (HUMALOG) 100 UNIT/ML injection Inject 0.05-0.15 mLs (5-15 Units total) into the skin 3 (three) times daily before meals. Home sliding scale 05/22/14  Yes Grayling Congress Lowne, DO  isosorbide mononitrate (ISMO,MONOKET) 20 MG tablet Take 1 tablet (20 mg total) by mouth 2 (two) times daily. 03/08/14  Yes Lars Masson, MD  levothyroxine (SYNTHROID, LEVOTHROID) 88 MCG tablet Take 88 mcg by mouth daily before breakfast.   Yes Historical Provider, MD  pantoprazole (PROTONIX) 40 MG tablet Take 1 tablet (40 mg total) by mouth daily. 08/29/13  Yes Michele Mcalpine, MD  nitroGLYCERIN (NITROSTAT) 0.4 MG SL tablet Place 1 tablet (0.4 mg total) under the tongue every 5 (five) minutes as needed for chest pain. 05/22/14   Grayling Congress Lowne, DO   BP 159/48  Pulse 62  Temp(Src) 97.9 F (36.6 C) (Oral)  Resp 16  SpO2 100% Physical Exam  Nursing note and vitals reviewed. Constitutional: She is oriented to person, place, and time. She appears well-nourished. No distress.  Chronically ill appearing  HENT:  Head: Normocephalic and atraumatic.  Mouth/Throat: Oropharynx is clear and moist. No oropharyngeal exudate.  Eyes: Conjunctivae and EOM are normal. Pupils are equal, round, and reactive to light. No scleral icterus.  Neck: Normal range of motion. Neck supple. No JVD present. No thyromegaly present.  Cardiovascular: Normal rate, regular rhythm, normal heart sounds and intact distal pulses.  Exam reveals  no gallop and no friction rub.   No murmur heard. Graft in left arm with palpable thrill.  Pulmonary/Chest: Effort normal and breath sounds normal. No respiratory distress. She has no wheezes. She has no rales. She exhibits no tenderness.  Abdominal: Soft. Bowel sounds are normal. She exhibits no distension and no mass. There is no tenderness. There  is no rebound and no guarding.  Musculoskeletal:       Left hip: She exhibits decreased range of motion, tenderness, bony tenderness and crepitus. She exhibits no swelling and no laceration.  Shortening of the left leg with external rotation of the foot.  Tenderness to palpation over the groin and lateral hip and buttocks.  Severe pain with passive ROM.    Lymphadenopathy:    She has no cervical adenopathy.  Neurological: She is alert and oriented to person, place, and time. No cranial nerve deficit. Coordination normal.  Skin: Skin is warm and dry. She is not diaphoretic.  Psychiatric: She has a normal mood and affect. Her behavior is normal. Judgment and thought content normal.    ED Course  Procedures (including critical care time) Labs Review Labs Reviewed  CBC WITH DIFFERENTIAL - Abnormal; Notable for the following:    RBC 3.13 (*)    Hemoglobin 11.3 (*)    HCT 32.4 (*)    MCV 103.5 (*)    MCH 36.1 (*)    Neutrophils Relative % 78 (*)    Lymphocytes Relative 8 (*)    All other components within normal limits  BASIC METABOLIC PANEL - Abnormal; Notable for the following:    Potassium 3.2 (*)    Chloride 95 (*)    Glucose, Bld 118 (*)    Creatinine, Ser 3.18 (*)    GFR calc non Af Amer 13 (*)    GFR calc Af Amer 15 (*)    Anion gap 17 (*)    All other components within normal limits  CBG MONITORING, ED - Abnormal; Notable for the following:    Glucose-Capillary 123 (*)    All other components within normal limits  TROPONIN I  PROTIME-INR  URINALYSIS, ROUTINE W REFLEX MICROSCOPIC  TYPE AND SCREEN    Imaging Review Dg Chest  2 View  07/22/2014   CLINICAL DATA:  Hip fracture.  Fall.  EXAM: CHEST  2 VIEW  COMPARISON:  12/21/2013  FINDINGS: Dialysis catheter removed. Mild cardiomegaly. Vascular congestion. Lungs hyper aerated and clear. No pneumothorax. No rib fracture.  IMPRESSION: Cardiomegaly without edema.  Hyperaeration.   Electronically Signed   By: Maryclare Bean M.D.   On: 07/22/2014 12:25   Dg Hip Complete Left  07/22/2014   CLINICAL DATA:  LEFT hip pain, fell this morning  EXAM: LEFT HIP - COMPLETE 2+ VIEW  COMPARISON:  04/16/2008, 04/15/2008, CT LEFT hip 04/15/2008  FINDINGS: Marked osseous demineralization.  Three cannulated screws present at proximal LEFT femur post pinning of a LEFT femoral neck fracture.  Newly identified LEFT pelvic fractures including a displaced fracture through the superomedial acetabulum and a minimally displaced fracture through the LEFT pubic body.  Proximal LEFT femur appears intact.  RIGHT pelvis appears intact.  Scattered atherosclerotic calcifications pelvic phleboliths.  Extensive degenerative disc and facet disease changes lower lumbar spine.  IMPRESSION: Prior pinning of the LEFT proximal femur.  Marked osseous demineralization.  New displaced LEFT pelvic fractures including LEFT acetabulum and LEFT pubic body.   Electronically Signed   By: Ulyses Southward M.D.   On: 07/22/2014 12:24   Dg Femur Left  07/22/2014   CLINICAL DATA:  Fall  EXAM: LEFT FEMUR - 2 VIEW  COMPARISON:  Hip films same day  FINDINGS: No fracture or dislocation of the distal left femur. Joint space narrowing of the knee joint.  IMPRESSION: No distal left femur fracture   Electronically Signed   By: Genevive Bi  M.D.   On: 07/22/2014 12:24   Ct Head Wo Contrast  07/22/2014   CLINICAL DATA:  Fall.  Left hip pain.  EXAM: CT HEAD WITHOUT CONTRAST  TECHNIQUE: Contiguous axial images were obtained from the base of the skull through the vertex without intravenous contrast.  COMPARISON:  12/21/2013  FINDINGS: Chronic ischemic  changes and atrophy are stable. No mass effect, midline shift, or acute intracranial hemorrhage. Right globe prosthesis stable. No skull fracture. Stable appearance of the odontoid.  IMPRESSION: No acute intracranial pathology.  Chronic changes.   Electronically Signed   By: Maryclare BeanArt  Hoss M.D.   On: 07/22/2014 11:28     EKG Interpretation   Date/Time:  Sunday July 22 2014 10:40:40 EDT Ventricular Rate:  59 PR Interval:  190 QRS Duration: 96 QT Interval:  497 QTC Calculation: 492 R Axis:   42 Text Interpretation:  Sinus rhythm Borderline ST depression, diffuse leads  Borderline prolonged QT interval inferior lateral ST depressions No  significant change was found Confirmed by Manus GunningANCOUR  MD, STEPHEN [phone]([id number]) on  07/22/2014 10:47:13 AM      MDM   Final diagnoses:  Hip fracture, left, closed, initial encounter  ESRD (end stage renal disease)  Type 2 diabetes mellitus without complication   Patient is a 79 y.o. Female with multiple comorbidities who presents to the ED with left hip pain after a fall.  Patient had pelvic fracture which extended through the acetabulum.  Left femur is without fracture.  CBC shows anemia of acute disease which remains at baseline.  PT/INR, type and screen are currently pending per Hip fracture order set.  BMP shows stable electrolytes.  Troponin and cxr are negative at this time.  As patient was previously treated by Dr. Charlann Boxerlin I have spoken with Dr. Rennis ChrisSupple who will see the patient here in the ED and will likely not operate tonight.  I have spoken with Dr. Neoma LamingKrishan who will admit the patient to med-surg and who will consult with nephrology at this time.  Patient was seen alongside me with Dr. Manus Gunningancour who agrees with the above treatment.         Eben Burowourtney A Forcucci, PA-C 07/22/14 1559

## 2014-07-22 NOTE — ED Notes (Addendum)
Pt [facility] for [facility] of fall and L hip pain. States she accidentally fell at home this morning, denies LOC. Upon arrival c/o L hip pain, shortening noted. Received 100mcg Fentanyl per EMS. 20g R hand. Pt is alert and oriented x4. CBG 99, hemodialysis patient, treatments on Mon, Wed, and Fri, fistula to R arm.

## 2014-07-22 NOTE — H&P (Signed)
History and Physical  Nicole [name]:[id number] DOB: October 24, 1935 DOA: 07/22/2014  Referring physician: Eben Burow, PA-C PCP: Loreen Freud, DO   Chief Complaint: Hip pain after fall  HPI: Nicole [name] is a 79 y.o. female  Past medical history diastolic heart failure and end-stage renal disease on dialysis who today tripped while trying to put on her shoes and fell landing on her left side. She started having severe pain and was unable to stand up. EMS was called by her son and patient was brought into the emergency room for further evaluation.  In the emergency room, x-rays were done noting several pelvic fractures including the left acetabulum. Orthopedic surgeries notified and hospitals were called for further evaluation and admission. The rest the patient's labs are consistent with her chronic end-stage renal disease  Review of Systems:  Patient seen in emergency room. Pt complains of right-sided hip pain as well as some pain in her leg..  Pt denies any headaches, vision changes, dysphagia, chest pain, palpitations, shortness of breath, wheeze, cough, abdominal pain, hematuria, dysuria, constipation, diarrhea.  Review of systems are otherwise negative  Past Medical History  Diagnosis Date  . Retinopathy   . Hypertension   . Chronic diastolic CHF (congestive heart failure)     has a normal EF per echo 03/2011 & 04/2012 with diastolic dysfunction  . CAD (coronary artery disease)     Prior PCI in 1998 x 2; s/p cath in 2001, negative Myoview in 2008 & 2011, s/p cath in June 2012 with calcified disease of the proximal LCX with normal flow reserve. She has been managed medically due to her other morbidities  . Cerebrovascular disease   . Peripheral vascular disease     prior angiography showing RSFA stenosis, left anterior tibial and left posterior tibial stenoses  . Hypercholesterolemia   . Diabetes mellitus     insulin dependent  . Hypothyroidism   . Hiatal hernia    . Degenerative joint disease   . Osteoporosis   . Vitamin D deficiency   . Anemia of chronic disease   . ESRD (end stage renal disease)     a. CKD progressed to ESRD 10/2013. b. s/p LUE basilic transposition by vascular on 11/08/13.  . Dialysis patient     [facility], and Friday   Past Surgical History  Procedure Laterality Date  . Abdominal aortogram  07/27/2003    by Dr. Chales Abrahams  . Bilateral lower extremity angiogram  07/27/2003    by Dr. Chales Abrahams  . Selective angiography,right superficial femoral artery, right and left iliac arteries  07/27/2003    by Dr. Chales Abrahams  . Measurement of gradient right and left iliac arteries  07/27/2003    by Dr. Chales Abrahams  . Bilateral cataract sugery  2000 and 2002    by Dr. Cecilie Kicks  . Repair left hip fracture  04/2008    by Dr. Charlann Boxer  . Bascilic vein transposition Left 11/08/2013    Procedure: BASCILIC VEIN TRANSPOSITION;  Surgeon: Pryor Ochoa, MD;  Location: [facility] OR;  Service: Vascular;  Laterality: Left;  Marland Kitchen Eye surgery      Vision loss in Right Eye   Social History:  reports that she quit smoking about 39 years ago. Her smoking use included Cigarettes. She smoked 0.00 packs per day. She has never used smokeless tobacco. She reports that she does not drink alcohol or use illicit drugs. Patient lives at home by herself with her son nearby & is able to participate  in activities of daily living with use of a cane.  Allergies  Allergen Reactions  . Ibuprofen     REACTION: edema---thyroid problems  . Prednisone     Causes severe fluid retention  . Procrit [Epoetin Alfa]     Per Dr. Eliane DecreePatel's note: she apparently has had an "adverse reaction" to Procrit after which she gets diffuse arthralgias and myalgias.    Family History  Problem Relation Age of Onset  . Heart disease Mother   . Cancer Mother   . Cancer Father   . Cancer Brother     As reviewed with son  Prior to Admission medications   Medication Sig Start Date End Date Taking? Authorizing Provider    acetaminophen (TYLENOL) 500 MG tablet Take 500 mg by mouth every 6 (six) hours as needed for mild pain or moderate pain.    Yes Historical Provider, MD  amLODipine (NORVASC) 10 MG tablet Take 1 tablet (10 mg total) by mouth daily. 05/24/14  Yes Lars MassonKatarina H Nelson, MD  aspirin EC 81 MG tablet Take 81 mg by mouth daily.   Yes Historical Provider, MD  atorvastatin (LIPITOR) 20 MG tablet Take 1 tablet (20 mg total) by mouth daily at 6 PM. 05/24/14  Yes Lars MassonKatarina H Nelson, MD  B Complex-C-Folic Acid (RENA-VITE PO) Take 1 tablet by mouth daily.   Yes Historical Provider, MD  carvedilol (COREG) 12.5 MG tablet Take 1 tablet (12.5 mg total) by mouth 2 (two) times daily with a meal. 03/08/14  Yes Lars MassonKatarina H Nelson, MD  Cholecalciferol (VITAMIN D) 1000 UNITS capsule Take 1,000 Units by mouth daily.    Yes Historical Provider, MD  cloNIDine (CATAPRES) 0.1 MG tablet Take 0.1 mg by mouth daily at 10 pm.  09/14/13  Yes Lars MassonKatarina H Nelson, MD  hydrALAZINE (APRESOLINE) 100 MG tablet Take 100 mg by mouth 2 (two) times daily.   Yes Historical Provider, MD  insulin glargine (LANTUS) 100 UNIT/ML injection Inject 0.15 mLs (15 Units total) into the skin at bedtime. 05/25/14  Yes Yvonne R Lowne, DO  insulin lispro (HUMALOG) 100 UNIT/ML injection Inject 0.05-0.15 mLs (5-15 Units total) into the skin 3 (three) times daily before meals. Home sliding scale 05/22/14  Yes Grayling CongressYvonne R Lowne, DO  isosorbide mononitrate (ISMO,MONOKET) 20 MG tablet Take 1 tablet (20 mg total) by mouth 2 (two) times daily. 03/08/14  Yes Lars MassonKatarina H Nelson, MD  levothyroxine (SYNTHROID, LEVOTHROID) 88 MCG tablet Take 88 mcg by mouth daily before breakfast.   Yes Historical Provider, MD  pantoprazole (PROTONIX) 40 MG tablet Take 1 tablet (40 mg total) by mouth daily. 08/29/13  Yes Michele McalpineScott M Nadel, MD  nitroGLYCERIN (NITROSTAT) 0.4 MG SL tablet Place 1 tablet (0.4 mg total) under the tongue every 5 (five) minutes as needed for chest pain. 05/22/14   Lelon PerlaYvonne R Lowne, DO     Physical Exam: BP 159/48  Pulse 62  Temp(Src) 97.9 F (36.6 C) (Oral)  Resp 16  SpO2 100%  General:  Alert and oriented x3, fatigue Eyes: Sclera nonicteric, extraocular movements are intact ENT: Normocephalic, atraumatic, mucous membranes are slightly dry Neck: No JVD Cardiovascular: Regular rate and rhythm, S1-S2, 2/6 systolic ejection murmur Respiratory: Clear to auscultation bilaterally Abdomen: Soft, nontender, nondistended, positive bowel sounds Skin: No skin breaks, tears or lesions Musculoskeletal: No clubbing or cyanosis or edema, functional AV graft noted in left upper extremity Psychiatric: Patient is appropriate, no evidence of psychoses Neurologic: No focal deficits  Labs on Admission:  Basic Metabolic Panel:  Recent Labs Lab 07/22/14 1045  NA 139  K 3.2*  CL 95*  CO2 27  GLUCOSE 118*  BUN 22  CREATININE 3.18*  CALCIUM 9.2   Liver Function Tests: No results found for this basename: AST, ALT, ALKPHOS, BILITOT, PROT, ALBUMIN,  in the last 168 hours No results found for this basename: LIPASE, AMYLASE,  in the last 168 hours No results found for this basename: AMMONIA,  in the last 168 hours CBC:  Recent Labs Lab 07/22/14 1045  WBC 9.4  NEUTROABS 7.4  HGB 11.3*  HCT 32.4*  MCV 103.5*  PLT 162   Cardiac Enzymes: No results found for this basename: CKTOTAL, CKMB, CKMBINDEX, TROPONINI,  in the last 168 hours  BNP (last 3 results)  Recent Labs  10/29/13 1719  PROBNP 5412.0*   CBG:  Recent Labs Lab 07/22/14 1203  GLUCAP 123*    Radiological Exams on Admission: Dg Chest 2 View  07/22/2014   CLINICAL DATA:  Hip fracture.  Fall.  EXAM: CHEST  2 VIEW  COMPARISON:  12/21/2013  FINDINGS: Dialysis catheter removed. Mild cardiomegaly. Vascular congestion. Lungs hyper aerated and clear. No pneumothorax. No rib fracture.  IMPRESSION: Cardiomegaly without edema.  Hyperaeration.   Electronically Signed   By: Maryclare Bean M.D.   On:  07/22/2014 12:25   Dg Hip Complete Left  07/22/2014   CLINICAL DATA:  LEFT hip pain, fell this morning  EXAM: LEFT HIP - COMPLETE 2+ VIEW  COMPARISON:  04/16/2008, 04/15/2008, CT LEFT hip 04/15/2008  FINDINGS: Marked osseous demineralization.  Three cannulated screws present at proximal LEFT femur post pinning of a LEFT femoral neck fracture.  Newly identified LEFT pelvic fractures including a displaced fracture through the superomedial acetabulum and a minimally displaced fracture through the LEFT pubic body.  Proximal LEFT femur appears intact.  RIGHT pelvis appears intact.  Scattered atherosclerotic calcifications pelvic phleboliths.  Extensive degenerative disc and facet disease changes lower lumbar spine.  IMPRESSION: Prior pinning of the LEFT proximal femur.  Marked osseous demineralization.  New displaced LEFT pelvic fractures including LEFT acetabulum and LEFT pubic body.   Electronically Signed   By: Ulyses Southward M.D.   On: 07/22/2014 12:24   Dg Femur Left  07/22/2014   CLINICAL DATA:  Fall  EXAM: LEFT FEMUR - 2 VIEW  COMPARISON:  Hip films same day  FINDINGS: No fracture or dislocation of the distal left femur. Joint space narrowing of the knee joint.  IMPRESSION: No distal left femur fracture   Electronically Signed   By: Genevive Bi M.D.   On: 07/22/2014 12:24   Ct Head Wo Contrast  07/22/2014   CLINICAL DATA:  Fall.  Left hip pain.  EXAM: CT HEAD WITHOUT CONTRAST  TECHNIQUE: Contiguous axial images were obtained from the base of the skull through the vertex without intravenous contrast.  COMPARISON:  12/21/2013  FINDINGS: Chronic ischemic changes and atrophy are stable. No mass effect, midline shift, or acute intracranial hemorrhage. Right globe prosthesis stable. No skull fracture. Stable appearance of the odontoid.  IMPRESSION: No acute intracranial pathology.  Chronic changes.   Electronically Signed   By: Maryclare Bean M.D.   On: 07/22/2014 11:28    EKG: Independently reviewed.  Nonspecific diffuse mild ST depression, not very impressive. Normal sinus rhythm  Assessment/Plan Present on Admission:  . ESRD (end stage renal disease): Spoke with nephrology. They will see. Patient's next dialysis scheduled for tomorrow. We'll await recommendations  to see when we can coordinate surgery with dialysis  . Hypothyroidism: Stable continue Synthroid  . ANEMIA OF CHRONIC DISEASE: Stable. Secondary to nephrology.  . Chronic diastolic heart failure: Managed by dialysis  . GERD (gastroesophageal reflux disease): Stable.  Marland Kitchen HYPERTENSION: Stable. Continue home meds.  . Acetabulum fracture, left/pelvic pressure: Seen by orthopedic surgery. Recommendation is for ORIF. Her hand he will followup tomorrow. Medical standpoint, she appears to be clear. She is active with a cane with no issues of chest pain and has been tolerating dialysis study for the last 8 months  . DM (diabetes mellitus), type 2 with renal complications: Patient's Lantus at reduced dose plus sliding scale   Consultants: Nephrology Orthopedic surgery  Code Status: Full code as confirmed by patient  Family Communication: Son at the bedside   Disposition Plan: Surgery, possibly tomorrow dialysis Monday, Wednesday, Friday  Time spent: 35 minutes  Hollice Espy [facility] Pager [phone]  **Disclaimer: This note may have been dictated with voice recognition software. Similar sounding words can inadvertently be transcribed and this note may contain transcription errors which may not have been corrected upon publication of note.**

## 2014-07-23 ENCOUNTER — Inpatient Hospital Stay (HOSPITAL_COMMUNITY): Payer: Medicare Other

## 2014-07-23 DIAGNOSIS — D638 Anemia in other chronic diseases classified elsewhere: Secondary | ICD-10-CM

## 2014-07-23 DIAGNOSIS — I679 Cerebrovascular disease, unspecified: Secondary | ICD-10-CM

## 2014-07-23 DIAGNOSIS — N179 Acute kidney failure, unspecified: Secondary | ICD-10-CM

## 2014-07-23 DIAGNOSIS — K219 Gastro-esophageal reflux disease without esophagitis: Secondary | ICD-10-CM

## 2014-07-23 DIAGNOSIS — S72009A Fracture of unspecified part of neck of unspecified femur, initial encounter for closed fracture: Secondary | ICD-10-CM

## 2014-07-23 DIAGNOSIS — E43 Unspecified severe protein-calorie malnutrition: Secondary | ICD-10-CM | POA: Insufficient documentation

## 2014-07-23 LAB — CBC
HEMATOCRIT: 25.2 % — AB (ref 36.0–46.0)
Hemoglobin: 8.7 g/dL — ABNORMAL LOW (ref 12.0–15.0)
MCH: 35.5 pg — ABNORMAL HIGH (ref 26.0–34.0)
MCHC: 34.5 g/dL (ref 30.0–36.0)
MCV: 102.9 fL — ABNORMAL HIGH (ref 78.0–100.0)
PLATELETS: 129 10*3/uL — AB (ref 150–400)
RBC: 2.45 MIL/uL — ABNORMAL LOW (ref 3.87–5.11)
RDW: 13.6 % (ref 11.5–15.5)
WBC: 6.9 10*3/uL (ref 4.0–10.5)

## 2014-07-23 LAB — BASIC METABOLIC PANEL
Anion gap: 14 (ref 5–15)
BUN: 31 mg/dL — ABNORMAL HIGH (ref 6–23)
CHLORIDE: 96 meq/L (ref 96–112)
CO2: 27 mEq/L (ref 19–32)
Calcium: 8.5 mg/dL (ref 8.4–10.5)
Creatinine, Ser: 4.03 mg/dL — ABNORMAL HIGH (ref 0.50–1.10)
GFR, EST AFRICAN AMERICAN: 11 mL/min — AB (ref >90)
GFR, EST NON AFRICAN AMERICAN: 10 mL/min — AB (ref >90)
Glucose, Bld: 218 mg/dL — ABNORMAL HIGH (ref 70–99)
Potassium: 3.7 mEq/L (ref 3.7–5.3)
SODIUM: 137 meq/L (ref 137–147)

## 2014-07-23 LAB — ABO/RH: ABO/RH(D): A POS

## 2014-07-23 LAB — GLUCOSE, CAPILLARY
GLUCOSE-CAPILLARY: 148 mg/dL — AB (ref 70–99)
Glucose-Capillary: 141 mg/dL — ABNORMAL HIGH (ref 70–99)
Glucose-Capillary: 305 mg/dL — ABNORMAL HIGH (ref 70–99)
Glucose-Capillary: 202 mg/dL — ABNORMAL HIGH (ref 70–99)

## 2014-07-23 MED ORDER — HEPARIN SODIUM (PORCINE) 1000 UNIT/ML DIALYSIS: 1000.0000 [IU] | INTRAMUSCULAR | Status: DC | PRN

## 2014-07-23 MED ORDER — LIDOCAINE-PRILOCAINE 2.5-2.5 % EX CREA
1.0000 "application " | TOPICAL_CREAM | CUTANEOUS | Status: DC | PRN
  Filled 2014-07-23: qty 5

## 2014-07-23 MED ORDER — ALTEPLASE 2 MG IJ SOLR
2.0000 mg | Freq: Once | INTRAMUSCULAR | Status: DC | PRN
  Filled 2014-07-23: qty 2

## 2014-07-23 MED ORDER — LIDOCAINE HCL (PF) 1 % IJ SOLN: 5.0000 mL | INTRAMUSCULAR | Status: DC | PRN

## 2014-07-23 MED ORDER — PENTAFLUOROPROP-TETRAFLUOROETH EX AERO: 1.0000 "application " | INHALATION_SPRAY | CUTANEOUS | Status: DC | PRN

## 2014-07-23 MED ORDER — BOOST / RESOURCE BREEZE PO LIQD
1.0000 | Freq: Two times a day (BID) | ORAL | Status: DC
  Administered 2014-07-23 – 2014-07-25 (×4): 1 via ORAL
  Administered 2014-07-25: 15:00:00 via ORAL
  Administered 2014-07-26: 1 via ORAL

## 2014-07-23 MED ORDER — MORPHINE SULFATE 2 MG/ML IJ SOLN
INTRAMUSCULAR | Status: AC
  Filled 2014-07-23: qty 1

## 2014-07-23 MED ORDER — HEPARIN SODIUM (PORCINE) 1000 UNIT/ML DIALYSIS: 1500.0000 [IU] | INTRAMUSCULAR | Status: DC | PRN

## 2014-07-23 MED ORDER — SODIUM CHLORIDE 0.9 % IV SOLN: 100.0000 mL | INTRAVENOUS | Status: DC | PRN

## 2014-07-23 MED ORDER — NEPRO/CARBSTEADY PO LIQD
237.0000 mL | ORAL | Status: DC | PRN
  Filled 2014-07-23: qty 237

## 2014-07-23 NOTE — Progress Notes (Signed)
[facility]   Contacted by Dr. Rennis ChrisSupple regarding L acetabulum fracture  Based on plain hip xray it appears to possibly be a both column fracture with involvement of the quadrilateral plate and protrusion of the femoral head.   Additional imaging studies are needed to fully characterize the fracture and determine the best course of treatment.  Will evaluate Nicole [name] after these are completed that way more definitive answers can be given.   No plans for surgery today. Possibly tomorrow if it is felt that surgery is the best option  Placed order to advance diet Orders also placed for CT of pelvis and dedicated plain xrays of pelvis   Mearl LatinKeith W. Paul, PA-C [facility] [phone] (P) 07/23/2014 11:54 AM  I have reviewed and discussed in detail with Nicole [name] the patient's presentation, examination findings, and I formulated the plan outlined above.  Myrene GalasMichael Deserai Cansler, MD [facility] [phone] (p)

## 2014-07-23 NOTE — Progress Notes (Signed)
Patient ID: Nicole [name]  female  ZDG:[id number]    DOB: 1935/05/30    DOA: 07/22/2014  PCP: Loreen FreudYvonne Lowne, DO  Assessment/Plan: Principal Problem:  Pelvic fracture with Acetabulum fracture, left due to mechanical fall  - Seen by orthopedics, for now   recommended bed rest, [place] traction to LLE and Dr. Carola FrostHandy to decide ORIF - Pain currently controlled - Medically cleared for surgery, she was functionally active prior to admission, with a cane, no acute cardiac symptoms. Nephrology managing ESRD and hemodialysis, has been tolerating dialysis well.  Active Problems:   DM (diabetes mellitus), type 2 with renal complications - Continue Lantus and sliding scale insulin    ANEMIA OF CHRONIC DISEASE - Monitor CBC -Hemoglobin down to 8.7, check anemia panel, FOBT - will need transfusion if less than 8      HYPERTENSION -Currently stable     GERD (gastroesophageal reflux disease) -Continue PPI      Chronic diastolic heart failure; stable, compensated  - Volume management by dialysis     ESRD (end stage renal disease) - On hemodialysis, MWF, nephrology consulted     Hypothyroidism - Continue Synthroid, check TSH   DVT Prophylaxis:  Code Status:  Family Communication:  Disposition: will likely need rehabilitation STR after surgery   Consultants:   orthopedics    nephrology  Procedures:   hemodialysis   Antibiotics:   none     Subjective:  patient seen and examined during hemodialysis, states pain is controlled if she is not moving, no other complaints   Objective: Weight change:   Intake/Output Summary (Last 24 hours) at 07/23/14 0845 Last data filed at 07/23/14 0700  Gross per 24 hour  Intake    240 ml  Output      0 ml  Net    240 ml   Blood pressure 135/55, pulse 53, temperature 98.9 F (37.2 C), temperature source Oral, resp. rate 12, height 5\' 4"  (1.626 m), weight 52 kg (114 lb 10.2 oz), SpO2 95.00%.  Physical Exam: General: Alert and awake,  oriented x3, not in any acute distress. CVS: S1-S2 clear, no murmur rubs or gallops Chest: clear to auscultation bilaterally, no wheezing, rales or rhonchi Abdomen: soft nontender, nondistended, normal bowel sounds  Extremities: no cyanosis, clubbing or edema noted bilaterally, left leg in traction   Lab Results: Basic Metabolic Panel:  Recent Labs Lab 07/22/14 1045 07/23/14 0720  NA 139 137  K 3.2* 3.7  CL 95* 96  CO2 27 27  GLUCOSE 118* 218*  BUN 22 31*  CREATININE 3.18* 4.03*  CALCIUM 9.2 8.5   Liver Function Tests: No results found for this basename: AST, ALT, ALKPHOS, BILITOT, PROT, ALBUMIN,  in the last 168 hours No results found for this basename: LIPASE, AMYLASE,  in the last 168 hours No results found for this basename: AMMONIA,  in the last 168 hours CBC:  Recent Labs Lab 07/22/14 1045 07/23/14 0720  WBC 9.4 6.9  NEUTROABS 7.4  --   HGB 11.3* 8.7*  HCT 32.4* 25.2*  MCV 103.5* 102.9*  PLT 162 129*   Cardiac Enzymes:  Recent Labs Lab 07/22/14 1502  TROPONINI <0.30   BNP: No components found with this basename: POCBNP,  CBG:  Recent Labs Lab 07/22/14 1203 07/22/14 1508 07/22/14 1650 07/22/14 2058  GLUCAP 123* 103* 98 144*     Micro Results: No results found for this or any previous visit (from the past 240 hour(s)).  Studies/Results: Dg Chest 2 View  07/22/2014   CLINICAL DATA:  Hip fracture.  Fall.  EXAM: CHEST  2 VIEW  COMPARISON:  12/21/2013  FINDINGS: Dialysis catheter removed. Mild cardiomegaly. Vascular congestion. Lungs hyper aerated and clear. No pneumothorax. No rib fracture.  IMPRESSION: Cardiomegaly without edema.  Hyperaeration.   Electronically Signed   By: Maryclare Bean M.D.   On: 07/22/2014 12:25   Dg Hip Complete Left  07/22/2014   CLINICAL DATA:  LEFT hip pain, fell this morning  EXAM: LEFT HIP - COMPLETE 2+ VIEW  COMPARISON:  04/16/2008, 04/15/2008, CT LEFT hip 04/15/2008  FINDINGS: Marked osseous demineralization.  Three  cannulated screws present at proximal LEFT femur post pinning of a LEFT femoral neck fracture.  Newly identified LEFT pelvic fractures including a displaced fracture through the superomedial acetabulum and a minimally displaced fracture through the LEFT pubic body.  Proximal LEFT femur appears intact.  RIGHT pelvis appears intact.  Scattered atherosclerotic calcifications pelvic phleboliths.  Extensive degenerative disc and facet disease changes lower lumbar spine.  IMPRESSION: Prior pinning of the LEFT proximal femur.  Marked osseous demineralization.  New displaced LEFT pelvic fractures including LEFT acetabulum and LEFT pubic body.   Electronically Signed   By: Ulyses Southward M.D.   On: 07/22/2014 12:24   Dg Femur Left  07/22/2014   CLINICAL DATA:  Fall  EXAM: LEFT FEMUR - 2 VIEW  COMPARISON:  Hip films same day  FINDINGS: No fracture or dislocation of the distal left femur. Joint space narrowing of the knee joint.  IMPRESSION: No distal left femur fracture   Electronically Signed   By: Genevive Bi M.D.   On: 07/22/2014 12:24   Ct Head Wo Contrast  07/22/2014   CLINICAL DATA:  Fall.  Left hip pain.  EXAM: CT HEAD WITHOUT CONTRAST  TECHNIQUE: Contiguous axial images were obtained from the base of the skull through the vertex without intravenous contrast.  COMPARISON:  12/21/2013  FINDINGS: Chronic ischemic changes and atrophy are stable. No mass effect, midline shift, or acute intracranial hemorrhage. Right globe prosthesis stable. No skull fracture. Stable appearance of the odontoid.  IMPRESSION: No acute intracranial pathology.  Chronic changes.   Electronically Signed   By: Maryclare Bean M.D.   On: 07/22/2014 11:28    Medications: Scheduled Meds: . amLODipine  10 mg Oral Daily  . atorvastatin  20 mg Oral q1800  . carvedilol  12.5 mg Oral BID WC  . cloNIDine  0.1 mg Oral Q2200  . docusate sodium  100 mg Oral BID  . hydrALAZINE  100 mg Oral BID  . insulin aspart  0-9 Units Subcutaneous TID WC  .  insulin glargine  10 Units Subcutaneous QHS  . isosorbide mononitrate  20 mg Oral BID  . levothyroxine  88 mcg Oral QAC breakfast  . multivitamin  1 tablet Oral QHS  . pantoprazole  40 mg Oral Daily      LOS: 1 day   Namari Breton M.D. [facility] 07/23/2014, 8:45 AM Pager: [phone]  If 7PM-7AM, please contact night-coverage [email or web address] Password [email or web address]  **Disclaimer: This note was dictated with voice recognition software. Similar sounding words can inadvertently be transcribed and this note may contain transcription errors which may not have been corrected upon publication of note.**

## 2014-07-23 NOTE — Consult Note (Signed)
Orthopaedic Trauma Service (OTS)  Reason for Consult: complex Left acetabulum fracture  Referring Physician: K. Supple, MD (orthopaedics)   HPI: Nicole [name] is an 79 y.o.white female with a medical history notable for CHF, CAD, peripheral vascular disease, end-stage renal disease on dialysis and insulin-dependent diabetes who sustained a ground-level fall yesterday morning. Patient tripped over her bedroom slippers. She fell on her left hip. Patient had immediate onset of pain and inability to bear weight. She was brought to [facility] for evaluation where she was found to have a complex left acetabulum fracture. Do to her medical issues she was admitted to the medicine service with orthopedics consult. Patient was seen and evaluated initially by Dr. supple who is on-call for orthopedics. Given the complexity of her fracture Dr. handy and the orthopedic trauma service was consult and for further management from an orthopedic standpoint. Patient was seen today in 6e02, she complains of left hip pain. States that he Buck's traction hurts more with that on the office. She also complains of some left wrist pain as well. She denies any numbness or tingling in her lower extremities. Denies any additional injuries elsewhere.   Patient states that she uses a cane at baseline and has done so since her femoral neck fracture in 2009. She does perform basic ADLs, drives and does some grocery shopping. She does not anticipate in a lot of activities otherwise. She lives alone with her dog. She lives in a house.   Patient does not smoke and does not drink.   Patient denies any liver disease, denies any bleeding diatheses   Hemoglobin A1c 2 months ago: 9.4 Glucose today 218 Today her platelets are 129,000  Past Medical History  Diagnosis Date  . Retinopathy   . Hypertension   . Chronic diastolic CHF (congestive heart failure)     has a normal EF per echo 03/2011 & 01/2296 with diastolic dysfunction   . CAD (coronary artery disease)     Prior PCI in 1998 x 2; s/p cath in 2001, negative Myoview in 2008 & 2011, s/p cath in June 2012 with calcified disease of the proximal LCX with normal flow reserve. She has been managed medically due to her other morbidities  . Cerebrovascular disease   . Peripheral vascular disease     prior angiography showing RSFA stenosis, left anterior tibial and left posterior tibial stenoses  . Hypercholesterolemia   . Diabetes mellitus     insulin dependent  . Hypothyroidism   . Hiatal hernia   . Degenerative joint disease   . Osteoporosis   . Vitamin D deficiency   . Anemia of chronic disease   . ESRD (end stage renal disease)     a. CKD progressed to ESRD 10/2013. b. s/p LUE basilic transposition by vascular on 11/08/13.  . Dialysis patient     Jory Sims, and Friday    Past Surgical History  Procedure Laterality Date  . Abdominal aortogram  07/27/2003    by Dr. Lyndel Safe  . Bilateral lower extremity angiogram  07/27/2003    by Dr. Lyndel Safe  . Selective angiography,right superficial femoral artery, right and left iliac arteries  07/27/2003    by Dr. Lyndel Safe  . Measurement of gradient right and left iliac arteries  07/27/2003    by Dr. Lyndel Safe  . Bilateral cataract sugery  2000 and 2002    by Dr. Ishmael Holter  . Repair left hip fracture  04/2008    by Dr. Alvan Dame  . Bascilic vein  transposition Left 11/08/2013    Procedure: BASCILIC VEIN TRANSPOSITION;  Surgeon: Mal Misty, MD;  Location: [facility];  Service: Vascular;  Laterality: Left;  Marland Kitchen Eye surgery      Vision loss in Right Eye    Family History  Problem Relation Age of Onset  . Heart disease Mother   . Cancer Mother   . Cancer Father   . Cancer Brother     Social History:  reports that she quit smoking about 39 years ago. Her smoking use included Cigarettes. She smoked 0.00 packs per day. She has never used smokeless tobacco. She reports that she does not drink alcohol or use illicit drugs.  Allergies:   Allergies  Allergen Reactions  . Ibuprofen     REACTION: edema---thyroid problems  . Prednisone     Causes severe fluid retention  . Procrit [Epoetin Alfa]     Per Dr. Serita Grit note: she apparently has had an "adverse reaction" to Procrit after which she gets diffuse arthralgias and myalgias.    Medications:  I have reviewed the patient's current medications. Prior to Admission:  Prescriptions prior to admission  Medication Sig Dispense Refill  . acetaminophen (TYLENOL) 500 MG tablet Take 500 mg by mouth every 6 (six) hours as needed for mild pain or moderate pain.       Marland Kitchen amLODipine (NORVASC) 10 MG tablet Take 1 tablet (10 mg total) by mouth daily.  30 tablet  0  . aspirin EC 81 MG tablet Take 81 mg by mouth daily.      Marland Kitchen atorvastatin (LIPITOR) 20 MG tablet Take 1 tablet (20 mg total) by mouth daily at 6 PM.  30 tablet  0  . B Complex-C-Folic Acid (RENA-VITE PO) Take 1 tablet by mouth daily.      . carvedilol (COREG) 12.5 MG tablet Take 1 tablet (12.5 mg total) by mouth 2 (two) times daily with a meal.  180 tablet  0  . Cholecalciferol (VITAMIN D) 1000 UNITS capsule Take 1,000 Units by mouth daily.       . cloNIDine (CATAPRES) 0.1 MG tablet Take 0.1 mg by mouth daily at 10 pm.       . hydrALAZINE (APRESOLINE) 100 MG tablet Take 100 mg by mouth 2 (two) times daily.      . insulin glargine (LANTUS) 100 UNIT/ML injection Inject 0.15 mLs (15 Units total) into the skin at bedtime.  10 mL  2  . insulin lispro (HUMALOG) 100 UNIT/ML injection Inject 0.05-0.15 mLs (5-15 Units total) into the skin 3 (three) times daily before meals. Home sliding scale  30 mL  2  . isosorbide mononitrate (ISMO,MONOKET) 20 MG tablet Take 1 tablet (20 mg total) by mouth 2 (two) times daily.  180 tablet  0  . levothyroxine (SYNTHROID, LEVOTHROID) 88 MCG tablet Take 88 mcg by mouth daily before breakfast.      . pantoprazole (PROTONIX) 40 MG tablet Take 1 tablet (40 mg total) by mouth daily.  90 tablet  3  .  nitroGLYCERIN (NITROSTAT) 0.4 MG SL tablet Place 1 tablet (0.4 mg total) under the tongue every 5 (five) minutes as needed for chest pain.  30 tablet  0    Results for orders placed during the hospital encounter of 07/22/14 (from the past 48 hour(s))  CBC WITH DIFFERENTIAL     Status: Abnormal   Collection Time    07/22/14 10:45 AM      Result Value Ref Range   WBC 9.4  4.0 - 10.5 K/uL   RBC 3.13 (*) 3.87 - 5.11 MIL/uL   Hemoglobin 11.3 (*) 12.0 - 15.0 g/dL   HCT 32.4 (*) 36.0 - 46.0 %   MCV 103.5 (*) 78.0 - 100.0 fL   MCH 36.1 (*) 26.0 - 34.0 pg   MCHC 34.9  30.0 - 36.0 g/dL   RDW 13.5  11.5 - 15.5 %   Platelets 162  150 - 400 K/uL   Neutrophils Relative % 78 (*) 43 - 77 %   Neutro Abs 7.4  1.7 - 7.7 K/uL   Lymphocytes Relative 8 (*) 12 - 46 %   Lymphs Abs 0.7  0.7 - 4.0 K/uL   Monocytes Relative 10  3 - 12 %   Monocytes Absolute 0.9  0.1 - 1.0 K/uL   Eosinophils Relative 4  0 - 5 %   Eosinophils Absolute 0.4  0.0 - 0.7 K/uL   Basophils Relative 0  0 - 1 %   Basophils Absolute 0.0  0.0 - 0.1 K/uL  BASIC METABOLIC PANEL     Status: Abnormal   Collection Time    07/22/14 10:45 AM      Result Value Ref Range   Sodium 139  137 - 147 mEq/L   Potassium 3.2 (*) 3.7 - 5.3 mEq/L   Chloride 95 (*) 96 - 112 mEq/L   CO2 27  19 - 32 mEq/L   Glucose, Bld 118 (*) 70 - 99 mg/dL   BUN 22  6 - 23 mg/dL   Creatinine, Ser 3.18 (*) 0.50 - 1.10 mg/dL   Calcium 9.2  8.4 - 10.5 mg/dL   GFR calc non Af Amer 13 (*) >90 mL/min   GFR calc Af Amer 15 (*) >90 mL/min   Comment: (NOTE)     The eGFR has been calculated using the CKD EPI equation.     This calculation has not been validated in all clinical situations.     eGFR's persistently <90 mL/min signify possible Chronic Kidney     Disease.   Anion gap 17 (*) 5 - 15  CBG MONITORING, ED     Status: Abnormal   Collection Time    07/22/14 12:03 PM      Result Value Ref Range   Glucose-Capillary 123 (*) 70 - 99 mg/dL  TROPONIN I     Status: None    Collection Time    07/22/14  3:02 PM      Result Value Ref Range   Troponin I <0.30  <0.30 ng/mL   Comment:            Due to the release kinetics of cTnI,     a negative result within the first hours     of the onset of symptoms does not rule out     myocardial infarction with certainty.     If myocardial infarction is still suspected,     repeat the test at appropriate intervals.  PROTIME-INR     Status: None   Collection Time    07/22/14  3:02 PM      Result Value Ref Range   Prothrombin Time 13.3  11.6 - 15.2 seconds   INR 1.01  0.00 - 1.49  GLUCOSE, CAPILLARY     Status: Abnormal   Collection Time    07/22/14  3:08 PM      Result Value Ref Range   Glucose-Capillary 103 (*) 70 - 99 mg/dL  TYPE AND SCREEN  Status: None   Collection Time    07/22/14  3:24 PM      Result Value Ref Range   ABO/RH(D) A POS     Antibody Screen NEG     Sample Expiration 07/25/2014    ABO/RH     Status: None   Collection Time    07/22/14  3:24 PM      Result Value Ref Range   ABO/RH(D) A POS    GLUCOSE, CAPILLARY     Status: None   Collection Time    07/22/14  4:50 PM      Result Value Ref Range   Glucose-Capillary 98  70 - 99 mg/dL  GLUCOSE, CAPILLARY     Status: Abnormal   Collection Time    07/22/14  8:58 PM      Result Value Ref Range   Glucose-Capillary 144 (*) 70 - 99 mg/dL  CBC     Status: Abnormal   Collection Time    07/23/14  7:20 AM      Result Value Ref Range   WBC 6.9  4.0 - 10.5 K/uL   RBC 2.45 (*) 3.87 - 5.11 MIL/uL   Hemoglobin 8.7 (*) 12.0 - 15.0 g/dL   Comment: REPEATED TO VERIFY   HCT 25.2 (*) 36.0 - 46.0 %   MCV 102.9 (*) 78.0 - 100.0 fL   MCH 35.5 (*) 26.0 - 34.0 pg   MCHC 34.5  30.0 - 36.0 g/dL   RDW 13.6  11.5 - 15.5 %   Platelets 129 (*) 150 - 400 K/uL  BASIC METABOLIC PANEL     Status: Abnormal   Collection Time    07/23/14  7:20 AM      Result Value Ref Range   Sodium 137  137 - 147 mEq/L   Potassium 3.7  3.7 - 5.3 mEq/L   Chloride 96  96 -  112 mEq/L   CO2 27  19 - 32 mEq/L   Glucose, Bld 218 (*) 70 - 99 mg/dL   BUN 31 (*) 6 - 23 mg/dL   Creatinine, Ser 4.03 (*) 0.50 - 1.10 mg/dL   Calcium 8.5  8.4 - 10.5 mg/dL   GFR calc non Af Amer 10 (*) >90 mL/min   GFR calc Af Amer 11 (*) >90 mL/min   Comment: (NOTE)     The eGFR has been calculated using the CKD EPI equation.     This calculation has not been validated in all clinical situations.     eGFR's persistently <90 mL/min signify possible Chronic Kidney     Disease.   Anion gap 14  5 - 15  GLUCOSE, CAPILLARY     Status: Abnormal   Collection Time    07/23/14  9:09 AM      Result Value Ref Range   Glucose-Capillary 148 (*) 70 - 99 mg/dL  GLUCOSE, CAPILLARY     Status: Abnormal   Collection Time    07/23/14 11:59 AM      Result Value Ref Range   Glucose-Capillary 141 (*) 70 - 99 mg/dL    Dg Chest 2 View  07/22/2014   CLINICAL DATA:  Hip fracture.  Fall.  EXAM: CHEST  2 VIEW  COMPARISON:  12/21/2013  FINDINGS: Dialysis catheter removed. Mild cardiomegaly. Vascular congestion. Lungs hyper aerated and clear. No pneumothorax. No rib fracture.  IMPRESSION: Cardiomegaly without edema.  Hyperaeration.   Electronically Signed   By: Maryclare Bean M.D.   On: 07/22/2014 12:25  Dg Hip Complete Left  07/22/2014   CLINICAL DATA:  LEFT hip pain, fell this morning  EXAM: LEFT HIP - COMPLETE 2+ VIEW  COMPARISON:  04/16/2008, 04/15/2008, CT LEFT hip 04/15/2008  FINDINGS: Marked osseous demineralization.  Three cannulated screws present at proximal LEFT femur post pinning of a LEFT femoral neck fracture.  Newly identified LEFT pelvic fractures including a displaced fracture through the superomedial acetabulum and a minimally displaced fracture through the LEFT pubic body.  Proximal LEFT femur appears intact.  RIGHT pelvis appears intact.  Scattered atherosclerotic calcifications pelvic phleboliths.  Extensive degenerative disc and facet disease changes lower lumbar spine.  IMPRESSION: Prior pinning  of the LEFT proximal femur.  Marked osseous demineralization.  New displaced LEFT pelvic fractures including LEFT acetabulum and LEFT pubic body.   Electronically Signed   By: Lavonia Dana M.D.   On: 07/22/2014 12:24   Dg Femur Left  07/22/2014   CLINICAL DATA:  Fall  EXAM: LEFT FEMUR - 2 VIEW  COMPARISON:  Hip films same day  FINDINGS: No fracture or dislocation of the distal left femur. Joint space narrowing of the knee joint.  IMPRESSION: No distal left femur fracture   Electronically Signed   By: Suzy Bouchard M.D.   On: 07/22/2014 12:24   Ct Head Wo Contrast  07/22/2014   CLINICAL DATA:  Fall.  Left hip pain.  EXAM: CT HEAD WITHOUT CONTRAST  TECHNIQUE: Contiguous axial images were obtained from the base of the skull through the vertex without intravenous contrast.  COMPARISON:  12/21/2013  FINDINGS: Chronic ischemic changes and atrophy are stable. No mass effect, midline shift, or acute intracranial hemorrhage. Right globe prosthesis stable. No skull fracture. Stable appearance of the odontoid.  IMPRESSION: No acute intracranial pathology.  Chronic changes.   Electronically Signed   By: Maryclare Bean M.D.   On: 07/22/2014 11:28   Ct Pelvis Wo Contrast  07/23/2014   CLINICAL DATA:  Left acetabular fracture.  Preoperative planning.  EXAM: CT PELVIS WITHOUT CONTRAST, CT 3D RECON AT SCANNER  TECHNIQUE: Multidetector CT imaging of the pelvis was performed following the standard protocol without intravenous contrast.  3-dimensional CT images were rendered by post-processing of the original CT data at the CT scanner. The 3-dimensional CT images were interpreted, and findings were reported in the accompanying complete CT report for this study.  COMPARISON:  Left hip and pelvic radiographs 01/23/2014 and 07/22/2014.  FINDINGS: The bones are diffusely demineralized. The patient has undergone previous pinning of a subcapital fracture of the left femoral neck. This fracture appears healed with mild residual  posttraumatic deformity. There is no evidence of acute proximal femur fracture or dislocation.  As demonstrated radiographically, there is an extensively comminuted and displaced fracture of the left acetabulum. This fracture involves the acetabular roof and anterior column, resulting in up to 3 cm of medial and 2.4 cm of anterior displacement. The posterior wall of the acetabulum is intact. There is no dislocation.  There are mildly displaced acute fractures of the left superior and inferior pubic rami. Underlying posttraumatic deformities of the left pubic rami are noted.  The sacrum, sacroiliac joints and symphysis pubis are intact. No right-sided pelvic fractures are demonstrated. Diffuse degenerative changes are present throughout the lower lumbar spine.  There is a moderate size left pelvic hematoma involving the iliacus muscle. There is mild subcutaneous edema throughout the pelvis. Multiple vascular calcifications in uterine fibroids are noted.  IMPRESSION: Moderately displaced and comminuted fracture of the left acetabulum  as described. Associated mildly displaced fractures of the left superior and inferior pubic rami. The sacroiliac joints are intact.   Electronically Signed   By: Camie Patience M.D.   On: 07/23/2014 13:50   Dg Pelvis Comp Min 3v  07/23/2014   CLINICAL DATA:  LEFT acetabular fracture.  EXAM: JUDET PELVIS - 3+ VIEW  COMPARISON:  07/22/2014.  FINDINGS: There is a displaced LEFT acetabular fracture. Displacement of the LEFT iliac pubic line measures 2.8 cm. Complementary LEFT obturator ring fractures are present. Acetabular incongruity is about 8 mm on the to day views. Patient is osteopenic. Given the osteopenia, a noncontrast CT would probably be useful for preoperative planning in this patient with displaced LEFT pelvic ring fractures.  IMPRESSION: LEFT acetabular disruption with displaced LEFT pelvic ring fractures. Given the osteopenia, a noncontrast CT should be considered to further  assess.   Electronically Signed   By: Dereck Ligas M.D.   On: 07/23/2014 13:00   Ct 3d Recon At Scanner  07/23/2014   CLINICAL DATA:  Left acetabular fracture.  Preoperative planning.  EXAM: CT PELVIS WITHOUT CONTRAST, CT 3D RECON AT SCANNER  TECHNIQUE: Multidetector CT imaging of the pelvis was performed following the standard protocol without intravenous contrast.  3-dimensional CT images were rendered by post-processing of the original CT data at the CT scanner. The 3-dimensional CT images were interpreted, and findings were reported in the accompanying complete CT report for this study.  COMPARISON:  Left hip and pelvic radiographs 01/23/2014 and 07/22/2014.  FINDINGS: The bones are diffusely demineralized. The patient has undergone previous pinning of a subcapital fracture of the left femoral neck. This fracture appears healed with mild residual posttraumatic deformity. There is no evidence of acute proximal femur fracture or dislocation.  As demonstrated radiographically, there is an extensively comminuted and displaced fracture of the left acetabulum. This fracture involves the acetabular roof and anterior column, resulting in up to 3 cm of medial and 2.4 cm of anterior displacement. The posterior wall of the acetabulum is intact. There is no dislocation.  There are mildly displaced acute fractures of the left superior and inferior pubic rami. Underlying posttraumatic deformities of the left pubic rami are noted.  The sacrum, sacroiliac joints and symphysis pubis are intact. No right-sided pelvic fractures are demonstrated. Diffuse degenerative changes are present throughout the lower lumbar spine.  There is a moderate size left pelvic hematoma involving the iliacus muscle. There is mild subcutaneous edema throughout the pelvis. Multiple vascular calcifications in uterine fibroids are noted.  IMPRESSION: Moderately displaced and comminuted fracture of the left acetabulum as described. Associated mildly  displaced fractures of the left superior and inferior pubic rami. The sacroiliac joints are intact.   Electronically Signed   By: Camie Patience M.D.   On: 07/23/2014 13:50    Review of Systems  Constitutional: Negative for fever and chills.  Respiratory: Negative for shortness of breath and wheezing.   Cardiovascular: Negative for chest pain and palpitations.  Gastrointestinal: Negative for nausea, vomiting and abdominal pain.  Genitourinary: Negative for dysuria.  Musculoskeletal:       Left hip and left wrist pain  Neurological: Negative for tingling, sensory change and headaches.  Endo/Heme/Allergies: Bruises/bleeds easily.   Blood pressure 154/31, pulse 59, temperature 98.6 F (37 C), temperature source Oral, resp. rate 14, height _0  (1.626 m), weight 50.1 kg (110 lb 7.2 oz), SpO2 99.00%. Physical Exam  Constitutional: She appears cachectic. She is cooperative. No distress.  HENT:  Head:  Normocephalic and atraumatic.  Cardiovascular:  S1 and S2, regular  Respiratory:  Clear anterior fields  GI:  Soft, nontender, nondistended,+ bowel sounds  Musculoskeletal:  Pelvis and left lower extremity Inspection:   No gross deformities   Skin changes consistent with peripheral vascular disease distally   No open wounds or lesions Bony eval:   Foot, ankle, lower leg, knee are without pain on palpation   Discomfort with palpation of her left hip Soft tissue:   Soft tissue is unremarkable. No ecchymosis or wounds noted ROM:   Good range of motion of her ankle. I did not have the patient perform knee or hip range of motion Sensation:   DPN, SPN, TN sensory functions are grossly intact. Femoral nerve sensory functions grossly intact as well Motor:   EHL, FHL, antibiotics, posterior tibialis, peroneals and gastrocsoleus complex motor function are grossly intact Vascular:   Unable to palpate dorsalis pedis or posterior tibialis pulses   Extremity is warm   Compartments are  soft  Left upper extremity Inspection:   No deformities Bony eval:   Very mild tenderness to her left distal radius   No pain with palpation to her forearm, elbow, humerus, shoulder Soft tissue:   Soft tissues unremarkable no significant swelling is noted about her wrist   Elbow and shoulder unremarkable ROM:   The patient demonstrates good active motion of her wrist, forearm, elbow and shoulder Sensation:   Radial, ulnar, median nerve sensory functions intact. Axillary nerve sensory functions intact  Motor:   Radial, ulnar, median and axillary nerve motor functions intact Vascular:   palpable radial pulse. Extremity is warm   Neurological: She is alert.    Assessment/Plan:  79 year old female status post ground level fall   1. Ground-level fall  2. Complex left acetabulum fracture, both column/anterior column-posterior hemi-transverse with medialization of the quadrilateral plate   Ideally this would be best treated surgically with open reduction internal fixation utilizing a Stoppa approach which is an intrapelvic approach and is quite physiologically taxing on patient with the potential for moderate blood loss   however given all of the patient's medical comorbidities including vascular, endocrine, hematologic (acute thrombocytopenia) and renal as well as her limited activity precludes surgical intervention. We feel that the risk of surgery is greater than the benefits. And has severe morbidity and mortality associated with surgery were quite evident with this particular patient.   As such we will proceed with nonoperative management.  Patient will be bed to chair for the next 8-12 weeks. She will definitely be bed to chair for the next 8 weeks for sure and then may begin some graduated weightbearing on her left leg after that time. Should the patient bear weight on her left leg this may lead to catastrophic disruption of her fracture fragments.    we will have the patient  evaluated by physical therapy tomorrow to begin some mobilization. Bed to chair transfers should either be slide or lift transfers only during the beginning phases. Patient may then progress to stand pivot transfers on her right leg.   Dr. handy will attempt to contact the patient's son to discuss the case with him as well. We will also reconvene with the family tomorrow to discuss treatment plan further  3. left wrist pain   X-rays   4. medical issues   Continue per medical service   5. dispo  Therapy evaluations   Patient will likely need a rehabilitation facility at discharge   Jari Pigg,  PA-C [facility] [phone] (P) 07/23/2014, 3:59 PM

## 2014-07-23 NOTE — Progress Notes (Signed)
Pt requests discontinue bucks traction paged Dr. Renae FicklePaul, V/O ok to discontinue traction will be up to see patient shortly.

## 2014-07-23 NOTE — Progress Notes (Signed)
X-ray called and stated they had to move pt with traction, advised needed to call ortho tech for traction.  X-ray stated they would call the surgeon to advise.

## 2014-07-23 NOTE — Progress Notes (Signed)
Pt gone down for CT of plevis and 3V DG via bed with bucks traction

## 2014-07-23 NOTE — Progress Notes (Signed)
Paged Dr. Isidoro Donningai, NPO discontinued for a diet order.

## 2014-07-23 NOTE — Progress Notes (Signed)
INITIAL NUTRITION ASSESSMENT  Pt meets criteria for SEVERE MALNUTRITION in the context of chronic illness as evidenced by energy intake </= 75% for >/= 1 month and severe muscle mass depletion.  DOCUMENTATION CODES Per approved criteria  -Severe malnutrition in the context of chronic illness   INTERVENTION: Provide Resource Breeze po BID, each supplement provides 250 kcal and 9 grams of protein.  Encourage PO intake.  NUTRITION DIAGNOSIS: Increased nutrient needs related to chronic illness, ESRD as evidenced by estimated nutrition needs.   Goal: Pt to meet >/= 90% of their estimated nutrition needs.  Monitor:  PO intake, weight trends, labs, I/O's  Reason for Assessment: MD Consult for assessment of nutrition requirements/status  79 y.o. female  Admitting Dx: Acetabulum fracture, left  ASSESSMENT: Pt with PMH of diastolic heart failure, DM, and end-stage renal disease on dialysis who today tripped while trying to put on her shoes and fell landing on her left side. She started having severe pain and was unable to stand up. X-rays were done noting several pelvic fractures including the left acetabulum.  Pt anticipated surgery tomorrow 8/18.  Pt reports she has a decreased appetite, which has been present since last year. Pt reports she has been losing weight from not eating as much as she used to. Pt reports her usual body weight of 140 lbs, which reports last weighing last year. Pt reports lately that for breakfast, she has been eating a breakfast sandwich, then usually a snack or nothing at lunch, and then a small meal for dinner that contains a protein, starch, and veggies. Pt reports she has tried Nepro before and does not like it. Pt is willing to try alternative oral supplements. Pt is open to [employer] Breeze. Will order. Pt was educated to increase her calorie and protein needs, which she may do from eating her food at meals and drinking oral supplements at home. Pt expressed  understanding.  Nutrition Focused Physical Exam: (Pt reports she is in pain, only partially completed her physical exam)  Subcutaneous Fat:  Orbital Region: N/A Upper Arm Region: N/A Thoracic and Lumbar Region: N/A  Muscle:  Temple Region: Moderate depletion Clavicle Bone Region: Severe depletion Clavicle and Acromion Bone Region: Severe depletion Scapular Bone Region: N/A Dorsal Hand: N/A Patellar Region: N/A Anterior Thigh Region: N/A Posterior Calf Region: N/A  Edema: none  Labs: High BUN, creatinine, and Glucose (218 mg/dL). Low GFR.  Sodium and potassium are WNL.  Height: Ht Readings from Last 1 Encounters:  07/23/14 5\' 4"  (1.626 m)    Weight: Wt Readings from Last 1 Encounters:  07/23/14 110 lb 7.2 oz (50.1 kg)   Ideal Body Weight: 120 lbs  % Ideal Body Weight: 92%  Wt Readings from Last 10 Encounters:  07/23/14 110 lb 7.2 oz (50.1 kg)  05/22/14 109 lb 12.8 oz (49.805 kg)  12/26/13 122 lb 9.6 oz (55.611 kg)  11/10/13 129 lb 10.1 oz (58.8 kg)  11/10/13 129 lb 10.1 oz (58.8 kg)  11/10/13 129 lb 10.1 oz (58.8 kg)  10/17/13 146 lb (66.225 kg)  10/02/13 140 lb (63.504 kg)  08/31/13 128 lb (58.06 kg)  08/22/13 130 lb 11.7 oz (59.3 kg)    Usual Body Weight: 130 lbs  % Usual Body Weight: 85%  BMI:  Body mass index is 18.95 kg/(m^2).  Estimated Nutritional Needs: Kcal: 1600-1800 Protein: 75-85 grams Fluid: 1.2 L/day   Skin: no issues noted  Diet Order: Renal  With 1200 ml fluids  EDUCATION NEEDS: -Education needs  addressed   Intake/Output Summary (Last 24 hours) at 07/23/14 1346 Last data filed at 07/23/14 1110  Gross per 24 hour  Intake    240 ml  Output   2400 ml  Net  -2160 ml    Last BM: 8/15   Labs:   Recent Labs Lab 07/22/14 1045 07/23/14 0720  NA 139 137  K 3.2* 3.7  CL 95* 96  CO2 27 27  BUN 22 31*  CREATININE 3.18* 4.03*  CALCIUM 9.2 8.5  GLUCOSE 118* 218*    CBG (last 3)   Recent Labs  07/22/14 2058  07/23/14 0909 07/23/14 1159  GLUCAP 144* 148* 141*    Scheduled Meds: . amLODipine  10 mg Oral Daily  . atorvastatin  20 mg Oral q1800  . carvedilol  12.5 mg Oral BID WC  . cloNIDine  0.1 mg Oral Q2200  . docusate sodium  100 mg Oral BID  . hydrALAZINE  100 mg Oral BID  . insulin aspart  0-9 Units Subcutaneous TID WC  . insulin glargine  10 Units Subcutaneous QHS  . isosorbide mononitrate  20 mg Oral BID  . levothyroxine  88 mcg Oral QAC breakfast  . morphine      . multivitamin  1 tablet Oral QHS  . pantoprazole  40 mg Oral Daily    Continuous Infusions: . sodium chloride 20 mL (07/22/14 1315)    Past Medical History  Diagnosis Date  . Retinopathy   . Hypertension   . Chronic diastolic CHF (congestive heart failure)     has a normal EF per echo 03/2011 & 04/2012 with diastolic dysfunction  . CAD (coronary artery disease)     Prior PCI in 1998 x 2; s/p cath in 2001, negative Myoview in 2008 & 2011, s/p cath in June 2012 with calcified disease of the proximal LCX with normal flow reserve. She has been managed medically due to her other morbidities  . Cerebrovascular disease   . Peripheral vascular disease     prior angiography showing RSFA stenosis, left anterior tibial and left posterior tibial stenoses  . Hypercholesterolemia   . Diabetes mellitus     insulin dependent  . Hypothyroidism   . Hiatal hernia   . Degenerative joint disease   . Osteoporosis   . Vitamin D deficiency   . Anemia of chronic disease   . ESRD (end stage renal disease)     a. CKD progressed to ESRD 10/2013. b. s/p LUE basilic transposition by vascular on 11/08/13.  . Dialysis patient     Paulo Fruit, and Friday    Past Surgical History  Procedure Laterality Date  . Abdominal aortogram  07/27/2003    by Dr. Chales Abrahams  . Bilateral lower extremity angiogram  07/27/2003    by Dr. Chales Abrahams  . Selective angiography,right superficial femoral artery, right and left iliac arteries  07/27/2003    by Dr. Chales Abrahams   . Measurement of gradient right and left iliac arteries  07/27/2003    by Dr. Chales Abrahams  . Bilateral cataract sugery  2000 and 2002    by Dr. Cecilie Kicks  . Repair left hip fracture  04/2008    by Dr. Charlann Boxer  . Bascilic vein transposition Left 11/08/2013    Procedure: BASCILIC VEIN TRANSPOSITION;  Surgeon: Pryor Ochoa, MD;  Location: [facility] OR;  Service: Vascular;  Laterality: Left;  Marland Kitchen Eye surgery      Vision loss in Right Eye    Marijean Niemann, MS, Provisional LDN Pager #  [phone] After hours/ weekend pager # [phone]

## 2014-07-23 NOTE — Procedures (Signed)
I was present at this dialysis session. I have reviewed the session itself and made appropriate changes.   In some pain.  Hb down from admit in 11s to 8.7.  Will repeat.RS  Sabra Heckyan Quintavius Niebuhr  MD 07/23/2014, 9:09 AM

## 2014-07-23 NOTE — Progress Notes (Signed)
Paged Dr. Renae FicklePaul surgeon to clarify no surgery for today. Dr. Renae FicklePaul returned page no surgery for today, give renal carb mod 1200 flu rest diet.

## 2014-07-23 NOTE — Progress Notes (Signed)
Paged Dr. Isidoro Donningai advised no surgery today.  Dr. Renae FicklePaul will eval tests and gave diet order.

## 2014-07-24 LAB — BASIC METABOLIC PANEL
Anion gap: 15 (ref 5–15)
BUN: 23 mg/dL (ref 6–23)
CALCIUM: 9 mg/dL (ref 8.4–10.5)
CO2: 28 meq/L (ref 19–32)
CREATININE: 3.35 mg/dL — AB (ref 0.50–1.10)
Chloride: 94 mEq/L — ABNORMAL LOW (ref 96–112)
GFR calc Af Amer: 14 mL/min — ABNORMAL LOW (ref >90)
GFR calc non Af Amer: 12 mL/min — ABNORMAL LOW (ref >90)
GLUCOSE: 101 mg/dL — AB (ref 70–99)
Potassium: 3.6 mEq/L — ABNORMAL LOW (ref 3.7–5.3)
Sodium: 137 mEq/L (ref 137–147)

## 2014-07-24 LAB — GLUCOSE, CAPILLARY
GLUCOSE-CAPILLARY: 109 mg/dL — AB (ref 70–99)
Glucose-Capillary: 167 mg/dL — ABNORMAL HIGH (ref 70–99)
Glucose-Capillary: 320 mg/dL — ABNORMAL HIGH (ref 70–99)
Glucose-Capillary: 226 mg/dL — ABNORMAL HIGH (ref 70–99)

## 2014-07-24 LAB — CBC
HCT: 30.2 % — ABNORMAL LOW (ref 36.0–46.0)
HEMOGLOBIN: 10.3 g/dL — AB (ref 12.0–15.0)
MCH: 35.4 pg — AB (ref 26.0–34.0)
MCHC: 34.1 g/dL (ref 30.0–36.0)
MCV: 103.8 fL — AB (ref 78.0–100.0)
Platelets: 156 10*3/uL (ref 150–400)
RBC: 2.91 MIL/uL — ABNORMAL LOW (ref 3.87–5.11)
RDW: 13.6 % (ref 11.5–15.5)
WBC: 9.2 10*3/uL (ref 4.0–10.5)

## 2014-07-24 LAB — HEMOGLOBIN A1C
Hgb A1c MFr Bld: 8.6 % — ABNORMAL HIGH (ref <5.7)
Mean Plasma Glucose: 200 mg/dL — ABNORMAL HIGH (ref <117)

## 2014-07-24 MED ORDER — HEPARIN SODIUM (PORCINE) 5000 UNIT/ML IJ SOLN
5000.0000 [IU] | Freq: Three times a day (TID) | INTRAMUSCULAR | Status: DC
  Administered 2014-07-24 – 2014-07-26 (×8): 5000 [IU] via SUBCUTANEOUS
  Filled 2014-07-24 (×6): qty 1

## 2014-07-24 MED ORDER — DARBEPOETIN ALFA-POLYSORBATE 25 MCG/0.42ML IJ SOLN
25.0000 ug | INTRAMUSCULAR | Status: DC
  Administered 2014-07-25: 25 ug via INTRAVENOUS
  Filled 2014-07-24: qty 0.42

## 2014-07-24 NOTE — Evaluation (Signed)
Occupational Therapy Evaluation Patient Details Name: Nicole [name] MRN: [id number] DOB: March 28, 1935 Today's Date: 07/24/2014    History of Present Illness Admitted post fall resulting in complex L acetabular fx; non-operative management; strict NWB LLE; Per Ortho-Trauma: Should the patient bear weight on her left leg this may lead to catastrophic disruption of her fracture fragments.   Clinical Impression   Pt with decreased functional mobility and selfcare at this time.  Supervision to min assist for sitting EOB and performing grooming and bathing tasks.  Needs max to total assist for LB selfcare supine to sit secondary to pain and weightbearing precautions.  Feel pt will benefit from acute care OT to help increase overall independence but she will need SNF for extended rehab as she has no assistance post discharge.    Follow Up Recommendations  SNF;Supervision/Assistance - 24 hour    Equipment Recommendations  None recommended by OT (TBD at next venue of care)       Precautions / Restrictions Precautions Precautions: Fall Precaution Comments: Per Ortho-Tar[place]: Bed to chair transfers should either be slide or lift transfers only during the beginning phases. Patient may then progress to stand pivot transfers on her right leg. Restrictions Weight Bearing Restrictions: Yes LLE Weight Bearing: Non weight bearing      Mobility Bed Mobility Overal bed mobility: Needs Assistance Bed Mobility: Supine to Sit     Supine to sit: Total assist     General bed mobility comments: Pt able to move her RLE over the edge of the bed but needed assistance with all other aspects.  Transfers Overall transfer level: Needs assistance Equipment used: None Transfers: Lateral/Scoot Transfers          Lateral/Scoot Transfers: Max assist General transfer comment: Cues for technique; Pt did reach for far armrest in an attempt to pull self over; Still, required total assist and gentle R knee  block to stabilize during lateral scoot to the chair; Nurse tech (and trainee) in the room and very helpful to stabilize recliner (though it was locked)    Balance Overall balance assessment: Needs assistance Sitting-balance support: Bilateral upper extremity supported Sitting balance-Leahy Scale: Poor Sitting balance - Comments: right lean in sitting                                    ADL Overall ADL's : Needs assistance/impaired Eating/Feeding: Independent;Sitting   Grooming: Wash/dry hands;Wash/dry face;Set up;Sitting   Upper Body Bathing: Sitting;Minimal assitance Upper Body Bathing Details (indicate cue type and reason): sitting unsupported EOB Lower Body Bathing: Maximal assistance;Sitting/lateral leans;Bed level   Upper Body Dressing : Supervision/safety;Sitting   Lower Body Dressing: Total assistance;Bed level     Toilet Transfer Details (indicate cue type and reason): Not tested   Toileting - Clothing Manipulation Details (indicate cue type and reason): Not tested     Functional mobility during ADLs: Total assistance General ADL Comments: Pt limited to just transferring from the bed to EOB.  total assist to achieve but once sitting for 5 mins pt became nauseas and vomited.  Pt returned to supine.  Began education on AE for LB selfcare as pt could not reach either foot for dressing or bathing tasks.          Perception Perception Perception Tested?: No   Praxis Praxis Praxis tested?: Not tested    Pertinent Vitals/Pain Pain Assessment: 0-10 Pain Score: 8  Pain Location: left hip Pain  Descriptors / Indicators: Sharp Pain Intervention(s): Repositioned;RN gave pain meds during session (pt vomited up pain meds after given)     Hand Dominance Right   Extremity/Trunk Assessment Upper Extremity Assessment Upper Extremity Assessment: Overall WFL for tasks assessed   Lower Extremity Assessment Lower Extremity Assessment: Defer to PT  evaluation RLE Deficits / Details: Able to actively move RLE, but grossly limited due to pain with any movement RLE: Unable to fully assess due to pain LLE Deficits / Details: Any motion of LLE is pain provoking; Will also hold any ROM assessment or therex until cleared by Ortho-trauma LLE: Unable to fully assess due to pain   Cervical / Trunk Assessment Cervical / Trunk Assessment: Normal   Communication Communication Communication: No difficulties   Cognition Arousal/Alertness: Awake/alert Behavior During Therapy: WFL for tasks assessed/performed Overall Cognitive Status: Within Functional Limits for tasks assessed                                Home Living Family/patient expects to be discharged to:: Skilled nursing facility Living Arrangements: Alone                                      Prior Functioning/Environment Level of Independence: Independent with assistive device(s)        Comments: pt uses cane, still drives and cares for dog    OT Diagnosis: Generalized weakness;Acute pain   OT Problem List: Decreased strength;Decreased activity tolerance;Decreased safety awareness;Pain;Decreased knowledge of use of DME or AE   OT Treatment/Interventions: Self-care/ADL training;Patient/family education;Balance training;Neuromuscular education;Therapeutic activities;DME and/or AE instruction    OT Goals(Current goals can be found in the care plan section) Acute Rehab OT Goals Patient Stated Goal: Pt did not state but agreed to participate in OT session OT Goal Formulation: With patient Time For Goal Achievement: 08/07/14 Potential to Achieve Goals: Good  OT Frequency: Min 2X/week   Barriers to D/C: Decreased caregiver support             End of Session Nurse Communication: Other (comment) (Pt nauseas and vomited)  Activity Tolerance: Patient limited by pain;Other (comment) (Pt also nauseas) Patient left:     Time: [id number] OT Time  Calculation (min): 32 min Charges:  OT General Charges $OT Visit: 1 Procedure OT Evaluation $Initial OT Evaluation Tier I: 1 Procedure OT Treatments $Self Care/Home Management : 23-37 mins G-Codes:    Latesa Fratto OTR/L 07/24/2014, 2:57 PM

## 2014-07-24 NOTE — Progress Notes (Signed)
Patient ID: Nicole SalinaJeanette B [name]  female  WUJ:[id number]    DOB: Aug 09, 1935    DOA: 07/22/2014  PCP: Loreen FreudYvonne Lowne, DO  Assessment/Plan: Principal Problem: Complex left acetabulum fracture, both column/anterior column-posterior hemi-transverse with medialization of the quadrilateral plate,  sec to mechanical fall:  - Seen by orthopedics, recommended nonoperative management for 8-12 weeks, definitely be bed to chair for the next 8 weeks for sure and then may begin some graduated weightbearing on her left leg after that time. Bed to chair transfers should either be slide or lift transfers only during the beginning phases. Patient may then progress to stand pivot transfers on her right leg. - Dr Carola FrostHandy will discuss with family about further treatment plan. - PT to start today with ortho recommendations.  - Pain currently controlled  Active Problems:   DM (diabetes mellitus), type 2 with renal complications: uncontrolled - Continue Lantus and sliding scale insulin, better after starting lantus - further adjustment after observing few more CBG's, check HbA1C     ANEMIA OF CHRONIC DISEASE - Monitor CBC -Hemoglobin down to 8.7, check anemia panel, FOBT - will need transfusion if less than 8      HYPERTENSION -Currently stable     GERD (gastroesophageal reflux disease) -Continue PPI      Chronic diastolic heart failure; stable, compensated  - Volume management by dialysis     ESRD (end stage renal disease) - On hemodialysis, MWF, nephrology consulted     Hypothyroidism - Continue Synthroid, TSH 6/16 was 0.91  DVT Prophylaxis: heparin sq  Code Status:  Family Communication:  Disposition: will definitely rehab/SNF  Consultants:   orthopedics    nephrology  Procedures:   hemodialysis   Antibiotics:   none     Subjective:  patient seen and examined, states 'still hurts when i move', eating fine    Objective: Weight change:   Intake/Output Summary (Last 24 hours) at  07/24/14 0919 Last data filed at 07/23/14 1110  Gross per 24 hour  Intake      0 ml  Output   2400 ml  Net  -2400 ml   Blood pressure 139/36, pulse 58, temperature 99.4 F (37.4 C), temperature source Oral, resp. rate 18, height 5\' 4"  (1.626 m), weight 50.1 kg (110 lb 7.2 oz), SpO2 98.00%.  Physical Exam: General: Alert and awake, oriented x3, NAD CVS: S1-S2 clear, no murmur rubs or gallops Chest: clear to auscultation bilaterally, no wheezing, rales or rhonchi Abdomen: soft nontender, nondistended, normal bowel sounds  Extremities: no cyanosis, clubbing or edema noted bilaterally   Lab Results: Basic Metabolic Panel:  Recent Labs Lab 07/22/14 1045 07/23/14 0720  NA 139 137  K 3.2* 3.7  CL 95* 96  CO2 27 27  GLUCOSE 118* 218*  BUN 22 31*  CREATININE 3.18* 4.03*  CALCIUM 9.2 8.5   Liver Function Tests: No results found for this basename: AST, ALT, ALKPHOS, BILITOT, PROT, ALBUMIN,  in the last 168 hours No results found for this basename: LIPASE, AMYLASE,  in the last 168 hours No results found for this basename: AMMONIA,  in the last 168 hours CBC:  Recent Labs Lab 07/22/14 1045 07/23/14 0720  WBC 9.4 6.9  NEUTROABS 7.4  --   HGB 11.3* 8.7*  HCT 32.4* 25.2*  MCV 103.5* 102.9*  PLT 162 129*   Cardiac Enzymes:  Recent Labs Lab 07/22/14 1502  TROPONINI <0.30   BNP: No components found with this basename: POCBNP,  CBG:  Recent Labs Lab  07/23/14 0909 07/23/14 1159 07/23/14 1609 07/23/14 2041 07/24/14 0733  GLUCAP 148* 141* 305* 202* 109*     Micro Results: No results found for this or any previous visit (from the past 240 hour(s)).  Studies/Results: Dg Chest 2 View  07/22/2014   CLINICAL DATA:  Hip fracture.  Fall.  EXAM: CHEST  2 VIEW  COMPARISON:  12/21/2013  FINDINGS: Dialysis catheter removed. Mild cardiomegaly. Vascular congestion. Lungs hyper aerated and clear. No pneumothorax. No rib fracture.  IMPRESSION: Cardiomegaly without edema.   Hyperaeration.   Electronically Signed   By: Maryclare Bean M.D.   On: 07/22/2014 12:25   Dg Hip Complete Left  07/22/2014   CLINICAL DATA:  LEFT hip pain, fell this morning  EXAM: LEFT HIP - COMPLETE 2+ VIEW  COMPARISON:  04/16/2008, 04/15/2008, CT LEFT hip 04/15/2008  FINDINGS: Marked osseous demineralization.  Three cannulated screws present at proximal LEFT femur post pinning of a LEFT femoral neck fracture.  Newly identified LEFT pelvic fractures including a displaced fracture through the superomedial acetabulum and a minimally displaced fracture through the LEFT pubic body.  Proximal LEFT femur appears intact.  RIGHT pelvis appears intact.  Scattered atherosclerotic calcifications pelvic phleboliths.  Extensive degenerative disc and facet disease changes lower lumbar spine.  IMPRESSION: Prior pinning of the LEFT proximal femur.  Marked osseous demineralization.  New displaced LEFT pelvic fractures including LEFT acetabulum and LEFT pubic body.   Electronically Signed   By: Ulyses Southward M.D.   On: 07/22/2014 12:24   Dg Femur Left  07/22/2014   CLINICAL DATA:  Fall  EXAM: LEFT FEMUR - 2 VIEW  COMPARISON:  Hip films same day  FINDINGS: No fracture or dislocation of the distal left femur. Joint space narrowing of the knee joint.  IMPRESSION: No distal left femur fracture   Electronically Signed   By: Genevive Bi M.D.   On: 07/22/2014 12:24   Ct Head Wo Contrast  07/22/2014   CLINICAL DATA:  Fall.  Left hip pain.  EXAM: CT HEAD WITHOUT CONTRAST  TECHNIQUE: Contiguous axial images were obtained from the base of the skull through the vertex without intravenous contrast.  COMPARISON:  12/21/2013  FINDINGS: Chronic ischemic changes and atrophy are stable. No mass effect, midline shift, or acute intracranial hemorrhage. Right globe prosthesis stable. No skull fracture. Stable appearance of the odontoid.  IMPRESSION: No acute intracranial pathology.  Chronic changes.   Electronically Signed   By: Maryclare Bean M.D.    On: 07/22/2014 11:28    Medications: Scheduled Meds: . amLODipine  10 mg Oral Daily  . atorvastatin  20 mg Oral q1800  . carvedilol  12.5 mg Oral BID WC  . cloNIDine  0.1 mg Oral Q2200  . docusate sodium  100 mg Oral BID  . feeding supplement (RESOURCE BREEZE)  1 Container Oral BID BM  . hydrALAZINE  100 mg Oral BID  . insulin aspart  0-9 Units Subcutaneous TID WC  . insulin glargine  10 Units Subcutaneous QHS  . isosorbide mononitrate  20 mg Oral BID  . levothyroxine  88 mcg Oral QAC breakfast  . multivitamin  1 tablet Oral QHS  . pantoprazole  40 mg Oral Daily      LOS: 2 days   RAI,RIPUDEEP M.D. [facility] 07/24/2014, 9:19 AM Pager: [phone]  If 7PM-7AM, please contact night-coverage [email or web address] Password [email or web address]  **Disclaimer: This note was dictated with voice recognition software. Similar sounding words can inadvertently be transcribed and this note may contain  transcription errors which may not have been corrected upon publication of note.**

## 2014-07-24 NOTE — Progress Notes (Signed)
Admit: 07/22/2014 LOS: 2  8[facility] s/p fall w/ L transverse acetabular fracture; not a surgical candidate currently; plan for at least 8wk bed/chair restrictions  Subjective:  Plan for no surgical repair decided upon No new issues Sitting in chair   08/17 0701 - 08/18 0700 In: -  Out: 2400   Filed Weights   07/23/14 0710 07/23/14 1110  Weight: 52 kg (114 lb 10.2 oz) 50.1 kg (110 lb 7.2 oz)    Current meds: reviewed  Current Labs: reviewed   Outpt HD Orders Unit: [facility] Days: MWF Time: 3h3145min Dialyzer: F180 EDW: 46.5kg K/Ca: 2/2.25 Access: AVF Needle Size: 16g BFR: 350 UF Proflie: none VDRA: none EPO: Aranesp 25 qWk IV Fe: none Heparin: 2000 IVP qTx   Physical Exam:  Blood pressure 139/36, pulse 58, temperature 99.4 F (37.4 C), temperature source Oral, resp. rate 18, height 5\' 4"  (1.626 m), weight 50.1 kg (110 lb 7.2 oz), SpO2 98.00%. NAD, in chair RRR CTAB S/nt/nd No LEE No rashes Nonfocaol, aaox3  Assessment/Plan 1. ESRD MWF: keep on schedule via AVF.  Will need to sort out transportation and transfer into HD unit and chair before able to go to outpt center.  Was independent, likely now to need SNF.  Tight heparin for next treatment.  2. L transverse acetablular fracture: nonoperative mgmt per orthopedics 3. HTN/Vol: Keep at 46.5kg EDW, but will be challengint to assess 4. CKDBMD: not on VDRA or cinacalcet.  Recent PTH, Phos acceptable as outpt.  5. Anemia of CKD: cont Aranesp 25 qWed.  Iron replete based on outpt labs.  Sabra Heckyan Nancyjo Givhan MD 07/24/2014, 10:04 AM   Recent Labs Lab 07/22/14 1045 07/23/14 0720  NA 139 137  K 3.2* 3.7  CL 95* 96  CO2 27 27  GLUCOSE 118* 218*  BUN 22 31*  CREATININE 3.18* 4.03*  CALCIUM 9.2 8.5    Recent Labs Lab 07/22/14 1045 07/23/14 0720  WBC 9.4 6.9  NEUTROABS 7.4  --   HGB 11.3* 8.7*  HCT 32.4* 25.2*  MCV 103.5* 102.9*  PLT 162 129*

## 2014-07-24 NOTE — Evaluation (Signed)
Physical Therapy Evaluation Patient Details Name: Nicole [name] MRN: [id number] DOB: March 31, 1935 Today's Date: 07/24/2014   History of Present Illness  Admitted post fall resulting in complex L acetabular fx; non-operative management; strict NWB LLE; Per Ortho-Trauma: Should the patient bear weight on her left leg this may lead to catastrophic disruption of her fracture fragments.  Clinical Impression  Pt admitted with above. Pt currently with functional limitations due to the deficits listed below (see PT Problem List).  Pt will benefit from skilled PT to increase their independence and safety with mobility to allow discharge to the venue listed below.       Follow Up Recommendations SNF    Equipment Recommendations  Wheelchair (measurements PT);Wheelchair cushion (measurements PT) (sliding board)    Recommendations for Other Services       Precautions / Restrictions Precautions Precautions: Fall (though I do not feel that the pt would try to get up by hers) Precaution Comments: Per Ortho-Tar[place]: Bed to chair transfers should either be slide or lift transfers only during the beginning phases. Patient may then progress to stand pivot transfers on her right leg. Restrictions Weight Bearing Restrictions: Yes LLE Weight Bearing: Non weight bearing      Mobility  Bed Mobility Overal bed mobility: Needs Assistance Bed Mobility: Supine to Sit     Supine to sit: Total assist     General bed mobility comments: Used bed pad to scoot hips to Right side of bed in prep for getting up; Attempted to use RLE to half bridge to EOB, however too painful; Tot assist to raise trunk to sitting position once feet were clear of bed  Transfers Overall transfer level: Needs assistance Equipment used: None Transfers: Lateral/Scoot Transfers          Lateral/Scoot Transfers: Max assist General transfer comment: Cues for technique; Pt did reach for far armrest in an attempt to pull self  over; Still, required total assist and gentle R knee block to stabilize during lateral scoot to the chair; Nurse tech (and trainee) in the room and very helpful to stabilize recliner (though it was locked)  Ambulation/Gait                Stairs            Wheelchair Mobility    Modified Rankin (Stroke Patients Only)       Balance   Sitting-balance support: Bilateral upper extremity supported Sitting balance-Leahy Scale: Poor Sitting balance - Comments: Quite an antalgic L lean                                     Pertinent Vitals/Pain Pain Assessment: 0-10 Pain Score: 8  (with movement; once settled in chair, none) Pain Location: Lhip Pain Descriptors / Indicators: Sharp Pain Intervention(s): Monitored during session;Repositioned;RN gave pain meds during session    Home Living Family/patient expects to be discharged to:: Skilled nursing facility Living Arrangements: Alone                    Prior Function Level of Independence: Independent with assistive device(s)         Comments: pt uses cane, still drives and cares for dog     Hand Dominance   Dominant Hand: Right    Extremity/Trunk Assessment   Upper Extremity Assessment: Defer to OT evaluation           Lower Extremity  Assessment: RLE deficits/detail;LLE deficits/detail RLE Deficits / Details: Able to actively move RLE, but grossly limited due to pain with any movement LLE Deficits / Details: Any motion of LLE is pain provoking; Will also hold any ROM assessment or therex until cleared by Ortho-trauma  Cervical / Trunk Assessment: Normal  Communication   Communication: No difficulties  Cognition Arousal/Alertness: Awake/alert Behavior During Therapy: WFL for tasks assessed/performed Overall Cognitive Status: Within Functional Limits for tasks assessed                      General Comments      Exercises        Assessment/Plan    PT Assessment  Patient needs continued PT services  PT Diagnosis Acute pain   PT Problem List Decreased strength;Decreased range of motion;Decreased activity tolerance;Decreased balance;Decreased mobility;Decreased knowledge of use of DME;Decreased knowledge of precautions;Pain  PT Treatment Interventions DME instruction;Functional mobility training;Therapeutic activities;Therapeutic exercise;Balance training;Patient/family education;Wheelchair mobility training   PT Goals (Current goals can be found in the Care Plan section) Acute Rehab PT Goals Patient Stated Goal: wants pain to end PT Goal Formulation: With patient Time For Goal Achievement: 08/07/14 Potential to Achieve Goals: Good    Frequency Min 3X/week   Barriers to discharge Decreased caregiver support      Co-evaluation               End of Session Equipment Utilized During Treatment:  (bed pad) Activity Tolerance: Patient limited by pain Patient left: in chair;with call bell/phone within reach;with chair alarm set;with nursing/sitter in room Nurse Communication: Mobility status         Time: [id number] PT Time Calculation (min): 30 min   Charges:   PT Evaluation $Initial PT Evaluation Tier I: 1 Procedure PT Treatments $Therapeutic Activity: 23-37 mins   PT G Codes:          Nicole [name], Nicole [name] 07/24/2014, 12:15 PM  Nicole [name], PT  Acute Rehabilitation Services Pager [phone] Office [phone]

## 2014-07-24 NOTE — Progress Notes (Signed)
Note and chart reviewed. Agree with assessment.  Ian Malkineanne Barnett RD, LDN Pager: [phone] After Hours Pager: [phone]

## 2014-07-25 DIAGNOSIS — S72009D Fracture of unspecified part of neck of unspecified femur, subsequent encounter for closed fracture with routine healing: Secondary | ICD-10-CM

## 2014-07-25 DIAGNOSIS — I10 Essential (primary) hypertension: Secondary | ICD-10-CM

## 2014-07-25 LAB — GLUCOSE, CAPILLARY
GLUCOSE-CAPILLARY: 237 mg/dL — AB (ref 70–99)
Glucose-Capillary: 120 mg/dL — ABNORMAL HIGH (ref 70–99)
Glucose-Capillary: 137 mg/dL — ABNORMAL HIGH (ref 70–99)
Glucose-Capillary: 288 mg/dL — ABNORMAL HIGH (ref 70–99)

## 2014-07-25 LAB — BASIC METABOLIC PANEL
ANION GAP: 15 (ref 5–15)
BUN: 34 mg/dL — AB (ref 6–23)
CO2: 27 meq/L (ref 19–32)
CREATININE: 5.03 mg/dL — AB (ref 0.50–1.10)
Calcium: 8.4 mg/dL (ref 8.4–10.5)
Chloride: 95 mEq/L — ABNORMAL LOW (ref 96–112)
GFR calc Af Amer: 9 mL/min — ABNORMAL LOW (ref >90)
GFR calc non Af Amer: 8 mL/min — ABNORMAL LOW (ref >90)
Glucose, Bld: 124 mg/dL — ABNORMAL HIGH (ref 70–99)
Potassium: 3.5 mEq/L — ABNORMAL LOW (ref 3.7–5.3)
Sodium: 137 mEq/L (ref 137–147)

## 2014-07-25 LAB — CBC
HEMATOCRIT: 25.7 % — AB (ref 36.0–46.0)
HEMOGLOBIN: 8.8 g/dL — AB (ref 12.0–15.0)
MCH: 35.8 pg — ABNORMAL HIGH (ref 26.0–34.0)
MCHC: 34.2 g/dL (ref 30.0–36.0)
MCV: 104.5 fL — AB (ref 78.0–100.0)
PLATELETS: 132 10*3/uL — AB (ref 150–400)
RBC: 2.46 MIL/uL — ABNORMAL LOW (ref 3.87–5.11)
RDW: 13.7 % (ref 11.5–15.5)
WBC: 7.8 10*3/uL (ref 4.0–10.5)

## 2014-07-25 MED ORDER — MORPHINE SULFATE 2 MG/ML IJ SOLN
INTRAMUSCULAR | Status: AC
  Administered 2014-07-25: 0.5 mg via INTRAVENOUS
  Filled 2014-07-25: qty 1

## 2014-07-25 MED ORDER — INSULIN ASPART 100 UNIT/ML ~~LOC~~ SOLN
3.0000 [IU] | Freq: Three times a day (TID) | SUBCUTANEOUS | Status: DC
  Administered 2014-07-25 – 2014-07-26 (×5): 3 [IU] via SUBCUTANEOUS

## 2014-07-25 MED ORDER — HEPARIN SODIUM (PORCINE) 1000 UNIT/ML DIALYSIS: 20.0000 [IU]/kg | INTRAMUSCULAR | Status: DC | PRN

## 2014-07-25 MED ORDER — DARBEPOETIN ALFA-POLYSORBATE 25 MCG/0.42ML IJ SOLN
INTRAMUSCULAR | Status: AC
  Administered 2014-07-25: 25 ug via INTRAVENOUS
  Filled 2014-07-25: qty 0.42

## 2014-07-25 NOTE — Progress Notes (Addendum)
Patient ID: Nicole [name]  female  ZOX:[id number]    DOB: 07-21-1935    DOA: 07/22/2014  PCP: Loreen FreudYvonne Lowne, DO  Interim history 79 year old female patient with history of chronic diastolic CHF, ESRD on MWF HD, HTN, CAD, hypercholesterolemia, DM, hypothyroid, osteoporosis and vitamin D deficiency, livers alone and ambulates with the help of a cane, tripped at home while trying to put on her shoes and fell on 07/22/14. Evaluation in the ED revealed complex left acetabular fractures and pelvic fractures. Orthopedics recommended an operative management secondary to high risk candidate for surgery.  Assessment/Plan: Principal Problem: Complex left acetabulum fracture, - both column/anterior column-posterior hemi-transverse with medialization of the quadrilateral plate,  sec to mechanical fall:  - Seen by orthopedics, recommended nonoperative management for 8-12 weeks, definitely be bed to chair for the next 8 weeks for sure and then may begin some graduated weightbearing on her left leg after that time. Bed to chair transfers should either be slide or lift transfers only during the beginning phases. Patient may then progress to stand pivot transfers on her right leg. - Dr Carola FrostHandy to discuss with family about further treatment plan. - PT recommends SNF. As per nursing, patient was on a reclining chair for a few hours on 8/18. - Pain currently controlled - Patient will need full assist to get into HD chair as outpatient. Nephrologist has discussed with [facility] and they can accommodate this.  Active Problems:   DM (diabetes mellitus), type 2 with renal complications: uncontrolled - Continue Lantus and sliding scale insulin, better after starting lantus - A1c: 8.6 suggesting poor outpatient control but better than 9.4 in June 2015. - CBG's fluctuating. Continue Lantus and SSI. Will add mealtime NovoLog.    Macrocytic anemia - Hemoglobin fluctuating in the 8.7-11.3 range. Current baseline  probably in the mid 8 range. Follow CBCs and transfuse if hemoglobin less than 7 g per DL. Drop in hemoglobin from 11-8 may be secondary to ABLA from fractures. - Suspicious for iron deficiency (ferritin 08/18/13:41). Check anemia panel.    HYPERTENSION -Currently stable. Continue amlodipine, carvedilol, clonidine and hydralazine.     GERD (gastroesophageal reflux disease) -Continue PPI      Chronic diastolic heart failure; stable, compensated  - Volume management by dialysis     ESRD (end stage renal disease) - On hemodialysis, MWF, nephrology consulted     Hypothyroidism - Continue Synthroid, TSH 6/16 was 0.91  Thrombocytopenia  - unclear etiology. Follow CBCs     DVT Prophylaxis: heparin sq Code Status: Full  Family Communication: none at bedside  Disposition:  SNF when bed available.   Consultants:   Orthopedics    Nephrology  Procedures:   hemodialysis   Antibiotics:   none    Subjective:  patient seen at dialysis. States that her left hip pain is controlled except when she tries to move. Denies any other complaints. Had a BM on 8/18.  Objective: Weight change: -1.9 kg (-4 lb 3 oz)  Intake/Output Summary (Last 24 hours) at 07/25/14 1124 Last data filed at 07/25/14 1100  [name] per 24 hour  Intake    360 ml  Output   1547 ml  Net  -1187 ml   Blood pressure 147/55, pulse 60, temperature 97.8 F (36.6 C), temperature source Oral, resp. rate 18, height 5\' 4"  (1.626 m), weight 44.1 kg (97 lb 3.6 oz), SpO2 97.00%.  Physical Exam: General: Alert and awake, oriented x3, NAD CVS: S1-S2 clear, no murmur rubs or gallops  Chest: clear to auscultation bilaterally, no wheezing, rales or rhonchi Abdomen: soft nontender, nondistended, normal bowel sounds  Extremities: no cyanosis, clubbing or edema noted bilaterally. Left lower extremity is slightly shortened and externally rotated. Patient able to wiggle toes and move left foot without pain. Good peripheral pulses  bilaterally. CNS: Alert and oriented. No focal deficits.    Lab Results: Basic Metabolic Panel:  Recent Labs Lab 07/24/14 0957 07/25/14 0500  NA 137 137  K 3.6* 3.5*  CL 94* 95*  CO2 28 27  GLUCOSE 101* 124*  BUN 23 34*  CREATININE 3.35* 5.03*  CALCIUM 9.0 8.4   Liver Function Tests: No results found for this basename: AST, ALT, ALKPHOS, BILITOT, PROT, ALBUMIN,  in the last 168 hours No results found for this basename: LIPASE, AMYLASE,  in the last 168 hours No results found for this basename: AMMONIA,  in the last 168 hours CBC:  Recent Labs Lab 07/22/14 1045  07/24/14 0957 07/25/14 0500  WBC 9.4  < > 9.2 7.8  NEUTROABS 7.4  --   --   --   HGB 11.3*  < > 10.3* 8.8*  HCT 32.4*  < > 30.2* 25.7*  MCV 103.5*  < > 103.8* 104.5*  PLT 162  < > 156 132*  < > = values in this interval not displayed. Cardiac Enzymes:  Recent Labs Lab 07/22/14 1502  TROPONINI <0.30   BNP: No components found with this basename: POCBNP,  CBG:  Recent Labs Lab 07/24/14 0733 07/24/14 1212 07/24/14 1719 07/24/14 2026 07/25/14 0651  GLUCAP 109* 167* 320* 226* 120*     Micro Results: No results found for this or any previous visit (from the past 240 hour(s)).  Studies/Results: Dg Chest 2 View  07/22/2014   CLINICAL DATA:  Hip fracture.  Fall.  EXAM: CHEST  2 VIEW  COMPARISON:  12/21/2013  FINDINGS: Dialysis catheter removed. Mild cardiomegaly. Vascular congestion. Lungs hyper aerated and clear. No pneumothorax. No rib fracture.  IMPRESSION: Cardiomegaly without edema.  Hyperaeration.   Electronically Signed   By: Maryclare Bean M.D.   On: 07/22/2014 12:25   Dg Hip Complete Left  07/22/2014   CLINICAL DATA:  LEFT hip pain, fell this morning  EXAM: LEFT HIP - COMPLETE 2+ VIEW  COMPARISON:  04/16/2008, 04/15/2008, CT LEFT hip 04/15/2008  FINDINGS: Marked osseous demineralization.  Three cannulated screws present at proximal LEFT femur post pinning of a LEFT femoral neck fracture.  Newly  identified LEFT pelvic fractures including a displaced fracture through the superomedial acetabulum and a minimally displaced fracture through the LEFT pubic body.  Proximal LEFT femur appears intact.  RIGHT pelvis appears intact.  Scattered atherosclerotic calcifications pelvic phleboliths.  Extensive degenerative disc and facet disease changes lower lumbar spine.  IMPRESSION: Prior pinning of the LEFT proximal femur.  Marked osseous demineralization.  New displaced LEFT pelvic fractures including LEFT acetabulum and LEFT pubic body.   Electronically Signed   By: Ulyses Southward M.D.   On: 07/22/2014 12:24   Dg Femur Left  07/22/2014   CLINICAL DATA:  Fall  EXAM: LEFT FEMUR - 2 VIEW  COMPARISON:  Hip films same day  FINDINGS: No fracture or dislocation of the distal left femur. Joint space narrowing of the knee joint.  IMPRESSION: No distal left femur fracture   Electronically Signed   By: Genevive Bi M.D.   On: 07/22/2014 12:24   Ct Head Wo Contrast  07/22/2014   CLINICAL DATA:  Fall.  Left  hip pain.  EXAM: CT HEAD WITHOUT CONTRAST  TECHNIQUE: Contiguous axial images were obtained from the base of the skull through the vertex without intravenous contrast.  COMPARISON:  12/21/2013  FINDINGS: Chronic ischemic changes and atrophy are stable. No mass effect, midline shift, or acute intracranial hemorrhage. Right globe prosthesis stable. No skull fracture. Stable appearance of the odontoid.  IMPRESSION: No acute intracranial pathology.  Chronic changes.   Electronically Signed   By: Maryclare Bean M.D.   On: 07/22/2014 11:28    Medications: Scheduled Meds: . amLODipine  10 mg Oral Daily  . atorvastatin  20 mg Oral q1800  . carvedilol  12.5 mg Oral BID WC  . cloNIDine  0.1 mg Oral Q2200  . darbepoetin (ARANESP) injection - DIALYSIS  25 mcg Intravenous Q Wed-HD  . docusate sodium  100 mg Oral BID  . feeding supplement (RESOURCE BREEZE)  1 Container Oral BID BM  . heparin subcutaneous  5,000 Units  Subcutaneous 3 times per day  . hydrALAZINE  100 mg Oral BID  . insulin aspart  0-9 Units Subcutaneous TID WC  . insulin glargine  10 Units Subcutaneous QHS  . isosorbide mononitrate  20 mg Oral BID  . levothyroxine  88 mcg Oral QAC breakfast  . multivitamin  1 tablet Oral QHS  . pantoprazole  40 mg Oral Daily   Time spent: 30 minutes   LOS: 3 days   Tekia Waterbury, MD, FACP, FHM. [facility] Pager [phone]  If 7PM-7AM, please contact night-coverage [email or web address] Password [email or web address] 07/25/2014, 11:39 AM   **Disclaimer: This note was dictated with voice recognition software. Similar sounding words can inadvertently be transcribed and this note may contain transcription errors which may not have been corrected upon publication of note.**

## 2014-07-25 NOTE — Procedures (Signed)
I was present at this dialysis session. I have reviewed the session itself and made appropriate changes.   HD in bed today.  Will need full assist to get into HD chair as outpt.  [facility]  Qb 300, UF goal 2L.  Pt w/o c/o this AM  Sabra Heckyan Maree Ainley  MD 07/25/2014, 8:15 AM

## 2014-07-25 NOTE — Clinical Social Work Psychosocial (Signed)
Clinical Social Work Department BRIEF PSYCHOSOCIAL ASSESSMENT 07/25/2014  Patient:  Sharlet SalinaDWARDS,Kesley B     Account Number:  [id number]     Admit date:  07/22/2014  Clinical Social Worker:  Delmer IslamRAWFORD,Ahmarion Saraceno, LCSW  Date/Time:  07/25/2014 06:08 AM  Referred by:  Physician  Date Referred:  07/22/2014 Referred for  SNF Placement   Other Referral:   Interview type:  Patient Other interview type:    PSYCHOSOCIAL DATA Living Status:  ALONE Admitted from facility:   Level of care:   Primary support name:  Tomasa HoseKen Ghrist Primary support relationship to patient:  CHILD, ADULT Degree of support available:   Patient has 2 sons, Rocky LinkKen and Bernerd Phoandy Cropley    CURRENT CONCERNS Current Concerns  Post-Acute Placement   Other Concerns:    SOCIAL WORK ASSESSMENT / PLAN CSW talked with patient about discharge planning and need for 8 weeks of care from bed to chair at a skilled facility. Patient agreeable to rehab and her preference is [facility] as she has been there before and she has dialysis at [facility].  Contact made with Lowella BandyNikki, admissions Interior and spatial designerdirector at [facility] and [facility] can [facility] patient.    Admissions director did consult with therapy staff regarding working with patient and requested that patient be advised that Medicare may not pay for 8 weeks at a SNF due to the repetitive therapies that she will be involved with. CSW consulted with nurse case manager, and [facility] and [facility] made a bed offer. CSW and nurse case manager visited with patient and explained the services that [facility] can offer and patient agreeable to [facility]. [facility] director and attending MD advised.   Assessment/plan status:  No Further Intervention Required Other assessment/ plan:   Information/referral to community resources:       PATIENT'S/FAMILY'S RESPONSE TO PLAN OF CARE: [facility], but now agreeable to  [facility] based on the rehab and medical services they can provide for 8 weeks (or longer) for patient.

## 2014-07-26 DIAGNOSIS — IMO0001 Reserved for inherently not codable concepts without codable children: Secondary | ICD-10-CM

## 2014-07-26 LAB — CBC
HEMATOCRIT: 27.1 % — AB (ref 36.0–46.0)
Hemoglobin: 9.3 g/dL — ABNORMAL LOW (ref 12.0–15.0)
MCH: 35.5 pg — ABNORMAL HIGH (ref 26.0–34.0)
MCHC: 34.3 g/dL (ref 30.0–36.0)
MCV: 103.4 fL — ABNORMAL HIGH (ref 78.0–100.0)
Platelets: 151 10*3/uL (ref 150–400)
RBC: 2.62 MIL/uL — ABNORMAL LOW (ref 3.87–5.11)
RDW: 13.5 % (ref 11.5–15.5)
WBC: 7.9 10*3/uL (ref 4.0–10.5)

## 2014-07-26 LAB — GLUCOSE, CAPILLARY
GLUCOSE-CAPILLARY: 241 mg/dL — AB (ref 70–99)
GLUCOSE-CAPILLARY: 142 mg/dL — AB (ref 70–99)
Glucose-Capillary: 89 mg/dL (ref 70–99)
Glucose-Capillary: 148 mg/dL — ABNORMAL HIGH (ref 70–99)

## 2014-07-26 LAB — HEPATITIS B SURFACE ANTIGEN: HEP B S AG: NEGATIVE

## 2014-07-26 MED ORDER — SENNOSIDES-DOCUSATE SODIUM 8.6-50 MG PO TABS: 1.0000 | ORAL_TABLET | Freq: Every evening | ORAL | Status: DC | PRN

## 2014-07-26 MED ORDER — BOOST / RESOURCE BREEZE PO LIQD: 1.0000 | Freq: Two times a day (BID) | ORAL | Status: DC

## 2014-07-26 MED ORDER — INSULIN ASPART 100 UNIT/ML ~~LOC~~ SOLN: 3.0000 [IU] | Freq: Three times a day (TID) | SUBCUTANEOUS | Status: DC

## 2014-07-26 MED ORDER — DSS 100 MG PO CAPS: 100.0000 mg | ORAL_CAPSULE | Freq: Two times a day (BID) | ORAL | Status: AC

## 2014-07-26 MED ORDER — INSULIN ASPART 100 UNIT/ML ~~LOC~~ SOLN: 0.0000 [IU] | Freq: Three times a day (TID) | SUBCUTANEOUS | Status: AC

## 2014-07-26 MED ORDER — MORPHINE SULFATE 2 MG/ML IJ SOLN: 0.5000 mg | INTRAMUSCULAR | Status: DC | PRN

## 2014-07-26 MED ORDER — HYDROCODONE-ACETAMINOPHEN 5-325 MG PO TABS: 1.0000 | ORAL_TABLET | Freq: Four times a day (QID) | ORAL | Status: DC | PRN

## 2014-07-26 NOTE — Clinical Social Work Note (Signed)
CSW followed-up with patient [facility]. CSW and nurse case manager talked with patient on 8/19 regarding SNF versus LTAC and patient chose [facility]. She is experiencing uncertainty about whether she made the right decision and is worried about her stay there. CSW listened emphatically and provided encouragement and support.  Genelle BalVanessa Kimbery Harwood, MSW, LCSW [phone]

## 2014-07-26 NOTE — Progress Notes (Signed)
Inpatient Diabetes Program Recommendations  AACE/ADA: New Consensus Statement on Inpatient Glycemic Control (2013)  Target Ranges:  Prepandial:   less than 140 mg/dL      Peak postprandial:   less than 180 mg/dL (1-2 hours)      Critically ill patients:  140 - 180 mg/dL     Results for Nicole [name], Nicole [name] (MRN [id number]) as of 07/26/2014 08:41  Ref. Range 07/25/2014 09:20 07/25/2014 11:31 07/25/2014 16:56 07/25/2014 20:56  Glucose-Capillary Latest Range: 70-99 mg/dL 89 696137 (H) 295288 (H) 284237 (H)    Results for Nicole [name], Nicole [name] (MRN [id number]) as of 07/26/2014 08:41  Ref. Range 07/26/2014 07:44  Glucose-Capillary Latest Range: 70-99 mg/dL 725241 (H)     Current Insulin Orders:  Lantus 10 units QHS Novolog Sensitive SSI Novolog 3 units tidwc (Meal Coverage) [Meal Coverage added yesterday at Noon]    MD- Fasting glucose elevated today.  If this trend continues, please consider increasing Lantus to home dose of 15 units QHS     Will follow Ambrose FinlandJeannine Johnston Ronnika Collett RN, MSN, CDE Diabetes Coordinator Inpatient Diabetes Program Team Pager: [phone] (8a-10p)

## 2014-07-26 NOTE — Discharge Summary (Signed)
Physician Discharge Summary  TRANIKA SCHOLLER ZOX:[id number] DOB: 23-Jul-1935 DOA: 07/22/2014  PCP: Loreen Freud, DO  Admit date: 07/22/2014 Discharge date: 07/26/2014  Time spent: Greater than 30 minutes  Recommendations for Outpatient Follow-up:  1. Continue her regular hemodialysis on Mondays/Wednesdays/Fridays at [facility]. 2. Dr. Myrene Galas, Orthopedics-regarding followup and management of left pelvic fractures and acetabular fracture. 3. Dr. Loreen Freud, PCP upon discharge from nursing facility.   Discharge Diagnoses:  Principal Problem:   Acetabulum fracture, left Active Problems:   DM (diabetes mellitus), type 2 with renal complications   ANEMIA OF CHRONIC DISEASE   HYPERTENSION   GERD (gastroesophageal reflux disease)   Chronic diastolic heart failure   ESRD (end stage renal disease)   Hypothyroidism   Hip fracture   Pelvic fracture   Acetabular fracture   Protein-calorie malnutrition, severe   Discharge Condition: Improved & Stable  Diet recommendation: Heart healthy and diabetic diet.  Filed Weights   07/24/14 2027 07/25/14 1100 07/25/14 2050  Weight: 50.1 kg (110 lb 7.2 oz) 44.1 kg (97 lb 3.6 oz) 44.101 kg (97 lb 3.6 oz)    History of present illness:  79 year old female patient with history of chronic diastolic CHF, ESRD on MWF HD, HTN, CAD, hypercholesterolemia, DM, hypothyroid, osteoporosis and vitamin D deficiency, livers alone and ambulates with the help of a cane, tripped at home while trying to put on her shoes and fell on 07/22/14. Evaluation in the ED revealed complex left acetabular fractures and pelvic fractures. Orthopedics recommended an operative management secondary to high risk candidate for surgery  Hospital Course:   Principal Problem:  Complex left acetabulum fracture,  - both column/anterior column-posterior hemi-transverse with medialization of the quadrilateral plate, sec to mechanical fall:  - Seen by orthopedics, recommended  nonoperative management for 8-12 weeks, definitely be bed to chair for the next 8 weeks for sure and then may begin some graduated weightbearing on her left leg after that time. Bed to chair transfers should either be slide or lift transfers only during the beginning phases. Patient may then progress to stand pivot transfers on her right leg.  - Pain currently controlled  - Patient will need full assist to get into HD chair as outpatient.  - Patient being discharged to [facility] for management of her fracture related activity needs and ongoing hemodialysis. - Outpatient followup with orthopedics regarding further management.  Active Problems:  DM (diabetes mellitus), type 2 with renal complications: uncontrolled  - A1c: 8.6 suggesting poor outpatient control but better than 9.4 in June 2015.  -Continue Lantus and NovoLog SSI. Mealtime NovoLog added. These medications will obviously need to be titrated further. CBGs uncontrolled in the 200 mg per DL range.  Macrocytic anemia  - Hemoglobin fluctuating in the 8.7-11.3 range. Current baseline probably in the mid 8 range. Follow CBCs and transfuse if hemoglobin less than 7 g per DL. Drop in hemoglobin from 11-8 may be secondary to ABLA from fractures.  - Suspicious for iron deficiency (ferritin 08/18/13:41). Check anemia panel at [facility]. Hemoglobin better at 9.3. Periodically follow CBCs across dialysis.  HYPERTENSION  -Currently stable. Continue amlodipine, carvedilol, clonidine and hydralazine.   GERD (gastroesophageal reflux disease)  -Continue PPI   Chronic diastolic heart failure; stable, compensated  - Volume management by dialysis   ESRD (end stage renal disease)  - On hemodialysis, MWF, nephrology consulted   Hypothyroidism  - Continue Synthroid, TSH 6/16 was 0.91   Thrombocytopenia  - unclear etiology. Resolved.   Consultants:  Orthopedics  Nephrology  Procedures:  Hemodialysis     Discharge Exam:  Complaints:   Patient states that her left hip pain can get to 10/10 with minimal movement but controlled if she is lying still. Denies any other complaints.  Filed Vitals:   07/25/14 1700 07/25/14 2050 07/26/14 0541 07/26/14 1000  BP: 96/59 130/48 139/52 143/41  Pulse: 64 62 65 95  Temp: 99.1 F (37.3 C) 99.5 F (37.5 C) 98.8 F (37.1 C) 98 F (36.7 C)  TempSrc: Oral Oral Oral Oral  Resp: 20 20 18 18   Height:      Weight:  44.101 kg (97 lb 3.6 oz)    SpO2: 98% 98% 98% 98%    General: Alert and awake, oriented x3, NAD  CVS: S1-S2 clear, no murmur rubs or gallops  Chest: clear to auscultation bilaterally, no wheezing, rales or rhonchi  Abdomen: soft nontender, nondistended, normal bowel sounds  Extremities: no cyanosis, clubbing or edema noted bilaterally. Left lower extremity is slightly shortened and externally rotated. Patient able to wiggle toes and move left foot without pain. Good peripheral pulses bilaterally.  CNS: Alert and oriented. No focal deficits.   Discharge Instructions      Discharge Instructions   Call MD for:  severe uncontrolled pain    Complete by:  As directed      Diet - low sodium heart healthy    Complete by:  As directed      Diet Carb Modified    Complete by:  As directed      Discharge instructions    Complete by:  As directed   Please see discharge summary for activity status.            Medication List    STOP taking these medications       acetaminophen 500 MG tablet  Commonly known as:  TYLENOL     insulin lispro 100 UNIT/ML injection  Commonly known as:  HUMALOG      TAKE these medications       amLODipine 10 MG tablet  Commonly known as:  NORVASC  Take 1 tablet (10 mg total) by mouth daily.     aspirin EC 81 MG tablet  Take 81 mg by mouth daily.     atorvastatin 20 MG tablet  Commonly known as:  LIPITOR  Take 1 tablet (20 mg total) by mouth daily at 6 PM.     carvedilol 12.5 MG tablet  Commonly known as:  COREG  Take 1 tablet  (12.5 mg total) by mouth 2 (two) times daily with a meal.     cloNIDine 0.1 MG tablet  Commonly known as:  CATAPRES  Take 0.1 mg by mouth daily at 10 pm.     DSS 100 MG Caps  Take 100 mg by mouth 2 (two) times daily.     feeding supplement (RESOURCE BREEZE) Liqd  Take 1 Container by mouth 2 (two) times daily between meals.     hydrALAZINE 100 MG tablet  Commonly known as:  APRESOLINE  Take 100 mg by mouth 2 (two) times daily.     HYDROcodone-acetaminophen 5-325 MG per tablet  Commonly known as:  NORCO/VICODIN  Take 1-2 tablets by mouth every 6 (six) hours as needed for moderate pain.     insulin aspart 100 UNIT/ML injection  Commonly known as:  novoLOG  - Inject 0-9 Units into the skin 3 (three) times daily with meals. CBG < 70: implement hypoglycemia protocol  - CBG  70 - 120: 0 units  - CBG 121 - 150: 1 unit  - CBG 151 - 200: 2 units  - CBG 201 - 250: 3 units  - CBG 251 - 300: 5 units  - CBG 301 - 350: 7 units  - CBG 351 - 400: 9 units  - CBG > 400: call MD.     insulin aspart 100 UNIT/ML injection  Commonly known as:  novoLOG  Inject 3 Units into the skin 3 (three) times daily with meals.     insulin glargine 100 UNIT/ML injection  Commonly known as:  LANTUS  Inject 0.15 mLs (15 Units total) into the skin at bedtime.     isosorbide mononitrate 20 MG tablet  Commonly known as:  ISMO,MONOKET  Take 1 tablet (20 mg total) by mouth 2 (two) times daily.     levothyroxine 88 MCG tablet  Commonly known as:  SYNTHROID, LEVOTHROID  Take 88 mcg by mouth daily before breakfast.     morphine 2 MG/ML injection  Inject 0.25 mLs (0.5 mg total) into the vein every 4 (four) hours as needed (Severe pain.).     nitroGLYCERIN 0.4 MG SL tablet  Commonly known as:  NITROSTAT  Place 1 tablet (0.4 mg total) under the tongue every 5 (five) minutes as needed for chest pain.     pantoprazole 40 MG tablet  Commonly known as:  PROTONIX  Take 1 tablet (40 mg total) by mouth daily.      RENA-VITE PO  Take 1 tablet by mouth daily.     senna-docusate 8.6-50 MG per tablet  Commonly known as:  Senokot-S  Take 1 tablet by mouth at bedtime as needed for mild constipation.     Vitamin D 1000 UNITS capsule  Take 1,000 Units by mouth daily.       Follow-up Information   Follow up with Hemodialysis. (Continue regular hemodialysis on Mondays/Wednesdays/Fridays at [facility].)       Schedule an appointment as soon as possible for a visit with Budd Palmer, MD.   Specialty:  Orthopedic Surgery   Contact information:   [address] [place] [address] [phone]       Schedule an appointment as soon as possible for a visit with Loreen Freud, DO. (Upon discharge from nursing facility.)    Specialty:  Family Medicine   Contact information:   [phone] [place] [address] [phone]        The results of significant diagnostics from this hospitalization (including imaging, microbiology, ancillary and laboratory) are listed below for reference.    Significant Diagnostic Studies: Dg Chest 2 View  07/22/2014   CLINICAL DATA:  Hip fracture.  Fall.  EXAM: CHEST  2 VIEW  COMPARISON:  12/21/2013  FINDINGS: Dialysis catheter removed. Mild cardiomegaly. Vascular congestion. Lungs hyper aerated and clear. No pneumothorax. No rib fracture.  IMPRESSION: Cardiomegaly without edema.  Hyperaeration.   Electronically Signed   By: Maryclare Bean M.D.   On: 07/22/2014 12:25   Dg Wrist Complete Left  07/23/2014   CLINICAL DATA:  Pain after falling yesterday. Pain is in the region of the ulnar head.  EXAM: LEFT WRIST - COMPLETE 3+ VIEW  COMPARISON:  None.  FINDINGS: Bones appear osteopenic. There is no acute fracture. There is degenerative change at the first carpometacarpal joint. There is atherosclerotic calcification of the small arteries of the wrist.  IMPRESSION: No evidence for acute  abnormality.   Electronically Signed   By:  Rosalie GumsBeth  Brown M.D.   On:  07/23/2014 17:54   Dg Hip Complete Left  07/22/2014   CLINICAL DATA:  LEFT hip pain, fell this morning  EXAM: LEFT HIP - COMPLETE 2+ VIEW  COMPARISON:  04/16/2008, 04/15/2008, CT LEFT hip 04/15/2008  FINDINGS: Marked osseous demineralization.  Three cannulated screws present at proximal LEFT femur post pinning of a LEFT femoral neck fracture.  Newly identified LEFT pelvic fractures including a displaced fracture through the superomedial acetabulum and a minimally displaced fracture through the LEFT pubic body.  Proximal LEFT femur appears intact.  RIGHT pelvis appears intact.  Scattered atherosclerotic calcifications pelvic phleboliths.  Extensive degenerative disc and facet disease changes lower lumbar spine.  IMPRESSION: Prior pinning of the LEFT proximal femur.  Marked osseous demineralization.  New displaced LEFT pelvic fractures including LEFT acetabulum and LEFT pubic body.   Electronically Signed   By: Ulyses SouthwardMark  Boles M.D.   On: 07/22/2014 12:24   Dg Femur Left  07/22/2014   CLINICAL DATA:  Fall  EXAM: LEFT FEMUR - 2 VIEW  COMPARISON:  Hip films same day  FINDINGS: No fracture or dislocation of the distal left femur. Joint space narrowing of the knee joint.  IMPRESSION: No distal left femur fracture   Electronically Signed   By: Genevive BiStewart  Edmunds M.D.   On: 07/22/2014 12:24   Ct Head Wo Contrast  07/22/2014   CLINICAL DATA:  Fall.  Left hip pain.  EXAM: CT HEAD WITHOUT CONTRAST  TECHNIQUE: Contiguous axial images were obtained from the base of the skull through the vertex without intravenous contrast.  COMPARISON:  12/21/2013  FINDINGS: Chronic ischemic changes and atrophy are stable. No mass effect, midline shift, or acute intracranial hemorrhage. Right globe prosthesis stable. No skull fracture. Stable appearance of the odontoid.  IMPRESSION: No acute intracranial pathology.  Chronic changes.   Electronically Signed   By: Maryclare BeanArt  Hoss M.D.   On: 07/22/2014 11:28   Ct Pelvis Wo Contrast  07/23/2014    CLINICAL DATA:  Left acetabular fracture.  Preoperative planning.  EXAM: CT PELVIS WITHOUT CONTRAST, CT 3D RECON AT SCANNER  TECHNIQUE: Multidetector CT imaging of the pelvis was performed following the standard protocol without intravenous contrast.  3-dimensional CT images were rendered by post-processing of the original CT data at the CT scanner. The 3-dimensional CT images were interpreted, and findings were reported in the accompanying complete CT report for this study.  COMPARISON:  Left hip and pelvic radiographs 01/23/2014 and 07/22/2014.  FINDINGS: The bones are diffusely demineralized. The patient has undergone previous pinning of a subcapital fracture of the left femoral neck. This fracture appears healed with mild residual posttraumatic deformity. There is no evidence of acute proximal femur fracture or dislocation.  As demonstrated radiographically, there is an extensively comminuted and displaced fracture of the left acetabulum. This fracture involves the acetabular roof and anterior column, resulting in up to 3 cm of medial and 2.4 cm of anterior displacement. The posterior wall of the acetabulum is intact. There is no dislocation.  There are mildly displaced acute fractures of the left superior and inferior pubic rami. Underlying posttraumatic deformities of the left pubic rami are noted.  The sacrum, sacroiliac joints and symphysis pubis are intact. No right-sided pelvic fractures are demonstrated. Diffuse degenerative changes are present throughout the lower lumbar spine.  There is a moderate size left pelvic hematoma involving the iliacus muscle. There is mild subcutaneous edema throughout the pelvis. Multiple vascular calcifications in uterine fibroids are noted.  IMPRESSION: Moderately displaced and comminuted fracture of the left acetabulum as described. Associated mildly displaced fractures of the left superior and inferior pubic rami. The sacroiliac joints are intact.   Electronically Signed    By: Roxy Horseman M.D.   On: 07/23/2014 13:50   Dg Pelvis Comp Min 3v  07/23/2014   CLINICAL DATA:  LEFT acetabular fracture.  EXAM: JUDET PELVIS - 3+ VIEW  COMPARISON:  07/22/2014.  FINDINGS: There is a displaced LEFT acetabular fracture. Displacement of the LEFT iliac pubic line measures 2.8 cm. Complementary LEFT obturator ring fractures are present. Acetabular incongruity is about 8 mm on the to day views. Patient is osteopenic. Given the osteopenia, a noncontrast CT would probably be useful for preoperative planning in this patient with displaced LEFT pelvic ring fractures.  IMPRESSION: LEFT acetabular disruption with displaced LEFT pelvic ring fractures. Given the osteopenia, a noncontrast CT should be considered to further assess.   Electronically Signed   By: Andreas Newport M.D.   On: 07/23/2014 13:00   Ct 3d Recon At Scanner  07/23/2014   CLINICAL DATA:  Left acetabular fracture.  Preoperative planning.  EXAM: CT PELVIS WITHOUT CONTRAST, CT 3D RECON AT SCANNER  TECHNIQUE: Multidetector CT imaging of the pelvis was performed following the standard protocol without intravenous contrast.  3-dimensional CT images were rendered by post-processing of the original CT data at the CT scanner. The 3-dimensional CT images were interpreted, and findings were reported in the accompanying complete CT report for this study.  COMPARISON:  Left hip and pelvic radiographs 01/23/2014 and 07/22/2014.  FINDINGS: The bones are diffusely demineralized. The patient has undergone previous pinning of a subcapital fracture of the left femoral neck. This fracture appears healed with mild residual posttraumatic deformity. There is no evidence of acute proximal femur fracture or dislocation.  As demonstrated radiographically, there is an extensively comminuted and displaced fracture of the left acetabulum. This fracture involves the acetabular roof and anterior column, resulting in up to 3 cm of medial and 2.4 cm of anterior  displacement. The posterior wall of the acetabulum is intact. There is no dislocation.  There are mildly displaced acute fractures of the left superior and inferior pubic rami. Underlying posttraumatic deformities of the left pubic rami are noted.  The sacrum, sacroiliac joints and symphysis pubis are intact. No right-sided pelvic fractures are demonstrated. Diffuse degenerative changes are present throughout the lower lumbar spine.  There is a moderate size left pelvic hematoma involving the iliacus muscle. There is mild subcutaneous edema throughout the pelvis. Multiple vascular calcifications in uterine fibroids are noted.  IMPRESSION: Moderately displaced and comminuted fracture of the left acetabulum as described. Associated mildly displaced fractures of the left superior and inferior pubic rami. The sacroiliac joints are intact.   Electronically Signed   By: Roxy Horseman M.D.   On: 07/23/2014 13:50    Microbiology: No results found for this or any previous visit (from the past 240 hour(s)).   Labs: Basic Metabolic Panel:  Recent Labs Lab 07/22/14 1045 07/23/14 0720 07/24/14 0957 07/25/14 0500  NA 139 137 137 137  K 3.2* 3.7 3.6* 3.5*  CL 95* 96 94* 95*  CO2 27 27 28 27   GLUCOSE 118* 218* 101* 124*  BUN 22 31* 23 34*  CREATININE 3.18* 4.03* 3.35* 5.03*  CALCIUM 9.2 8.5 9.0 8.4   Liver Function Tests: No results found for this basename: AST, ALT, ALKPHOS, BILITOT, PROT, ALBUMIN,  in the last 168 hours No results found  for this basename: LIPASE, AMYLASE,  in the last 168 hours No results found for this basename: AMMONIA,  in the last 168 hours CBC:  Recent Labs Lab 07/22/14 1045 07/23/14 0720 07/24/14 0957 07/25/14 0500 07/26/14 0606  WBC 9.4 6.9 9.2 7.8 7.9  NEUTROABS 7.4  --   --   --   --   HGB 11.3* 8.7* 10.3* 8.8* 9.3*  HCT 32.4* 25.2* 30.2* 25.7* 27.1*  MCV 103.5* 102.9* 103.8* 104.5* 103.4*  PLT 162 129* 156 132* 151   Cardiac Enzymes:  Recent Labs Lab  07/22/14 1502  TROPONINI <0.30   BNP: BNP (last 3 results)  Recent Labs  10/29/13 1719  PROBNP 5412.0*   CBG:  Recent Labs Lab 07/25/14 1131 07/25/14 1656 07/25/14 2056 07/26/14 0744 07/26/14 1124  GLUCAP 137* 288* 237* 241* 142*   Left message for son Mr. Rocky Link & Gwendlyon Zumbro.   Signed:  Marcellus Scott, MD, FACP, FHM. [facility] Pager [phone]  If 7PM-7AM, please contact night-coverage [email or web address] Password [email or web address] 07/26/2014, 1:20 PM

## 2014-07-26 NOTE — Progress Notes (Signed)
I saw the patient and agree with the above assessment and plan.   Note possible LTACH.  Keep on HD schedule for now.

## 2014-07-26 NOTE — Progress Notes (Signed)
Physical Therapy Treatment Patient Details Name: Nicole [name] MRN: [id number] DOB: [birth year] Today's Date: 07/26/2014    History of Present Illness Admitted post fall resulting in complex L acetabular fx; non-operative management; strict NWB LLE; Per Ortho-Trauma: Should the patient bear weight on her left leg this may lead to catastrophic disruption of her fracture fragments.    PT Comments    Pt. Presents to PT already up in recliner today via nursing staff.  I discussed with nursing tech that pt. Is only to complete sliding/scooting transferor with lift equipment initially (and not stand or squat pivot) due to integrity of complex hip fracture.  I spent about 20 minutes educating pt. On current need for slide/scoot lateral transfers or with lift equipment.  I also educated pt. On lateral shifting technique for pressure relief and she was able to demonstrate adequately.  Pt. Educated on scooting self back into recliner and was able to use her UEs to assist self with moving to back of recliner.  We discussed the importance of pt. Taking ultimate responsibility for this as she does have good cognitive function.  I am sending a copy of this progress note with [facility] in [facility] transfer packet.  Discussed with Cybill, RN, pt's nurse for today.    Follow Up Recommendations  [facility] not be able to keep pt. For 8 weeks as her condition necessitates     Equipment Recommendations  Wheelchair (measurements PT);Wheelchair cushion (measurements PT)    Recommendations for Other Services       Precautions / Restrictions Precautions Precautions: Fall Precaution Comments: Per Ortho-[place]: Bed to chair transfers should either be slide or lift transfers only during the beginning phases. Patient may then progress to stand pivot transfers on her right leg. Restrictions Weight Bearing Restrictions: Yes LLE  Weight Bearing: Non weight bearing    Mobility  Bed Mobility Overal bed mobility:  (not assessed, pt. in recliner)                Transfers                    Ambulation/Gait                 Stairs            Wheelchair Mobility    Modified Rankin (Stroke Patients Only)       Balance                                    Cognition Arousal/Alertness: Awake/alert Behavior During Therapy: WFL for tasks assessed/performed Overall Cognitive Status: Within Functional Limits for tasks assessed                      Exercises General Exercises - Lower Extremity Ankle Circles/Pumps: AROM;Both;10 reps;Seated    General Comments        Pertinent Vitals/Pain Pain Assessment: 0-10 Pain Score: 0-No pain (no pain while at rest in chair)    Home Living                      Prior Function            PT Goals (current goals can now be found in the care plan section) Progress towards PT goals: Progressing toward goals    Frequency  Min 3X/week    PT Plan Current plan remains appropriate    Co-evaluation             End of Session   Activity Tolerance: Other (comment) (good for exercise and education) Patient left: in chair;with call bell/phone within reach;with chair alarm set     Time: 1610-9604 PT Time Calculation (min): 24 min  Charges:  $Self Care/Home Management: 23-37                    G Codes:      Ferman Hamming 07/26/2014, 12:19 PM Weldon Picking PT Acute Rehab Services [phone] [place] [phone]

## 2014-07-26 NOTE — Progress Notes (Signed)
Subjective:   Taking less pain meds. Tolerated hd yesterday/ possible [facility] dc  Objective Vital signs in last 24 hours: Filed Vitals:   07/25/14 1133 07/25/14 1700 07/25/14 2050 07/26/14 0541  BP: 150/38 96/59 130/48 139/52  Pulse: 58 64 62 65  Temp: 98.3 F (36.8 C) 99.1 F (37.3 C) 99.5 F (37.5 C) 98.8 F (37.1 C)  TempSrc: Oral Oral Oral Oral  Resp: 19 20 20 18   Height:      Weight:   44.101 kg (97 lb 3.6 oz)   SpO2: 97% 98% 98% 98%   Weight change: -6 kg (-13 lb 3.6 oz)  Physical Exam: General: alert pleasant Elderly WF nad Heart: RRR Lungs: CTA Abdomen: soft, nt, nd Extremities:  No pedal edema Dialysis Access:  Pos. Bruit LUA AVF   OP HD: MWF [facility]  Problem/Plan: 1. ESRD ; HD MWF on schedule with  AVF/ Tight heparin for next treatment. Appears that she will be dc to [facility] =so HD there and will avoid transportation issues to op hd 2. L transverse acetablular fracture: nonoperative mgmt per orthopedics 3. HTN/Vol: BP 139/52 stable and  Keep EDW= 46.5kg , but will be challengint to assess with bed wts 4. CKDBMD: not on VDRA or cinacalcet. Recent PTH, Phos acceptable as outpt.  5. Anemia of CKD: HGB 8.8 >9.3cont Aranesp 25 qWed. Iron replete based on outpt labs. Lenny Pastelavid Trayven Lumadue, PA-C [facility] Beeper [phone] 07/26/2014,8:33 AM  LOS: 4 days   Labs: Basic Metabolic Panel:  Recent Labs Lab 07/23/14 0720 07/24/14 0957 07/25/14 0500  NA 137 137 137  K 3.7 3.6* 3.5*  CL 96 94* 95*  CO2 27 28 27   GLUCOSE 218* 101* 124*  BUN 31* 23 34*  CREATININE 4.03* 3.35* 5.03*  CALCIUM 8.5 9.0 8.4  CBC:  Recent Labs Lab 07/22/14 1045 07/23/14 0720 07/24/14 0957 07/25/14 0500 07/26/14 0606  WBC 9.4 6.9 9.2 7.8 7.9  NEUTROABS 7.4  --   --   --   --   HGB 11.3* 8.7* 10.3* 8.8* 9.3*  HCT 32.4* 25.2* 30.2* 25.7* 27.1*  MCV 103.5* 102.9* 103.8* 104.5* 103.4*  PLT 162 129* 156 132* 151    Recent Labs Lab 07/22/14 1502  TROPONINI <0.30    CBG:  Recent Labs Lab 07/25/14 0920 07/25/14 1131 07/25/14 1656 07/25/14 2056 07/26/14 0744  GLUCAP 89 137* 288* 237* 241*   Medications: . sodium chloride 20 mL (07/22/14 1315)   . amLODipine  10 mg Oral Daily  . atorvastatin  20 mg Oral q1800  . carvedilol  12.5 mg Oral BID WC  . cloNIDine  0.1 mg Oral Q2200  . darbepoetin (ARANESP) injection - DIALYSIS  25 mcg Intravenous Q Wed-HD  . docusate sodium  100 mg Oral BID  . feeding supplement (RESOURCE BREEZE)  1 Container Oral BID BM  . heparin subcutaneous  5,000 Units Subcutaneous 3 times per day  . hydrALAZINE  100 mg Oral BID  . insulin aspart  0-9 Units Subcutaneous TID WC  . insulin aspart  3 Units Subcutaneous TID WC  . insulin glargine  10 Units Subcutaneous QHS  . isosorbide mononitrate  20 mg Oral BID  . levothyroxine  88 mcg Oral QAC breakfast  . multivitamin  1 tablet Oral QHS  . pantoprazole  40 mg Oral Daily     \

## 2014-07-26 NOTE — Discharge Instructions (Signed)
Orthopaedic Trauma Service Discharge Instructions,   General Discharge Instructions  WEIGHT BEARING STATUS: Nonweightbearing Left leg, bed to chair transfers only, lift or slide   RANGE OF MOTION/ACTIVITY: as tolerated within limits of pain   Diet: as you were eating previously.  Can use over the counter stool softeners and bowel preparations, such as Miralax, to help with bowel movements.  Narcotics can be constipating.  Be sure to drink plenty of fluids  STOP SMOKING OR USING NICOTINE PRODUCTS!!!!  As discussed nicotine severely impairs your body's ability to heal surgical and traumatic wounds but also impairs bone healing.  Wounds and bone heal by forming microscopic blood vessels (angiogenesis) and nicotine is a vasoconstrictor (essentially, shrinks blood vessels).  Therefore, if vasoconstriction occurs to these microscopic blood vessels they essentially disappear and are unable to deliver necessary nutrients to the healing tissue.  This is one modifiable factor that you can do to dramatically increase your chances of healing your injury.    (This means no smoking, no nicotine gum, patches, etc)  DO NOT USE NONSTEROIDAL ANTI-INFLAMMATORY DRUGS (NSAID'S)  Using products such as Advil (ibuprofen), Aleve (naproxen), Motrin (ibuprofen) for additional pain control during fracture healing can delay and/or prevent the healing response.  If you would like to take over the counter (OTC) medication, Tylenol (acetaminophen) is ok.  However, some narcotic medications that are given for pain control contain acetaminophen as well. Therefore, you should not exceed more than 4000 mg of tylenol in a day if you do not have liver disease.  Also note that there are may OTC medicines, such as cold medicines and allergy medicines that my contain tylenol as well.  If you have any questions about medications and/or interactions please ask your doctor/PA or your pharmacist.   PAIN MEDICATION USE AND EXPECTATIONS  You  have likely been given narcotic medications to help control your pain.  After a traumatic event that results in an fracture (broken bone) with or without surgery, it is ok to use narcotic pain medications to help control one's pain.  We understand that everyone responds to pain differently and each individual patient will be evaluated on a regular basis for the continued need for narcotic medications. Ideally, narcotic medication use should last no more than 6-8 weeks (coinciding with fracture healing).   As a patient it is your responsibility as well to monitor narcotic medication use and report the amount and frequency you use these medications when you come to your office visit.   We would also advise that if you are using narcotic medications, you should take a dose prior to therapy to maximize you participation.  IF YOU ARE ON NARCOTIC MEDICATIONS IT IS NOT PERMISSIBLE TO OPERATE A MOTOR VEHICLE (MOTORCYCLE/CAR/TRUCK/MOPED) OR HEAVY MACHINERY DO NOT MIX NARCOTICS WITH OTHER CNS (CENTRAL NERVOUS SYSTEM) DEPRESSANTS SUCH AS ALCOHOL       ICE AND ELEVATE INJURED/OPERATIVE EXTREMITY  Using ice and elevating the injured extremity above your heart can help with swelling and pain control.  Icing in a pulsatile fashion, such as 20 minutes on and 20 minutes off, can be followed.    Do not place ice directly on skin. Make sure there is a barrier between to skin and the ice pack.    Using frozen items such as frozen peas works well as the conform nicely to the are that needs to be iced.  USE AN ACE WRAP OR TED HOSE FOR SWELLING CONTROL  In addition to icing and elevation, Ace wraps  or TED hose are used to help limit and resolve swelling.  It is recommended to use Ace wraps or TED hose until you are informed to stop.    When using Ace Wraps start the wrapping distally (farthest away from the body) and wrap proximally (closer to the body)   Example: If you had surgery on your leg or thing and you do not  have a splint on, start the ace wrap at the toes and work your way up to the thigh        If you had surgery on your upper extremity and do not have a splint on, start the ace wrap at your fingers and work your way up to the upper arm  IF YOU ARE IN A SPLINT OR CAST DO NOT REMOVE IT FOR ANY REASON   If your splint gets wet for any reason please contact the office immediately. You may shower in your splint or cast as long as you keep it dry.  This can be done by wrapping in a cast cover or garbage back (or similar)  Do Not stick any thing down your splint or cast such as pencils, money, or hangers to try and scratch yourself with.  If you feel itchy take benadryl as prescribed on the bottle for itching  IF YOU ARE IN A CAM BOOT (BLACK BOOT)  You may remove boot periodically. Perform daily dressing changes as noted below.  Wash the liner of the boot regularly and wear a sock when wearing the boot. It is recommended that you sleep in the boot until told otherwise  CALL THE OFFICE WITH ANY QUESTIONS OR CONCERTS: [phone]

## 2014-07-26 NOTE — Care Management Note (Signed)
CARE MANAGEMENT NOTE 07/26/2014  Patient:  Nicole [name], Nicole [name]   Account Number:  [id number]  Date Initiated:  07/26/2014  Documentation initiated by:  Cleatis Fandrich  Subjective/Objective Assessment:   CM following for progression and d/c planning.     Action/Plan:   07/26/2014 Met with pt re d/c planning pt agreeable to d/c to LTAC for ongoing acute care during NWB period and rehab.   Anticipated DC Date:  07/26/2014   Anticipated DC Plan:  LONG TERM ACUTE CARE (LTAC)         Choice offered to / List presented to:             Status of service:  Completed, signed off Medicare Important Message given?  YES (If response is "NO", the following Medicare IM given date fields will be blank) Date Medicare IM given:  07/26/2014 Medicare IM given by:  Amity Roes Date Additional Medicare IM given:   Additional Medicare IM given by:    Discharge Disposition:  LONG TERM ACUTE CARE (LTAC)  Per UR Regulation:    If discussed at Long Length of Stay Meetings, dates discussed:    Comments:  07/26/2014 Pt has accepted an offer for placement at [facility] for ongoing acute care during NWB period of recovery. Plan to d/c to that facility today.  CRoyal RN MPH case manager, [phone]

## 2014-07-31 ENCOUNTER — Ambulatory Visit: Payer: Medicare Other | Admitting: Cardiology

## 2014-08-08 LAB — BASIC METABOLIC PANEL
BUN: 44 mg/dL — AB (ref 4–21)
CREATININE: 3.8 mg/dL — AB (ref 0.5–1.1)
Glucose: 231 mg/dL
Potassium: 4.1 mmol/L (ref 3.4–5.3)
SODIUM: 131 mmol/L — AB (ref 137–147)

## 2014-08-08 LAB — CBC AND DIFFERENTIAL
HCT: 34 % — AB (ref 36–46)
Hemoglobin: 11.1 g/dL — AB (ref 12.0–16.0)
PLATELETS: 230 10*3/uL (ref 150–399)
WBC: 9.3 10^3/mL

## 2014-08-08 LAB — HEPATIC FUNCTION PANEL
ALT: 30 U/L (ref 7–35)
AST: 28 U/L (ref 13–35)
Alkaline Phosphatase: 185 U/L — AB (ref 25–125)
Bilirubin, Total: 0.8 mg/dL

## 2014-08-14 NOTE — Consult Note (Signed)
I have seen and examined the patient. I agree with the findings above. I have discussed with Dr. Rennis Chris and the patient's son, who is in agreement with nonsurgical management.  Budd Palmer, MD

## 2014-08-17 ENCOUNTER — Non-Acute Institutional Stay (SKILLED_NURSING_FACILITY): Payer: Medicare Other | Admitting: Internal Medicine

## 2014-08-17 DIAGNOSIS — S72009D Fracture of unspecified part of neck of unspecified femur, subsequent encounter for closed fracture with routine healing: Secondary | ICD-10-CM

## 2014-08-17 DIAGNOSIS — E7800 Pure hypercholesterolemia, unspecified: Secondary | ICD-10-CM

## 2014-08-17 DIAGNOSIS — S329XXD Fracture of unspecified parts of lumbosacral spine and pelvis, subsequent encounter for fracture with routine healing: Secondary | ICD-10-CM

## 2014-08-17 DIAGNOSIS — E0789 Other specified disorders of thyroid: Secondary | ICD-10-CM

## 2014-08-17 DIAGNOSIS — E1129 Type 2 diabetes mellitus with other diabetic kidney complication: Secondary | ICD-10-CM

## 2014-08-17 DIAGNOSIS — I5032 Chronic diastolic (congestive) heart failure: Secondary | ICD-10-CM

## 2014-08-17 DIAGNOSIS — S32402D Unspecified fracture of left acetabulum, subsequent encounter for fracture with routine healing: Secondary | ICD-10-CM

## 2014-08-17 DIAGNOSIS — E034 Atrophy of thyroid (acquired): Secondary | ICD-10-CM

## 2014-08-17 DIAGNOSIS — N186 End stage renal disease: Secondary | ICD-10-CM

## 2014-08-17 DIAGNOSIS — E038 Other specified hypothyroidism: Secondary | ICD-10-CM

## 2014-08-17 DIAGNOSIS — N058 Unspecified nephritic syndrome with other morphologic changes: Secondary | ICD-10-CM

## 2014-08-17 DIAGNOSIS — E1121 Type 2 diabetes mellitus with diabetic nephropathy: Secondary | ICD-10-CM

## 2014-08-17 DIAGNOSIS — IMO0001 Reserved for inherently not codable concepts without codable children: Secondary | ICD-10-CM

## 2014-08-17 DIAGNOSIS — I10 Essential (primary) hypertension: Secondary | ICD-10-CM

## 2014-08-17 NOTE — Progress Notes (Signed)
MRN: [id number] Name: Nicole [name]  Sex: female Age: 79 y.o. DOB: Dec 26, 1934  [facility] Facility/Room: 111 Level Of Care: SNF Provider: Merrilee Seashore D Emergency Contacts: Extended Emergency Contact Information Primary Emergency Contact: Muston,Ken Address: [address]          [place], [place] [address] [place] Home Phone: [phone] Relation: Son Secondary Emergency Contact: Alean Rinne [place] Home Phone: [phone] Relation: Son  Code Status: FULL  Allergies: Ibuprofen; Prednisone; and Procrit  Chief Complaint  Patient presents with  . New Admit To SNF    HPI: Patient is 79 y.o. female who has ESRD on dialysis who  is admitted from [facility] to continue you her OT/PT for hip and pelvis fx treated without  Surgery.  Past Medical History  Diagnosis Date  . Retinopathy   . Hypertension   . Chronic diastolic CHF (congestive heart failure)     has a normal EF per echo 03/2011 & 04/2012 with diastolic dysfunction  . CAD (coronary artery disease)     Prior PCI in 1998 x 2; s/p cath in 2001, negative Myoview in 2008 & 2011, s/p cath in June 2012 with calcified disease of the proximal LCX with normal flow reserve. She has been managed medically due to her other morbidities  . Cerebrovascular disease   . Peripheral vascular disease     prior angiography showing RSFA stenosis, left anterior tibial and left posterior tibial stenoses  . Hypercholesterolemia   . Diabetes mellitus     insulin dependent  . Hypothyroidism   . Hiatal hernia   . Degenerative joint disease   . Osteoporosis   . Vitamin D deficiency   . Anemia of chronic disease   . ESRD (end stage renal disease)     a. CKD progressed to ESRD 10/2013. b. s/p LUE basilic transposition by vascular on 11/08/13.  . Dialysis patient     [facility], and Friday  . Hip fracture, left   . Acute myocardial infarction of other lateral wall, initial episode of care      Past Surgical History  Procedure Laterality Date  . Abdominal aortogram  07/27/2003    by Dr. Chales Abrahams  . Bilateral lower extremity angiogram  07/27/2003    by Dr. Chales Abrahams  . Selective angiography,right superficial femoral artery, right and left iliac arteries  07/27/2003    by Dr. Chales Abrahams  . Measurement of gradient right and left iliac arteries  07/27/2003    by Dr. Chales Abrahams  . Bilateral cataract sugery  2000 and 2002    by Dr. Cecilie Kicks  . Repair left hip fracture  04/2008    by Dr. Charlann Boxer  . Bascilic vein transposition Left 11/08/2013    Procedure: BASCILIC VEIN TRANSPOSITION;  Surgeon: Pryor Ochoa, MD;  [facility];  Service: Vascular;  Laterality: Left;  Marland Kitchen Eye surgery      Vision loss in Right Eye      Medication List       This list is accurate as of: 08/17/14 11:59 PM.  Always use your most recent med list.               acetaminophen 650 MG CR tablet  Commonly known as:  TYLENOL  Take 650 mg by mouth every 8 (eight) hours as needed for pain.     amLODipine 10 MG tablet  Commonly known as:  NORVASC  Take 1 tablet (10 mg total) by mouth daily.  aspirin EC 81 MG tablet  Take 81 mg by mouth daily.     atorvastatin 20 MG tablet  Commonly known as:  LIPITOR  Take 1 tablet (20 mg total) by mouth daily at 6 PM.     carvedilol 12.5 MG tablet  Commonly known as:  COREG  Take 1 tablet (12.5 mg total) by mouth 2 (two) times daily with a meal.     cloNIDine 0.1 MG tablet  Commonly known as:  CATAPRES  Take 0.1 mg by mouth daily at 10 pm.     DSS 100 MG Caps  Take 100 mg by mouth 2 (two) times daily.     famotidine 20 MG tablet  Commonly known as:  PEPCID  Take 20 mg by mouth daily.     HEPARIN SODIUM (BOVINE) IJ  Inject 5,000 Units as directed 3 (three) times daily.     hydrALAZINE 50 MG tablet  Commonly known as:  APRESOLINE  Take 50 mg by mouth every 6 (six) hours.     HYDROcodone-acetaminophen 5-325 MG per tablet  Commonly known as:  NORCO/VICODIN  Take  1-2 tablets by mouth every 6 (six) hours as needed for moderate pain.     insulin aspart 100 UNIT/ML injection  Commonly known as:  novoLOG  - Inject 0-9 Units into the skin 3 (three) times daily with meals. CBG < 70: implement hypoglycemia protocol  - CBG 70 - 120: 0 units  - CBG 121 - 150: 1 unit  - CBG 151 - 200: 2 units  - CBG 201 - 250: 3 units  - CBG 251 - 300: 5 units  - CBG 301 - 350: 7 units  - CBG 351 - 400: 9 units  - CBG > 400: call MD.     insulin aspart 100 UNIT/ML injection  Commonly known as:  novoLOG  Inject 5 Units into the skin 2 (two) times daily. Before breakfast and before lunch     insulin glargine 100 UNIT/ML injection  Commonly known as:  LANTUS  Inject 12 Units into the skin at bedtime.     isosorbide mononitrate 20 MG tablet  Commonly known as:  ISMO,MONOKET  Take 1 tablet (20 mg total) by mouth 2 (two) times daily.     levothyroxine 88 MCG tablet  Commonly known as:  SYNTHROID, LEVOTHROID  Take 88 mcg by mouth daily before breakfast.     ondansetron 4 MG tablet  Commonly known as:  ZOFRAN  Take 4 mg by mouth every 6 (six) hours as needed for nausea or vomiting.     senna-docusate 8.6-50 MG per tablet  Commonly known as:  Senokot-S  Take 2 tablets by mouth 2 (two) times daily.     SEVELAMER HCL PO  Take 1,600 mg by mouth. With every meal     Vitamin D 1000 UNITS capsule  Take 1,000 Units by mouth daily.        No orders of the defined types were placed in this encounter.    Immunization History  Administered Date(s) Administered  . Influenza Split 09/01/2011, 08/25/2012  . Influenza Whole 09/12/2008, 09/03/2009, 08/26/2010  . Influenza,inj,Quad PF,36+ Mos 08/16/2013, 08/21/2014  . Pneumococcal Polysaccharide-23 04/23/2009    History  Substance Use Topics  . Smoking status: Former Smoker    Types: Cigarettes    Quit date: 12/07/1974  . Smokeless tobacco: Never Used     Comment: pt started smoking in 1956  . Alcohol  Use: No    Family  history is noncontributory    Review of Systems  DATA OBTAINED: from patient; no c/o; nurse c/o low BS at 4am and pt admits it happens at home as well;per nurse insulin with snack GENERAL: Feels well no fevers, fatigue, appetite changes SKIN: No itching, rash  EYES: No eye pain, redness, discharge EARS: No earache, tinnitus, change in hearing NOSE: No congestion, drainage or bleeding  MOUTH/THROAT: No mouth or tooth pain, No sore throat  RESPIRATORY: No cough, wheezing, SOB CARDIAC: No chest pain, palpitations, lower extremity edema  GI: No abdominal pain, No N/V/D or constipation, No heartburn or reflux  GU: No dysuria, frequency or urgency, or incontinence  MUSCULOSKELETAL: No unrelieved bone/joint pain NEUROLOGIC: No headache, dizziness or focal weakness PSYCHIATRIC: No overt anxiety or sadness. Sleeps well. No behavior issue.   Filed Vitals:   08/22/14 1951  BP: 131/79  Pulse: 67  Temp: 97.7 F (36.5 C)  Resp: 18    Physical Exam  GENERAL APPEARANCE: Alert, conversant. Appropriately groomed. No acute distress, appearing much better than expected  SKIN: No diaphoresis rash HEAD: Normocephalic, atraumatic  EYES: Conjunctiva/lids clear. Pupils round, reactive. EOMs intact.  EARS: External exam WNL, canals clear. Hearing grossly normal.  NOSE: No deformity or discharge.  MOUTH/THROAT: Lips w/o lesions  RESPIRATORY: Breathing is even, unlabored. Lung sounds are clear   CARDIOVASCULAR: Heart RRR no murmurs, rubs or gallops. No peripheral edema.   GASTROINTESTINAL: Abdomen is soft, non-tender, not distended w/ normal bowel sounds GENITOURINARY: Bladder non tender, not distended  MUSCULOSKELETAL: No abnormal joints or musculature NEUROLOGIC: Oriented X3. Cranial nerves 2-12 grossly intact. Moves all extremities PSYCHIATRIC: Mood and affect appropriate to situation, no behavioral issues  Patient Active Problem List   Diagnosis Date Noted  . Acute  myocardial infarction of other lateral wall, initial episode of care   . DKA (diabetic ketoacidoses) 08/20/2014  . Acute pulmonary edema 08/20/2014  . Chest pain 08/20/2014  . Hyperosmolar non-ketotic state in patient with type 2 diabetes mellitus 08/20/2014  . Protein-calorie malnutrition, severe 07/23/2014  . Hip fracture 07/22/2014  . Acetabulum fracture, left 07/22/2014  . Pelvic fracture 07/22/2014  . Acetabular fracture 07/22/2014  . End stage renal disease 12/26/2013  . Hypothyroidism   . T2DM (type 2 diabetes mellitus) 10/29/2013  . Celiac artery stenosis 08/20/2013  . Mesenteric artery stenosis 08/20/2013  . AKI (acute kidney injury) 08/13/2013  . ESRD (end stage renal disease) 04/26/2013  . PAD (peripheral artery disease) 02/15/2013  . Type 2 diabetes mellitus 02/15/2013  . Chronic diastolic heart failure 04/06/2012  . Laryngopharyngeal reflux disease 02/29/2012  . GERD (gastroesophageal reflux disease) 02/29/2012  . Diabetic peripheral neuropathy associated with type 2 diabetes mellitus 02/18/2012  . Edema of both legs 01/21/2012  . RETINOPATHY 08/31/2009  . VITAMIN D DEFICIENCY 12/12/2008  . HYPERCHOLESTEROLEMIA 01/24/2008  . ANEMIA OF CHRONIC DISEASE 01/24/2008  . HYPERTENSION 01/24/2008  . CEREBROVASCULAR DISEASE 01/24/2008  . Peripheral vascular disease with pain at rest 01/24/2008  . HIATAL HERNIA 01/24/2008  . DEGENERATIVE JOINT DISEASE 01/24/2008  . OSTEOPOROSIS 01/24/2008  . HYPOTHYROIDISM 01/23/2008  . CAD 01/23/2008  . DM (diabetes mellitus), type 2 with renal complications 11/18/1955    CBC    Component Value Date/Time   WBC 6.9 08/22/2014 0257   WBC 9.3 08/08/2014   RBC 2.20* 08/22/2014 0257   RBC 3.48* 03/20/2011 1715   HGB 7.2* 08/22/2014 0257   HCT 21.6* 08/22/2014 0257   PLT 166 08/22/2014 0257   MCV 98.2 08/22/2014 0257  LYMPHSABS 0.6* 08/20/2014 0821   MONOABS 0.7 08/20/2014 0821   EOSABS 0.0 08/20/2014 0821   BASOSABS 0.0 08/20/2014 0821     CMP     Component Value Date/Time   NA 132* 08/22/2014 1059   NA 131* 08/08/2014   K 4.2 08/22/2014 1059   CL 92* 08/22/2014 1059   CO2 25 08/22/2014 1059   GLUCOSE 88 08/22/2014 1059   BUN 30* 08/22/2014 1059   BUN 44* 08/08/2014   CREATININE 3.21* 08/22/2014 1059   CREATININE 3.8* 08/08/2014   CALCIUM 8.5 08/22/2014 1059   CALCIUM 8.1* 11/07/2013 1320   PROT 6.7 05/22/2014 1421   ALBUMIN 2.8* 08/20/2014 1300   AST 28 08/08/2014   ALT 30 08/08/2014   ALKPHOS 185* 08/08/2014   BILITOT 0.9 05/22/2014 1421   GFRNONAA 13* 08/22/2014 1059   GFRAA 15* 08/22/2014 1059    Assessment and Plan  Acetabulum fracture, left Per ortho at [facility], 8 weeks non weight bearing, some at  at [facility]  Pelvic fracture As above, no surgery, non weight bearing  ESRD (end stage renal disease) On dialysis which started 11/2013  Chronic diastolic heart failure Did not have any problems with this during hospitalization at [facility]; on ASA, Statin, bblocker  HYPERTENSION Controlled on multiple agents  DM (diabetes mellitus), type 2 with renal complications Reported problems with highs and lows at [facility];nursing here reports lows at 4am; will d/c novolog with snack and continue same levemir; A1c 8.6 at [facility] 07/2014  Hypothyroidism Continue levothyroxine  HYPERCHOLESTEROLEMIA LDL -70 and HDL 60 on crestor 20 mg in 05/2014    Margit Hanks, MD

## 2014-08-20 ENCOUNTER — Encounter (HOSPITAL_COMMUNITY): Payer: Self-pay | Admitting: Emergency Medicine

## 2014-08-20 ENCOUNTER — Inpatient Hospital Stay (HOSPITAL_COMMUNITY)
Admission: EM | Admit: 2014-08-20 | Discharge: 2014-08-25 | DRG: 246 | Disposition: A | Discharge status: Skilled Nursing Facility | Payer: Medicare Other | Attending: Internal Medicine | Admitting: Internal Medicine

## 2014-08-20 ENCOUNTER — Emergency Department (HOSPITAL_COMMUNITY): Payer: Medicare Other

## 2014-08-20 ENCOUNTER — Encounter (HOSPITAL_COMMUNITY)
Admission: EM | Disposition: A | Discharge status: Skilled Nursing Facility | Payer: Self-pay | Source: Home / Self Care | Attending: Internal Medicine

## 2014-08-20 DIAGNOSIS — M810 Age-related osteoporosis without current pathological fracture: Secondary | ICD-10-CM | POA: Diagnosis present

## 2014-08-20 DIAGNOSIS — E875 Hyperkalemia: Secondary | ICD-10-CM

## 2014-08-20 DIAGNOSIS — E1100 Type 2 diabetes mellitus with hyperosmolarity without nonketotic hyperglycemic-hyperosmolar coma (NKHHC): Secondary | ICD-10-CM | POA: Diagnosis present

## 2014-08-20 DIAGNOSIS — N2581 Secondary hyperparathyroidism of renal origin: Secondary | ICD-10-CM | POA: Diagnosis present

## 2014-08-20 DIAGNOSIS — N186 End stage renal disease: Secondary | ICD-10-CM | POA: Diagnosis present

## 2014-08-20 DIAGNOSIS — L89609 Pressure ulcer of unspecified heel, unspecified stage: Secondary | ICD-10-CM | POA: Diagnosis present

## 2014-08-20 DIAGNOSIS — E559 Vitamin D deficiency, unspecified: Secondary | ICD-10-CM | POA: Diagnosis present

## 2014-08-20 DIAGNOSIS — E871 Hypo-osmolality and hyponatremia: Secondary | ICD-10-CM | POA: Diagnosis present

## 2014-08-20 DIAGNOSIS — D539 Nutritional anemia, unspecified: Secondary | ICD-10-CM | POA: Diagnosis present

## 2014-08-20 DIAGNOSIS — I120 Hypertensive chronic kidney disease with stage 5 chronic kidney disease or end stage renal disease: Secondary | ICD-10-CM | POA: Diagnosis present

## 2014-08-20 DIAGNOSIS — Z9861 Coronary angioplasty status: Secondary | ICD-10-CM

## 2014-08-20 DIAGNOSIS — H3500 Unspecified background retinopathy: Secondary | ICD-10-CM | POA: Diagnosis present

## 2014-08-20 DIAGNOSIS — Z681 Body mass index (BMI) 19 or less, adult: Secondary | ICD-10-CM

## 2014-08-20 DIAGNOSIS — IMO0002 Reserved for concepts with insufficient information to code with codable children: Secondary | ICD-10-CM

## 2014-08-20 DIAGNOSIS — E1142 Type 2 diabetes mellitus with diabetic polyneuropathy: Secondary | ICD-10-CM

## 2014-08-20 DIAGNOSIS — I739 Peripheral vascular disease, unspecified: Secondary | ICD-10-CM

## 2014-08-20 DIAGNOSIS — D638 Anemia in other chronic diseases classified elsewhere: Secondary | ICD-10-CM | POA: Diagnosis present

## 2014-08-20 DIAGNOSIS — E7800 Pure hypercholesterolemia, unspecified: Secondary | ICD-10-CM | POA: Diagnosis present

## 2014-08-20 DIAGNOSIS — E1149 Type 2 diabetes mellitus with other diabetic neurological complication: Secondary | ICD-10-CM | POA: Diagnosis not present

## 2014-08-20 DIAGNOSIS — E039 Hypothyroidism, unspecified: Secondary | ICD-10-CM

## 2014-08-20 DIAGNOSIS — H5460 Unqualified visual loss, one eye, unspecified: Secondary | ICD-10-CM | POA: Diagnosis present

## 2014-08-20 DIAGNOSIS — Z794 Long term (current) use of insulin: Secondary | ICD-10-CM

## 2014-08-20 DIAGNOSIS — N058 Unspecified nephritic syndrome with other morphologic changes: Secondary | ICD-10-CM | POA: Diagnosis present

## 2014-08-20 DIAGNOSIS — E1101 Type 2 diabetes mellitus with hyperosmolarity with coma: Secondary | ICD-10-CM

## 2014-08-20 DIAGNOSIS — Z7982 Long term (current) use of aspirin: Secondary | ICD-10-CM

## 2014-08-20 DIAGNOSIS — D509 Iron deficiency anemia, unspecified: Secondary | ICD-10-CM

## 2014-08-20 DIAGNOSIS — E1129 Type 2 diabetes mellitus with other diabetic kidney complication: Secondary | ICD-10-CM | POA: Diagnosis present

## 2014-08-20 DIAGNOSIS — I059 Rheumatic mitral valve disease, unspecified: Secondary | ICD-10-CM

## 2014-08-20 DIAGNOSIS — Z87891 Personal history of nicotine dependence: Secondary | ICD-10-CM | POA: Diagnosis not present

## 2014-08-20 DIAGNOSIS — E785 Hyperlipidemia, unspecified: Secondary | ICD-10-CM | POA: Diagnosis present

## 2014-08-20 DIAGNOSIS — R079 Chest pain, unspecified: Secondary | ICD-10-CM | POA: Diagnosis not present

## 2014-08-20 DIAGNOSIS — I509 Heart failure, unspecified: Secondary | ICD-10-CM | POA: Diagnosis present

## 2014-08-20 DIAGNOSIS — L8991 Pressure ulcer of unspecified site, stage 1: Secondary | ICD-10-CM | POA: Diagnosis present

## 2014-08-20 DIAGNOSIS — M899 Disorder of bone, unspecified: Secondary | ICD-10-CM | POA: Diagnosis present

## 2014-08-20 DIAGNOSIS — I214 Non-ST elevation (NSTEMI) myocardial infarction: Secondary | ICD-10-CM | POA: Diagnosis present

## 2014-08-20 DIAGNOSIS — S32409S Unspecified fracture of unspecified acetabulum, sequela: Secondary | ICD-10-CM

## 2014-08-20 DIAGNOSIS — Z992 Dependence on renal dialysis: Secondary | ICD-10-CM | POA: Diagnosis not present

## 2014-08-20 DIAGNOSIS — M949 Disorder of cartilage, unspecified: Secondary | ICD-10-CM

## 2014-08-20 DIAGNOSIS — I2510 Atherosclerotic heart disease of native coronary artery without angina pectoris: Secondary | ICD-10-CM

## 2014-08-20 DIAGNOSIS — E1310 Other specified diabetes mellitus with ketoacidosis without coma: Secondary | ICD-10-CM | POA: Diagnosis not present

## 2014-08-20 DIAGNOSIS — J810 Acute pulmonary edema: Secondary | ICD-10-CM

## 2014-08-20 DIAGNOSIS — I5033 Acute on chronic diastolic (congestive) heart failure: Secondary | ICD-10-CM | POA: Diagnosis present

## 2014-08-20 DIAGNOSIS — I10 Essential (primary) hypertension: Secondary | ICD-10-CM

## 2014-08-20 DIAGNOSIS — E0810 Diabetes mellitus due to underlying condition with ketoacidosis without coma: Secondary | ICD-10-CM

## 2014-08-20 DIAGNOSIS — I200 Unstable angina: Secondary | ICD-10-CM

## 2014-08-20 DIAGNOSIS — E1110 Type 2 diabetes mellitus with ketoacidosis without coma: Secondary | ICD-10-CM | POA: Insufficient documentation

## 2014-08-20 DIAGNOSIS — I2129 ST elevation (STEMI) myocardial infarction involving other sites: Secondary | ICD-10-CM

## 2014-08-20 DIAGNOSIS — E1165 Type 2 diabetes mellitus with hyperglycemia: Secondary | ICD-10-CM | POA: Diagnosis present

## 2014-08-20 DIAGNOSIS — I5032 Chronic diastolic (congestive) heart failure: Secondary | ICD-10-CM

## 2014-08-20 HISTORY — PX: LEFT HEART CATHETERIZATION WITH CORONARY ANGIOGRAM: SHX5451

## 2014-08-20 LAB — GLUCOSE, CAPILLARY
GLUCOSE-CAPILLARY: 336 mg/dL — AB (ref 70–99)
GLUCOSE-CAPILLARY: 56 mg/dL — AB (ref 70–99)
GLUCOSE-CAPILLARY: 66 mg/dL — AB (ref 70–99)
Glucose-Capillary: >600 mg/dL (ref 70–99)
Glucose-Capillary: >600 mg/dL (ref 70–99)
Glucose-Capillary: 207 mg/dL — ABNORMAL HIGH (ref 70–99)
Glucose-Capillary: 182 mg/dL — ABNORMAL HIGH (ref 70–99)
Glucose-Capillary: 160 mg/dL — ABNORMAL HIGH (ref 70–99)
Glucose-Capillary: 105 mg/dL — ABNORMAL HIGH (ref 70–99)
Glucose-Capillary: 124 mg/dL — ABNORMAL HIGH (ref 70–99)
Glucose-Capillary: 58 mg/dL — ABNORMAL LOW (ref 70–99)
Glucose-Capillary: 108 mg/dL — ABNORMAL HIGH (ref 70–99)

## 2014-08-20 LAB — CBC WITH DIFFERENTIAL/PLATELET
BASOS PCT: 0 % (ref 0–1)
BASOS PCT: 0 % (ref 0–1)
Basophils Absolute: 0 10*3/uL (ref 0.0–0.1)
Basophils Absolute: 0 10*3/uL (ref 0.0–0.1)
EOS PCT: 1 % (ref 0–5)
EOS PCT: 0 % (ref 0–5)
Eosinophils Absolute: 0.1 10*3/uL (ref 0.0–0.7)
Eosinophils Absolute: 0 10*3/uL (ref 0.0–0.7)
HCT: 25.6 % — ABNORMAL LOW (ref 36.0–46.0)
HEMATOCRIT: 30.7 % — AB (ref 36.0–46.0)
HEMOGLOBIN: 8.8 g/dL — AB (ref 12.0–15.0)
Hemoglobin: 9.8 g/dL — ABNORMAL LOW (ref 12.0–15.0)
LYMPHS ABS: 0.6 10*3/uL — AB (ref 0.7–4.0)
Lymphocytes Relative: 6 % — ABNORMAL LOW (ref 12–46)
Lymphocytes Relative: 7 % — ABNORMAL LOW (ref 12–46)
Lymphs Abs: 0.5 10*3/uL — ABNORMAL LOW (ref 0.7–4.0)
MCH: 32.7 pg (ref 26.0–34.0)
MCH: 33.1 pg (ref 26.0–34.0)
MCHC: 31.9 g/dL (ref 30.0–36.0)
MCHC: 34.4 g/dL (ref 30.0–36.0)
MCV: 102.3 fL — ABNORMAL HIGH (ref 78.0–100.0)
MCV: 96.2 fL (ref 78.0–100.0)
MONO ABS: 0.3 10*3/uL (ref 0.1–1.0)
MONO ABS: 0.7 10*3/uL (ref 0.1–1.0)
MONOS PCT: 7 % (ref 3–12)
Monocytes Relative: 3 % (ref 3–12)
NEUTROS PCT: 86 % — AB (ref 43–77)
Neutro Abs: 7.3 10*3/uL (ref 1.7–7.7)
Neutro Abs: 7.9 10*3/uL — ABNORMAL HIGH (ref 1.7–7.7)
Neutrophils Relative %: 90 % — ABNORMAL HIGH (ref 43–77)
PLATELETS: 197 10*3/uL (ref 150–400)
Platelets: 208 10*3/uL (ref 150–400)
RBC: 3 MIL/uL — ABNORMAL LOW (ref 3.87–5.11)
RBC: 2.66 MIL/uL — AB (ref 3.87–5.11)
RDW: 15.4 % (ref 11.5–15.5)
RDW: 15.2 % (ref 11.5–15.5)
WBC: 8.1 10*3/uL (ref 4.0–10.5)
WBC: 9.2 10*3/uL (ref 4.0–10.5)

## 2014-08-20 LAB — RENAL FUNCTION PANEL
Albumin: 2.8 g/dL — ABNORMAL LOW (ref 3.5–5.2)
Anion gap: 17 — ABNORMAL HIGH (ref 5–15)
BUN: 31 mg/dL — ABNORMAL HIGH (ref 6–23)
CALCIUM: 8.8 mg/dL (ref 8.4–10.5)
CO2: 25 mEq/L (ref 19–32)
Chloride: 98 mEq/L (ref 96–112)
Creatinine, Ser: 2.22 mg/dL — ABNORMAL HIGH (ref 0.50–1.10)
GFR calc Af Amer: 23 mL/min — ABNORMAL LOW (ref >90)
GFR calc non Af Amer: 20 mL/min — ABNORMAL LOW (ref >90)
GLUCOSE: 212 mg/dL — AB (ref 70–99)
POTASSIUM: 2.8 meq/L — AB (ref 3.7–5.3)
Phosphorus: 1.3 mg/dL — ABNORMAL LOW (ref 2.3–4.6)
Sodium: 140 mEq/L (ref 137–147)

## 2014-08-20 LAB — BASIC METABOLIC PANEL
ANION GAP: 19 — AB (ref 5–15)
Anion gap: 21 — ABNORMAL HIGH (ref 5–15)
Anion gap: 15 (ref 5–15)
BUN: 75 mg/dL — ABNORMAL HIGH (ref 6–23)
BUN: 78 mg/dL — ABNORMAL HIGH (ref 6–23)
BUN: 46 mg/dL — ABNORMAL HIGH (ref 6–23)
CALCIUM: 8.7 mg/dL (ref 8.4–10.5)
CALCIUM: 9 mg/dL (ref 8.4–10.5)
CALCIUM: 9.2 mg/dL (ref 8.4–10.5)
CO2: 21 mEq/L (ref 19–32)
CO2: 21 mEq/L (ref 19–32)
CO2: 26 mEq/L (ref 19–32)
CREATININE: 4.76 mg/dL — AB (ref 0.50–1.10)
CREATININE: 5.04 mg/dL — AB (ref 0.50–1.10)
CREATININE: 3.44 mg/dL — AB (ref 0.50–1.10)
Chloride: 84 mEq/L — ABNORMAL LOW (ref 96–112)
Chloride: 88 mEq/L — ABNORMAL LOW (ref 96–112)
Chloride: 98 mEq/L (ref 96–112)
GFR calc non Af Amer: 8 mL/min — ABNORMAL LOW (ref >90)
GFR calc non Af Amer: 7 mL/min — ABNORMAL LOW (ref >90)
GFR, EST AFRICAN AMERICAN: 9 mL/min — AB (ref >90)
GFR, EST AFRICAN AMERICAN: 9 mL/min — AB (ref >90)
GFR, EST AFRICAN AMERICAN: 14 mL/min — AB (ref >90)
GFR, EST NON AFRICAN AMERICAN: 12 mL/min — AB (ref >90)
GLUCOSE: 630 mg/dL — AB (ref 70–99)
Glucose, Bld: 1027 mg/dL (ref 70–99)
Glucose, Bld: 71 mg/dL (ref 70–99)
POTASSIUM: 4.1 meq/L (ref 3.7–5.3)
POTASSIUM: 3.6 meq/L — AB (ref 3.7–5.3)
Potassium: 6.1 mEq/L — ABNORMAL HIGH (ref 3.7–5.3)
SODIUM: 124 meq/L — AB (ref 137–147)
Sodium: 130 mEq/L — ABNORMAL LOW (ref 137–147)
Sodium: 139 mEq/L (ref 137–147)

## 2014-08-20 LAB — POTASSIUM: Potassium: 3.7 mEq/L (ref 3.7–5.3)

## 2014-08-20 LAB — CBC
HEMATOCRIT: 26 % — AB (ref 36.0–46.0)
Hemoglobin: 9.1 g/dL — ABNORMAL LOW (ref 12.0–15.0)
MCH: 33 pg (ref 26.0–34.0)
MCHC: 35 g/dL (ref 30.0–36.0)
MCV: 94.2 fL (ref 78.0–100.0)
Platelets: 200 10*3/uL (ref 150–400)
RBC: 2.76 MIL/uL — ABNORMAL LOW (ref 3.87–5.11)
RDW: 15.3 % (ref 11.5–15.5)
WBC: 10.6 10*3/uL — AB (ref 4.0–10.5)

## 2014-08-20 LAB — PRO B NATRIURETIC PEPTIDE: Pro B Natriuretic peptide (BNP): 9463 pg/mL — ABNORMAL HIGH (ref 0–450)

## 2014-08-20 LAB — I-STAT TROPONIN, ED: TROPONIN I, POC: 0.07 ng/mL (ref 0.00–0.08)

## 2014-08-20 LAB — TROPONIN I
Troponin I: 0.93 ng/mL (ref <0.30)
Troponin I: 1.22 ng/mL (ref <0.30)

## 2014-08-20 LAB — POCT ACTIVATED CLOTTING TIME: Activated Clotting Time: 242 seconds

## 2014-08-20 LAB — MRSA PCR SCREENING: MRSA by PCR: NEGATIVE

## 2014-08-20 SURGERY — LEFT HEART CATHETERIZATION WITH CORONARY ANGIOGRAM
Anesthesia: LOCAL

## 2014-08-20 MED ORDER — SODIUM POLYSTYRENE SULFONATE 15 GM/60ML PO SUSP
30.0000 g | Freq: Once | ORAL | Status: AC
  Administered 2014-08-20: 30 g via ORAL
  Filled 2014-08-20: qty 120

## 2014-08-20 MED ORDER — ONDANSETRON HCL 4 MG/2ML IJ SOLN
4.0000 mg | Freq: Once | INTRAMUSCULAR | Status: AC
  Administered 2014-08-20: 4 mg via INTRAVENOUS

## 2014-08-20 MED ORDER — SODIUM CHLORIDE 0.9 % IJ SOLN: 3.0000 mL | INTRAMUSCULAR | Status: DC | PRN

## 2014-08-20 MED ORDER — ASPIRIN 325 MG PO TABS
325.0000 mg | ORAL_TABLET | Freq: Every day | ORAL | Status: DC
  Filled 2014-08-20: qty 1

## 2014-08-20 MED ORDER — SODIUM CHLORIDE 0.9 % IJ SOLN: 3.0000 mL | Freq: Two times a day (BID) | INTRAMUSCULAR | Status: DC

## 2014-08-20 MED ORDER — HEPARIN SODIUM (PORCINE) 1000 UNIT/ML DIALYSIS: 2000.0000 [IU] | Freq: Once | INTRAMUSCULAR | Status: DC

## 2014-08-20 MED ORDER — ISOSORBIDE MONONITRATE 20 MG PO TABS
20.0000 mg | ORAL_TABLET | Freq: Two times a day (BID) | ORAL | Status: DC
  Administered 2014-08-20 – 2014-08-25 (×9): 20 mg via ORAL
  Filled 2014-08-20 (×15): qty 1

## 2014-08-20 MED ORDER — INSULIN ASPART 100 UNIT/ML ~~LOC~~ SOLN
0.0000 [IU] | SUBCUTANEOUS | Status: DC
  Administered 2014-08-21 (×2): 2 [IU] via SUBCUTANEOUS
  Administered 2014-08-21: 5 [IU] via SUBCUTANEOUS

## 2014-08-20 MED ORDER — ASPIRIN 81 MG PO CHEW
CHEWABLE_TABLET | ORAL | Status: AC
  Filled 2014-08-20: qty 4

## 2014-08-20 MED ORDER — DOCUSATE SODIUM 100 MG PO CAPS: 100.0000 mg | ORAL_CAPSULE | Freq: Two times a day (BID) | ORAL | Status: DC

## 2014-08-20 MED ORDER — ASPIRIN 81 MG PO CHEW: 81.0000 mg | CHEWABLE_TABLET | ORAL | Status: DC

## 2014-08-20 MED ORDER — PENTAFLUOROPROP-TETRAFLUOROETH EX AERO: 1.0000 "application " | INHALATION_SPRAY | CUTANEOUS | Status: DC | PRN

## 2014-08-20 MED ORDER — ALTEPLASE 2 MG IJ SOLR: 2.0000 mg | Freq: Once | INTRAMUSCULAR | Status: DC | PRN

## 2014-08-20 MED ORDER — SODIUM CHLORIDE 0.9 % IV SOLN
INTRAVENOUS | Status: DC
  Filled 2014-08-20: qty 2.5

## 2014-08-20 MED ORDER — LEVOTHYROXINE SODIUM 88 MCG PO TABS
88.0000 ug | ORAL_TABLET | Freq: Every day | ORAL | Status: DC
  Administered 2014-08-20 – 2014-08-25 (×6): 88 ug via ORAL
  Filled 2014-08-20 (×10): qty 1

## 2014-08-20 MED ORDER — FAMOTIDINE 20 MG PO TABS
20.0000 mg | ORAL_TABLET | Freq: Every day | ORAL | Status: DC
  Administered 2014-08-20 – 2014-08-25 (×6): 20 mg via ORAL
  Filled 2014-08-20 (×7): qty 1

## 2014-08-20 MED ORDER — HEPARIN (PORCINE) IN NACL 2-0.9 UNIT/ML-% IJ SOLN
INTRAMUSCULAR | Status: AC
  Filled 2014-08-20: qty 1000

## 2014-08-20 MED ORDER — HEPARIN SODIUM (PORCINE) 1000 UNIT/ML IJ SOLN
INTRAMUSCULAR | Status: AC
  Filled 2014-08-20: qty 1

## 2014-08-20 MED ORDER — DEXTROSE-NACL 5-0.45 % IV SOLN: INTRAVENOUS | Status: DC

## 2014-08-20 MED ORDER — POTASSIUM CHLORIDE CRYS ER 20 MEQ PO TBCR
40.0000 meq | EXTENDED_RELEASE_TABLET | Freq: Once | ORAL | Status: AC
  Administered 2014-08-20: 40 meq via ORAL
  Filled 2014-08-20: qty 2

## 2014-08-20 MED ORDER — HYDRALAZINE HCL 50 MG PO TABS
50.0000 mg | ORAL_TABLET | Freq: Four times a day (QID) | ORAL | Status: DC
  Filled 2014-08-20 (×4): qty 1

## 2014-08-20 MED ORDER — RENA-VITE PO TABS
1.0000 | ORAL_TABLET | Freq: Every day | ORAL | Status: DC
  Administered 2014-08-20 – 2014-08-24 (×5): 1 via ORAL
  Filled 2014-08-20 (×9): qty 1

## 2014-08-20 MED ORDER — HEPARIN SODIUM (PORCINE) 5000 UNIT/ML IJ SOLN: 5000.0000 [IU] | Freq: Three times a day (TID) | INTRAMUSCULAR | Status: DC

## 2014-08-20 MED ORDER — LIDOCAINE-PRILOCAINE 2.5-2.5 % EX CREA: 1.0000 "application " | TOPICAL_CREAM | CUTANEOUS | Status: DC | PRN

## 2014-08-20 MED ORDER — DARBEPOETIN ALFA-POLYSORBATE 25 MCG/0.42ML IJ SOLN: 25.0000 ug | INTRAMUSCULAR | Status: DC

## 2014-08-20 MED ORDER — SODIUM CHLORIDE 0.9 % IV SOLN: 100.0000 mL | INTRAVENOUS | Status: DC | PRN

## 2014-08-20 MED ORDER — HEPARIN BOLUS VIA INFUSION
2000.0000 [IU] | Freq: Once | INTRAVENOUS | Status: AC
  Administered 2014-08-20: 2000 [IU] via INTRAVENOUS
  Filled 2014-08-20: qty 2000

## 2014-08-20 MED ORDER — LIDOCAINE HCL (PF) 1 % IJ SOLN
INTRAMUSCULAR | Status: AC
  Filled 2014-08-20: qty 30

## 2014-08-20 MED ORDER — HYDROCODONE-ACETAMINOPHEN 5-325 MG PO TABS
1.0000 | ORAL_TABLET | Freq: Four times a day (QID) | ORAL | Status: DC | PRN
  Administered 2014-08-21 – 2014-08-24 (×4): 1 via ORAL
  Filled 2014-08-20 (×5): qty 1

## 2014-08-20 MED ORDER — NITROGLYCERIN 1 MG/10 ML FOR IR/CATH LAB
INTRA_ARTERIAL | Status: AC
  Filled 2014-08-20: qty 10

## 2014-08-20 MED ORDER — TICAGRELOR 90 MG PO TABS
ORAL_TABLET | ORAL | Status: AC
  Filled 2014-08-20: qty 2

## 2014-08-20 MED ORDER — AMLODIPINE BESYLATE 10 MG PO TABS
10.0000 mg | ORAL_TABLET | Freq: Every day | ORAL | Status: DC
  Administered 2014-08-21 – 2014-08-25 (×4): 10 mg via ORAL
  Filled 2014-08-20 (×7): qty 1

## 2014-08-20 MED ORDER — NITROGLYCERIN 2 % TD OINT
1.0000 [in_us] | TOPICAL_OINTMENT | Freq: Once | TRANSDERMAL | Status: AC
  Administered 2014-08-20: 1 [in_us] via TOPICAL
  Filled 2014-08-20: qty 1

## 2014-08-20 MED ORDER — SODIUM CHLORIDE 0.9 % IV SOLN: 250.0000 mL | INTRAVENOUS | Status: DC | PRN

## 2014-08-20 MED ORDER — ACETAMINOPHEN 325 MG PO TABS
650.0000 mg | ORAL_TABLET | ORAL | Status: DC | PRN
Start: 2014-08-20 — End: 2014-08-25
  Administered 2014-08-21 – 2014-08-22 (×2): 650 mg via ORAL
  Filled 2014-08-20 (×2): qty 2

## 2014-08-20 MED ORDER — SODIUM CHLORIDE 0.9 % IV SOLN: INTRAVENOUS | Status: DC

## 2014-08-20 MED ORDER — ATORVASTATIN CALCIUM 20 MG PO TABS
20.0000 mg | ORAL_TABLET | Freq: Every day | ORAL | Status: DC
  Administered 2014-08-20 – 2014-08-24 (×5): 20 mg via ORAL
  Filled 2014-08-20 (×8): qty 1

## 2014-08-20 MED ORDER — CARVEDILOL 12.5 MG PO TABS
12.5000 mg | ORAL_TABLET | Freq: Two times a day (BID) | ORAL | Status: DC
  Administered 2014-08-21 – 2014-08-25 (×7): 12.5 mg via ORAL
  Filled 2014-08-20 (×17): qty 1

## 2014-08-20 MED ORDER — ONDANSETRON HCL 4 MG/2ML IJ SOLN: 4.0000 mg | Freq: Four times a day (QID) | INTRAMUSCULAR | Status: DC | PRN

## 2014-08-20 MED ORDER — HEPARIN (PORCINE) IN NACL 100-0.45 UNIT/ML-% IJ SOLN
700.0000 [IU]/h | INTRAMUSCULAR | Status: DC
  Administered 2014-08-20: 700 [IU]/h via INTRAVENOUS
  Filled 2014-08-20: qty 250

## 2014-08-20 MED ORDER — DEXTROSE 50 % IV SOLN
25.0000 mL | INTRAVENOUS | Status: DC | PRN
  Administered 2014-08-20: 25 mL via INTRAVENOUS
  Administered 2014-08-22: 50 mL via INTRAVENOUS
  Filled 2014-08-20: qty 50

## 2014-08-20 MED ORDER — NEPRO/CARBSTEADY PO LIQD: 237.0000 mL | ORAL | Status: DC | PRN

## 2014-08-20 MED ORDER — LIDOCAINE HCL (PF) 1 % IJ SOLN: 5.0000 mL | INTRAMUSCULAR | Status: DC | PRN

## 2014-08-20 MED ORDER — HEPARIN SODIUM (PORCINE) 1000 UNIT/ML DIALYSIS: 1000.0000 [IU] | INTRAMUSCULAR | Status: DC | PRN

## 2014-08-20 MED ORDER — INSULIN REGULAR HUMAN 100 UNIT/ML IJ SOLN
INTRAMUSCULAR | Status: DC
  Administered 2014-08-20: 5.4 [IU]/h via INTRAVENOUS
  Filled 2014-08-20: qty 2.5

## 2014-08-20 MED ORDER — SENNOSIDES-DOCUSATE SODIUM 8.6-50 MG PO TABS
2.0000 | ORAL_TABLET | Freq: Two times a day (BID) | ORAL | Status: DC
  Administered 2014-08-20 – 2014-08-23 (×5): 2 via ORAL
  Filled 2014-08-20 (×15): qty 2

## 2014-08-20 MED ORDER — HYDRALAZINE HCL 25 MG PO TABS
75.0000 mg | ORAL_TABLET | Freq: Three times a day (TID) | ORAL | Status: DC
  Administered 2014-08-20 – 2014-08-25 (×12): 75 mg via ORAL
  Filled 2014-08-20 (×25): qty 1

## 2014-08-20 MED ORDER — INFLUENZA VAC SPLIT QUAD 0.5 ML IM SUSY
0.5000 mL | PREFILLED_SYRINGE | INTRAMUSCULAR | Status: AC
  Administered 2014-08-21: 0.5 mL via INTRAMUSCULAR
  Filled 2014-08-20 (×2): qty 0.5

## 2014-08-20 MED ORDER — ASPIRIN 81 MG PO CHEW
81.0000 mg | CHEWABLE_TABLET | Freq: Every day | ORAL | Status: DC
  Administered 2014-08-20 – 2014-08-25 (×5): 81 mg via ORAL
  Filled 2014-08-20 (×5): qty 1

## 2014-08-20 MED ORDER — CLONIDINE HCL 0.1 MG PO TABS
0.1000 mg | ORAL_TABLET | Freq: Every day | ORAL | Status: DC
  Administered 2014-08-20 – 2014-08-24 (×5): 0.1 mg via ORAL
  Filled 2014-08-20 (×9): qty 1

## 2014-08-20 MED ORDER — ONDANSETRON HCL 4 MG/2ML IJ SOLN
INTRAMUSCULAR | Status: AC
  Filled 2014-08-20: qty 2

## 2014-08-20 MED ORDER — SEVELAMER CARBONATE 800 MG PO TABS
1600.0000 mg | ORAL_TABLET | Freq: Three times a day (TID) | ORAL | Status: DC
Start: 2014-08-20 — End: 2014-08-25
  Administered 2014-08-21 – 2014-08-25 (×12): 1600 mg via ORAL
  Filled 2014-08-20 (×22): qty 2

## 2014-08-20 MED ORDER — TICAGRELOR 90 MG PO TABS
90.0000 mg | ORAL_TABLET | Freq: Two times a day (BID) | ORAL | Status: DC
  Administered 2014-08-21 – 2014-08-25 (×8): 90 mg via ORAL
  Filled 2014-08-20 (×16): qty 1

## 2014-08-20 MED ORDER — NITROGLYCERIN 2 % TD OINT
1.0000 [in_us] | TOPICAL_OINTMENT | Freq: Once | TRANSDERMAL | Status: AC
  Filled 2014-08-20: qty 30

## 2014-08-20 NOTE — Care Management Note (Signed)
    Page 1 of 1   08/24/2014     3:28:50 PM CARE MANAGEMENT NOTE 08/24/2014  Patient:  Nicole [name], Nicole [name]   Account Number:  [id number]  Date Initiated:  08/20/2014  Documentation initiated by:  GRAVES-BIGELOW,BRENDA  Subjective/Objective Assessment:   Pt admitted for Shortness of breath and chest pain. Increased blood sugar and placed on insulin gtt. Pt is from [facility].     Action/Plan:   CSW assisting with disposition needs.   Anticipated DC Date:  08/22/2014   Anticipated DC Plan:  SKILLED NURSING FACILITY  In-house referral  Clinical Social Worker      DC Planning Services  CM consult      Choice offered to / List presented to:             Status of service:  In process, will continue to follow Medicare Important Message given?  YES (If response is "NO", the following Medicare IM given date fields will be blank) Date Medicare IM given:  08/24/2014 Medicare IM given by:  [facility] Date Additional Medicare IM given:   Additional Medicare IM given by:    Discharge Disposition:    Per UR Regulation:  Reviewed for med. necessity/level of care/duration of stay  If discussed at Long Length of Stay Meetings, dates discussed:    Comments:

## 2014-08-20 NOTE — Progress Notes (Signed)
*  PRELIMINARY RESULTS* Echocardiogram 2D Echocardiogram has been performed.  Nicole [name] 08/20/2014, 3:45 PM

## 2014-08-20 NOTE — Progress Notes (Signed)
ANTICOAGULATION CONSULT NOTE - Initial Consult  Pharmacy Consult for heparin Indication: chest pain/ACS  Allergies  Allergen Reactions  . Ibuprofen     REACTION: edema---thyroid problems  . Prednisone     Causes severe fluid retention  . Procrit [Epoetin Alfa]     Per Dr. Eliane Decree note: she apparently has had an "adverse reaction" to Procrit after which she gets diffuse arthralgias and myalgias.    Patient Measurements: Height:  (162.6 cm) Weight: 112 lb 14 oz (51.2 kg) IBW/kg (Calculated) : 54.7   Vital Signs: Temp: 98 F (36.7 C) (09/14 1030) Temp src: Oral (09/14 1030) BP: 89/41 mmHg (09/14 1200) Pulse Rate: 74 (09/14 1200)  Labs:  Recent Labs  08/20/14 0359 08/20/14 0821  HGB 9.8* 8.8*  HCT 30.7* 25.6*  PLT 197 208  CREATININE 4.76*  --     Estimated Creatinine Clearance: 8 ml/min (by C-G formula based on Cr of 4.76).   Medical History: Past Medical History  Diagnosis Date  . Retinopathy   . Hypertension   . Chronic diastolic CHF (congestive heart failure)     has a normal EF per echo 03/2011 & 04/2012 with diastolic dysfunction  . CAD (coronary artery disease)     Prior PCI in 1998 x 2; s/p cath in 2001, negative Myoview in 2008 & 2011, s/p cath in June 2012 with calcified disease of the proximal LCX with normal flow reserve. She has been managed medically due to her other morbidities  . Cerebrovascular disease   . Peripheral vascular disease     prior angiography showing RSFA stenosis, left anterior tibial and left posterior tibial stenoses  . Hypercholesterolemia   . Diabetes mellitus     insulin dependent  . Hypothyroidism   . Hiatal hernia   . Degenerative joint disease   . Osteoporosis   . Vitamin D deficiency   . Anemia of chronic disease   . ESRD (end stage renal disease)     a. CKD progressed to ESRD 10/2013. b. s/p LUE basilic transposition by vascular on 11/08/13.  . Dialysis patient     Paulo Fruit, and Friday  . Hip fracture, left      Medications:  Prescriptions prior to admission  Medication Sig Dispense Refill  . acetaminophen (TYLENOL) 650 MG CR tablet Take 650 mg by mouth every 8 (eight) hours as needed for pain.      Marland Kitchen amLODipine (NORVASC) 10 MG tablet Take 1 tablet (10 mg total) by mouth daily.  30 tablet  0  . aspirin EC 81 MG tablet Take 81 mg by mouth daily.      Marland Kitchen atorvastatin (LIPITOR) 20 MG tablet Take 1 tablet (20 mg total) by mouth daily at 6 PM.  30 tablet  0  . carvedilol (COREG) 12.5 MG tablet Take 1 tablet (12.5 mg total) by mouth 2 (two) times daily with a meal.  180 tablet  0  . Cholecalciferol (VITAMIN D) 1000 UNITS capsule Take 1,000 Units by mouth daily.       . cloNIDine (CATAPRES) 0.1 MG tablet Take 0.1 mg by mouth daily at 10 pm.       . docusate sodium 100 MG CAPS Take 100 mg by mouth 2 (two) times daily.      . famotidine (PEPCID) 20 MG tablet Take 20 mg by mouth daily.      Marland Kitchen HEPARIN SODIUM, BOVINE, IJ Inject 5,000 Units as directed 3 (three) times daily.      . hydrALAZINE (  APRESOLINE) 50 MG tablet Take 50 mg by mouth every 6 (six) hours.      Marland Kitchen HYDROcodone-acetaminophen (NORCO/VICODIN) 5-325 MG per tablet Take 1-2 tablets by mouth every 6 (six) hours as needed for moderate pain.  20 tablet  0  . insulin aspart (NOVOLOG) 100 UNIT/ML injection Inject 0-9 Units into the skin 3 (three) times daily with meals. CBG < 70: implement hypoglycemia protocol CBG 70 - 120: 0 units CBG 121 - 150: 1 unit CBG 151 - 200: 2 units CBG 201 - 250: 3 units CBG 251 - 300: 5 units CBG 301 - 350: 7 units CBG 351 - 400: 9 units CBG > 400: call MD.      . insulin aspart (NOVOLOG) 100 UNIT/ML injection Inject 5 Units into the skin 2 (two) times daily. Before breakfast and before lunch      . insulin detemir (LEVEMIR) 100 UNIT/ML injection Inject 12 Units into the skin at bedtime.      . isosorbide mononitrate (ISMO,MONOKET) 20 MG tablet Take 1 tablet (20 mg total) by mouth 2 (two) times daily.  180 tablet  0  .  levothyroxine (SYNTHROID, LEVOTHROID) 88 MCG tablet Take 88 mcg by mouth daily before breakfast.      . ondansetron (ZOFRAN) 4 MG tablet Take 4 mg by mouth every 6 (six) hours as needed for nausea or vomiting.      . senna-docusate (SENOKOT-S) 8.6-50 MG per tablet Take 2 tablets by mouth 2 (two) times daily.      . sevelamer (RENAGEL) 800 MG tablet Take 1,600 mg by mouth 3 (three) times daily with meals.        Assessment: 79 yo lady c/o CP during HD.  She will be started on a heparin drip.  Per RN she is not getting any heparin during dialysis. Goal of Therapy:  Heparin level 0.3-0.7 units/ml Monitor platelets by anticoagulation protocol: Yes   Plan:  Start heparin drip at 700 units/hr after a 2000 unit heparin bolus. Check heparin level 8 hours after start Daily heparin level and CBC while on heparin.  Thanks for allowing pharmacy to be a part of this patient's care.  Talbert Cage, PharmD Clinical Pharmacist, [phone] 08/20/2014,12:18 PM

## 2014-08-20 NOTE — Progress Notes (Signed)
Hypoglycemic Event  CBG: 56  Treatment: D50 IV 25 mL  Symptoms: None  Follow-up CBG: Time:1705 CBG Result:124  Possible Reasons for Event: Unknown  Comments/MD notified:Dr. Charlesetta Shanks, Wynona Meals  Remember to initiate Hypoglycemia Order Set & complete

## 2014-08-20 NOTE — H&P (Signed)
[facility] History and Physical  Nicole [name]:[id number] DOB: [birth year] DOA: 08/20/2014  Referring physician: ER physician. PCP: Nicole Freud, DO   Chief Complaint: Shortness of breath and chest pain.  HPI: Nicole [name] is a 79 y.o. female with history of ESRD on hemodialysis, Monday Wednesday and Friday who was recently discharged from hospital after being admitted for left acetabular fracture, which was managed conservatively. Patient was brought to the ER patient was complaining of chest pressure and shortness of breath. Patient has been having off and on chest pressure last 2 days. The chest pressure is mostly retrosternal nonradiating no associated cough or phlegm. Patient also has been having shortness of breath. In the ER chest x-ray shows features concerning for pulmonary edema. Patient chest pressure improved with nitroglycerin patch. EKG were showing ST depression more pronounced than previous EKG in the inferolateral leads. Cardiac markers have been negative. On-call nephrologist has been consulted for dialysis. In addition patient was found to have blood sugars more than thousand. Patient states patient was given has been running erratic last few days with sometimes in the 500s and sometimes in the 60s. Patient otherwise denies any nausea vomiting abdominal pain diarrhea fever chills.   Review of Systems: As presented in the history of presenting illness, rest negative.  Past Medical History  Diagnosis Date  . Retinopathy   . Hypertension   . Chronic diastolic CHF (congestive heart failure)     has a normal EF per echo 03/2011 & 04/2012 with diastolic dysfunction  . CAD (coronary artery disease)     Prior PCI in 1998 x 2; s/p cath in 2001, negative Myoview in 2008 & 2011, s/p cath in June 2012 with calcified disease of the proximal LCX with normal flow reserve. She has been managed medically due to her other morbidities  . Cerebrovascular disease   .  Peripheral vascular disease     prior angiography showing RSFA stenosis, left anterior tibial and left posterior tibial stenoses  . Hypercholesterolemia   . Diabetes mellitus     insulin dependent  . Hypothyroidism   . Hiatal hernia   . Degenerative joint disease   . Osteoporosis   . Vitamin D deficiency   . Anemia of chronic disease   . ESRD (end stage renal disease)     a. CKD progressed to ESRD 10/2013. b. s/p LUE basilic transposition by vascular on 11/08/13.  . Dialysis patient     Nicole [name], and Friday  . Hip fracture, left    Past Surgical History  Procedure Laterality Date  . Abdominal aortogram  07/27/2003    by Dr. Chales Abrahams  . Bilateral lower extremity angiogram  07/27/2003    by Dr. Chales Abrahams  . Selective angiography,right superficial femoral artery, right and left iliac arteries  07/27/2003    by Dr. Chales Abrahams  . Measurement of gradient right and left iliac arteries  07/27/2003    by Dr. Chales Abrahams  . Bilateral cataract sugery  2000 and 2002    by Dr. Cecilie Kicks  . Repair left hip fracture  04/2008    by Dr. Charlann Boxer  . Bascilic vein transposition Left 11/08/2013    Procedure: BASCILIC VEIN TRANSPOSITION;  Surgeon: Pryor Ochoa, MD;  Location: [facility] OR;  Service: Vascular;  Laterality: Left;  Marland Kitchen Eye surgery      Vision loss in Right Eye   Social History:  reports that she quit smoking about 39 years ago. Her smoking use included Cigarettes. She smoked 0.00 packs  per day. She has never used smokeless tobacco. She reports that she does not drink alcohol or use illicit drugs. Where does patient live [employer]. Can patient participate in ADLs? No.  Allergies  Allergen Reactions  . Ibuprofen     REACTION: edema---thyroid problems  . Prednisone     Causes severe fluid retention  . Procrit [Epoetin Alfa]     Per Dr. Eliane Decree note: she apparently has had an "adverse reaction" to Procrit after which she gets diffuse arthralgias and myalgias.    Family History:  Family History  Problem Relation  Age of Onset  . Heart disease Mother   . Cancer Mother   . Cancer Father   . Cancer Brother       Prior to Admission medications   Medication Sig Start Date End Date Taking? Authorizing Provider  acetaminophen (TYLENOL) 650 MG CR tablet Take 650 mg by mouth every 8 (eight) hours as needed for pain.   Yes Historical Provider, MD  amLODipine (NORVASC) 10 MG tablet Take 1 tablet (10 mg total) by mouth daily. 05/24/14  Yes Lars Masson, MD  aspirin EC 81 MG tablet Take 81 mg by mouth daily.   Yes Historical Provider, MD  atorvastatin (LIPITOR) 20 MG tablet Take 1 tablet (20 mg total) by mouth daily at 6 PM. 05/24/14  Yes Lars Masson, MD  carvedilol (COREG) 12.5 MG tablet Take 1 tablet (12.5 mg total) by mouth 2 (two) times daily with a meal. 03/08/14  Yes Lars Masson, MD  Cholecalciferol (VITAMIN D) 1000 UNITS capsule Take 1,000 Units by mouth daily.    Yes Historical Provider, MD  cloNIDine (CATAPRES) 0.1 MG tablet Take 0.1 mg by mouth daily at 10 pm.  09/14/13  Yes Lars Masson, MD  docusate sodium 100 MG CAPS Take 100 mg by mouth 2 (two) times daily. 07/26/14  Yes Elease Etienne, MD  famotidine (PEPCID) 20 MG tablet Take 20 mg by mouth daily.   Yes Historical Provider, MD  HEPARIN SODIUM, BOVINE, IJ Inject 5,000 Units as directed 3 (three) times daily.   Yes Historical Provider, MD  hydrALAZINE (APRESOLINE) 50 MG tablet Take 50 mg by mouth every 6 (six) hours.   Yes Historical Provider, MD  HYDROcodone-acetaminophen (NORCO/VICODIN) 5-325 MG per tablet Take 1-2 tablets by mouth every 6 (six) hours as needed for moderate pain. 07/26/14  Yes Elease Etienne, MD  insulin aspart (NOVOLOG) 100 UNIT/ML injection Inject 0-9 Units into the skin 3 (three) times daily with meals. CBG < 70: implement hypoglycemia protocol CBG 70 - 120: 0 units CBG 121 - 150: 1 unit CBG 151 - 200: 2 units CBG 201 - 250: 3 units CBG 251 - 300: 5 units CBG 301 - 350: 7 units CBG 351 - 400: 9 units CBG  > 400: call MD. 07/26/14  Yes Elease Etienne, MD  insulin aspart (NOVOLOG) 100 UNIT/ML injection Inject 5 Units into the skin 2 (two) times daily. Before breakfast and before lunch 07/26/14  Yes Elease Etienne, MD  insulin detemir (LEVEMIR) 100 UNIT/ML injection Inject 12 Units into the skin at bedtime.   Yes Historical Provider, MD  isosorbide mononitrate (ISMO,MONOKET) 20 MG tablet Take 1 tablet (20 mg total) by mouth 2 (two) times daily. 03/08/14  Yes Lars Masson, MD  levothyroxine (SYNTHROID, LEVOTHROID) 88 MCG tablet Take 88 mcg by mouth daily before breakfast.   Yes Historical Provider, MD  ondansetron (ZOFRAN) 4 MG tablet Take 4  mg by mouth every 6 (six) hours as needed for nausea or vomiting.   Yes Historical Provider, MD  senna-docusate (SENOKOT-S) 8.6-50 MG per tablet Take 2 tablets by mouth 2 (two) times daily. 07/26/14  Yes Elease Etienne, MD  sevelamer (RENAGEL) 800 MG tablet Take 1,600 mg by mouth 3 (three) times daily with meals.   Yes Historical Provider, MD    Physical Exam: Filed Vitals:   08/20/14 0545 08/20/14 0615 08/20/14 0630 08/20/14 0645  BP: 159/42 166/48 160/46 160/43  Pulse: 73 74 74 79  Resp: Height:      Weight:      SpO2: 97% 97% 98% 97%     General:  Well developed and nourished.  Eyes: Anicteric no pallor.  ENT: No discharge from ears eyes nose mouth.  Neck: No mass felt.  Cardiovascular: S1-S2 heard.  Respiratory: No rhonchi or crepitations.  Abdomen: Soft nontender bowel sounds present. No guarding or rigidity.  Skin: Left foot has a dressing on the dorsal aspect.  Musculoskeletal: No edema. Pain on moving the left hip.  Psychiatric: Appears normal.  Neurologic: Alert awake oriented to time place and person. Moves all extremities.  Labs on Admission:  Basic Metabolic Panel:  Recent Labs Lab 08/20/14 0359  NA 124*  K 6.1*  CL 84*  CO2 21  GLUCOSE 1027*  BUN 75*  CREATININE 4.76*  CALCIUM 8.7   Liver  Function Tests: No results found for this basename: AST, ALT, ALKPHOS, BILITOT, PROT, ALBUMIN,  in the last 168 hours No results found for this basename: LIPASE, AMYLASE,  in the last 168 hours No results found for this basename: AMMONIA,  in the last 168 hours CBC:  Recent Labs Lab 08/20/14 0359  WBC 8.1  NEUTROABS 7.3  HGB 9.8*  HCT 30.7*  MCV 102.3*  PLT 197   Cardiac Enzymes: No results found for this basename: CKTOTAL, CKMB, CKMBINDEX, TROPONINI,  in the last 168 hours  BNP (last 3 results)  Recent Labs  10/29/13 1719 08/20/14 0323  PROBNP 5412.0* 9463.0*   CBG: No results found for this basename: GLUCAP,  in the last 168 hours  Radiological Exams on Admission: Dg Chest Portable 1 View  08/20/2014   CLINICAL DATA:  Chest pain, shortness of breath  EXAM: PORTABLE CHEST - 1 VIEW  COMPARISON:  Prior radiograph from 07/22/2014  FINDINGS: Cardiomegaly is stable from prior exam. Atherosclerotic calcifications noted within the aortic arch.  Hazy bibasilar opacities with prominence of the interstitial markings present, most compatible with pulmonary edema. Superimposed infection is not entirely excluded. Probable left pleural effusion present. No pneumothorax.  No acute osseus abnormality.  IMPRESSION: 1. Cardiomegaly with findings suggestive of bibasilar pulmonary edema. Superimposed infection could be considered in the correct clinical setting. 2. Probable small left pleural effusion.   Electronically Signed   By: Rise Mu M.D.   On: 08/20/2014 04:35    EKG: Independently reviewed. Normal sinus rhythm with ST depression inferolateral leads and pronounced T waves in V2 V3.  Assessment/Plan Principal Problem:   Acute pulmonary edema Active Problems:   Chronic diastolic heart failure   End stage renal disease   Chest pain   Hyperosmolar non-ketotic state in patient with type 2 diabetes mellitus   1. Acute pulmonary edema in a patient on dialysis - on-call  nephrologist was notified for dialysis. Patient's last EF was 60-65% on September 2014. Closely follow intake output and metabolic panel and respiratory status after dialysis.  2. Chest pain - improved with nitroglycerin patch. Cycle her markers. Aspirin. 3. Hyperosmolar nonketotic diabetes mellitus - patient does have mild elevated anion gap which may be related to patient's renal disease. At this time patient has been started on IV insulin infusion. Recent hemoglobin A1c was 8.6. Closely follow CBGs metabolic panel. Not sure what precipitated patient's uncontrolled diabetes. Check UA. 4. ESRD on hemodialysis on Monday Wednesday and Friday - see #1. 5. Recent left acetabular fracture. 6. Hypothyroidism - continue Synthroid. 7. Chronic anemia macrocytic - follow CBC for any further worsening otherwise further workup as outpatient.    Code Status: Full code.  Family Communication: Patient's son at the bedside.  Disposition Plan: Admit to inpatient.    Daylan Juhnke N. [facility] Pager [phone].  If 7PM-7AM, please contact night-coverage [email or web address] Password [email or web address] 08/20/2014, 7:11 AM

## 2014-08-20 NOTE — ED Notes (Signed)
While transferring pt there was a malfunction with IV pump. I did not restart insulin drip without verifying rate in pt's chart. Accepting RN made aware that insulin not currently infusing and will restart at correct rate.

## 2014-08-20 NOTE — Progress Notes (Signed)
CRITICAL VALUE ALERT  Critical value received:  glucose  Date of notification:  08/20/2014  Time of notification1245  Critical value read backyes  Nurse who received alert:  Cristy Friedlander  MD notified (1st page):  Pt on glucostabalizer  Time of first page:  1245  MD notified (2nd page):  Time of second page:  Responding MD:  MD aware  Time MD [phone]

## 2014-08-20 NOTE — Interval H&P Note (Signed)
Cath Lab Visit (complete for each Cath Lab visit)  Clinical Evaluation Leading to the Procedure:   ACS: Yes.    Non-ACS:    Anginal Classification: CCS IV  Anti-ischemic medical therapy: Maximal Therapy (2 or more classes of medications)  Non-Invasive Test Results: No non-invasive testing performed  Prior CABG: No previous CABG      History and Physical Interval Note:  08/20/2014 5:50 PM  Sharlet Salina  has presented today for surgery, with the diagnosis of cp  The various methods of treatment have been discussed with the patient and family. After consideration of risks, benefits and other options for treatment, the patient has consented to  Procedure(s): LEFT HEART CATHETERIZATION WITH CORONARY ANGIOGRAM (N/A) as a surgical intervention .  The patient's history has been reviewed, patient examined, no change in status, stable for surgery.  I have reviewed the patient's chart and labs.  Questions were answered to the patient's satisfaction.     VARANASI,JAYADEEP S.

## 2014-08-20 NOTE — ED Notes (Signed)
Pt recently had surgery to her hip.  Pt also goes to dialysis MWF.  Left arm restricted.

## 2014-08-20 NOTE — ED Notes (Signed)
While transferring pt to the floor, there was a malfunction with the IV pump. I did not restart the insulin drip in transport without verifying the rate in pt's chart. Accepting RN made aware that insulin was not currently infusing on arrival to the floor and will restart at correct rate.

## 2014-08-20 NOTE — H&P (View-Only) (Signed)
  CARDIOLOGY CONSULT NOTE  Patient ID: Nicole [name] MRN: [id number] DOB/AGE: 05/12/1935 79 y.o.  Admit date: 08/20/2014 Primary Cardiologist: Dr. Nelson Reason for Consultation: Chest pain, dyspnea  HPI: 79 yo with history of ESRD, HTN, chronic diastolic CHF, DM, and CAD presented with dyspnea and chest pressure.  She was found to have a hyperglycemic nonketotic state.  Patient had a complex left acetabular fracture that was managed medically in 8/15.  She has been in rehab.  Last night and the night before, she woke up with central chest pressure and dyspnea.  It was worse last night.  She was brought to the ER.  In the ER, glucose was > 1000 (still very high), and now she is on an insulin gtt.  CXR showed pulmonary edema.  NTG helped the chest pressure and now NTG paste is in place. Currently, she is drowsy but able to answer questions.  She is going to HD.  Pressure in chest is very mild now.  Point of care troponin negative, ECG shows significant ST depression anterolaterally (progressive from prior ECG).   Review of systems complete and found to be negative unless listed above in HPI  Past Medical History: 1. ESRD: HD since 11/14, MWF.  2. HTN 3. Chronic diastolic CHF: Echo (9/14) with EF 65-70%, mild MR.  4. CAD: PCI 1998 x 2.  LHC 6/12 with calcified proximal LCx stenosis, not flow limiting by FFR.  5. PAD: Significant disease on prior angiography, sees [facility].  6. Hyperlipidemia 7. Diabetes mellitus with episodes of hyperglycemic nonketotic state.  8. Hypothyroidism 9. Chronic anemia: Likely due to renal disease.  10. Complex left acetabular fracture in 8/15 managed medically.   Family History  Problem Relation Age of Onset  . Heart disease Mother   . Cancer Mother   . Cancer Father   . Cancer Brother     History   Social History  . Marital Status: Married    Spouse Name: James L. Sonier    Number of Children: 2  . Years of Education: N/A   Occupational History   . retired    Social History Main Topics  . Smoking status: Former Smoker    Types: Cigarettes    Quit date: 12/07/1974  . Smokeless tobacco: Never Used     Comment: pt started smoking in 1956  . Alcohol Use: No  . Drug Use: No  . Sexual Activity: No   Other Topics Concern  . Not on file   Social History Narrative   Married to james [place]   2 children---son has kidney and pancrease transplant     Prescriptions prior to admission  Medication Sig Dispense Refill  . acetaminophen (TYLENOL) 650 MG CR tablet Take 650 mg by mouth every 8 (eight) hours as needed for pain.      . amLODipine (NORVASC) 10 MG tablet Take 1 tablet (10 mg total) by mouth daily.  30 tablet  0  . aspirin EC 81 MG tablet Take 81 mg by mouth daily.      . atorvastatin (LIPITOR) 20 MG tablet Take 1 tablet (20 mg total) by mouth daily at 6 PM.  30 tablet  0  . carvedilol (COREG) 12.5 MG tablet Take 1 tablet (12.5 mg total) by mouth 2 (two) times daily with a meal.  180 tablet  0  . Cholecalciferol (VITAMIN D) 1000 UNITS capsule Take 1,000 Units by mouth daily.       . cloNIDine (CATAPRES) 0.1 MG   tablet Take 0.1 mg by mouth daily at 10 pm.       . docusate sodium 100 MG CAPS Take 100 mg by mouth 2 (two) times daily.      . famotidine (PEPCID) 20 MG tablet Take 20 mg by mouth daily.      . HEPARIN SODIUM, BOVINE, IJ Inject 5,000 Units as directed 3 (three) times daily.      . hydrALAZINE (APRESOLINE) 50 MG tablet Take 50 mg by mouth every 6 (six) hours.      . HYDROcodone-acetaminophen (NORCO/VICODIN) 5-325 MG per tablet Take 1-2 tablets by mouth every 6 (six) hours as needed for moderate pain.  20 tablet  0  . insulin aspart (NOVOLOG) 100 UNIT/ML injection Inject 0-9 Units into the skin 3 (three) times daily with meals. CBG < 70: implement hypoglycemia protocol CBG 70 - 120: 0 units CBG 121 - 150: 1 unit CBG 151 - 200: 2 units CBG 201 - 250: 3 units CBG 251 - 300: 5 units CBG 301 - 350: 7 units CBG 351 - 400:  9 units CBG > 400: call MD.      . insulin aspart (NOVOLOG) 100 UNIT/ML injection Inject 5 Units into the skin 2 (two) times daily. Before breakfast and before lunch      . insulin detemir (LEVEMIR) 100 UNIT/ML injection Inject 12 Units into the skin at bedtime.      . isosorbide mononitrate (ISMO,MONOKET) 20 MG tablet Take 1 tablet (20 mg total) by mouth 2 (two) times daily.  180 tablet  0  . levothyroxine (SYNTHROID, LEVOTHROID) 88 MCG tablet Take 88 mcg by mouth daily before breakfast.      . ondansetron (ZOFRAN) 4 MG tablet Take 4 mg by mouth every 6 (six) hours as needed for nausea or vomiting.      . senna-docusate (SENOKOT-S) 8.6-50 MG per tablet Take 2 tablets by mouth 2 (two) times daily.      . sevelamer (RENAGEL) 800 MG tablet Take 1,600 mg by mouth 3 (three) times daily with meals.       Current Scheduled Meds: . amLODipine  10 mg Oral Daily  . aspirin  325 mg Oral Daily  . atorvastatin  20 mg Oral q1800  . carvedilol  12.5 mg Oral BID WC  . cloNIDine  0.1 mg Oral Q2200  . darbepoetin (ARANESP) injection - DIALYSIS  25 mcg Intravenous Q Mon-HD  . famotidine  20 mg Oral Daily  . heparin  5,000 Units Subcutaneous 3 times per day  . hydrALAZINE  75 mg Oral 3 times per day  . isosorbide mononitrate  20 mg Oral BID  . levothyroxine  88 mcg Oral QAC breakfast  . multivitamin  1 tablet Oral QHS  . senna-docusate  2 tablet Oral BID  . sevelamer carbonate  1,600 mg Oral TID WC   Continuous Infusions: . sodium chloride 10 mL/hr at 08/20/14 0830  . dextrose 5 % and 0.45% NaCl    . insulin (NOVOLIN-R) infusion 27.1 Units/hr (08/20/14 1000)   PRN Meds:.dextrose, HYDROcodone-acetaminophen  Physical exam Blood pressure 161/50, pulse 82, temperature 97.9 F (36.6 C), temperature source Oral, resp. rate 17, height 5' 4" (1.626 m), weight 112 lb 14 oz (51.2 kg), SpO2 95.00%. General: NAD Neck: JVP 8-9 cm, no thyromegaly or thyroid nodule.  Lungs: Crackles at bases, end-expiratory  wheezes CV: Nondisplaced PMI.  Heart regular S1/S2, no S3/S4, no murmur.  No peripheral edema.   Abdomen: Soft, nontender, no hepatosplenomegaly, no   distention.  Skin: Intact without lesions or rashes.  Neurologic: Alert and oriented x 3.  Psych: Normal affect. Extremities: No clubbing or cyanosis.  HEENT: Normal.   Labs:   Lab Results  Component Value Date   WBC 8.1 08/20/2014   HGB 9.8* 08/20/2014   HCT 30.7* 08/20/2014   MCV 102.3* 08/20/2014   PLT 197 08/20/2014    Recent Labs Lab 08/20/14 0359  NA 124*  K 6.1*  CL 84*  CO2 21  BUN 75*  CREATININE 4.76*  CALCIUM 8.7  GLUCOSE 1027*  TnI 0.07   Radiology: - CXR: Bibasilar edema  EKG: NSR, 1.5 mm ST depression anterolaterally with lateral TWIs. Prior ECG with slight anterolateral ST depression.   ASSESSMENT AND PLAN: 79 yo with history of ESRD, HTN, chronic diastolic CHF, DM, and CAD presented with dyspnea and chest pressure.  She was found to have a hyperglycemic nonketotic state.   1. Hyperglycemic nonketotic state: Glucose still very high.  She is on insulin gtt.  She says her glucose has been up and down.  She refused her short-acting insulin at the nursing home last night because she did not want to drop am glucose too low.   2. Acute on chronic diastolic CHF: Patient is volume overloaded.  Last HD was Friday, so may need more fluid off with each HD and lower dry weight.  However, cannot rule out coronary event as triggering cause.  Going to HD now for fluid removal.  - Echo to reassess EF.  3. Chest pressure/abnormal ECG: Symptoms concerning for unstable angina, anterolateral ECG changes are progressive from prior ECG.  Initial troponin not elevated but will need to cycle.  She has known CAD.  Symptoms better with NTG.  However, cannot rule out demand ischemia in the setting of volume overload/CHF.  - I think that it would be reasonable to start IV heparin gtt.  - Cycle troponin - Continue ASA, statin.  - Will need to  get control of blood glucose today.  Will need to then think about ischemic workup.  With rise in troponin, would favor cath.  If troponin remains normal and symptoms resolve with HD/fluid removal, would consider functional study, inpatient or outpatient.  4. HTN: Continue meds, running high.  May need to adjust.   Nole Robey 08/20/2014 10:42 AM  Diaphoresis on HD, ECG changes became more profound.  Troponin positive.  She is on heparin gtt.  Last glucose in system is 207.  Think she will need cath, will try to arrange for later today.   Giankarlo Leamer 08/20/2014 1:25 PM  

## 2014-08-20 NOTE — Consult Note (Signed)
Indication for Consultation:  Management of ESRD/hemodialysis; anemia, hypertension/volume and secondary hyperparathyroidism  HPI: Nicole [name] is a 79 y.o. female who presented to the ED overnight with complaints of sob and chest pain. She reports she had been feeling well yesterday until sudden onset chest pain around 9pm. She receives HD MWF @ [facility]. Last HD Friday with 7L removed.  She had a L hip fracture in august, nonsurgical management, and was discharged to [facility], then to [facility] rehab last week. Her first HD back in her outpt center was Friday. She reports not eating very well at the rehab now, had been having low glucose in the AM and refusing her insulin, glucose on admission over 1000. She currently reports improvement in chest pain and sob, xray showed pulm edema. Will arrange HD today.  Past Medical History  Diagnosis Date  . Retinopathy   . Hypertension   . Chronic diastolic CHF (congestive heart failure)     has a normal EF per echo 03/2011 & 04/2012 with diastolic dysfunction  . CAD (coronary artery disease)     Prior PCI in 1998 x 2; s/p cath in 2001, negative Myoview in 2008 & 2011, s/p cath in June 2012 with calcified disease of the proximal LCX with normal flow reserve. She has been managed medically due to her other morbidities  . Cerebrovascular disease   . Peripheral vascular disease     prior angiography showing RSFA stenosis, left anterior tibial and left posterior tibial stenoses  . Hypercholesterolemia   . Diabetes mellitus     insulin dependent  . Hypothyroidism   . Hiatal hernia   . Degenerative joint disease   . Osteoporosis   . Vitamin D deficiency   . Anemia of chronic disease   . ESRD (end stage renal disease)     a. CKD progressed to ESRD 10/2013. b. s/p LUE basilic transposition by vascular on 11/08/13.  . Dialysis patient     [facility], and Friday  . Hip fracture, left    Past Surgical History  Procedure Laterality Date  . Abdominal aortogram   07/27/2003    by Dr. Chales Abrahams  . Bilateral lower extremity angiogram  07/27/2003    by Dr. Chales Abrahams  . Selective angiography,right superficial femoral artery, right and left iliac arteries  07/27/2003    by Dr. Chales Abrahams  . Measurement of gradient right and left iliac arteries  07/27/2003    by Dr. Chales Abrahams  . Bilateral cataract sugery  2000 and 2002    by Dr. Cecilie Kicks  . Repair left hip fracture  04/2008    by Dr. Charlann Boxer  . Bascilic vein transposition Left 11/08/2013    Procedure: BASCILIC VEIN TRANSPOSITION;  Surgeon: Pryor Ochoa, MD;  Location: [facility] OR;  Service: Vascular;  Laterality: Left;  Mar[facility] Kitchen Eye surgery      Vision loss in Right Eye   Family History  Problem Relation Age of Onset  . Heart disease Mother   . Cancer Mother   . Cancer Father   . Cancer Brother    Social History:  reports that she quit smoking about 39 years ago. Her smoking use included Cigarettes. She smoked 0.00 packs per day. She has never used smokeless tobacco. She reports that she does not drink alcohol or use illicit drugs. Allergies  Allergen Reactions  . Ibuprofen     REACTION: edema---thyroid problems  . Prednisone     Causes severe fluid retention  . Procrit [Epoetin Alfa]  Per Dr. Eliane Decree note: she apparently has had an "adverse reaction" to Procrit after which she gets diffuse arthralgias and myalgias.   Prior to Admission medications   Medication Sig Start Date End Date Taking? Authorizing Provider  acetaminophen (TYLENOL) 650 MG CR tablet Take 650 mg by mouth every 8 (eight) hours as needed for pain.   Yes Historical Provider, MD  amLODipine (NORVASC) 10 MG tablet Take 1 tablet (10 mg total) by mouth daily. 05/24/14  Yes Lars Masson, MD  aspirin EC 81 MG tablet Take 81 mg by mouth daily.   Yes Historical Provider, MD  atorvastatin (LIPITOR) 20 MG tablet Take 1 tablet (20 mg total) by mouth daily at 6 PM. 05/24/14  Yes Lars Masson, MD  carvedilol (COREG) 12.5 MG tablet Take 1 tablet (12.5 mg total)  by mouth 2 (two) times daily with a meal. 03/08/14  Yes Lars Masson, MD  Cholecalciferol (VITAMIN D) 1000 UNITS capsule Take 1,000 Units by mouth daily.    Yes Historical Provider, MD  cloNIDine (CATAPRES) 0.1 MG tablet Take 0.1 mg by mouth daily at 10 pm.  09/14/13  Yes Lars Masson, MD  docusate sodium 100 MG CAPS Take 100 mg by mouth 2 (two) times daily. 07/26/14  Yes Elease Etienne, MD  famotidine (PEPCID) 20 MG tablet Take 20 mg by mouth daily.   Yes Historical Provider, MD  HEPARIN SODIUM, BOVINE, IJ Inject 5,000 Units as directed 3 (three) times daily.   Yes Historical Provider, MD  hydrALAZINE (APRESOLINE) 50 MG tablet Take 50 mg by mouth every 6 (six) hours.   Yes Historical Provider, MD  HYDROcodone-acetaminophen (NORCO/VICODIN) 5-325 MG per tablet Take 1-2 tablets by mouth every 6 (six) hours as needed for moderate pain. 07/26/14  Yes Elease Etienne, MD  insulin aspart (NOVOLOG) 100 UNIT/ML injection Inject 0-9 Units into the skin 3 (three) times daily with meals. CBG < 70: implement hypoglycemia protocol CBG 70 - 120: 0 units CBG 121 - 150: 1 unit CBG 151 - 200: 2 units CBG 201 - 250: 3 units CBG 251 - 300: 5 units CBG 301 - 350: 7 units CBG 351 - 400: 9 units CBG > 400: call MD. 07/26/14  Yes Elease Etienne, MD  insulin aspart (NOVOLOG) 100 UNIT/ML injection Inject 5 Units into the skin 2 (two) times daily. Before breakfast and before lunch 07/26/14  Yes Elease Etienne, MD  insulin detemir (LEVEMIR) 100 UNIT/ML injection Inject 12 Units into the skin at bedtime.   Yes Historical Provider, MD  isosorbide mononitrate (ISMO,MONOKET) 20 MG tablet Take 1 tablet (20 mg total) by mouth 2 (two) times daily. 03/08/14  Yes Lars Masson, MD  levothyroxine (SYNTHROID, LEVOTHROID) 88 MCG tablet Take 88 mcg by mouth daily before breakfast.   Yes Historical Provider, MD  ondansetron (ZOFRAN) 4 MG tablet Take 4 mg by mouth every 6 (six) hours as needed for nausea or vomiting.   Yes  Historical Provider, MD  senna-docusate (SENOKOT-S) 8.6-50 MG per tablet Take 2 tablets by mouth 2 (two) times daily. 07/26/14  Yes Elease Etienne, MD  sevelamer (RENAGEL) 800 MG tablet Take 1,600 mg by mouth 3 (three) times daily with meals.   Yes Historical Provider, MD   Current Facility-Administered Medications  Medication Dose Route Frequency Provider Last Rate Last Dose  . 0.9 %  sodium chloride infusion   Intravenous Continuous Eduard Clos, MD 10 mL/hr at 08/20/14 0830    .  amLODipine (NORVASC) tablet 10 mg  10 mg Oral Daily Eduard Clos, MD      . aspirin tablet 325 mg  325 mg Oral Daily Eduard Clos, MD      . atorvastatin (LIPITOR) tablet 20 mg  20 mg Oral q1800 Eduard Clos, MD      . carvedilol (COREG) tablet 12.5 mg  12.5 mg Oral BID WC Eduard Clos, MD      . cloNIDine (CATAPRES) tablet 0.1 mg  0.1 mg Oral Q2200 Eduard Clos, MD      . dextrose 5 %-0.45 % sodium chloride infusion   Intravenous Continuous Eduard Clos, MD      . dextrose 50 % solution 25 mL  25 mL Intravenous PRN Eduard Clos, MD      . famotidine (PEPCID) tablet 20 mg  20 mg Oral Daily Eduard Clos, MD      . heparin injection 5,000 Units  5,000 Units Subcutaneous 3 times per day Eduard Clos, MD      . hydrALAZINE (APRESOLINE) tablet 50 mg  50 mg Oral 4 times per day Eduard Clos, MD      . HYDROcodone-acetaminophen (NORCO/VICODIN) 5-325 MG per tablet 1-2 tablet  1-2 tablet Oral Q6H PRN Eduard Clos, MD      . insulin regular (NOVOLIN R,HUMULIN R) 250 Units in sodium chloride 0.9 % 250 mL (1 Units/mL) infusion   Intravenous Continuous Eduard Clos, MD      . isosorbide mononitrate (ISMO,MONOKET) tablet 20 mg  20 mg Oral BID Eduard Clos, MD      . levothyroxine (SYNTHROID, LEVOTHROID) tablet 88 mcg  88 mcg Oral QAC breakfast Eduard Clos, MD      . nitroGLYCERIN (NITROGLYN) 2 % ointment 1 inch  1 inch Topical  Once Eduard Clos, MD      . senna-docusate (Senokot-S) tablet 2 tablet  2 tablet Oral BID Eduard Clos, MD      . sevelamer carbonate (RENVELA) tablet 1,600 mg  1,600 mg Oral TID WC Eduard Clos, MD       Labs: Basic Metabolic Panel:  Recent Labs Lab 08/20/14 0359  NA 124*  K 6.1*  CL 84*  CO2 21  GLUCOSE 1027*  BUN 75*  CREATININE 4.76*  CALCIUM 8.7   Liver Function Tests: No results found for this basename: AST, ALT, ALKPHOS, BILITOT, PROT, ALBUMIN,  in the last 168 hours No results found for this basename: LIPASE, AMYLASE,  in the last 168 hours No results found for this basename: AMMONIA,  in the last 168 hours CBC:  Recent Labs Lab 08/20/14 0359  WBC 8.1  NEUTROABS 7.3  HGB 9.8*  HCT 30.7*  MCV 102.3*  PLT 197   Cardiac Enzymes: No results found for this basename: CKTOTAL, CKMB, CKMBINDEX, TROPONINI,  in the last 168 hours CBG:  Recent Labs Lab 08/20/14 0755 08/20/14 0859  GLUCAP >600* >600*   Iron Studies: No results found for this basename: IRON, TIBC, TRANSFERRIN, FERRITIN,  in the last 72 hours Studies/Results: Dg Chest Portable 1 View  08/20/2014   CLINICAL DATA:  Chest pain, shortness of breath  EXAM: PORTABLE CHEST - 1 VIEW  COMPARISON:  Prior radiograph from 07/22/2014  FINDINGS: Cardiomegaly is stable from prior exam. Atherosclerotic calcifications noted within the aortic arch.  Hazy bibasilar opacities with prominence of the interstitial markings present, most compatible with pulmonary edema. Superimposed infection is not entirely  excluded. Probable left pleural effusion present. No pneumothorax.  No acute osseus abnormality.  IMPRESSION: 1. Cardiomegaly with findings suggestive of bibasilar pulmonary edema. Superimposed infection could be considered in the correct clinical setting. 2. Probable small left pleural effusion.   Electronically Signed   By: Rise Mu M.D.   On: 08/20/2014 04:35     Review of Systems: Gen:  Reports eating less at rehab, not on renal diet there. Denies any fever, chills, sweats, fatigue, weakness, malaise, weight loss, and sleep disorder HEENT: No visual complaints, No history of Retinopathy. Normal external appearance No Epistaxis or Sore throat. No sinusitis.   CV: Reports chest pain, relieved with nitro and orthopnea Denies palpitations, syncope, peripheral edema, and claudication. Resp: Reports dyspnea at rest, dyspnea with exercise and cough. Denies sputum, wheezing, coughing up blood, and pleurisy. GI: Denies vomiting blood, jaundice, and fecal incontinence.   Denies dysphagia or odynophagia. GU : Denies urinary burning, blood in urine, urinary frequency, urinary hesitancy, nocturnal urination, and urinary incontinence.  No renal calculi. MS: Reports L hip pain with movement Derm: Denies rash, itching, dry skin, hives, moles, warts, or unhealing ulcers.  Psych: Denies depression, anxiety, memory loss, suicidal ideation, hallucinations, paranoia, and confusion. Heme: Denies bruising, bleeding, and enlarged lymph nodes. Neuro: No headache.  No diplopia. No dysarthria.  No dysphasia.  No history of CVA.  No Seizures. No paresthesias.  No weakness. Endocrine DM- with AM lows recently now high  Physical Exam: Filed Vitals:   08/20/14 0645 08/20/14 0700 08/20/14 0715 08/20/14 0752  BP: 160/43 160/45 151/45 161/50  Pulse: 79 77 77 82  Temp:    97.9 F (36.6 C)  TempSrc:    Oral  Resp:  Height:     (1.626 m)  Weight:    51.2 kg (112 lb 14 oz)  SpO2: 97% 99% 99% 95%     General: Well developed, well nourished, in no acute distress. Head: Normocephalic, atraumatic, sclera non-icteric, mucus membranes are moist Neck: Supple. Mild JVD Lungs: Crackles bilat bases. Breathing is unlabored. Heart: RRR with S1 S2. No murmurs, rubs, or gallops appreciated. Abdomen: Soft, non-tender, non-distended with normoactive bowel sounds. No rebound/guarding. No obvious abdominal  masses. M-S:  Strength and tone appear normal for age. Lower extremities:without edema or ischemic changes, no open wounds  Neuro: Alert and oriented X 3. Moves all extremities spontaneously. Psych:  Responds to questions appropriately with a normal affect. Dialysis Access: L AVF +b/t  Dialysis Orders: MWF @ [facility] 46.5 kg  2K/2.25ca  3hr30min  2000 Heparin  350/1.5  L AVF   Aranesp 25 q week  Assessment/Plan: 1.  Pulm edema- HD today and tomorrow for volume, challenge and lower edw.  2. Hyperglycemia- glucose >1000. Insulin drip per primary 3. Chest pain- relieved with nitro- abnormal EKG cardiology consulted 4.  ESRD -  MWF [facility]. K+ 6.1 5.  Hypertension/volume  - 161/50. Norvasc, coreg, clonidine, hydralazine  6.  Anemia  - hgb 9.8. Aranesp 25, give today 7.  Metabolic bone disease -  Ca+8.7  PTH 163 8.  Nutrition - NPO. Renal vit 9. L hip fracture- follow up xray 9/30- nonsurgical management  Jetty Duhamel, NP [facility] [phone] 08/20/2014, 9:06 AM       I have seen and examined this patient and agree with plan as outlined by B. Barnetta Chapel, NP.  Mrs. Butz has evidence of pulmonary edema and given her h/o FTT she has likely lost weight and  will need to challenge her EDW.  Will cont to follow closely and make sure that she can sit in a recliner for outpt HD. Caidyn Henricksen A,MD 08/20/2014 10:57 AM

## 2014-08-20 NOTE — Clinical Social Work Psychosocial (Signed)
Clinical Social Work Department BRIEF PSYCHOSOCIAL ASSESSMENT 08/20/2014  Patient:  Nicole [name], Nicole [name]     Account Number:  [id number]     Admit date:  08/20/2014  Clinical Social Worker:  Marciano Sequin  Date/Time:  08/20/2014 04:00 PM  Referred by:  RN  Date Referred:  08/20/2014 Referred for  Return to [facility]: SNF Placement   Other Referral:   Interview type:  Patient Other interview type:    PSYCHOSOCIAL DATA Living Status:  ALONE Admitted from facility:  [facility] Level of care:  [place] Primary support name:  Y[facility]ac[facility] Nurse Primary support relationship to patient:  CHILD, ADULT Degree of support available:   Good    CURRENT CONCERNS  Other Concerns:    SOCIAL WORK ASSESSMENT / PLAN CSW met pt at bedside. CSW introduce self and purpose of visit. Pt presented with a sleepy affect and tired mood. Pt reported that she come from [facility] and would like to return. CSW explained the SNF process to the pt.  CSW provided the pt with contact information for further questions.   Assessment/plan status:  Information/Referral to [employer] Other assessment/ plan:   Information/referral to community resources:    PATIENT'S/FAMILY'S RESPONSE TO PLAN OF CARE: Agree    Maks Cavallero [place], MSW, [place]

## 2014-08-20 NOTE — ED Provider Notes (Signed)
CSN: [id number]     Arrival date & time 08/20/14  0245 History   First MD Initiated Contact with Patient 08/20/14 0302     Chief Complaint  Patient presents with  . Chest Pain  . Shortness of Breath     (Consider location/radiation/quality/duration/timing/severity/associated sxs/prior Treatment) HPI  This is a 79 year old female with history of end-stage renal disease on dialysis Monday, Wednesday, and Friday, diastolic heart failure, hypertension, diabetes, and coronary artery disease who presents with chest pain and shortness of breath.  Patient describes onset of chest pressure approximately 45 minutes prior to arrival. She states that she also had some chest pressure last night at 9:30 PM but thought it was "indigestion." She reports associated shortness of breath.  Current pain is 5/10. She did get some relief with nitroglycerin. She was given a full dose aspirin prior to arrival. At baseline she does not wear oxygen at home. She last dialyzed on Friday. Patient denies any fevers or cough. She does report that her blood sugars have been "high" over the last day and she generally has not felt well.  Past Medical History  Diagnosis Date  . Retinopathy   . Hypertension   . Chronic diastolic CHF (congestive heart failure)     has a normal EF per echo 03/2011 & 04/2012 with diastolic dysfunction  . CAD (coronary artery disease)     Prior PCI in 1998 x 2; s/p cath in 2001, negative Myoview in 2008 & 2011, s/p cath in June 2012 with calcified disease of the proximal LCX with normal flow reserve. She has been managed medically due to her other morbidities  . Cerebrovascular disease   . Peripheral vascular disease     prior angiography showing RSFA stenosis, left anterior tibial and left posterior tibial stenoses  . Hypercholesterolemia   . Diabetes mellitus     insulin dependent  . Hypothyroidism   . Hiatal hernia   . Degenerative joint disease   . Osteoporosis   . Vitamin D deficiency    . Anemia of chronic disease   . ESRD (end stage renal disease)     a. CKD progressed to ESRD 10/2013. b. s/p LUE basilic transposition by vascular on 11/08/13.  . Dialysis patient     [facility], and Friday  . Hip fracture, left    Past Surgical History  Procedure Laterality Date  . Abdominal aortogram  07/27/2003    by Dr. Chales Abrahams  . Bilateral lower extremity angiogram  07/27/2003    by Dr. Chales Abrahams  . Selective angiography,right superficial femoral artery, right and left iliac arteries  07/27/2003    by Dr. Chales Abrahams  . Measurement of gradient right and left iliac arteries  07/27/2003    by Dr. Chales Abrahams  . Bilateral cataract sugery  2000 and 2002    by Dr. Cecilie Kicks  . Repair left hip fracture  04/2008    by Dr. Charlann Boxer  . Bascilic vein transposition Left 11/08/2013    Procedure: BASCILIC VEIN TRANSPOSITION;  Surgeon: Pryor Ochoa, MD;  Location: [facility] OR;  Service: Vascular;  Laterality: Left;  Marland Kitchen Eye surgery      Vision loss in Right Eye   Family History  Problem Relation Age of Onset  . Heart disease Mother   . Cancer Mother   . Cancer Father   . Cancer Brother    History  Substance Use Topics  . Smoking status: Former Smoker    Types: Cigarettes    Quit date: 12/07/1974  .  Smokeless tobacco: Never Used     Comment: pt started smoking in 1956  . Alcohol Use: No   OB History   Grav Para Term Preterm Abortions TAB SAB Ect Mult Living                 Review of Systems  Constitutional: Positive for fatigue. Negative for fever.  Respiratory: Positive for chest tightness and shortness of breath. Negative for cough.   Cardiovascular: Negative for chest pain and leg swelling.  Gastrointestinal: Negative for nausea, vomiting and abdominal pain.  Endocrine: Positive for polyuria.  Genitourinary: Negative for dysuria.  Musculoskeletal: Negative for back pain.  Skin: Negative for rash.  Neurological: Negative for headaches.  Psychiatric/Behavioral: Negative for confusion.  All other systems  reviewed and are negative.     Allergies  Ibuprofen; Prednisone; and Procrit  Home Medications   Prior to Admission medications   Medication Sig Start Date End Date Taking? Authorizing Provider  acetaminophen (TYLENOL) 650 MG CR tablet Take 650 mg by mouth every 8 (eight) hours as needed for pain.   Yes Historical Provider, MD  amLODipine (NORVASC) 10 MG tablet Take 1 tablet (10 mg total) by mouth daily. 05/24/14  Yes Lars Masson, MD  aspirin EC 81 MG tablet Take 81 mg by mouth daily.   Yes Historical Provider, MD  atorvastatin (LIPITOR) 20 MG tablet Take 1 tablet (20 mg total) by mouth daily at 6 PM. 05/24/14  Yes Lars Masson, MD  carvedilol (COREG) 12.5 MG tablet Take 1 tablet (12.5 mg total) by mouth 2 (two) times daily with a meal. 03/08/14  Yes Lars Masson, MD  Cholecalciferol (VITAMIN D) 1000 UNITS capsule Take 1,000 Units by mouth daily.    Yes Historical Provider, MD  cloNIDine (CATAPRES) 0.1 MG tablet Take 0.1 mg by mouth daily at 10 pm.  09/14/13  Yes Lars Masson, MD  docusate sodium 100 MG CAPS Take 100 mg by mouth 2 (two) times daily. 07/26/14  Yes Elease Etienne, MD  famotidine (PEPCID) 20 MG tablet Take 20 mg by mouth daily.   Yes Historical Provider, MD  HEPARIN SODIUM, BOVINE, IJ Inject 5,000 Units as directed 3 (three) times daily.   Yes Historical Provider, MD  hydrALAZINE (APRESOLINE) 50 MG tablet Take 50 mg by mouth every 6 (six) hours.   Yes Historical Provider, MD  HYDROcodone-acetaminophen (NORCO/VICODIN) 5-325 MG per tablet Take 1-2 tablets by mouth every 6 (six) hours as needed for moderate pain. 07/26/14  Yes Elease Etienne, MD  insulin aspart (NOVOLOG) 100 UNIT/ML injection Inject 0-9 Units into the skin 3 (three) times daily with meals. CBG < 70: implement hypoglycemia protocol CBG 70 - 120: 0 units CBG 121 - 150: 1 unit CBG 151 - 200: 2 units CBG 201 - 250: 3 units CBG 251 - 300: 5 units CBG 301 - 350: 7 units CBG 351 - 400: 9  units CBG > 400: call MD. 07/26/14  Yes Elease Etienne, MD  insulin aspart (NOVOLOG) 100 UNIT/ML injection Inject 5 Units into the skin 2 (two) times daily. Before breakfast and before lunch 07/26/14  Yes Elease Etienne, MD  insulin detemir (LEVEMIR) 100 UNIT/ML injection Inject 12 Units into the skin at bedtime.   Yes Historical Provider, MD  isosorbide mononitrate (ISMO,MONOKET) 20 MG tablet Take 1 tablet (20 mg total) by mouth 2 (two) times daily. 03/08/14  Yes Lars Masson, MD  levothyroxine (SYNTHROID, LEVOTHROID) 88 MCG tablet Take 88  mcg by mouth daily before breakfast.   Yes Historical Provider, MD  ondansetron (ZOFRAN) 4 MG tablet Take 4 mg by mouth every 6 (six) hours as needed for nausea or vomiting.   Yes Historical Provider, MD  senna-docusate (SENOKOT-S) 8.6-50 MG per tablet Take 2 tablets by mouth 2 (two) times daily. 07/26/14  Yes Elease Etienne, MD  sevelamer (RENAGEL) 800 MG tablet Take 1,600 mg by mouth 3 (three) times daily with meals.   Yes Historical Provider, MD   BP 159/42  Pulse 73  Resp 19  Ht  (1.626 m)  Wt 110 lb (49.896 kg)  BMI 18.87 kg/m2  SpO2 97% Physical Exam  Nursing note and vitals reviewed. Constitutional: She is oriented to person, place, and time.  Elderly, no acute distress  HENT:  Head: Normocephalic and atraumatic.  Mouth/Throat: Oropharynx is clear and moist.  Eyes: Pupils are equal, round, and reactive to light.  Neck: Neck supple. JVD present.  Cardiovascular: Normal rate, regular rhythm and normal heart sounds.   Pulmonary/Chest: Effort normal. No respiratory distress. She has no wheezes.  Crackles noted at the bases bilaterally  Abdominal: Soft. Bowel sounds are normal. There is no tenderness. There is no rebound.  Musculoskeletal: She exhibits no edema.   fistula, left upper extremity  Neurological: She is alert and oriented to person, place, and time.  Skin: Skin is warm and dry.  Wound noted over left shin  Psychiatric:  She has a normal mood and affect.    ED Course  Procedures (including critical care time)  CRITICAL CARE Performed by: Ross Marcus, F   Total critical care time: 60 min  Critical care time was exclusive of separately billable procedures and treating other patients.  Critical care was necessary to treat or prevent imminent or life-threatening deterioration.  Critical care was time spent personally by me on the following activities: development of treatment plan with patient and/or surrogate as well as nursing, discussions with consultants, evaluation of patient's response to treatment, examination of patient, obtaining history from patient or surrogate, ordering and performing treatments and interventions, ordering and review of laboratory studies, ordering and review of radiographic studies, pulse oximetry and re-evaluation of patient's condition.  Labs Review Labs Reviewed  CBC WITH DIFFERENTIAL - Abnormal; Notable for the following:    RBC 3.00 (*)    Hemoglobin 9.8 (*)    HCT 30.7 (*)    MCV 102.3 (*)    Neutrophils Relative % 90 (*)    Lymphocytes Relative 6 (*)    Lymphs Abs 0.5 (*)    All other components within normal limits  BASIC METABOLIC PANEL - Abnormal; Notable for the following:    Sodium 124 (*)    Potassium 6.1 (*)    Chloride 84 (*)    Glucose, Bld 1027 (*)    BUN 75 (*)    Creatinine, Ser 4.76 (*)    GFR calc non Af Amer 8 (*)    GFR calc Af Amer 9 (*)    Anion gap 19 (*)    All other components within normal limits  PRO B NATRIURETIC PEPTIDE - Abnormal; Notable for the following:    Pro B Natriuretic peptide (BNP) 9463.0 (*)    All other components within normal limits  I-STAT TROPOININ, ED    Imaging Review Dg Chest Portable 1 View  08/20/2014   CLINICAL DATA:  Chest pain, shortness of breath  EXAM: PORTABLE CHEST - 1 VIEW  COMPARISON:  Prior radiograph  from 07/22/2014  FINDINGS: Cardiomegaly is stable from prior exam. Atherosclerotic  calcifications noted within the aortic arch.  Hazy bibasilar opacities with prominence of the interstitial markings present, most compatible with pulmonary edema. Superimposed infection is not entirely excluded. Probable left pleural effusion present. No pneumothorax.  No acute osseus abnormality.  IMPRESSION: 1. Cardiomegaly with findings suggestive of bibasilar pulmonary edema. Superimposed infection could be considered in the correct clinical setting. 2. Probable small left pleural effusion.   Electronically Signed   By: Rise Mu M.D.   On: 08/20/2014 04:35     EKG Interpretation   Date/Time:  Monday August 20 2014 02:54:40 EDT Ventricular Rate:  78 PR Interval:  203 QRS Duration: 103 QT Interval:  441 QTC Calculation: 502 R Axis:   -36 Text Interpretation:  Sinus rhythm LVH with secondary repolarization  abnormality ST depression more prominent inferior and lateral leads  Prolonged QT interval Baseline wander Reconfirmed by Letitia Sabala  MD, Feleshia Zundel  ([id number]) on 08/20/2014 6:08:44 AM      MDM   Final diagnoses:  Acute decompensated heart failure  Chest pain, unspecified chest pain type  Diabetic ketoacidosis without coma associated with other specified diabetes mellitus  Hyperkalemia    Patient presents with chest pain and shortness of breath. She is requiring 2 L of nasal cannula to maintain oxygen saturations. She does not appear overtly volume overloaded but does have crackles on pulmonary exam and is due for dialysis later today. EKG shows ST depressions inferior and laterally which are more prominent than when compared to prior EKG. Patient has already received a full dose aspirin. Nitroglycerin paste was applied and the patient had resolution of her chest pain. Chest x-ray shows pulmonary edema with a left pleural effusion. Initial troponin is negative.  Other lab work is notable for sodium of 124, potassium 6.1, glucose of 1027, anion gap of 19, and a BNP of 9400.   Hyponatremia is likely secondary to hyperglycemia. Patient was given Kayexalate for potassium of 6.1. She will be placed on glucose stabilizer given her hyperglycemia. Overall clinical picture is concerning for acute decompensated heart failure and volume overload. Nephrology consulted for dialysis. Cardiology was also consulted given patient's abnormal EKG and presentation of chest pain. Discussed with cardiology initiation of IV heparin or IV nitroglycerin; at this time will defer initiation of these given that the patient is currently chest pain-free and will need volume management with dialysis later today. Patient will be admitted to the step down unit. She is a full code.    Shon Baton, MD 08/20/14 [phone]

## 2014-08-20 NOTE — Progress Notes (Signed)
CRITICAL VALUE ALERT  Critical value received:  potassium  Date of notification:  08/20/2014  Time of notification:  1350  Critical value read back:yes  Nurse who received alert:  Cristy Friedlander  MD notified (1st page):  Dr. Shirlee Latch  Time of first page:  1355  MD notified (2nd page):  Time of second page:  Responding MD: Shirlee Latch  Time MD responded:  1355

## 2014-08-20 NOTE — Progress Notes (Signed)
[place] TEAM 1 - Stepdown/ICU TEAM Progress Note  Nicole [name] ZOX:[id number] DOB: 24-Feb-1935 DOA: 08/20/2014 PCP: Loreen Freud, DO  Admit HPI / Brief Narrative: 79 yo F with history of ESRD on hemodialysis Monday Wednesday and Friday who was recently discharged from hospital after being admitted for left acetabular fracture, which was managed conservatively. Patient was brought to the ER patient complaining of chest pressure and shortness of breath off and on for 2 days. In the ER a chest x-ray noted pulmonary edema. EKG noted ST depression more pronounced than previous EKGs in the inferolateral leads. In addition the patient was found to have blood sugars more than a thousand.  HPI/Subjective: Pt seen for f/u visit.  Assessment/Plan:  Acute pulmonary edema - Acute on chronic diastolic CHF  Chest pain  PCI 8119 x 2. LHC 6/12 with calcified proximal LCx stenosis, not flow limiting by FFR  Hyperosmolar nonketotic state in uncontrolled diabetes mellitus w/ renal complications   ESRD on hemodialysis on Monday Wednesday and Friday   Recent left acetabular fracture  Hypothyroidism  Chronic anemia macrocytic   L hip fracture 8/15  PAD  Hyperlipidemia  Code Status: FULL Family Communication: no family present at time of exam Disposition Plan: SDU  Consultants: Nephrology  Cardiology  Procedures: TTE - pending   Antibiotics: none  DVT prophylaxis: IV heparin   Objective: Blood pressure 100/45, pulse 67, temperature 98 F (36.7 C), temperature source Oral, resp. rate 20, height  (1.626 m), weight 51.2 kg (112 lb 14 oz), SpO2 98.00%. No intake or output data in the 24 hours ending 08/20/14 1139  Exam: F/U exam completed   Data Reviewed: Basic Metabolic Panel:  Recent Labs Lab 08/20/14 0359  NA 124*  K 6.1*  CL 84*  CO2 21  GLUCOSE 1027*  BUN 75*  CREATININE 4.76*  CALCIUM 8.7   Liver Function Tests: No results found for this basename:  AST, ALT, ALKPHOS, BILITOT, PROT, ALBUMIN,  in the last 168 hours No results found for this basename: LIPASE, AMYLASE,  in the last 168 hours No results found for this basename: AMMONIA,  in the last 168 hours  Coags: No results found for this basename: PT, INR,  in the last 168 hours No results found for this basename: PTT,  in the last 168 hours  CBC:  Recent Labs Lab 08/20/14 0359  WBC 8.1  NEUTROABS 7.3  HGB 9.8*  HCT 30.7*  MCV 102.3*  PLT 197   Cardiac Enzymes: No results found for this basename: CKTOTAL, CKMB, CKMBINDEX, TROPONINI,  in the last 168 hours  BNP (last 3 results)  Recent Labs  10/29/13 1719 08/20/14 0323  PROBNP 5412.0* 9463.0*   CBG:  Recent Labs Lab 08/20/14 0755 08/20/14 0859 08/20/14 1002 08/20/14 1111  GLUCAP >600* >600* >600* 336*    Recent Results (from the past 240 hour(s))  MRSA PCR SCREENING     Status: None   Collection Time    08/20/14  8:00 AM      Result Value Ref Range Status   MRSA by PCR NEGATIVE  NEGATIVE Final   Comment:            The GeneXpert MRSA Assay (FDA     approved for NASAL specimens     only), is one component of a     comprehensive MRSA colonization     surveillance program. It is not     intended to diagnose MRSA     infection nor  to guide or     monitor treatment for     MRSA infections.     Studies:  Recent x-ray studies have been reviewed in detail by the Attending Physician  Scheduled Meds:  Scheduled Meds: . amLODipine  10 mg Oral Daily  . aspirin  325 mg Oral Daily  . atorvastatin  20 mg Oral q1800  . carvedilol  12.5 mg Oral BID WC  . cloNIDine  0.1 mg Oral Q2200  . darbepoetin (ARANESP) injection - DIALYSIS  25 mcg Intravenous Q Mon-HD  . famotidine  20 mg Oral Daily  . heparin  2,000 Units Dialysis Once in dialysis  . [START ON 08/21/2014] heparin  2,000 Units Dialysis Once in dialysis  . heparin  5,000 Units Subcutaneous 3 times per day  . hydrALAZINE  75 mg Oral 3 times per day    . isosorbide mononitrate  20 mg Oral BID  . levothyroxine  88 mcg Oral QAC breakfast  . multivitamin  1 tablet Oral QHS  . ondansetron (ZOFRAN) IV  4 mg Intravenous Once  . senna-docusate  2 tablet Oral BID  . sevelamer carbonate  1,600 mg Oral TID WC    Time spent on care of this patient: 25+ mins   MCCLUNG,JEFFREY T , MD   [facility] Office  [phone] Pager - Text Page per Loretha Stapler as per below:  On-Call/Text Page:      [email or web address]      password [email or web address]  If 7PM-7AM, please contact night-coverage [email or web address] Password [email or web address] 08/20/2014, 11:39 AM   LOS: 0 days

## 2014-08-20 NOTE — CV Procedure (Signed)
    PROCEDURE:  Left heart catheterization with selective coronary angiography, PCI Left circumflex.  INDICATIONS:   NSTEMI  The risks, benefits, and details of the procedure were explained to the patient.  The patient verbalized understanding and wanted to proceed.  Informed written consent was obtained.  PROCEDURE TECHNIQUE:  After Xylocaine anesthesia a 75F sheath was placed in the right femoral artery with a single anterior needle wall stick.   Left coronary angiography was done using a Judkins L4 guide catheter.  Right coronary angiography was done using a Judkins R4 guide catheter.  Left ventriculography was done using a pigtail catheter.    CONTRAST:  Total of 105 cc.  COMPLICATIONS:  None.    HEMODYNAMICS:  Aortic pressure was 153/38; LV pressure was 160/18; LVEDP 24.  There was no gradient between the left ventricle and aorta.    ANGIOGRAPHIC DATA:   The left main coronary artery has mild ostial disease. There is a mild pressure drop when the guide is deep seated. Angiographically, the stenosis does not appear to be even close to significant.  The left anterior descending artery is a large vessel which reaches the apex. There is mild disease in the mid LAD. There is a moderate focal area of disease at the origin of a first diagonal. There is moderate ostial disease of the first diagonal. The mid to distal LAD appears patent.  The left circumflex artery is a large, dominant vessel. Proximally, there is a calcific 90% lesion. The stent in the OM1 appears patent. The remainder of the circumflex has mild disease. There several obtuse marginals which are patent.  The right coronary artery is a nondominant vessel with ostial disease and pressure dampening with catheter engagement.  LEFT VENTRICULOGRAM:  Left ventricular angiogram was not done.  LVEDP was 24 mmHg.  PCI NARRATIVE: A CLS 3.0 guide catheter was used to engage the left main. A pro-water wire was placed across the area disease  in the circumflex. A 2.0 x 10 angiosculpt balloon was advanced but would not cross the area disease. A 2.0 x 15 noncompliant balloon was advanced and inflated. We attempted to use the scoring balloon again but this would not cross. A BMW wire was then placed. A 2.5 x 15 balloon was used to predilate. A 2.25 x 16 promus drug-eluting stent was deployed at high pressure. The stent was post dilated with a 2.5 x 15 noncompliant balloon. There is a significantly improved angiographic result. TIMI-3 flow was maintained throughout. The patient did have typical angina with balloon inflations.  It was difficult to advance the physis through the ostial circumflex. There is significant calcific disease as well.  IMPRESSIONS:  1. Mild ostial left main coronary artery disease. 2. Mild to moderate diffuse disease in the left anterior descending artery and its branches. 3. 90% proximal left circumflex artery stenosis, in this left dominant circumflex. This was successfully treated with a 2.25 x 16 promus drug-eluting stent, postdilated to greater than 2.6 mm in diameter. 4. Nondominant right coronary artery. 5. LVEDP 24 mmHg.    RECOMMENDATION:  Continue dual antiplatelet therapy for her drug-eluting stent. She'll need aggressive medical therapy. Hopefully, she will tolerate dialysis better and without angina.  Cardiology Followup with Dr. Delton See.    She will continue to need internal medicine and nephrology followup. I suspect that the large dominant left circumflex was her culprit for her symptoms during dialysis.

## 2014-08-20 NOTE — ED Notes (Signed)
Attempted report 

## 2014-08-20 NOTE — CV Procedure (Signed)
Pt blood sugar dropped to 56, MD notified and orders to stop insulin drip was obtained. 1/2 amp d50 given cbg later was 124. Will continue to monitor.

## 2014-08-20 NOTE — Consult Note (Addendum)
CARDIOLOGY CONSULT NOTE  Patient ID: Nicole [name] MRN: [id number] DOB/AGE: 12-09-1934 79 y.o.  Admit date: 08/20/2014 Primary Cardiologist: Dr. Delton See Reason for Consultation: Chest pain, dyspnea  HPI: 79 yo with history of ESRD, HTN, chronic diastolic CHF, DM, and CAD presented with dyspnea and chest pressure.  She was found to have a hyperglycemic nonketotic state.  Patient had a complex left acetabular fracture that was managed medically in 8/15.  She has been in rehab.  Last night and the night before, she woke up with central chest pressure and dyspnea.  It was worse last night.  She was brought to the ER.  In the ER, glucose was > 1000 (still very high), and now she is on an insulin gtt.  CXR showed pulmonary edema.  NTG helped the chest pressure and now NTG paste is in place. Currently, she is drowsy but able to answer questions.  She is going to HD.  Pressure in chest is very mild now.  Point of care troponin negative, ECG shows significant ST depression anterolaterally (progressive from prior ECG).   Review of systems complete and found to be negative unless listed above in HPI  Past Medical History: 1. ESRD: HD since 11/14, MWF.  2. HTN 3. Chronic diastolic CHF: Echo (9/14) with EF 65-70%, mild MR.  4. CAD: PCI 1998 x 2.  LHC 6/12 with calcified proximal LCx stenosis, not flow limiting by FFR.  5. PAD: Significant disease on prior angiography, sees [facility].  6. Hyperlipidemia 7. Diabetes mellitus with episodes of hyperglycemic nonketotic state.  8. Hypothyroidism 9. Chronic anemia: Likely due to renal disease.  10. Complex left acetabular fracture in 8/15 managed medically.   Family History  Problem Relation Age of Onset  . Heart disease Mother   . Cancer Mother   . Cancer Father   . Cancer Brother     History   Social History  . Marital Status: Married    Spouse Name: Llana Aliment. Garofano    Number of Children: 2  . Years of Education: N/A   Occupational History   . retired    Social History Main Topics  . Smoking status: Former Smoker    Types: Cigarettes    Quit date: 12/07/1974  . Smokeless tobacco: Never Used     Comment: pt started smoking in 1956  . Alcohol Use: No  . Drug Use: No  . Sexual Activity: No   Other Topics Concern  . Not on file   Social History Narrative   Married to [employer]   2 children---son has kidney and pancrease transplant     Prescriptions prior to admission  Medication Sig Dispense Refill  . acetaminophen (TYLENOL) 650 MG CR tablet Take 650 mg by mouth every 8 (eight) hours as needed for pain.      Marland Kitchen amLODipine (NORVASC) 10 MG tablet Take 1 tablet (10 mg total) by mouth daily.  30 tablet  0  . aspirin EC 81 MG tablet Take 81 mg by mouth daily.      Marland Kitchen atorvastatin (LIPITOR) 20 MG tablet Take 1 tablet (20 mg total) by mouth daily at 6 PM.  30 tablet  0  . carvedilol (COREG) 12.5 MG tablet Take 1 tablet (12.5 mg total) by mouth 2 (two) times daily with a meal.  180 tablet  0  . Cholecalciferol (VITAMIN D) 1000 UNITS capsule Take 1,000 Units by mouth daily.       . cloNIDine (CATAPRES) 0.1 MG  tablet Take 0.1 mg by mouth daily at 10 pm.       . docusate sodium 100 MG CAPS Take 100 mg by mouth 2 (two) times daily.      . famotidine (PEPCID) 20 MG tablet Take 20 mg by mouth daily.      Marland Kitchen HEPARIN SODIUM, BOVINE, IJ Inject 5,000 Units as directed 3 (three) times daily.      . hydrALAZINE (APRESOLINE) 50 MG tablet Take 50 mg by mouth every 6 (six) hours.      Marland Kitchen HYDROcodone-acetaminophen (NORCO/VICODIN) 5-325 MG per tablet Take 1-2 tablets by mouth every 6 (six) hours as needed for moderate pain.  20 tablet  0  . insulin aspart (NOVOLOG) 100 UNIT/ML injection Inject 0-9 Units into the skin 3 (three) times daily with meals. CBG < 70: implement hypoglycemia protocol CBG 70 - 120: 0 units CBG 121 - 150: 1 unit CBG 151 - 200: 2 units CBG 201 - 250: 3 units CBG 251 - 300: 5 units CBG 301 - 350: 7 units CBG 351 - 400:  9 units CBG > 400: call MD.      . insulin aspart (NOVOLOG) 100 UNIT/ML injection Inject 5 Units into the skin 2 (two) times daily. Before breakfast and before lunch      . insulin detemir (LEVEMIR) 100 UNIT/ML injection Inject 12 Units into the skin at bedtime.      . isosorbide mononitrate (ISMO,MONOKET) 20 MG tablet Take 1 tablet (20 mg total) by mouth 2 (two) times daily.  180 tablet  0  . levothyroxine (SYNTHROID, LEVOTHROID) 88 MCG tablet Take 88 mcg by mouth daily before breakfast.      . ondansetron (ZOFRAN) 4 MG tablet Take 4 mg by mouth every 6 (six) hours as needed for nausea or vomiting.      . senna-docusate (SENOKOT-S) 8.6-50 MG per tablet Take 2 tablets by mouth 2 (two) times daily.      . sevelamer (RENAGEL) 800 MG tablet Take 1,600 mg by mouth 3 (three) times daily with meals.       Current Scheduled Meds: . amLODipine  10 mg Oral Daily  . aspirin  325 mg Oral Daily  . atorvastatin  20 mg Oral q1800  . carvedilol  12.5 mg Oral BID WC  . cloNIDine  0.1 mg Oral Q2200  . darbepoetin (ARANESP) injection - DIALYSIS  25 mcg Intravenous Q Mon-HD  . famotidine  20 mg Oral Daily  . heparin  5,000 Units Subcutaneous 3 times per day  . hydrALAZINE  75 mg Oral 3 times per day  . isosorbide mononitrate  20 mg Oral BID  . levothyroxine  88 mcg Oral QAC breakfast  . multivitamin  1 tablet Oral QHS  . senna-docusate  2 tablet Oral BID  . sevelamer carbonate  1,600 mg Oral TID WC   Continuous Infusions: . sodium chloride 10 mL/hr at 08/20/14 0830  . dextrose 5 % and 0.45% NaCl    . insulin (NOVOLIN-R) infusion 27.1 Units/hr (08/20/14 1000)   PRN Meds:.dextrose, HYDROcodone-acetaminophen  Physical exam Blood pressure 161/50, pulse 82, temperature 97.9 F (36.6 C), temperature source Oral, resp. rate 17, height  (1.626 m), weight 112 lb 14 oz (51.2 kg), SpO2 95.00%. General: NAD Neck: JVP 8-9 cm, no thyromegaly or thyroid nodule.  Lungs: Crackles at bases, end-expiratory  wheezes CV: Nondisplaced PMI.  Heart regular S1/S2, no S3/S4, no murmur.  No peripheral edema.   Abdomen: Soft, nontender, no hepatosplenomegaly, no  distention.  Skin: Intact without lesions or rashes.  Neurologic: Alert and oriented x 3.  Psych: Normal affect. Extremities: No clubbing or cyanosis.  HEENT: Normal.   Labs:   Lab Results  Component Value Date   WBC 8.1 08/20/2014   HGB 9.8* 08/20/2014   HCT 30.7* 08/20/2014   MCV 102.3* 08/20/2014   PLT 197 08/20/2014    Recent Labs Lab 08/20/14 0359  NA 124*  K 6.1*  CL 84*  CO2 21  BUN 75*  CREATININE 4.76*  CALCIUM 8.7  GLUCOSE 1027*  TnI 0.07   Radiology: - CXR: Bibasilar edema  EKG: NSR, 1.5 mm ST depression anterolaterally with lateral TWIs. Prior ECG with slight anterolateral ST depression.   ASSESSMENT AND PLAN: 79 yo with history of ESRD, HTN, chronic diastolic CHF, DM, and CAD presented with dyspnea and chest pressure.  She was found to have a hyperglycemic nonketotic state.   1. Hyperglycemic nonketotic state: Glucose still very high.  She is on insulin gtt.  She says her glucose has been up and down.  She refused her short-acting insulin at the nursing home last night because she did not want to drop am glucose too low.   2. Acute on chronic diastolic CHF: Patient is volume overloaded.  Last HD was Friday, so may need more fluid off with each HD and lower dry weight.  However, cannot rule out coronary event as triggering cause.  Going to HD now for fluid removal.  - Echo to reassess EF.  3. Chest pressure/abnormal ECG: Symptoms concerning for unstable angina, anterolateral ECG changes are progressive from prior ECG.  Initial troponin not elevated but will need to cycle.  She has known CAD.  Symptoms better with NTG.  However, cannot rule out demand ischemia in the setting of volume overload/CHF.  - I think that it would be reasonable to start IV heparin gtt.  - Cycle troponin - Continue ASA, statin.  - Will need to  get control of blood glucose today.  Will need to then think about ischemic workup.  With rise in troponin, would favor cath.  If troponin remains normal and symptoms resolve with HD/fluid removal, would consider functional study, inpatient or outpatient.  4. HTN: Continue meds, running high.  May need to adjust.   Marca Ancona 08/20/2014 10:42 AM  Diaphoresis on HD, ECG changes became more profound.  Troponin positive.  She is on heparin gtt.  Last glucose in system is 207.  Think she will need cath, will try to arrange for later today.   Marca Ancona 08/20/2014 1:25 PM

## 2014-08-20 NOTE — Procedures (Signed)
Patient was seen on dialysis and the procedure was supervised. BFR 400 Via LAVF BP is 152/57.  Patient appears to be tolerating treatment well.

## 2014-08-20 NOTE — ED Notes (Signed)
Pt from [facility] rehab facility with chest pain and shortness of breath.  Chest pain started this evening with no radiation.  Described as sharp in nature.  Pain initially 10/10. Pt took 1 nitro with chest pain relief.  Pt associating shortness of breath with chest pain.  EMS reports crackles to lungs.  CBG checked and read high.  Pt took 324 ASA PTA.  20 L hand established en route.  ST depression noted on EKG with no reciprocal changes.

## 2014-08-20 NOTE — Progress Notes (Signed)
UR Completed Zhanna Melin Graves-Bigelow, RN,BSN [phone]  

## 2014-08-20 NOTE — Progress Notes (Signed)
Pt started C/O of being nauseated and chest tight during hemodialysis Tx. Jetty Duhamel, PA for renal informed. Pt also dropped HR to 50 and had some ST seg depression associated with decreased HR that lasted appox then HR returned to 70's and ST normalized. Dr. Shirlee Latch paged and informed of this as well. States he will order a heparin gtt to be started on her when she returns to her room post hemodialysis. Zofran given for nausea.

## 2014-08-21 ENCOUNTER — Encounter: Payer: Medicare Other | Admitting: Family Medicine

## 2014-08-21 DIAGNOSIS — I739 Peripheral vascular disease, unspecified: Secondary | ICD-10-CM

## 2014-08-21 DIAGNOSIS — I214 Non-ST elevation (NSTEMI) myocardial infarction: Secondary | ICD-10-CM | POA: Diagnosis not present

## 2014-08-21 LAB — GLUCOSE, CAPILLARY
GLUCOSE-CAPILLARY: 180 mg/dL — AB (ref 70–99)
GLUCOSE-CAPILLARY: 161 mg/dL — AB (ref 70–99)
Glucose-Capillary: 199 mg/dL — ABNORMAL HIGH (ref 70–99)
Glucose-Capillary: >600 mg/dL (ref 70–99)
Glucose-Capillary: >600 mg/dL (ref 70–99)
Glucose-Capillary: 293 mg/dL — ABNORMAL HIGH (ref 70–99)
Glucose-Capillary: 155 mg/dL — ABNORMAL HIGH (ref 70–99)

## 2014-08-21 LAB — POCT ACTIVATED CLOTTING TIME
ACTIVATED CLOTTING TIME: 152 s
ACTIVATED CLOTTING TIME: 247 s
Activated Clotting Time: 101 seconds
Activated Clotting Time: 208 seconds
Activated Clotting Time: 259 seconds
Activated Clotting Time: 326 seconds
Activated Clotting Time: 129 seconds

## 2014-08-21 LAB — BASIC METABOLIC PANEL
Anion gap: 13 (ref 5–15)
BUN: 48 mg/dL — AB (ref 6–23)
CO2: 25 mEq/L (ref 19–32)
CREATININE: 3.69 mg/dL — AB (ref 0.50–1.10)
Calcium: 8.1 mg/dL — ABNORMAL LOW (ref 8.4–10.5)
Chloride: 97 mEq/L (ref 96–112)
GFR calc Af Amer: 13 mL/min — ABNORMAL LOW (ref >90)
GFR, EST NON AFRICAN AMERICAN: 11 mL/min — AB (ref >90)
Glucose, Bld: 130 mg/dL — ABNORMAL HIGH (ref 70–99)
Potassium: 5.2 mEq/L (ref 3.7–5.3)
Sodium: 135 mEq/L — ABNORMAL LOW (ref 137–147)

## 2014-08-21 LAB — CBC
HEMATOCRIT: 23.2 % — AB (ref 36.0–46.0)
Hemoglobin: 7.9 g/dL — ABNORMAL LOW (ref 12.0–15.0)
MCH: 32.6 pg (ref 26.0–34.0)
MCHC: 34.1 g/dL (ref 30.0–36.0)
MCV: 95.9 fL (ref 78.0–100.0)
Platelets: 179 10*3/uL (ref 150–400)
RBC: 2.42 MIL/uL — AB (ref 3.87–5.11)
RDW: 15.8 % — ABNORMAL HIGH (ref 11.5–15.5)
WBC: 8.8 10*3/uL (ref 4.0–10.5)

## 2014-08-21 MED ORDER — INSULIN ASPART 100 UNIT/ML ~~LOC~~ SOLN: 5.0000 [IU] | Freq: Two times a day (BID) | SUBCUTANEOUS | Status: DC

## 2014-08-21 MED ORDER — ATROPINE SULFATE 0.1 MG/ML IJ SOLN
INTRAMUSCULAR | Status: AC
  Filled 2014-08-21: qty 10

## 2014-08-21 MED ORDER — BOOST / RESOURCE BREEZE PO LIQD
1.0000 | ORAL | Status: DC
  Administered 2014-08-21 – 2014-08-25 (×4): 1 via ORAL

## 2014-08-21 MED ORDER — INSULIN ASPART 100 UNIT/ML ~~LOC~~ SOLN
0.0000 [IU] | Freq: Three times a day (TID) | SUBCUTANEOUS | Status: DC
  Administered 2014-08-21: 2 [IU] via SUBCUTANEOUS
  Administered 2014-08-22: 3 [IU] via SUBCUTANEOUS
  Administered 2014-08-22: 2 [IU] via SUBCUTANEOUS
  Administered 2014-08-23: 7 [IU] via SUBCUTANEOUS
  Administered 2014-08-24 (×3): 3 [IU] via SUBCUTANEOUS
  Administered 2014-08-25 (×2): 2 [IU] via SUBCUTANEOUS

## 2014-08-21 MED ORDER — INSULIN DETEMIR 100 UNIT/ML ~~LOC~~ SOLN
12.0000 [IU] | Freq: Every day | SUBCUTANEOUS | Status: DC
  Administered 2014-08-21: 12 [IU] via SUBCUTANEOUS
  Filled 2014-08-21 (×2): qty 0.12

## 2014-08-21 MED ORDER — DOCUSATE SODIUM 100 MG PO CAPS
100.0000 mg | ORAL_CAPSULE | Freq: Two times a day (BID) | ORAL | Status: DC
  Administered 2014-08-21 – 2014-08-25 (×5): 100 mg via ORAL
  Filled 2014-08-21 (×11): qty 1

## 2014-08-21 MED ORDER — HEPARIN SODIUM (PORCINE) 5000 UNIT/ML IJ SOLN
5000.0000 [IU] | Freq: Three times a day (TID) | INTRAMUSCULAR | Status: DC
  Administered 2014-08-21 – 2014-08-25 (×11): 5000 [IU] via SUBCUTANEOUS
  Filled 2014-08-21 (×15): qty 1

## 2014-08-21 NOTE — Progress Notes (Signed)
Report was called to RN on unit 3 East. Pt will transfer to room 3 East Room 10.  Patient called family to inform of new room number.

## 2014-08-21 NOTE — Progress Notes (Signed)
[facility] TEAM 1 - Stepdown/ICU TEAM Progress Note  Nicole [name] ZOX:[id number] DOB: 1935-09-04 DOA: 08/20/2014 PCP: Loreen Freud, DO  Admit HPI / Brief Narrative: 79 yo F with history of ESRD on hemodialysis Monday Wednesday and Friday who was recently discharged from hospital after being admitted for left acetabular fracture, which was managed conservatively. Patient was brought to the ER complaining of chest pressure and shortness of breath off and on for 2 days. In the ER a chest x-ray noted pulmonary edema. EKG noted ST depression more pronounced than previous EKGs in the inferolateral leads. In addition the patient was found to have blood sugars more than a thousand.  HPI/Subjective: Pt is c/o pain in her L hip, and down her leg at the site of a lateral tibial area wounds. She denies cp, sob, n/v, or abdom pain.    Assessment/Plan:  Acute pulmonary edema - Acute on chronic diastolic CHF Resolved w/ HD - ongoing volume management per Nephrology   Chest pain  PCI 1998 x 2 - LHC 6/12 with calcified proximal LCx stenosis, not flow limiting by FFR - pt s/p cath 9/14 w/ DES to 90% proximal L circ stenosis, also noting mod to mod diffuse disease of L main and LAD - Cardiology following - on DAPT - ongoing care per Cardiology   Hyperosmolar nonketotic state in uncontrolled diabetes mellitus w/ renal complications  Resolved - CBG not yet at goal - adjust tx plan and follow  ESRD on hemodialysis on Monday Wednesday and Friday  As per Nephrology   Recent left acetabular fracture 8/15 Evaluated by Dr. Carola Frost during prior admit - bed to chair for another 4-5 weeks per original plan (slide or lift transfers only during the beginning phases, then progress to stand pivot transfers on her right leg) - thereafter may begin some graduated weight bearing   L LE cutaneous wound WOC RN consult for evaluation   Hypothyroidism Cont home synthroid dose   Chronic macrocytic anemia Hgb is  trending down - no obvious source of blood loss - recheck in AM - if <8.0 will need transfusion in setting of known CAD   PAD  Hyperlipidemia Cont lipitor  Code Status: FULL Family Communication: no family present at time of exam Disposition Plan: stable for transfer to tele bed - PT/OT eval to begin transfers to chair - wound eval   Consultants: Nephrology  Cardiology  Procedures: TTE - pending   9/14 - cardiac cath - as discussed above   Antibiotics: none  DVT prophylaxis: SQ heparin   Objective: Blood pressure 169/32, pulse 67, temperature 97.9 F (36.6 C), temperature source Oral, resp. rate 18, height  (1.626 m), weight 46.5 kg (102 lb 8.2 oz), SpO2 95.00%.  Intake/Output Summary (Last 24 hours) at 08/21/14 1317 Last data filed at 08/21/14 1034  Gross per 24 hour  Intake    257 ml  Output   3250 ml  Net  -2993 ml   Exam: General: No acute respiratory distress - alert and conversant  Lungs: Clear to auscultation bilaterally without wheezes or crackles Cardiovascular: Regular rate and rhythm without murmur gallop or rub normal S1 and S2 Abdomen: Nontender, nondistended, soft, bowel sounds positive, no rebound, no ascites, no appreciable mass Extremities: No significant cyanosis, clubbing, or edema bilateral lower extremities Cutaneous:  Linear wound lateral aspect L LE - no surrounding erythema or d/c but central eschar present, worrisome for deeper unstageable wound   Data Reviewed: Basic Metabolic Panel:  Recent Labs  Lab 08/20/14 0359 08/20/14 0821 08/20/14 1300 08/20/14 1618 08/21/14 0248  NA 124* 130* 140 139 135*  K 6.1* 4.1 2.8* 3.6*  3.7 5.2  CL 84* 88* 98 98 97  CO2 GLUCOSE 1027* 630* 212* 71 130*  BUN 75* 78* 31* 46* 48*  CREATININE 4.76* 5.04* 2.22* 3.44* 3.69*  CALCIUM 8.7 9.0 8.8 9.2 8.1*  PHOS  --   --  1.3*  --   --    Liver Function Tests:  Recent Labs Lab 08/20/14 1300  ALBUMIN 2.8*   CBC:  Recent  Labs Lab 08/20/14 0359 08/20/14 0821 08/20/14 1618 08/21/14 0248  WBC 8.1 9.2 10.6* 8.8  NEUTROABS 7.3 7.9*  --   --   HGB 9.8* 8.8* 9.1* 7.9*  HCT 30.7* 25.6* 26.0* 23.2*  MCV 102.3* 96.2 94.2 95.9  PLT 197 208 200 179   Cardiac Enzymes:  Recent Labs Lab 08/20/14 0821 08/20/14 1618  TROPONINI 0.93* 1.22*    BNP (last 3 results)  Recent Labs  10/29/13 1719 08/20/14 0323  PROBNP 5412.0* 9463.0*   CBG:  Recent Labs Lab 08/20/14 1709 08/20/14 2054 08/20/14 2129 08/21/14 08/21/14 0456  GLUCAP 124* 66* 108* 199* 180*    Recent Results (from the past 240 hour(s))  MRSA PCR SCREENING     Status: None   Collection Time    08/20/14  8:00 AM      Result Value Ref Range Status   MRSA by PCR NEGATIVE  NEGATIVE Final   Comment:            The GeneXpert MRSA Assay (FDA     approved for NASAL specimens     only), is one component of a     comprehensive MRSA colonization     surveillance program. It is not     intended to diagnose MRSA     infection nor to guide or     monitor treatment for     MRSA infections.     Studies:  Recent x-ray studies have been reviewed in detail by the Attending Physician  Scheduled Meds:  Scheduled Meds: . amLODipine  10 mg Oral Daily  . aspirin  81 mg Oral Daily  . atorvastatin  20 mg Oral q1800  . carvedilol  12.5 mg Oral BID WC  . cloNIDine  0.1 mg Oral Q2200  . darbepoetin (ARANESP) injection - DIALYSIS  25 mcg Intravenous Q Mon-HD  . famotidine  20 mg Oral Daily  . hydrALAZINE  75 mg Oral 3 times per day  . Influenza vac split quadrivalent PF  0.5 mL Intramuscular Tomorrow-1000  . insulin aspart  0-9 Units Subcutaneous 6 times per day  . isosorbide mononitrate  20 mg Oral BID  . levothyroxine  88 mcg Oral QAC breakfast  . multivitamin  1 tablet Oral QHS  . senna-docusate  2 tablet Oral BID  . sevelamer carbonate  1,600 mg Oral TID WC  . ticagrelor  90 mg Oral BID    Time spent on care of this patient: 35  mins   Danyell Awbrey T , MD   [facility] Office  [phone] Pager - Text Page per Loretha Stapler as per below:  On-Call/Text Page:      [email or web address]      password [email or web address]  If 7PM-7AM, please contact night-coverage [email or web address] Password [email or web address] 08/21/2014, 1:17 PM   LOS: 1 day

## 2014-08-21 NOTE — Progress Notes (Signed)
Right arterial groin sheath was removed at 0140 and pressure held for 30 minutes after to achieve hemostasis. Right dorsal pedialis pulse which was doplerable before the procedure and remains unchange post procedure. Patient tolerated procedure well. Procedure education was provided before and after the procedure.

## 2014-08-21 NOTE — Progress Notes (Signed)
Subjective:  No CP/SOB s/p LCX intervention  Objective:  Temp:  [97.9 F (36.6 C)-98.9 F (37.2 C)] 98 F (36.7 C) (09/15 1034) Pulse Rate:  [59-72] 70 (09/15 1034) Resp:  [12-28] 15 (09/15 1034) BP: (102-170)/(30-68) 144/52 mmHg (09/15 1034) SpO2:  [87 %-99 %] 98 % (09/15 1034) Arterial Line BP: (110-156)/(27-43) 111/30 mmHg (09/15 0135) Weight:  [102 lb 8.2 oz (46.5 kg)-113 lb 5.1 oz (51.4 kg)] 102 lb 8.2 oz (46.5 kg) (09/15 1034) Weight change: 2 lb 14 oz (1.304 kg)  Intake/Output from previous day: 09/14 0701 - 09/15 0700 In: 257 [P.O.:236; I.V.:21] Out: 1361 [Urine:250]  Intake/Output from this shift: Total I/O In: -  Out: 3000 [Other:3000]  Physical Exam: General appearance: alert and no distress Neck: no adenopathy, no carotid bruit, no JVD, supple, symmetrical, trachea midline and thyroid not enlarged, symmetric, no tenderness/mass/nodules Lungs: clear to auscultation bilaterally Heart: regular rate and rhythm, S1, S2 normal, no murmur, click, rub or gallop Extremities: extremities normal, atraumatic, no cyanosis or edema and Right groin OK  Lab Results: Results for orders placed during the hospital encounter of 08/20/14 (from the past 48 hour(s))  GLUCOSE, CAPILLARY     Status: Abnormal   Collection Time    08/20/14  2:58 AM      Result Value Ref Range   Glucose-Capillary >600 (*) 70 - 99 mg/dL  PRO B NATRIURETIC PEPTIDE     Status: Abnormal   Collection Time    08/20/14  3:23 AM      Result Value Ref Range   Pro B Natriuretic peptide (BNP) 9463.0 (*) 0 - 450 pg/mL  CBC WITH DIFFERENTIAL     Status: Abnormal   Collection Time    08/20/14  3:59 AM      Result Value Ref Range   WBC 8.1  4.0 - 10.5 K/uL   RBC 3.00 (*) 3.87 - 5.11 MIL/uL   Hemoglobin 9.8 (*) 12.0 - 15.0 g/dL   HCT 30.7 (*) 36.0 - 46.0 %   MCV 102.3 (*) 78.0 - 100.0 fL   MCH 32.7  26.0 - 34.0 pg   MCHC 31.9  30.0 - 36.0 g/dL   RDW 15.4  11.5 - 15.5 %   Platelets 197  150 - 400  K/uL   Neutrophils Relative % 90 (*) 43 - 77 %   Neutro Abs 7.3  1.7 - 7.7 K/uL   Lymphocytes Relative 6 (*) 12 - 46 %   Lymphs Abs 0.5 (*) 0.7 - 4.0 K/uL   Monocytes Relative 3  3 - 12 %   Monocytes Absolute 0.3  0.1 - 1.0 K/uL   Eosinophils Relative 1  0 - 5 %   Eosinophils Absolute 0.1  0.0 - 0.7 K/uL   Basophils Relative 0  0 - 1 %   Basophils Absolute 0.0  0.0 - 0.1 K/uL  BASIC METABOLIC PANEL     Status: Abnormal   Collection Time    08/20/14  3:59 AM      Result Value Ref Range   Sodium 124 (*) 137 - 147 mEq/L   Potassium 6.1 (*) 3.7 - 5.3 mEq/L   Chloride 84 (*) 96 - 112 mEq/L   CO2 21  19 - 32 mEq/L   Glucose, Bld 1027 (*) 70 - 99 mg/dL   Comment: CRITICAL RESULT CALLED TO, READ BACK BY AND VERIFIED WITH:     OLSEN,E RN 08/20/2014 0515 JORDANS     RESULT CONFIRMED BY  AUTOMATED DILUTION   BUN 75 (*) 6 - 23 mg/dL   Creatinine, Ser 4.76 (*) 0.50 - 1.10 mg/dL   Calcium 8.7  8.4 - 10.5 mg/dL   GFR calc non Af Amer 8 (*) >90 mL/min   GFR calc Af Amer 9 (*) >90 mL/min   Comment: (NOTE)     The eGFR has been calculated using the CKD EPI equation.     This calculation has not been validated in all clinical situations.     eGFR's persistently <90 mL/min signify possible Chronic Kidney     Disease.   Anion gap 19 (*) 5 - 15  I-STAT TROPOININ, ED     Status: None   Collection Time    08/20/14  4:05 AM      Result Value Ref Range   Troponin i, poc 0.07  0.00 - 0.08 ng/mL   Comment 3            Comment: Due to the release kinetics of cTnI,     a negative result within the first hours     of the onset of symptoms does not rule out     myocardial infarction with certainty.     If myocardial infarction is still suspected,     repeat the test at appropriate intervals.  GLUCOSE, CAPILLARY     Status: Abnormal   Collection Time    08/20/14  5:35 AM      Result Value Ref Range   Glucose-Capillary >600 (*) 70 - 99 mg/dL  GLUCOSE, CAPILLARY     Status: Abnormal   Collection Time     08/20/14  6:53 AM      Result Value Ref Range   Glucose-Capillary >600 (*) 70 - 99 mg/dL  GLUCOSE, CAPILLARY     Status: Abnormal   Collection Time    08/20/14  7:55 AM      Result Value Ref Range   Glucose-Capillary >600 (*) 70 - 99 mg/dL  MRSA PCR SCREENING     Status: None   Collection Time    08/20/14  8:00 AM      Result Value Ref Range   MRSA by PCR NEGATIVE  NEGATIVE   Comment:            The GeneXpert MRSA Assay (FDA     approved for NASAL specimens     only), is one component of a     comprehensive MRSA colonization     surveillance program. It is not     intended to diagnose MRSA     infection nor to guide or     monitor treatment for     MRSA infections.  TROPONIN I     Status: Abnormal   Collection Time    08/20/14  8:21 AM      Result Value Ref Range   Troponin I 0.93 (*) <0.30 ng/mL   Comment:            Due to the release kinetics of cTnI,     a negative result within the first hours     of the onset of symptoms does not rule out     myocardial infarction with certainty.     If myocardial infarction is still suspected,     repeat the test at appropriate intervals.     CRITICAL RESULT CALLED TO, READ BACK BY AND VERIFIED WITH:     B.WHELAN,RN(NP) 1230 08/20/14 CLARK,S  CBC WITH DIFFERENTIAL  Status: Abnormal   Collection Time    08/20/14  8:21 AM      Result Value Ref Range   WBC 9.2  4.0 - 10.5 K/uL   RBC 2.66 (*) 3.87 - 5.11 MIL/uL   Hemoglobin 8.8 (*) 12.0 - 15.0 g/dL   HCT 25.6 (*) 36.0 - 46.0 %   MCV 96.2  78.0 - 100.0 fL   MCH 33.1  26.0 - 34.0 pg   MCHC 34.4  30.0 - 36.0 g/dL   RDW 15.2  11.5 - 15.5 %   Platelets 208  150 - 400 K/uL   Neutrophils Relative % 86 (*) 43 - 77 %   Neutro Abs 7.9 (*) 1.7 - 7.7 K/uL   Lymphocytes Relative 7 (*) 12 - 46 %   Lymphs Abs 0.6 (*) 0.7 - 4.0 K/uL   Monocytes Relative 7  3 - 12 %   Monocytes Absolute 0.7  0.1 - 1.0 K/uL   Eosinophils Relative 0  0 - 5 %   Eosinophils Absolute 0.0  0.0 - 0.7 K/uL    Basophils Relative 0  0 - 1 %   Basophils Absolute 0.0  0.0 - 0.1 K/uL  BASIC METABOLIC PANEL     Status: Abnormal   Collection Time    08/20/14  8:21 AM      Result Value Ref Range   Sodium 130 (*) 137 - 147 mEq/L   Potassium 4.1  3.7 - 5.3 mEq/L   Chloride 88 (*) 96 - 112 mEq/L   CO2 21  19 - 32 mEq/L   Glucose, Bld 630 (*) 70 - 99 mg/dL   Comment: CRITICAL RESULT CALLED TO, READ BACK BY AND VERIFIED WITH:     R.RICH,RN 1247 08/20/14 CLARK,S   BUN 78 (*) 6 - 23 mg/dL   Creatinine, Ser 5.04 (*) 0.50 - 1.10 mg/dL   Calcium 9.0  8.4 - 10.5 mg/dL   GFR calc non Af Amer 7 (*) >90 mL/min   GFR calc Af Amer 9 (*) >90 mL/min   Comment: (NOTE)     The eGFR has been calculated using the CKD EPI equation.     This calculation has not been validated in all clinical situations.     eGFR's persistently <90 mL/min signify possible Chronic Kidney     Disease.   Anion gap 21 (*) 5 - 15  GLUCOSE, CAPILLARY     Status: Abnormal   Collection Time    08/20/14  8:59 AM      Result Value Ref Range   Glucose-Capillary >600 (*) 70 - 99 mg/dL   Comment 1 Documented in Chart     Comment 2 Notify RN    GLUCOSE, CAPILLARY     Status: Abnormal   Collection Time    08/20/14 10:02 AM      Result Value Ref Range   Glucose-Capillary >600 (*) 70 - 99 mg/dL  GLUCOSE, CAPILLARY     Status: Abnormal   Collection Time    08/20/14 11:11 AM      Result Value Ref Range   Glucose-Capillary 336 (*) 70 - 99 mg/dL  GLUCOSE, CAPILLARY     Status: Abnormal   Collection Time    08/20/14 12:21 PM      Result Value Ref Range   Glucose-Capillary 207 (*) 70 - 99 mg/dL  RENAL FUNCTION PANEL     Status: Abnormal   Collection Time    08/20/14  1:00 PM  Result Value Ref Range   Sodium 140  137 - 147 mEq/L   Potassium 2.8 (*) 3.7 - 5.3 mEq/L   Comment: CRITICAL RESULT CALLED TO, READ BACK BY AND VERIFIED WITH:     RENEE RICH,RN AT 6837 08/20/14 BY ZBEECH.   Chloride 98  96 - 112 mEq/L   CO2 25  19 - 32 mEq/L    Glucose, Bld 212 (*) 70 - 99 mg/dL   BUN 31 (*) 6 - 23 mg/dL   Comment: DIALYSIS   Creatinine, Ser 2.22 (*) 0.50 - 1.10 mg/dL   Comment: DIALYSIS   Calcium 8.8  8.4 - 10.5 mg/dL   Phosphorus 1.3 (*) 2.3 - 4.6 mg/dL   Albumin 2.8 (*) 3.5 - 5.2 g/dL   GFR calc non Af Amer 20 (*) >90 mL/min   GFR calc Af Amer 23 (*) >90 mL/min   Comment: (NOTE)     The eGFR has been calculated using the CKD EPI equation.     This calculation has not been validated in all clinical situations.     eGFR's persistently <90 mL/min signify possible Chronic Kidney     Disease.   Anion gap 17 (*) 5 - 15  GLUCOSE, CAPILLARY     Status: Abnormal   Collection Time    08/20/14  1:38 PM      Result Value Ref Range   Glucose-Capillary 182 (*) 70 - 99 mg/dL  GLUCOSE, CAPILLARY     Status: Abnormal   Collection Time    08/20/14  2:41 PM      Result Value Ref Range   Glucose-Capillary 160 (*) 70 - 99 mg/dL  GLUCOSE, CAPILLARY     Status: Abnormal   Collection Time    08/20/14  3:45 PM      Result Value Ref Range   Glucose-Capillary 105 (*) 70 - 99 mg/dL  TROPONIN I     Status: Abnormal   Collection Time    08/20/14  4:18 PM      Result Value Ref Range   Troponin I 1.22 (*) <0.30 ng/mL   Comment:            Due to the release kinetics of cTnI,     a negative result within the first hours     of the onset of symptoms does not rule out     myocardial infarction with certainty.     If myocardial infarction is still suspected,     repeat the test at appropriate intervals.     CRITICAL VALUE NOTED.  VALUE IS CONSISTENT WITH PREVIOUSLY REPORTED AND CALLED VALUE.  BASIC METABOLIC PANEL     Status: Abnormal   Collection Time    08/20/14  4:18 PM      Result Value Ref Range   Sodium 139  137 - 147 mEq/L   Potassium 3.6 (*) 3.7 - 5.3 mEq/L   Chloride 98  96 - 112 mEq/L   CO2 26  19 - 32 mEq/L   Glucose, Bld 71  70 - 99 mg/dL   BUN 46 (*) 6 - 23 mg/dL   Creatinine, Ser 3.44 (*) 0.50 - 1.10 mg/dL   Comment:  DELTA CHECK NOTED   Calcium 9.2  8.4 - 10.5 mg/dL   GFR calc non Af Amer 12 (*) >90 mL/min   GFR calc Af Amer 14 (*) >90 mL/min   Comment: (NOTE)     The eGFR has been calculated using the CKD EPI equation.  This calculation has not been validated in all clinical situations.     eGFR's persistently <90 mL/min signify possible Chronic Kidney     Disease.   Anion gap 15  5 - 15  CBC     Status: Abnormal   Collection Time    08/20/14  4:18 PM      Result Value Ref Range   WBC 10.6 (*) 4.0 - 10.5 K/uL   RBC 2.76 (*) 3.87 - 5.11 MIL/uL   Hemoglobin 9.1 (*) 12.0 - 15.0 g/dL   HCT 26.0 (*) 36.0 - 46.0 %   MCV 94.2  78.0 - 100.0 fL   MCH 33.0  26.0 - 34.0 pg   MCHC 35.0  30.0 - 36.0 g/dL   RDW 15.3  11.5 - 15.5 %   Platelets 200  150 - 400 K/uL  POTASSIUM     Status: None   Collection Time    08/20/14  4:18 PM      Result Value Ref Range   Potassium 3.7  3.7 - 5.3 mEq/L   Comment: NO VISIBLE HEMOLYSIS  GLUCOSE, CAPILLARY     Status: Abnormal   Collection Time    08/20/14  4:48 PM      Result Value Ref Range   Glucose-Capillary 58 (*) 70 - 99 mg/dL  GLUCOSE, CAPILLARY     Status: Abnormal   Collection Time    08/20/14  4:50 PM      Result Value Ref Range   Glucose-Capillary 56 (*) 70 - 99 mg/dL  GLUCOSE, CAPILLARY     Status: Abnormal   Collection Time    08/20/14  5:09 PM      Result Value Ref Range   Glucose-Capillary 124 (*) 70 - 99 mg/dL  POCT ACTIVATED CLOTTING TIME     Status: None   Collection Time    08/20/14  8:53 PM      Result Value Ref Range   Activated Clotting Time 242    GLUCOSE, CAPILLARY     Status: Abnormal   Collection Time    08/20/14  8:54 PM      Result Value Ref Range   Glucose-Capillary 66 (*) 70 - 99 mg/dL  GLUCOSE, CAPILLARY     Status: Abnormal   Collection Time    08/20/14  9:29 PM      Result Value Ref Range   Glucose-Capillary 108 (*) 70 - 99 mg/dL  POCT ACTIVATED CLOTTING TIME     Status: None   Collection Time    08/20/14 10:19 PM       Result Value Ref Range   Activated Clotting Time 326    GLUCOSE, CAPILLARY     Status: Abnormal   Collection Time    08/21/14 12:00 AM      Result Value Ref Range   Glucose-Capillary 199 (*) 70 - 99 mg/dL   Comment 1 Arterial Sample    POCT ACTIVATED CLOTTING TIME     Status: None   Collection Time    08/21/14 12:01 AM      Result Value Ref Range   Activated Clotting Time 152    POCT ACTIVATED CLOTTING TIME     Status: None   Collection Time    08/21/14  1:24 AM      Result Value Ref Range   Activated Clotting Time 129    CBC     Status: Abnormal   Collection Time    08/21/14  2:48 AM  Result Value Ref Range   WBC 8.8  4.0 - 10.5 K/uL   RBC 2.42 (*) 3.87 - 5.11 MIL/uL   Hemoglobin 7.9 (*) 12.0 - 15.0 g/dL   HCT 23.2 (*) 36.0 - 46.0 %   MCV 95.9  78.0 - 100.0 fL   MCH 32.6  26.0 - 34.0 pg   MCHC 34.1  30.0 - 36.0 g/dL   RDW 15.8 (*) 11.5 - 15.5 %   Platelets 179  150 - 400 K/uL  BASIC METABOLIC PANEL     Status: Abnormal   Collection Time    08/21/14  2:48 AM      Result Value Ref Range   Sodium 135 (*) 137 - 147 mEq/L   Potassium 5.2  3.7 - 5.3 mEq/L   Comment: DELTA CHECK NOTED     NO VISIBLE HEMOLYSIS   Chloride 97  96 - 112 mEq/L   CO2 25  19 - 32 mEq/L   Glucose, Bld 130 (*) 70 - 99 mg/dL   BUN 48 (*) 6 - 23 mg/dL   Creatinine, Ser 3.69 (*) 0.50 - 1.10 mg/dL   Calcium 8.1 (*) 8.4 - 10.5 mg/dL   GFR calc non Af Amer 11 (*) >90 mL/min   GFR calc Af Amer 13 (*) >90 mL/min   Comment: (NOTE)     The eGFR has been calculated using the CKD EPI equation.     This calculation has not been validated in all clinical situations.     eGFR's persistently <90 mL/min signify possible Chronic Kidney     Disease.   Anion gap 13  5 - 15  GLUCOSE, CAPILLARY     Status: Abnormal   Collection Time    08/21/14  4:56 AM      Result Value Ref Range   Glucose-Capillary 180 (*) 70 - 99 mg/dL   Comment 1 Capillary Sample      Imaging: Imaging results have been  reviewed  Assessment/Plan:   1. Principal Problem: 2.   Acute pulmonary edema 3. Active Problems: 4.   Chronic diastolic heart failure 5.   End stage renal disease 6.   Chest pain 7.   Hyperosmolar non-ketotic state in patient with type 2 diabetes mellitus 8.   Time Spent Directly with Patient:  20 minutes  Length of Stay:  LOS: 1 day   Pt admitted with [place]. Enz neg. H/O CAD in the past. Other medical probs as outlined. Pt is s/p cath revealing a patent LCX stent with high grade calcified prox LCX disease.She had PCI/stent with DES. On DAPT.  She tolerated HD well today without CP. Exam benign. Groin OK. Had HD today without a prob. No further cardiac issues. BNP elevated and had CHF on CXR but volume can be adjusted at HD. Will follow along with you. Can probably be tx to tele.  Lorretta Harp 08/21/2014, 12:09 PM

## 2014-08-21 NOTE — Procedures (Signed)
Patient was seen on dialysis and the procedure was supervised. BFR 400 Via LAVF BP is 120/50.  Patient appears to be tolerating treatment well.  Mrs. Douse is s/p PTA and drug-eluting stent of LCx and feels much better today.  Will need to cont with PT/OT due to severe deconditioning and has heel decubiti.

## 2014-08-21 NOTE — Progress Notes (Signed)
INITIAL NUTRITION ASSESSMENT  Patient meets the criteria for severe MALNUTRITION in the context of chronic illness with 27% weight loss in 12 months, PO intake <75% of estimated needs, and muscle and subcutaneous fat depletion  DOCUMENTATION CODES Per approved criteria  -Severe malnutrition in the context of chronic illness   INTERVENTION: -Resource Breeze po once daily, provides 250 kcal, 9 g protein, increase if PO intake does not improve.  -Patient encouraged to order foods per preference.   NUTRITION DIAGNOSIS: Inadequate oral intake related to decreased appetite as evidenced by 25% meal intake.   Goal: Patient will meet >/=90% of estimated nutrition needs  Monitor:  PO intake, weight, labs, I/Os  Reason for Assessment: Malnutrition screening tool  79 y.o. female  Admitting Dx: Acute pulmonary edema  ASSESSMENT: 79 year old female with history of ESRD on hemodialysis, recently discharged from hospital with left acetabular fracture. Patient brought to ER complaining of chest pressure and shortness of breath. Patient states blood glucose has been erratic sometimes in the 500s, sometimes in the 60s.   Patient was not eating well during previous admission, but reports that her appetite was improving after discharge. Diet just advanced this afternoon, with about 25% intake of lunch. She reports that her appetite is decreased, but she also did not like the food choices. She did receive Resource Breeze at previous admission, which she liked. Will order once daily due to erratic blood glucose and increase if PO intake remains poor.   She reports a usual weight of 140 pounds last year, which is a 27% weight loss.   Nutrition Focused Physical Exam:  Subcutaneous Fat:  Orbital Region: moderate depletion Upper Arm Region: moderate depletion Thoracic and Lumbar Region: N/A  Muscle:  Temple Region: moderate depletion Clavicle Bone Region: severe depletion Clavicle and Acromion Bone  Region: severe depletion Scapular Bone Region: moderate depletion Dorsal Hand: moderate depletion Patellar Region: moderate depletion Anterior Thigh Region: moderate depletion Posterior Calf Region: moderate depletion  Edema: none  Height: Ht Readings from Last 1 Encounters:  08/20/14  (1.626 m)    Weight: Wt Readings from Last 1 Encounters:  08/21/14 102 lb 8.2 oz (46.5 kg)    Ideal Body Weight: 120 pounds  % Ideal Body Weight: 85%  Wt Readings from Last 10 Encounters:  08/21/14 102 lb 8.2 oz (46.5 kg)  08/21/14 102 lb 8.2 oz (46.5 kg)  07/25/14 97 lb 3.6 oz (44.101 kg)  05/22/14 109 lb 12.8 oz (49.805 kg)  12/26/13 122 lb 9.6 oz (55.611 kg)  11/10/13 129 lb 10.1 oz (58.8 kg)  11/10/13 129 lb 10.1 oz (58.8 kg)  11/10/13 129 lb 10.1 oz (58.8 kg)  10/17/13 146 lb (66.225 kg)  10/02/13 140 lb (63.504 kg)    Usual Body Weight: 140 pounds  % Usual Body Weight: 73%  BMI:  Body mass index is 17.59 kg/(m^2). Patient is underweight.   Estimated Nutritional Needs: Kcal: 1350-1450 kcal Protein: 65-75 g Fluid: Per MD  Skin: Deep tissue wound heel, incision left leg and foot  Diet Order: Carb Control  EDUCATION NEEDS: -No education needs identified at this time   Intake/Output Summary (Last 24 hours) at 08/21/14 1412 Last data filed at 08/21/14 1034  Gross per 24 hour  Intake    257 ml  Output   3250 ml  Net  -2993 ml    Last BM: 9/15  Labs:   Recent Labs Lab 08/20/14 1300 08/20/14 1618 08/21/14 0248  NA 140 139 135*  K 2.8* 3.6*  3.7 5.2  CL 98 98 97  CO2 BUN 31* 46* 48*  CREATININE 2.22* 3.44* 3.69*  CALCIUM 8.8 9.2 8.1*  PHOS 1.3*  --   --   GLUCOSE 212* 71 130*    CBG (last 3)   Recent Labs  08/21/14 08/21/14 0456 08/21/14 1312  GLUCAP 199* 180* 293*    Scheduled Meds: . amLODipine  10 mg Oral Daily  . aspirin  81 mg Oral Daily  . atorvastatin  20 mg Oral q1800  . carvedilol  12.5 mg Oral BID WC  . cloNIDine   0.1 mg Oral Q2200  . darbepoetin (ARANESP) injection - DIALYSIS  25 mcg Intravenous Q Mon-HD  . docusate sodium  100 mg Oral BID  . famotidine  20 mg Oral Daily  . heparin subcutaneous  5,000 Units Subcutaneous 3 times per day  . hydrALAZINE  75 mg Oral 3 times per day  . insulin aspart  0-9 Units Subcutaneous TID WC  . insulin detemir  12 Units Subcutaneous QHS  . isosorbide mononitrate  20 mg Oral BID  . levothyroxine  88 mcg Oral QAC breakfast  . multivitamin  1 tablet Oral QHS  . senna-docusate  2 tablet Oral BID  . sevelamer carbonate  1,600 mg Oral TID WC  . ticagrelor  90 mg Oral BID    Continuous Infusions:   Past Medical History  Diagnosis Date  . Retinopathy   . Hypertension   . Chronic diastolic CHF (congestive heart failure)     has a normal EF per echo 03/2011 & 04/2012 with diastolic dysfunction  . CAD (coronary artery disease)     Prior PCI in 1998 x 2; s/p cath in 2001, negative Myoview in 2008 & 2011, s/p cath in June 2012 with calcified disease of the proximal LCX with normal flow reserve. She has been managed medically due to her other morbidities  . Cerebrovascular disease   . Peripheral vascular disease     prior angiography showing RSFA stenosis, left anterior tibial and left posterior tibial stenoses  . Hypercholesterolemia   . Diabetes mellitus     insulin dependent  . Hypothyroidism   . Hiatal hernia   . Degenerative joint disease   . Osteoporosis   . Vitamin D deficiency   . Anemia of chronic disease   . ESRD (end stage renal disease)     a. CKD progressed to ESRD 10/2013. b. s/p LUE basilic transposition by vascular on 11/08/13.  . Dialysis patient     [facility], and Friday  . Hip fracture, left     Past Surgical History  Procedure Laterality Date  . Abdominal aortogram  07/27/2003    by Dr. Chales Abrahams  . Bilateral lower extremity angiogram  07/27/2003    by Dr. Chales Abrahams  . Selective angiography,right superficial femoral artery, right and left iliac  arteries  07/27/2003    by Dr. Chales Abrahams  . Measurement of gradient right and left iliac arteries  07/27/2003    by Dr. Chales Abrahams  . Bilateral cataract sugery  2000 and 2002    by Dr. Cecilie Kicks  . Repair left hip fracture  04/2008    by Dr. Charlann Boxer  . Bascilic vein transposition Left 11/08/2013    Procedure: BASCILIC VEIN TRANSPOSITION;  Surgeon: Pryor Ochoa, MD;  Location: [facility] OR;  Service: Vascular;  Laterality: Left;  Marland Kitchen Eye surgery      Vision loss in Right Eye  Larey Seat, RD, LDN Pager #: [phone] After-Hours Pager #: [phone]

## 2014-08-21 NOTE — Consult Note (Signed)
WOC wound consult note Reason for Consult: Consult requested for left heel and left calf wounds. Wound type: Left heel with dark purple deep tissue injury; 1.2X1.2cm.  No open wound, fluctuance, or drainage. Left posterior calf with full thickness abrasion; pt states she fell at home more than a week ago. 4X1X.2cm, 20% dark brown scabbed area, 80% dry red wound bed. Laft anterior foot with .2X.2cm stage 1 wound where bone protrudes underneath skin level. Pressure Ulcer POA: Yes to left heel and left foot. Dressing procedure/placement/frequency: Vaseline dressing to left posterior leg to promote moist healing.  Foam dressing to left heel and left foot to promote healing.  Float left heel to reduce pressure. Please re-consult if further assistance is needed.  Thank-you,  Cammie Mcgee MSN, RN, CWOCN, [place], CNS [phone]

## 2014-08-22 ENCOUNTER — Encounter: Payer: Self-pay | Admitting: Internal Medicine

## 2014-08-22 ENCOUNTER — Encounter (HOSPITAL_COMMUNITY): Payer: Self-pay | Admitting: Interventional Cardiology

## 2014-08-22 DIAGNOSIS — I5032 Chronic diastolic (congestive) heart failure: Secondary | ICD-10-CM

## 2014-08-22 DIAGNOSIS — I2129 ST elevation (STEMI) myocardial infarction involving other sites: Secondary | ICD-10-CM | POA: Diagnosis present

## 2014-08-22 DIAGNOSIS — E039 Hypothyroidism, unspecified: Secondary | ICD-10-CM

## 2014-08-22 DIAGNOSIS — I10 Essential (primary) hypertension: Secondary | ICD-10-CM

## 2014-08-22 DIAGNOSIS — D509 Iron deficiency anemia, unspecified: Secondary | ICD-10-CM

## 2014-08-22 LAB — CBC
HEMATOCRIT: 21.6 % — AB (ref 36.0–46.0)
Hemoglobin: 7.2 g/dL — ABNORMAL LOW (ref 12.0–15.0)
MCH: 32.7 pg (ref 26.0–34.0)
MCHC: 33.3 g/dL (ref 30.0–36.0)
MCV: 98.2 fL (ref 78.0–100.0)
PLATELETS: 166 10*3/uL (ref 150–400)
RBC: 2.2 MIL/uL — AB (ref 3.87–5.11)
RDW: 16.5 % — ABNORMAL HIGH (ref 11.5–15.5)
WBC: 6.9 10*3/uL (ref 4.0–10.5)

## 2014-08-22 LAB — GLUCOSE, CAPILLARY
GLUCOSE-CAPILLARY: 324 mg/dL — AB (ref 70–99)
Glucose-Capillary: <10 mg/dL — CL (ref 70–99)
Glucose-Capillary: 133 mg/dL — ABNORMAL HIGH (ref 70–99)
Glucose-Capillary: 83 mg/dL (ref 70–99)
Glucose-Capillary: 70 mg/dL (ref 70–99)
Glucose-Capillary: 79 mg/dL (ref 70–99)
Glucose-Capillary: 123 mg/dL — ABNORMAL HIGH (ref 70–99)
Glucose-Capillary: 164 mg/dL — ABNORMAL HIGH (ref 70–99)
Glucose-Capillary: 233 mg/dL — ABNORMAL HIGH (ref 70–99)

## 2014-08-22 LAB — BASIC METABOLIC PANEL
ANION GAP: 15 (ref 5–15)
BUN: 30 mg/dL — ABNORMAL HIGH (ref 6–23)
CALCIUM: 8.5 mg/dL (ref 8.4–10.5)
CO2: 25 mEq/L (ref 19–32)
CREATININE: 3.21 mg/dL — AB (ref 0.50–1.10)
Chloride: 92 mEq/L — ABNORMAL LOW (ref 96–112)
GFR calc Af Amer: 15 mL/min — ABNORMAL LOW (ref >90)
GFR, EST NON AFRICAN AMERICAN: 13 mL/min — AB (ref >90)
Glucose, Bld: 88 mg/dL (ref 70–99)
Potassium: 4.2 mEq/L (ref 3.7–5.3)
Sodium: 132 mEq/L — ABNORMAL LOW (ref 137–147)

## 2014-08-22 LAB — PREPARE RBC (CROSSMATCH)

## 2014-08-22 MED ORDER — DEXTROSE 50 % IV SOLN
INTRAVENOUS | Status: AC
  Filled 2014-08-22: qty 50

## 2014-08-22 MED ORDER — SODIUM CHLORIDE 0.9 % IV SOLN
Freq: Once | INTRAVENOUS | Status: AC
  Administered 2014-08-22: 16:00:00 via INTRAVENOUS

## 2014-08-22 MED ORDER — INSULIN DETEMIR 100 UNIT/ML ~~LOC~~ SOLN
5.0000 [IU] | Freq: Every day | SUBCUTANEOUS | Status: DC
  Administered 2014-08-22: 5 [IU] via SUBCUTANEOUS
  Filled 2014-08-22 (×2): qty 0.05

## 2014-08-22 MED ORDER — GLUCAGON HCL RDNA (DIAGNOSTIC) 1 MG IJ SOLR
INTRAMUSCULAR | Status: AC
  Filled 2014-08-22: qty 1

## 2014-08-22 MED ORDER — DEXTROSE 5 % IV SOLN
INTRAVENOUS | Status: AC
  Administered 2014-08-22: 10:00:00 via INTRAVENOUS

## 2014-08-22 NOTE — Assessment & Plan Note (Signed)
On dialysis which started 11/2013

## 2014-08-22 NOTE — Assessment & Plan Note (Addendum)
Did not have any problems with this during hospitalization at [facility]; on ASA, Statin, bblocker

## 2014-08-22 NOTE — Assessment & Plan Note (Signed)
Continue levothyroxine 

## 2014-08-22 NOTE — Assessment & Plan Note (Addendum)
Reported problems with highs and lows at [facility];nursing here reports lows at 4am; will d/c novolog with snack and continue same levemir; A1c 8.6 at [facility] 07/2014

## 2014-08-22 NOTE — Assessment & Plan Note (Signed)
Per ortho at [facility], 8 weeks non weight bearing, some at  at [facility]

## 2014-08-22 NOTE — Progress Notes (Signed)
Patient CBG was <10 this AM.  Patient was awake but combative, diaphoretic and confused. Gave 1 administration of D50 IV push. CBG now 133.  Also gave peanut butter and grahm crackers.  Patient is now awake, alert, and responding to all questions. Will continue to monitor CBG results.

## 2014-08-22 NOTE — Assessment & Plan Note (Signed)
As above, no surgery, non weight bearing

## 2014-08-22 NOTE — Assessment & Plan Note (Signed)
LDL -70 and HDL 60 on crestor 20 mg in 05/2014

## 2014-08-22 NOTE — Progress Notes (Signed)
Patient ID: Nicole [name], female   DOB: May 19, 1935, 79 y.o.   MRN: [id number]  [facility] Progress Note    Subjective:   Feels "bad", dropped her CBG this am to <10 given D50. Still feels weak   Objective:   BP 113/33  Pulse 51  Temp(Src) 97.6 F (36.4 C) (Oral)  Resp 18  Ht  (1.626 m)  Wt 46.4 kg (102 lb 4.7 oz)  BMI 17.55 kg/m2  SpO2 94%  Intake/Output: I/O last 3 completed shifts: In: 773 [P.O.:773] Out: 3250 [Urine:250; Other:3000]   Intake/Output this shift:  Total I/O In: 240 [P.O.:240] Out: -  Weight change: -1.6 kg (-3 lb 8.4 oz)  Physical Exam: XBJ:YNWGN WF, slowed mentation but oriented and appropriate CVS:no rub Resp:cta FAO:ZHYQMV Ext:LUE AVF +T/B, no edema  Labs: BMET  Recent Labs Lab 08/20/14 0359 08/20/14 0821 08/20/14 1300 08/20/14 1618 08/21/14 0248  NA 124* 130* 140 139 135*  K 6.1* 4.1 2.8* 3.6*  3.7 5.2  CL 84* 88* 98 98 97  CO2 GLUCOSE 1027* 630* 212* 71 130*  BUN 75* 78* 31* 46* 48*  CREATININE 4.76* 5.04* 2.22* 3.44* 3.69*  ALBUMIN  --   --  2.8*  --   --   CALCIUM 8.7 9.0 8.8 9.2 8.1*  PHOS  --   --  1.3*  --   --    CBC  Recent Labs Lab 08/20/14 0359 08/20/14 0821 08/20/14 1618 08/21/14 0248 08/22/14 0257  WBC 8.1 9.2 10.6* 8.8 6.9  NEUTROABS 7.3 7.9*  --   --   --   HGB 9.8* 8.8* 9.1* 7.9* 7.2*  HCT 30.7* 25.6* 26.0* 23.2* 21.6*  MCV 102.3* 96.2 94.2 95.9 98.2  PLT 197 208 200 179 166    @ Medications:    . amLODipine  10 mg Oral Daily  . aspirin  81 mg Oral Daily  . atorvastatin  20 mg Oral q1800  . carvedilol  12.5 mg Oral BID WC  . cloNIDine  0.1 mg Oral Q2200  . darbepoetin (ARANESP) injection - DIALYSIS  25 mcg Intravenous Q Mon-HD  . dextrose      . docusate sodium  100 mg Oral BID  . famotidine  20 mg Oral Daily  . feeding supplement (RESOURCE BREEZE)  1 Container Oral Q24H  . glucagon (human recombinant)      . heparin subcutaneous  5,000  Units Subcutaneous 3 times per day  . hydrALAZINE  75 mg Oral 3 times per day  . insulin aspart  0-9 Units Subcutaneous TID WC  . isosorbide mononitrate  20 mg Oral BID  . levothyroxine  88 mcg Oral QAC breakfast  . multivitamin  1 tablet Oral QHS  . senna-docusate  2 tablet Oral BID  . sevelamer carbonate  1,600 mg Oral TID WC  . ticagrelor  90 mg Oral BID    Dialysis Orders: MWF @ [facility]  46.5 kg 2K/2.25ca 3hr36min 2000 Heparin 350/1.5 L AVF  Aranesp 25 q week  Assessment/Plan:  1. CAD s/p drug-eluting stent in L Cx- No recurrent CP.  2. Hypoglycemia- glucose <10 this am given D50 and repeat 133, still drowsy/feels bad.  Recheck RFP.  Was hyperglycemic on admission, adjustments to insulin per primary 3. ESRD - MWF AF. Off schedule, will cont with TTS for now and get back on schedule Monday.  4. Hypertension/volume - At goal with reaching her edw. Norvasc, coreg, clonidine, hydralazine  5.  Anemia - hgb 9.8. Aranesp 25, give today 6. Metabolic bone disease - Ca+8.7 PTH 163 7. Nutrition - NPO. Renal vit 8. L hip fracture- follow up xray 9/30- nonsurgical management 9.   Saba Gomm A 08/22/2014, 9:29 AM

## 2014-08-22 NOTE — Progress Notes (Signed)
Patient Name: Nicole [name] Date of Encounter: 08/22/2014     Principal Problem:   Acute pulmonary edema Active Problems:   Chronic diastolic heart failure   End stage renal disease   Chest pain   Hyperosmolar non-ketotic state in patient with type 2 diabetes mellitus   Acute myocardial infarction of other lateral wall, initial episode of care    SUBJECTIVE  Denies any CP. Some shortness of breath and severe weakness this morning.  CURRENT MEDS . amLODipine  10 mg Oral Daily  . aspirin  81 mg Oral Daily  . atorvastatin  20 mg Oral q1800  . carvedilol  12.5 mg Oral BID WC  . cloNIDine  0.1 mg Oral Q2200  . darbepoetin (ARANESP) injection - DIALYSIS  25 mcg Intravenous Q Mon-HD  . dextrose      . docusate sodium  100 mg Oral BID  . famotidine  20 mg Oral Daily  . feeding supplement (RESOURCE BREEZE)  1 Container Oral Q24H  . glucagon (human recombinant)      . heparin subcutaneous  5,000 Units Subcutaneous 3 times per day  . hydrALAZINE  75 mg Oral 3 times per day  . insulin aspart  0-9 Units Subcutaneous TID WC  . insulin detemir  5 Units Subcutaneous QHS  . isosorbide mononitrate  20 mg Oral BID  . levothyroxine  88 mcg Oral QAC breakfast  . multivitamin  1 tablet Oral QHS  . senna-docusate  2 tablet Oral BID  . sevelamer carbonate  1,600 mg Oral TID WC  . ticagrelor  90 mg Oral BID    OBJECTIVE  Filed Vitals:   08/21/14 2030 08/22/14 0148 08/22/14 0556 08/22/14 0741  BP: 130/33 111/33 167/44 113/33  Pulse: 64 63 64 51  Temp: 98.7 F (37.1 C) 99.1 F (37.3 C) 97.6 F (36.4 C)   TempSrc: Oral Oral Oral   Resp: Height:      Weight:   102 lb 4.7 oz (46.4 kg)   SpO2: 97% 97% 95% 94%    Intake/Output Summary (Last 24 hours) at 08/22/14 1326 Last data filed at 08/22/14 0921  Gross per 24 hour  Intake    537 ml  Output      0 ml  Net    537 ml   Filed Weights   08/21/14 1034 08/21/14 1806 08/22/14 0556  Weight: 102 lb 8.2 oz (46.5 kg)  105 lb 6.1 oz (47.8 kg) 102 lb 4.7 oz (46.4 kg)    PHYSICAL EXAM  General: Pleasant, NAD. Neuro: Alert and oriented X 3. Moves all extremities spontaneously. Psych: Normal affect. HEENT:  Normal  Neck: Supple without bruits or JVD. Lungs:  Resp regular and unlabored, CTA. Heart: RRR no s3, s4, or murmurs. Abdomen: Soft, non-tender, non-distended, BS + x 4.  Extremities: No clubbing, cyanosis or edema. DP/PT/Radials 2+ and equal bilaterally.  Accessory Clinical Findings  CBC  Recent Labs  08/20/14 0359 08/20/14 0821  08/21/14 0248 08/22/14 0257  WBC 8.1 9.2  < > 8.8 6.9  NEUTROABS 7.3 7.9*  --   --   --   HGB 9.8* 8.8*  < > 7.9* 7.2*  HCT 30.7* 25.6*  < > 23.2* 21.6*  MCV 102.3* 96.2  < > 95.9 98.2  PLT 197 208  < > 179 166  < > = values in this interval not displayed. Basic Metabolic Panel  Recent Labs  08/20/14 1300  08/21/14 0248 08/22/14 1059  NA 140  < > 135* 132*  K 2.8*  < > 5.2 4.2  CL 98  < > 97 92*  CO2 25  < > 25 25  GLUCOSE 212*  < > 130* 88  BUN 31*  < > 48* 30*  CREATININE 2.22*  < > 3.69* 3.21*  CALCIUM 8.8  < > 8.1* 8.5  PHOS 1.3*  --   --   --   < > = values in this interval not displayed. Liver Function Tests  Recent Labs  08/20/14 1300  ALBUMIN 2.8*   Cardiac Enzymes  Recent Labs  08/20/14 0821 08/20/14 1618  TROPONINI 0.93* 1.22*    TELE NSR with HR 50-60s, no significant ventricular ectopy, possible QTc prolongation    ECG  No new EKG  Echocardiogram  LV EF: 50% - 55%  ------------------------------------------------------------------- Study Conclusions  - Left ventricle: The cavity size was normal. Wall thickness was increased in a pattern of mild LVH. Systolic function was normal. The estimated ejection fraction was in the range of 50% to 55%. - Mitral valve: There was mild regurgitation. - Left atrium: The atrium was moderately dilated.      Radiology/Studies  Dg Wrist Complete Left  08-06-2014    CLINICAL DATA:  Pain after falling yesterday. Pain is in the region of the ulnar head.  EXAM: LEFT WRIST - COMPLETE 3+ VIEW  COMPARISON:  None.  FINDINGS: Bones appear osteopenic. There is no acute fracture. There is degenerative change at the first carpometacarpal joint. There is atherosclerotic calcification of the small arteries of the wrist.  IMPRESSION: No evidence for acute  abnormality.   Electronically Signed   By: Rosalie Gums M.D.   On: 06-Aug-2014 17:54   Ct Pelvis Wo Contrast  08-06-2014   CLINICAL DATA:  Left acetabular fracture.  Preoperative planning.  EXAM: CT PELVIS WITHOUT CONTRAST, CT 3D RECON AT SCANNER  TECHNIQUE: Multidetector CT imaging of the pelvis was performed following the standard protocol without intravenous contrast.  3-dimensional CT images were rendered by post-processing of the original CT data at the CT scanner. The 3-dimensional CT images were interpreted, and findings were reported in the accompanying complete CT report for this study.  COMPARISON:  Left hip and pelvic radiographs 01/23/2014 and 07/22/2014.  FINDINGS: The bones are diffusely demineralized. The patient has undergone previous pinning of a subcapital fracture of the left femoral neck. This fracture appears healed with mild residual posttraumatic deformity. There is no evidence of acute proximal femur fracture or dislocation.  As demonstrated radiographically, there is an extensively comminuted and displaced fracture of the left acetabulum. This fracture involves the acetabular roof and anterior column, resulting in up to 3 cm of medial and 2.4 cm of anterior displacement. The posterior wall of the acetabulum is intact. There is no dislocation.  There are mildly displaced acute fractures of the left superior and inferior pubic rami. Underlying posttraumatic deformities of the left pubic rami are noted.  The sacrum, sacroiliac joints and symphysis pubis are intact. No right-sided pelvic fractures are demonstrated.  Diffuse degenerative changes are present throughout the lower lumbar spine.  There is a moderate size left pelvic hematoma involving the iliacus muscle. There is mild subcutaneous edema throughout the pelvis. Multiple vascular calcifications in uterine fibroids are noted.  IMPRESSION: Moderately displaced and comminuted fracture of the left acetabulum as described. Associated mildly displaced fractures of the left superior and inferior pubic rami. The sacroiliac joints are intact.   Electronically Signed  By: Roxy Horseman M.D.   On: 07/23/2014 13:50   Ct 3d Recon At Scanner  07/23/2014   CLINICAL DATA:  Left acetabular fracture.  Preoperative planning.  EXAM: CT PELVIS WITHOUT CONTRAST, CT 3D RECON AT SCANNER  TECHNIQUE: Multidetector CT imaging of the pelvis was performed following the standard protocol without intravenous contrast.  3-dimensional CT images were rendered by post-processing of the original CT data at the CT scanner. The 3-dimensional CT images were interpreted, and findings were reported in the accompanying complete CT report for this study.  COMPARISON:  Left hip and pelvic radiographs 01/23/2014 and 07/22/2014.  FINDINGS: The bones are diffusely demineralized. The patient has undergone previous pinning of a subcapital fracture of the left femoral neck. This fracture appears healed with mild residual posttraumatic deformity. There is no evidence of acute proximal femur fracture or dislocation.  As demonstrated radiographically, there is an extensively comminuted and displaced fracture of the left acetabulum. This fracture involves the acetabular roof and anterior column, resulting in up to 3 cm of medial and 2.4 cm of anterior displacement. The posterior wall of the acetabulum is intact. There is no dislocation.  There are mildly displaced acute fractures of the left superior and inferior pubic rami. Underlying posttraumatic deformities of the left pubic rami are noted.  The sacrum, sacroiliac  joints and symphysis pubis are intact. No right-sided pelvic fractures are demonstrated. Diffuse degenerative changes are present throughout the lower lumbar spine.  There is a moderate size left pelvic hematoma involving the iliacus muscle. There is mild subcutaneous edema throughout the pelvis. Multiple vascular calcifications in uterine fibroids are noted.  IMPRESSION: Moderately displaced and comminuted fracture of the left acetabulum as described. Associated mildly displaced fractures of the left superior and inferior pubic rami. The sacroiliac joints are intact.   Electronically Signed   By: Roxy Horseman M.D.   On: 07/23/2014 13:50   Dg Chest Portable 1 View  08/20/2014   CLINICAL DATA:  Chest pain, shortness of breath  EXAM: PORTABLE CHEST - 1 VIEW  COMPARISON:  Prior radiograph from 07/22/2014  FINDINGS: Cardiomegaly is stable from prior exam. Atherosclerotic calcifications noted within the aortic arch.  Hazy bibasilar opacities with prominence of the interstitial markings present, most compatible with pulmonary edema. Superimposed infection is not entirely excluded. Probable left pleural effusion present. No pneumothorax.  No acute osseus abnormality.  IMPRESSION: 1. Cardiomegaly with findings suggestive of bibasilar pulmonary edema. Superimposed infection could be considered in the correct clinical setting. 2. Probable small left pleural effusion.   Electronically Signed   By: Rise Mu M.D.   On: 08/20/2014 04:35    ASSESSMENT AND PLAN  1. Chest pain  - Cath 08/20/2014 LVEDP 24 mmHg, 90% prox LCx stenosis treated with DES, mild to moderate diffuse disease in LAD  2. Acute on chronic diastolic HF  - with pulm edema  - elevated BNP and LVEDP on cath, managed by nephrology via HD  3. CAD s/p PCI 1998 x 2  - LHC 6/12 with calcified proximal LCx stenosis, not flow limiting by FFR  4. HTN 5. ESRD on HD  6. Uncontrolled DM 7. Severe Anemia: unclear cause  - prefer to stay on ASA  and Brilinta as pt just received a DES, further workup by primary team  - hgb down from 11 --> 7.2 this morning  Signed, Azalee Course PA-C Pager: [phone]  Personally seen and examined. Agree with above. Examined with no signs of bleeding. No BM. Groin normal.  No back pain. Discussed with Dr. Rhona Leavens. Will transfuse today one unit.  Discussed with Dr. Arrie Aran. Will have HD tomorrow and likely receive another unit.   Donato Schultz, MD

## 2014-08-22 NOTE — Assessment & Plan Note (Signed)
Controlled on multiple agents 

## 2014-08-22 NOTE — Progress Notes (Signed)
[facility] PROGRESS NOTE  Nicole [name] ZOX:[id number] DOB: [birth year] DOA: 08/20/2014 PCP: Loreen Freud, DO  Assessment/Plan: Acute pulmonary edema - Acute on chronic diastolic CHF  Much improved w/ HD - cont volume management per Nephrology  Chest pain  PCI 1998 x 2 - LHC 6/12 with calcified proximal LCx stenosis, not flow limiting by FFR - pt s/p cath 9/14 w/ DES to 90% proximal L circ stenosis, also noting mod to mod diffuse disease of L main and LAD - Cardiology following - on DAPT - ongoing care per Cardiology  Hyperosmolar nonketotic state in uncontrolled diabetes mellitus w/ renal complications  - HONK  - CBG noted to be hypoglycemic with glucose of <10, improved with hypoglycemic protocol -Started d5 at 50cc/hr -Will decrease levemir to 6 units and cont SSI   ESRD on hemodialysis on Monday Wednesday and Friday  As per Nephrology  Recent left acetabular fracture 8/15  Evaluated by Dr. Carola Frost during prior admit - bed to chair for another 4-5 weeks per original plan (slide or lift transfers only during the beginning phases, then progress to stand pivot transfers on her right leg)  - thereafter may begin some graduated weight bearing  L LE cutaneous wound  WOC RN consult for evaluation  Hypothyroidism  Cont home synthroid dose  Chronic macrocytic anemia  -Hgb is trending down - no obvious source of blood loss -Discussed with Cardiology and Nephrology - plan to transfuse 1 unit today -Follow CBC  PAD  Hyperlipidemia  -Cont lipitor   Code Status: Full Family Communication: Pt in room (indicate person spoken with, relationship, and if by phone, the number) Disposition Plan: Pending   Consultants:  Cardiology  Nephrology  Procedures:  08/20/14: Heart Cath  Antibiotics:  none (indicate start date, and stop date if known)  HPI/Subjective: Feels weak this AM. Also noted to be hypoglycemic.  Objective: Filed Vitals:   08/21/14 2030 08/22/14 0148 08/22/14  0556 08/22/14 0741  BP: 130/33 111/33 167/44 113/33  Pulse: 64 63 64 51  Temp: 98.7 F (37.1 C) 99.1 F (37.3 C) 97.6 F (36.4 C)   TempSrc: Oral Oral Oral   Resp: Height:      Weight:   46.4 kg (102 lb 4.7 oz)   SpO2: 97% 97% 95% 94%    Intake/Output Summary (Last 24 hours) at 08/22/14 1417 Last data filed at 08/22/14 0921  Gross per 24 hour  Intake    537 ml  Output      0 ml  Net    537 ml   Filed Weights   08/21/14 1034 08/21/14 1806 08/22/14 0556  Weight: 46.5 kg (102 lb 8.2 oz) 47.8 kg (105 lb 6.1 oz) 46.4 kg (102 lb 4.7 oz)    Exam:   General:  Awake, in nad  Cardiovascular: regular,s 1, s2  Respiratory: normal resp effort, no wheezing  Abdomen: soft,nondistended  Musculoskeletal: perfused, no clubbing   Data Reviewed: Basic Metabolic Panel:  Recent Labs Lab 08/20/14 0821 08/20/14 1300 08/20/14 1618 08/21/14 0248 08/22/14 1059  NA 130* 140 139 135* 132*  K 4.1 2.8* 3.6*  3.7 5.2 4.2  CL 88* 98 98 97 92*  CO2 GLUCOSE 630* 212* 71 130* 88  BUN 78* 31* 46* 48* 30*  CREATININE 5.04* 2.22* 3.44* 3.69* 3.21*  CALCIUM 9.0 8.8 9.2 8.1* 8.5  PHOS  --  1.3*  --   --   --  Liver Function Tests:  Recent Labs Lab 08/20/14 1300  ALBUMIN 2.8*   No results found for this basename: LIPASE, AMYLASE,  in the last 168 hours No results found for this basename: AMMONIA,  in the last 168 hours CBC:  Recent Labs Lab 08/20/14 0359 08/20/14 0821 08/20/14 1618 08/21/14 0248 08/22/14 0257  WBC 8.1 9.2 10.6* 8.8 6.9  NEUTROABS 7.3 7.9*  --   --   --   HGB 9.8* 8.8* 9.1* 7.9* 7.2*  HCT 30.7* 25.6* 26.0* 23.2* 21.6*  MCV 102.3* 96.2 94.2 95.9 98.2  PLT 197 208 200 179 166   Cardiac Enzymes:  Recent Labs Lab 08/20/14 0821 08/20/14 1618  TROPONINI 0.93* 1.22*   BNP (last 3 results)  Recent Labs  10/29/13 1719 08/20/14 0323  PROBNP 5412.0* 9463.0*   CBG:  Recent Labs Lab 08/22/14 0731 08/22/14 0834  08/22/14 0947 08/22/14 1036 08/22/14 1257  GLUCAP 83 70 79 123* 164*    Recent Results (from the past 240 hour(s))  MRSA PCR SCREENING     Status: None   Collection Time    08/20/14  8:00 AM      Result Value Ref Range Status   MRSA by PCR NEGATIVE  NEGATIVE Final   Comment:            The GeneXpert MRSA Assay (FDA     approved for NASAL specimens     only), is one component of a     comprehensive MRSA colonization     surveillance program. It is not     intended to diagnose MRSA     infection nor to guide or     monitor treatment for     MRSA infections.     Studies: No results found.  Scheduled Meds: . amLODipine  10 mg Oral Daily  . aspirin  81 mg Oral Daily  . atorvastatin  20 mg Oral q1800  . carvedilol  12.5 mg Oral BID WC  . cloNIDine  0.1 mg Oral Q2200  . darbepoetin (ARANESP) injection - DIALYSIS  25 mcg Intravenous Q Mon-HD  . dextrose      . docusate sodium  100 mg Oral BID  . famotidine  20 mg Oral Daily  . feeding supplement (RESOURCE BREEZE)  1 Container Oral Q24H  . glucagon (human recombinant)      . heparin subcutaneous  5,000 Units Subcutaneous 3 times per day  . hydrALAZINE  75 mg Oral 3 times per day  . insulin aspart  0-9 Units Subcutaneous TID WC  . insulin detemir  5 Units Subcutaneous QHS  . isosorbide mononitrate  20 mg Oral BID  . levothyroxine  88 mcg Oral QAC breakfast  . multivitamin  1 tablet Oral QHS  . senna-docusate  2 tablet Oral BID  . sevelamer carbonate  1,600 mg Oral TID WC  . ticagrelor  90 mg Oral BID   Continuous Infusions: . dextrose 50 mL/hr at 08/22/14 0930    Principal Problem:   Acute pulmonary edema Active Problems:   Chronic diastolic heart failure   End stage renal disease   Chest pain   Hyperosmolar non-ketotic state in patient with type 2 diabetes mellitus   Acute myocardial infarction of other lateral wall, initial episode of care  Time spent:  CHIU, STEPHEN K  [facility] Pager  [phone]. If 7PM-7AM, please contact night-coverage at [email or web address], password [facility] 08/22/2014, 2:17 PM  LOS: 2 days

## 2014-08-23 DIAGNOSIS — D638 Anemia in other chronic diseases classified elsewhere: Secondary | ICD-10-CM

## 2014-08-23 LAB — GLUCOSE, CAPILLARY
GLUCOSE-CAPILLARY: 422 mg/dL — AB (ref 70–99)
GLUCOSE-CAPILLARY: 349 mg/dL — AB (ref 70–99)
GLUCOSE-CAPILLARY: 159 mg/dL — AB (ref 70–99)
Glucose-Capillary: 457 mg/dL — ABNORMAL HIGH (ref 70–99)
Glucose-Capillary: 414 mg/dL — ABNORMAL HIGH (ref 70–99)
Glucose-Capillary: 224 mg/dL — ABNORMAL HIGH (ref 70–99)
Glucose-Capillary: 111 mg/dL — ABNORMAL HIGH (ref 70–99)
Glucose-Capillary: 271 mg/dL — ABNORMAL HIGH (ref 70–99)

## 2014-08-23 LAB — TYPE AND SCREEN
ABO/RH(D): A POS
ANTIBODY SCREEN: NEGATIVE
Unit division: 0

## 2014-08-23 LAB — RENAL FUNCTION PANEL
ALBUMIN: 2.4 g/dL — AB (ref 3.5–5.2)
ANION GAP: 18 — AB (ref 5–15)
BUN: 43 mg/dL — AB (ref 6–23)
CHLORIDE: 93 meq/L — AB (ref 96–112)
CO2: 22 mEq/L (ref 19–32)
Calcium: 8.2 mg/dL — ABNORMAL LOW (ref 8.4–10.5)
Creatinine, Ser: 4.07 mg/dL — ABNORMAL HIGH (ref 0.50–1.10)
GFR, EST AFRICAN AMERICAN: 11 mL/min — AB (ref >90)
GFR, EST NON AFRICAN AMERICAN: 10 mL/min — AB (ref >90)
Glucose, Bld: 243 mg/dL — ABNORMAL HIGH (ref 70–99)
POTASSIUM: 4 meq/L (ref 3.7–5.3)
Phosphorus: 3.7 mg/dL (ref 2.3–4.6)
Sodium: 133 mEq/L — ABNORMAL LOW (ref 137–147)

## 2014-08-23 LAB — CBC
HEMATOCRIT: 27.1 % — AB (ref 36.0–46.0)
Hemoglobin: 9.6 g/dL — ABNORMAL LOW (ref 12.0–15.0)
MCH: 33.6 pg (ref 26.0–34.0)
MCHC: 35.4 g/dL (ref 30.0–36.0)
MCV: 94.8 fL (ref 78.0–100.0)
PLATELETS: 178 10*3/uL (ref 150–400)
RBC: 2.86 MIL/uL — ABNORMAL LOW (ref 3.87–5.11)
RDW: 16.2 % — AB (ref 11.5–15.5)
WBC: 7.2 10*3/uL (ref 4.0–10.5)

## 2014-08-23 MED ORDER — INSULIN GLARGINE 100 UNIT/ML ~~LOC~~ SOLN
12.0000 [IU] | Freq: Every day | SUBCUTANEOUS | Status: DC
  Administered 2014-08-23 – 2014-08-25 (×3): 12 [IU] via SUBCUTANEOUS
  Filled 2014-08-23 (×3): qty 0.12

## 2014-08-23 NOTE — Progress Notes (Signed)
[facility] PROGRESS NOTE  Nicole [name]:[id number] DOB: 1935-11-15 DOA: 08/20/2014 PCP: Loreen Freud, DO  Assessment/Plan: Acute pulmonary edema - Acute on chronic diastolic CHF  -Increased O2 requirements after gentle IVF and blood transfusion -cont volume management per Nephrology  Chest pain  -PCI 1998 x 2 - LHC 6/12 with calcified proximal LCx stenosis, not flow limiting by FFR - pt s/p cath 9/14 w/ DES to 90% proximal L circ stenosis, also noting mod to mod diffuse disease of L main and LAD - Cardiology following - on DAPT - ongoing care per Cardiology  Hyperosmolar nonketotic state in uncontrolled diabetes mellitus w/ renal complications  - HONK  - CBG noted to be hypoglycemic with glucose of <10, improved with hypoglycemic protocol -Hypoglycemia improved, bs in the 400's -Will increase lantus to 12 units  ESRD on hemodialysis on Monday Wednesday and Friday  -As per Nephrology  Recent left acetabular fracture 8/15  -Evaluated by Dr. Carola Frost during prior admit - bed to chair for another 4-5 weeks per original plan (slide or lift transfers only during the beginning phases, then progress to stand pivot transfers on her right leg)  - thereafter may begin some graduated weight bearing  L LE cutaneous wound  -WOC RN was consulted for evaluation  Hypothyroidism  - Cont home synthroid dose  Chronic macrocytic anemia  -Hgb is trending down - no obvious source of blood loss -transfused 1 unit prbc yesterday -Follow CBC  PAD  Hyperlipidemia  -Cont lipitor  Code Status: Full Family Communication: Pt in room Disposition Plan: Pending   Consultants:  Cardiology  Nephrology  Procedures:  08/20/14: Heart Cath  Antibiotics:  none (indicate start date, and stop date if known)  HPI/Subjective: Feels better, still weak. No acute events noted overnight  Objective: Filed Vitals:   08/23/14 1221 08/23/14 1223 08/23/14 1259 08/23/14 1330  BP: 155/53 147/58 170/54  163/54  Pulse: 64  65 62  Temp: 97.5 F (36.4 C)     TempSrc: Oral     Resp: 20     Height:      Weight:  51.3 kg (113 lb 1.5 oz)    SpO2: 100% 100%      Intake/Output Summary (Last 24 hours) at 08/23/14 1427 Last data filed at 08/23/14 0828  Gross per 24 hour  Intake  672.5 ml  Output    200 ml  Net  472.5 ml   Filed Weights   08/22/14 0556 08/23/14 0419 08/23/14 1223  Weight: 46.4 kg (102 lb 4.7 oz) 50.8 kg (111 lb 15.9 oz) 51.3 kg (113 lb 1.5 oz)    Exam:   General:  Awake, in nad  Cardiovascular: regular,s 1, s2  Respiratory: normal resp effort, no wheezing  Abdomen: soft,nondistended  Musculoskeletal: perfused, no clubbing   Data Reviewed: Basic Metabolic Panel:  Recent Labs Lab 08/20/14 1300 08/20/14 1618 08/21/14 0248 08/22/14 1059 08/23/14 0950  NA 140 139 135* 132* 133*  K 2.8* 3.6*  3.7 5.2 4.2 4.0  CL 98 98 97 92* 93*  CO2 GLUCOSE 212* 71 130* 88 243*  BUN 31* 46* 48* 30* 43*  CREATININE 2.22* 3.44* 3.69* 3.21* 4.07*  CALCIUM 8.8 9.2 8.1* 8.5 8.2*  PHOS 1.3*  --   --   --  3.7   Liver Function Tests:  Recent Labs Lab 08/20/14 1300 08/23/14 0950  ALBUMIN 2.8* 2.4*   No results found for this basename: LIPASE, AMYLASE,  in the  last 168 hours No results found for this basename: AMMONIA,  in the last 168 hours CBC:  Recent Labs Lab 08/20/14 0359 08/20/14 0821 08/20/14 1618 08/21/14 0248 08/22/14 0257 08/23/14 0950  WBC 8.1 9.2 10.6* 8.8 6.9 7.2  NEUTROABS 7.3 7.9*  --   --   --   --   HGB 9.8* 8.8* 9.1* 7.9* 7.2* 9.6*  HCT 30.7* 25.6* 26.0* 23.2* 21.6* 27.1*  MCV 102.3* 96.2 94.2 95.9 98.2 94.8  PLT 197 208 200 179 166 178   Cardiac Enzymes:  Recent Labs Lab 08/20/14 0821 08/20/14 1618  TROPONINI 0.93* 1.22*   BNP (last 3 results)  Recent Labs  10/29/13 1719 08/20/14 0323  PROBNP 5412.0* 9463.0*   CBG:  Recent Labs Lab 08/23/14 0200 08/23/14 0509 08/23/14 0635 08/23/14 0748 08/23/14 1109   GLUCAP 457* 414* 422* 349* 224*    Recent Results (from the past 240 hour(s))  MRSA PCR SCREENING     Status: None   Collection Time    08/20/14  8:00 AM      Result Value Ref Range Status   MRSA by PCR NEGATIVE  NEGATIVE Final   Comment:            The GeneXpert MRSA Assay (FDA     approved for NASAL specimens     only), is one component of a     comprehensive MRSA colonization     surveillance program. It is not     intended to diagnose MRSA     infection nor to guide or     monitor treatment for     MRSA infections.     Studies: No results found.  Scheduled Meds: . amLODipine  10 mg Oral Daily  . aspirin  81 mg Oral Daily  . atorvastatin  20 mg Oral q1800  . carvedilol  12.5 mg Oral BID WC  . cloNIDine  0.1 mg Oral Q2200  . darbepoetin (ARANESP) injection - DIALYSIS  25 mcg Intravenous Q Mon-HD  . docusate sodium  100 mg Oral BID  . famotidine  20 mg Oral Daily  . feeding supplement (RESOURCE BREEZE)  1 Container Oral Q24H  . heparin subcutaneous  5,000 Units Subcutaneous 3 times per day  . hydrALAZINE  75 mg Oral 3 times per day  . insulin aspart  0-9 Units Subcutaneous TID WC  . insulin glargine  12 Units Subcutaneous Daily  . isosorbide mononitrate  20 mg Oral BID  . levothyroxine  88 mcg Oral QAC breakfast  . multivitamin  1 tablet Oral QHS  . senna-docusate  2 tablet Oral BID  . sevelamer carbonate  1,600 mg Oral TID WC  . ticagrelor  90 mg Oral BID   Continuous Infusions:    Principal Problem:   Acute pulmonary edema Active Problems:   Chronic diastolic heart failure   End stage renal disease   Chest pain   Hyperosmolar non-ketotic state in patient with type 2 diabetes mellitus   Acute myocardial infarction of other lateral wall, initial episode of care  Time spent:  CHIU, STEPHEN K  [facility] Pager [phone]. If 7PM-7AM, please contact night-coverage at [email or web address], password [facility] 08/23/2014, 2:27 PM  LOS: 3 days

## 2014-08-23 NOTE — Progress Notes (Addendum)
Inpatient Diabetes Program Recommendations  AACE/ADA: New Consensus Statement on Inpatient Glycemic Control (2013)  Target Ranges:  Prepandial:   less than 140 mg/dL      Peak postprandial:   less than 180 mg/dL (1-2 hours)      Critically ill patients:  140 - 180 mg/dL   This coordinator called Nellie RN to alert that patient received Levemir 5 units last evening and Lantus 12 units this afternoon.  Patient had severe hypoglycemic event 08/22/14 0620 CBG <10 with Lantus dose 12 units.  Recommended to Amanda Pea RN to monitor CBGs frequently and treat any hypoglycemic events per HYPOglycemia protocol.   Recommend reducing Lantus to10 units and may need low dose Novolog prandial dose TID.   Will follow. Thank you  Piedad Climes BSN, RN,CDE Inpatient Diabetes Coordinator [phone] (team pager)

## 2014-08-23 NOTE — Procedures (Signed)
I was present at this session.  I have reviewed the session itself and made appropriate changes.  BP ^  To get 3.5 L off. tol well.  Lamoine Fredricksen L 9/17/20153:13 PM

## 2014-08-23 NOTE — Progress Notes (Signed)
Patient ID: Nicole [name], female   DOB: 1935-02-27, 79 y.o.   MRN: [id number]  [facility] Progress Note    Subjective:   Had some SOB this am.   Objective:   BP 142/48  Pulse 77  Temp(Src) 98.3 F (36.8 C) (Oral)  Resp 18  Ht  (1.626 m)  Wt 50.8 kg (111 lb 15.9 oz)  BMI 19.21 kg/m2  SpO2 97%  Intake/Output: I/O last 3 completed shifts: In: 774.5 [P.O.:762; Blood:12.5] Out: 200 [Urine:200]   Intake/Output this shift:  Total I/O In: 360 [P.O.:360] Out: -  Weight change: 1.2 kg (2 lb 10.3 oz)  Physical Exam: WUJ:WJXBJ elderly WF in NAD CVS:no rub Resp:dry crackles at bases YNW:GNFAOZ Ext:no edema, L AVF +T/B  Labs: BMET  Recent Labs Lab 08/20/14 0359 08/20/14 0821 08/20/14 1300 08/20/14 1618 08/21/14 0248 08/22/14 1059 08/23/14 0950  NA 124* 130* 140 139 135* 132* 133*  K 6.1* 4.1 2.8* 3.6*  3.7 5.2 4.2 4.0  CL 84* 88* 98 98 97 92* 93*  CO2 GLUCOSE 1027* 630* 212* 71 130* 88 243*  BUN 75* 78* 31* 46* 48* 30* 43*  CREATININE 4.76* 5.04* 2.22* 3.44* 3.69* 3.21* 4.07*  ALBUMIN  --   --  2.8*  --   --   --  2.4*  CALCIUM 8.7 9.0 8.8 9.2 8.1* 8.5 8.2*  PHOS  --   --  1.3*  --   --   --  3.7   CBC  Recent Labs Lab 08/20/14 0359 08/20/14 0821 08/20/14 1618 08/21/14 0248 08/22/14 0257 08/23/14 0950  WBC 8.1 9.2 10.6* 8.8 6.9 7.2  NEUTROABS 7.3 7.9*  --   --   --   --   HGB 9.8* 8.8* 9.1* 7.9* 7.2* 9.6*  HCT 30.7* 25.6* 26.0* 23.2* 21.6* 27.1*  MCV 102.3* 96.2 94.2 95.9 98.2 94.8  PLT 197 208 200 179 166 178    @ Medications:    . amLODipine  10 mg Oral Daily  . aspirin  81 mg Oral Daily  . atorvastatin  20 mg Oral q1800  . carvedilol  12.5 mg Oral BID WC  . cloNIDine  0.1 mg Oral Q2200  . darbepoetin (ARANESP) injection - DIALYSIS  25 mcg Intravenous Q Mon-HD  . docusate sodium  100 mg Oral BID  . famotidine  20 mg Oral Daily  . feeding supplement (RESOURCE BREEZE)  1 Container  Oral Q24H  . heparin subcutaneous  5,000 Units Subcutaneous 3 times per day  . hydrALAZINE  75 mg Oral 3 times per day  . insulin aspart  0-9 Units Subcutaneous TID WC  . insulin glargine  12 Units Subcutaneous Daily  . isosorbide mononitrate  20 mg Oral BID  . levothyroxine  88 mcg Oral QAC breakfast  . multivitamin  1 tablet Oral QHS  . senna-docusate  2 tablet Oral BID  . sevelamer carbonate  1,600 mg Oral TID WC  . ticagrelor  90 mg Oral BID     Dialysis Orders: MWF @ [facility]  46.5 kg 2K/2.25ca 3hr46min 2000 Heparin 350/1.5 L AVF  Aranesp 25 q week  Assessment/Plan:  1. CAD s/p drug-eluting stent in L Cx- No recurrent CP.  2. Hypoglycemia- glucose <10 this am given D50 and repeat 133, still drowsy/feels bad. Recheck RFP. Was hyperglycemic on admission, adjustments to insulin per primary 3. ESRD - MWF [facility]. Off schedule, will cont with TTS for now and  get back on schedule Monday.  4. Hypertension/volume - At goal with reaching her edw. Norvasc, coreg, clonidine, hydralazine  5. Anemia - hgb up from 7.4 to 9.6 after 1 unit of PRBC's. Aranesp 25, given weekly 6. Metabolic bone disease - Ca+8.2 PTH 163 7. Nutrition - NPO. Renal vit 8. L hip fracture- follow up xray 9/30- nonsurgical management 9.   Emillio Ngo A 08/23/2014, 11:49 AM

## 2014-08-23 NOTE — Progress Notes (Signed)
Patient Name: Nicole [name] Date of Encounter: 08/23/2014     Principal Problem:   Acute pulmonary edema Active Problems:   Chronic diastolic heart failure   End stage renal disease   Chest pain   Hyperosmolar non-ketotic state in patient with type 2 diabetes mellitus   Acute myocardial infarction of other lateral wall, initial episode of care    SUBJECTIVE  Denies any chest discomfort, SOB is improving. Still weak, but better than yesterday  CURRENT MEDS . amLODipine  10 mg Oral Daily  . aspirin  81 mg Oral Daily  . atorvastatin  20 mg Oral q1800  . carvedilol  12.5 mg Oral BID WC  . cloNIDine  0.1 mg Oral Q2200  . darbepoetin (ARANESP) injection - DIALYSIS  25 mcg Intravenous Q Mon-HD  . docusate sodium  100 mg Oral BID  . famotidine  20 mg Oral Daily  . feeding supplement (RESOURCE BREEZE)  1 Container Oral Q24H  . heparin subcutaneous  5,000 Units Subcutaneous 3 times per day  . hydrALAZINE  75 mg Oral 3 times per day  . insulin aspart  0-9 Units Subcutaneous TID WC  . insulin glargine  12 Units Subcutaneous Daily  . isosorbide mononitrate  20 mg Oral BID  . levothyroxine  88 mcg Oral QAC breakfast  . multivitamin  1 tablet Oral QHS  . senna-docusate  2 tablet Oral BID  . sevelamer carbonate  1,600 mg Oral TID WC  . ticagrelor  90 mg Oral BID    OBJECTIVE  Filed Vitals:   08/22/14 2145 08/23/14 0151 08/23/14 0419 08/23/14 1052  BP: 156/45 160/35 153/41 142/48  Pulse: 72 71 70 77  Temp: 98.9 F (37.2 C) 98.1 F (36.7 C) 98.3 F (36.8 C)   TempSrc: Oral Oral Oral   Resp: Height:      Weight:   111 lb 15.9 oz (50.8 kg)   SpO2: 96% 97% 97%     Intake/Output Summary (Last 24 hours) at 08/23/14 1116 Last data filed at 08/23/14 0828  Gross per 24 hour  Intake  672.5 ml  Output    200 ml  Net  472.5 ml   Filed Weights   08/21/14 1806 08/22/14 0556 08/23/14 0419  Weight: 105 lb 6.1 oz (47.8 kg) 102 lb 4.7 oz (46.4 kg) 111 lb 15.9 oz  (50.8 kg)    PHYSICAL EXAM  General: Pleasant, NAD. Neuro: Alert and oriented X 3. Moves all extremities spontaneously. Psych: Normal affect. HEENT:  Normal  Neck: Supple without bruits or JVD. Lungs:  Resp regular and unlabored, mild decreased breath sound in R basis, otherwise lung is moving air well Heart: RRR no s3, s4. 2/6 systolic murmurs Abdomen: Soft, non-tender, non-distended, BS + x 4.  Extremities: No clubbing, cyanosis or edema. DP/PT/Radials 2+ and equal bilaterally. LLE wound dressing in place, LUE AVF noted  Accessory Clinical Findings  CBC  Recent Labs  08/22/14 0257 08/23/14 0950  WBC 6.9 7.2  HGB 7.2* 9.6*  HCT 21.6* 27.1*  MCV 98.2 94.8  PLT 166 178   Basic Metabolic Panel  Recent Labs  08/20/14 1300  08/22/14 1059 08/23/14 0950  NA 140  < > 132* 133*  K 2.8*  < > 4.2 4.0  CL 98  < > 92* 93*  CO2 25  < > 25 22  GLUCOSE 212*  < > 88 243*  BUN 31*  < > 30* 43*  CREATININE 2.22*  < >  3.21* 4.07*  CALCIUM 8.8  < > 8.5 8.2*  PHOS 1.3*  --   --  3.7  < > = values in this interval not displayed. Liver Function Tests  Recent Labs  08/20/14 1300 08/23/14 0950  ALBUMIN 2.8* 2.4*   Cardiac Enzymes  Recent Labs  08/20/14 1618  TROPONINI 1.22*    TELE NSR with HR 60s, no significant ventricular ectopy    ECG  No new EKG  Echocardiogram  08/20/2014 LV EF: 50% - 55%  ------------------------------------------------------------------- Study Conclusions  - Left ventricle: The cavity size was normal. Wall thickness was increased in a pattern of mild LVH. Systolic function was normal. The estimated ejection fraction was in the range of 50% to 55%. - Mitral valve: There was mild regurgitation. - Left atrium: The atrium was moderately dilated.      Radiology/Studies  Dg Chest Portable 1 View  08/20/2014   CLINICAL DATA:  Chest pain, shortness of breath  EXAM: PORTABLE CHEST - 1 VIEW  COMPARISON:  Prior radiograph from 07/22/2014   FINDINGS: Cardiomegaly is stable from prior exam. Atherosclerotic calcifications noted within the aortic arch.  Hazy bibasilar opacities with prominence of the interstitial markings present, most compatible with pulmonary edema. Superimposed infection is not entirely excluded. Probable left pleural effusion present. No pneumothorax.  No acute osseus abnormality.  IMPRESSION: 1. Cardiomegaly with findings suggestive of bibasilar pulmonary edema. Superimposed infection could be considered in the correct clinical setting. 2. Probable small left pleural effusion.   Electronically Signed   By: Rise Mu M.D.   On: 08/20/2014 04:35    ASSESSMENT AND PLAN  1. Chest pain  - Cath 08/20/2014 LVEDP 24 mmHg, 90% prox LCx stenosis treated with DES, mild to moderate diffuse disease in LAD  - stable from cardiology perspective  2. Acute on chronic diastolic HF  - with pulm edema  - elevated BNP and LVEDP on cath, managed by nephrology via HD  - pending dialysis  3. CAD s/p PCI 1998 x 2  - LHC 6/12 with calcified proximal LCx stenosis, not flow limiting by FFR   4. HTN  5. ESRD on HD  6. Uncontrolled DM  7. Severe Anemia:  - s/p transfusion, hgb 7.9 --> 7.2 --> 9.6   Ramond Dial PA-C Pager: [phone]  Personally seen and examined. Agree with above. Improved Hg with transfusion. HD today Donato Schultz, MD

## 2014-08-24 ENCOUNTER — Other Ambulatory Visit: Payer: Self-pay | Admitting: Physician Assistant

## 2014-08-24 DIAGNOSIS — I2510 Atherosclerotic heart disease of native coronary artery without angina pectoris: Secondary | ICD-10-CM

## 2014-08-24 DIAGNOSIS — E1142 Type 2 diabetes mellitus with diabetic polyneuropathy: Secondary | ICD-10-CM

## 2014-08-24 DIAGNOSIS — E1149 Type 2 diabetes mellitus with other diabetic neurological complication: Secondary | ICD-10-CM

## 2014-08-24 DIAGNOSIS — D649 Anemia, unspecified: Secondary | ICD-10-CM

## 2014-08-24 LAB — GLUCOSE, CAPILLARY
GLUCOSE-CAPILLARY: 205 mg/dL — AB (ref 70–99)
Glucose-Capillary: 211 mg/dL — ABNORMAL HIGH (ref 70–99)
Glucose-Capillary: 231 mg/dL — ABNORMAL HIGH (ref 70–99)
Glucose-Capillary: 165 mg/dL — ABNORMAL HIGH (ref 70–99)

## 2014-08-24 LAB — CBC
HCT: 26.4 % — ABNORMAL LOW (ref 36.0–46.0)
Hemoglobin: 9.1 g/dL — ABNORMAL LOW (ref 12.0–15.0)
MCH: 33 pg (ref 26.0–34.0)
MCHC: 34.5 g/dL (ref 30.0–36.0)
MCV: 95.7 fL (ref 78.0–100.0)
Platelets: 171 10*3/uL (ref 150–400)
RBC: 2.76 MIL/uL — ABNORMAL LOW (ref 3.87–5.11)
RDW: 16.5 % — AB (ref 11.5–15.5)
WBC: 6.1 10*3/uL (ref 4.0–10.5)

## 2014-08-24 MED ORDER — LOPERAMIDE HCL 2 MG PO CAPS
2.0000 mg | ORAL_CAPSULE | ORAL | Status: DC | PRN
  Administered 2014-08-24 (×2): 2 mg via ORAL
  Filled 2014-08-24 (×3): qty 1

## 2014-08-24 NOTE — Plan of Care (Signed)
Problem: Phase III Progression Outcomes Goal: Up to chair & ambulate with assist (TID) Outcome: Not Applicable Date Met:  85/46/27 Patient is not to bear weight on legs for few weeks per doctor's note and is to only slide or use lift to get in chair Goal: VS Stable with increased activity Outcome: Completed/Met Date Met:  08/24/14 Patient has sat on side of bed

## 2014-08-24 NOTE — Progress Notes (Signed)
Pt n/a for ambulation. Discussed MI, stent, Brilinta and NTG with pt. Voiced understanding and sts she needs NTG added to her med list (she couldn't get any at [facility] because it wasn't on her list). CR goals met and will sign off. [id number] Yves Dill CES, ACSM 2:07 PM 08/24/2014

## 2014-08-24 NOTE — Progress Notes (Addendum)
Patient Name: Nicole [name] Date of Encounter: 08/24/2014  Primary cardiologist: Dr. Delton See   Principal Problem:   Acute pulmonary edema Active Problems:   Chronic diastolic heart failure   End stage renal disease   Chest pain   Hyperosmolar non-ketotic state in patient with type 2 diabetes mellitus   Acute myocardial infarction of other lateral wall, initial episode of care    SUBJECTIVE  Denies any CP or SOB. Continue to get stronger  CURRENT MEDS . amLODipine  10 mg Oral Daily  . aspirin  81 mg Oral Daily  . atorvastatin  20 mg Oral q1800  . carvedilol  12.5 mg Oral BID WC  . cloNIDine  0.1 mg Oral Q2200  . darbepoetin (ARANESP) injection - DIALYSIS  25 mcg Intravenous Q Mon-HD  . docusate sodium  100 mg Oral BID  . famotidine  20 mg Oral Daily  . feeding supplement (RESOURCE BREEZE)  1 Container Oral Q24H  . heparin subcutaneous  5,000 Units Subcutaneous 3 times per day  . hydrALAZINE  75 mg Oral 3 times per day  . insulin aspart  0-9 Units Subcutaneous TID WC  . insulin glargine  12 Units Subcutaneous Daily  . isosorbide mononitrate  20 mg Oral BID  . levothyroxine  88 mcg Oral QAC breakfast  . multivitamin  1 tablet Oral QHS  . senna-docusate  2 tablet Oral BID  . sevelamer carbonate  1,600 mg Oral TID WC  . ticagrelor  90 mg Oral BID    OBJECTIVE  Filed Vitals:   08/24/14 0036 08/24/14 0609 08/24/14 0800 08/24/14 1020  BP: 168/48 150/41 167/41   Pulse:  65 63   Temp:  98.4 F (36.9 C) 98.3 F (36.8 C)   TempSrc:  Oral Oral   Resp:  20 18   Height:      Weight:  104 lb 8 oz (47.4 kg)    SpO2:  100% 100% 93%    Intake/Output Summary (Last 24 hours) at 08/24/14 1023 Last data filed at 08/23/14 1640  Gross per 24 hour  Intake      0 ml  Output   3500 ml  Net  -3500 ml   Filed Weights   08/23/14 0419 08/23/14 1223 08/24/14 0609  Weight: 111 lb 15.9 oz (50.8 kg) 113 lb 1.5 oz (51.3 kg) 104 lb 8 oz (47.4 kg)    PHYSICAL EXAM  General:  Pleasant, NAD. Neuro: Alert and oriented X 3. Moves all extremities spontaneously. Psych: Normal affect. HEENT:  Normal  Neck: Supple without bruits or JVD. Lungs:  Resp regular and unlabored, CTA. Heart: RRR no s3, s4, or murmurs. Abdomen: Soft, non-tender, non-distended, BS + x 4.  Extremities: No clubbing, cyanosis or edema. DP/PT/Radials 2+ and equal bilaterally.  Accessory Clinical Findings  CBC  Recent Labs  08/23/14 0950 08/24/14 0525  WBC 7.2 6.1  HGB 9.6* 9.1*  HCT 27.1* 26.4*  MCV 94.8 95.7  PLT 178 171   Basic Metabolic Panel  Recent Labs  08/22/14 1059 08/23/14 0950  NA 132* 133*  K 4.2 4.0  CL 92* 93*  CO2 25 22  GLUCOSE 88 243*  BUN 30* 43*  CREATININE 3.21* 4.07*  CALCIUM 8.5 8.2*  PHOS  --  3.7   Liver Function Tests  Recent Labs  08/23/14 0950  ALBUMIN 2.4*    TELE NSR with HR 60-70s    ECG  No new EKG  Echocardiogram  LV EF: 50% - 55%  -------------------------------------------------------------------  Study Conclusions  - Left ventricle: The cavity size was normal. Wall thickness was increased in a pattern of mild LVH. Systolic function was normal. The estimated ejection fraction was in the range of 50% to 55%. - Mitral valve: There was mild regurgitation. - Left atrium: The atrium was moderately dilated.      Radiology/Studies  Dg Chest Portable 1 View  08/20/2014   CLINICAL DATA:  Chest pain, shortness of breath  EXAM: PORTABLE CHEST - 1 VIEW  COMPARISON:  Prior radiograph from 07/22/2014  FINDINGS: Cardiomegaly is stable from prior exam. Atherosclerotic calcifications noted within the aortic arch.  Hazy bibasilar opacities with prominence of the interstitial markings present, most compatible with pulmonary edema. Superimposed infection is not entirely excluded. Probable left pleural effusion present. No pneumothorax.  No acute osseus abnormality.  IMPRESSION: 1. Cardiomegaly with findings suggestive of bibasilar  pulmonary edema. Superimposed infection could be considered in the correct clinical setting. 2. Probable small left pleural effusion.   Electronically Signed   By: Rise Mu M.D.   On: 08/20/2014 04:35    ASSESSMENT AND PLAN  1. Chest pain    - Cath 08/20/2014 LVEDP 24 mmHg, 90% prox LCx stenosis treated with DES, mild to moderate diffuse disease in LAD    - stable from cardiology perspective, HTN need better control, can potentially increase her coreg from 12.5 to 25 mg BID or increase her imdur dose.  - will defer to Dr. Anne Fu whether to sign off, will schedule follow up with Dr. Delton See  2. Acute on chronic diastolic HF   - with pulm edema   - elevated BNP and LVEDP on cath, managed by nephrology via HD   - dialyzed yesterday, pending further dialysis tomorrow  3. CAD s/p PCI 1998 x 2    - LHC 6/12 with calcified proximal LCx stenosis, not flow limiting by FFR  -Cath 08/20/2014 LVEDP 24 mmHg, 90% prox LCx stenosis treated with DES, mild to moderate diffuse disease in LAD    - Needs one year dual antiplatelet therapy (ASA and Brilinta)  4. HTN   5. ESRD on HD   6. Uncontrolled DM   7. Severe Anemia:    - s/p transfusion, hgb 7.9 --> 7.2 --> 9.6 --> 9.2  - stable at this time, will obtained repeat CBC on follow up    Signed, Amedeo Plenty Pager: [phone]  Personally seen and examined. Agree with above. Will continue with current antihypertensive mgt for now given HD status (potential for hypotension during dialysis).   Interestingly, Dr. Riley Kill a few years ago tried to find source of bleeding but could not, she states.   I would be fine with transitioning to Plavix in one month (from [place]).    Will sign off. Please call if questions.

## 2014-08-24 NOTE — Evaluation (Signed)
Physical Therapy Evaluation Patient Details Name: Nicole [name] MRN: [id number] DOB: 09-May-1935 Today's Date: 08/24/2014   History of Present Illness  Patient admitted from Skilled facility with SOB and chest pain.  Found to have MI.  Previous admission s/p post fall resulting in complex L acetabular fx; non-operative management; strict NWB LLE; Per Ortho-Trauma: Should the patient bear weight on her left leg this may lead to catastrophic disruption of her fracture fragments.  Clinical Impression  Patient presents with decreased independence with mobility due to deficits in PT problem list.  She will benefit from skilled PT in the acute setting to allow return home following SNF rehab stay.  Will follow acutely.  Tolerated edge of bed activity today and began activities to prep for lateral scoot transfers.    Follow Up Recommendations SNF    Equipment Recommendations  Wheelchair (measurements PT);Wheelchair cushion (measurements PT)    Recommendations for Other Services       Precautions / Restrictions Precautions Precautions: Fall Restrictions Weight Bearing Restrictions: Yes LLE Weight Bearing: Non weight bearing      Mobility  Bed Mobility Overal bed mobility: Needs Assistance Bed Mobility: Supine to Sit;Sit to Supine     Supine to sit: Mod assist;HOB elevated Sit to supine: Mod assist   General bed mobility comments: assist to scoot hips over and pt used rail to lift trunk; to supine assist for both feet in bed, then RN assisted to scoot pt up in bed  Transfers                 General transfer comment: NT  Ambulation/Gait                Stairs            Wheelchair Mobility    Modified Rankin (Stroke Patients Only)       Balance Overall balance assessment: Needs assistance   Sitting balance-Leahy Scale: Poor Sitting balance - Comments: UE support for balance in sitting       Standing balance comment: NT                             Pertinent Vitals/Pain Pain Assessment: 0-10 Pain Score: 3  Pain Location: left foot > right Pain Descriptors / Indicators: Aching Pain Intervention(s): Monitored during session    Home Living Family/patient expects to be discharged to:: Skilled nursing facility                      Prior Function                 Hand Dominance   Dominant Hand: Right    Extremity/Trunk Assessment   Upper Extremity Assessment: Generalized weakness (numbness in middle 3 fingers both hands)           Lower Extremity Assessment: LLE deficits/detail;RLE deficits/detail RLE Deficits / Details: AROM WFL except ankle DF about 10 degrees from neutral, strength grossly 4-/5 LLE Deficits / Details: AAROM hip flexion about 70, ankle DF limited about 20 degrees from neutral  Cervical / Trunk Assessment: Kyphotic  Communication   Communication: No difficulties  Cognition Arousal/Alertness: Awake/alert Behavior During Therapy: WFL for tasks assessed/performed Overall Cognitive Status: Within Functional Limits for tasks assessed                      General Comments      Exercises Total Joint Exercises Ankle  Circles/Pumps: AROM;10 reps;Both;Supine;AAROM Short Arc Quad: AROM;Both;10 reps;Supine Heel Slides: AAROM;AROM;Both;10 reps;Supine Other Exercises Other Exercises: forward leans with UE assist and right LE weight bearing min assist x 5      Assessment/Plan    PT Assessment Patient needs continued PT services  PT Diagnosis Acute pain;Generalized weakness   PT Problem List Decreased strength;Decreased mobility;Decreased balance;Decreased knowledge of precautions;Decreased activity tolerance;Decreased range of motion;Pain;Impaired sensation  PT Treatment Interventions DME instruction;Gait training;Therapeutic exercise;Balance training;Functional mobility training;Therapeutic activities;Patient/family education;Neuromuscular re-education;Wheelchair  mobility training   PT Goals (Current goals can be found in the Care Plan section) Acute Rehab PT Goals Patient Stated Goal: To return to independent PT Goal Formulation: With patient Time For Goal Achievement: 09/07/14 Potential to Achieve Goals: Good    Frequency Min 2X/week   Barriers to discharge        Co-evaluation               End of Session   Activity Tolerance: Patient limited by fatigue Patient left: in bed;with call bell/phone within reach;with nursing/sitter in room           Time: 1125-1155 PT Time Calculation (min): 30 min   Charges:   PT Evaluation $Initial PT Evaluation Tier I: 1 Procedure PT Treatments $Therapeutic Activity: 8-22 mins   PT G Codes:          WYNN,CYNDI 28-Aug-2014, 12:46 PM Sheran Lawless, PT [phone] Aug 28, 2014

## 2014-08-24 NOTE — Progress Notes (Signed)
Inpatient Diabetes Program Recommendations  AACE/ADA: New Consensus Statement on Inpatient Glycemic Control (2013)  Target Ranges:  Prepandial:   less than 140 mg/dL      Peak postprandial:   less than 180 mg/dL (1-2 hours)      Critically ill patients:  140 - 180 mg/dL   Inpatient Diabetes Program Recommendations Insulin - Meal Coverage: consider adding Novolog 2 units TID with meals  Thank you  Piedad Climes BSN, RN,CDE Inpatient Diabetes Coordinator [phone] (team pager)

## 2014-08-24 NOTE — Progress Notes (Signed)
Patient ID: Nicole [name], female   DOB: [birth year], 79 y.o.   MRN: [id number]  [facility] Progress Note    Subjective:   Feels tired   Objective:   BP 167/41  Pulse 63  Temp(Src) 98.3 F (36.8 C) (Oral)  Resp 18  Ht  (1.626 m)  Wt 47.4 kg (104 lb 8 oz)  BMI 17.93 kg/m2  SpO2 100%  Intake/Output: I/O last 3 completed shifts: In: 660 [P.O.:660] Out: 3700 [Urine:200; Other:3500]   Intake/Output this shift:    Weight change: 0.5 kg (1 lb 1.6 oz)  Physical Exam: NWG:NFAOZ elderly WM in NAD CVS:no rub Resp:cta HYQ:MVHQIO Ext:no edema, LUE AVF +T/B  Labs: BMET  Recent Labs Lab 08/20/14 0359 08/20/14 0821 08/20/14 1300 08/20/14 1618 08/21/14 0248 08/22/14 1059 08/23/14 0950  NA 124* 130* 140 139 135* 132* 133*  K 6.1* 4.1 2.8* 3.6*  3.7 5.2 4.2 4.0  CL 84* 88* 98 98 97 92* 93*  CO2 GLUCOSE 1027* 630* 212* 71 130* 88 243*  BUN 75* 78* 31* 46* 48* 30* 43*  CREATININE 4.76* 5.04* 2.22* 3.44* 3.69* 3.21* 4.07*  ALBUMIN  --   --  2.8*  --   --   --  2.4*  CALCIUM 8.7 9.0 8.8 9.2 8.1* 8.5 8.2*  PHOS  --   --  1.3*  --   --   --  3.7   CBC  Recent Labs Lab 08/20/14 0359 08/20/14 0821  08/21/14 0248 08/22/14 0257 08/23/14 0950 08/24/14 0525  WBC 8.1 9.2  < > 8.8 6.9 7.2 6.1  NEUTROABS 7.3 7.9*  --   --   --   --   --   HGB 9.8* 8.8*  < > 7.9* 7.2* 9.6* 9.1*  HCT 30.7* 25.6*  < > 23.2* 21.6* 27.1* 26.4*  MCV 102.3* 96.2  < > 95.9 98.2 94.8 95.7  PLT 197 208  < > 179 166 178 171  < > = values in this interval not displayed.  @ Medications:    . amLODipine  10 mg Oral Daily  . aspirin  81 mg Oral Daily  . atorvastatin  20 mg Oral q1800  . carvedilol  12.5 mg Oral BID WC  . cloNIDine  0.1 mg Oral Q2200  . darbepoetin (ARANESP) injection - DIALYSIS  25 mcg Intravenous Q Mon-HD  . docusate sodium  100 mg Oral BID  . famotidine  20 mg Oral Daily  . feeding supplement (RESOURCE BREEZE)  1  Container Oral Q24H  . heparin subcutaneous  5,000 Units Subcutaneous 3 times per day  . hydrALAZINE  75 mg Oral 3 times per day  . insulin aspart  0-9 Units Subcutaneous TID WC  . insulin glargine  12 Units Subcutaneous Daily  . isosorbide mononitrate  20 mg Oral BID  . levothyroxine  88 mcg Oral QAC breakfast  . multivitamin  1 tablet Oral QHS  . senna-docusate  2 tablet Oral BID  . sevelamer carbonate  1,600 mg Oral TID WC  . ticagrelor  90 mg Oral BID    Dialysis Orders: MWF @ [facility]  46.5 kg 2K/2.25ca 3hr13min 2000 Heparin 350/1.5 L AVF  Aranesp 25 q week   Assessment/Plan:  1. CAD s/p drug-eluting stent in L Cx- No recurrent CP. Appreciate cardiology's assistance. 2. Hypoglycemia- glucose <10 yesterday and given D50 and repeat 133. Had adjustments to insulin per primary and starting to trend up  again.  Cont to follow. 3. ESRD - MWF AF. Off schedule, will cont with TTS for now and get back on schedule Monday 08/27/14.  4. Hypertension/volume - At goal with reaching her edw. Norvasc, coreg, clonidine, hydralazine  5. Anemia - hgb up from 7.4 to 9.6 after 1 unit of PRBC's. Aranesp 25, given weekly, cont to follow. Drop slightly to 9.1 if cont to drop will transfuse 1 unit with HD tomorrow.  Unclear why Hgb dropped (no evidence of hematoma per cardiology) 6. Metabolic bone disease - Ca+8.2 PTH 163 7. Nutrition - NPO. Renal vit 8. L hip fracture- follow up xray 9/30- nonsurgical management 9. Dispo- for transfer back to [facility] when stable per primary svc.  Jeneva Schweizer A 08/24/2014, 9:04 AM

## 2014-08-24 NOTE — Progress Notes (Signed)
No change in d/c plan per MD. Possible d/c tomorrow back to [facility]. Contacted Nicki at [facility] and she is agreeable to this plan. Can accept on either Saturday or Sunday.  Will notify weekend CSW to follow up on this. Fl2 signed. Will require EMS transport.  Message left for patient's son Rocky Link re: above.  Lorri Frederick. Jaci Lazier, [place]  [phone]

## 2014-08-24 NOTE — Progress Notes (Signed)
[facility] PROGRESS NOTE  Nicole [name] ZOX:[id number] DOB: [birth year] DOA: 08/20/2014 PCP: Loreen Freud, DO  Assessment/Plan: Acute pulmonary edema - Acute on chronic diastolic CHF  -Had noted increased O2 requirements after gentle IVF and blood transfusion on 9/17 -Resolved with HD and on minimal O2 support -cont volume management per Nephrology  Chest pain  -PCI 1998 x 2 - LHC 6/12 with calcified proximal LCx stenosis, not flow limiting by FFR - pt s/p cath 9/14 w/ DES to 90% proximal L circ stenosis, also noting mod to mod diffuse disease of L main and LAD - Cardiology following - on DAPT - ongoing care per Cardiology  Hyperosmolar nonketotic state in uncontrolled diabetes mellitus w/ renal complications  - HONK  - CBG noted to be hypoglycemic with glucose of <10, improved with hypoglycemic protocol -Hypoglycemia resolved, but BS suboptimal, staying in the 200's with Lantus resumed at 12units -Cont SSI ESRD on hemodialysis on Monday Wednesday and Friday  -As per Nephrology - still on TTS schedule, plan to return to MWF on 9/21 Recent left acetabular fracture 8/15  -Evaluated by Dr. Carola Frost during prior admit - bed to chair for another 4-5 weeks per original plan (slide or lift transfers only during the beginning phases, then progress to stand pivot transfers on her right leg)  - thereafter may begin some graduated weight bearing  L LE cutaneous wound  -WOC RN was consulted for evaluation  Hypothyroidism  - Cont home synthroid dose  Chronic macrocytic anemia  -Hgb is trending down - no obvious source of blood loss -transfused 1 unit prbc 9/17 -Follow CBC  PAD  Hyperlipidemia  -Cont lipitor  Code Status: Full Family Communication: Pt in room Disposition Plan: Pending return to [facility]   Consultants:  Cardiology  Nephrology  Procedures:  08/20/14: Heart Cath  Antibiotics:  none (indicate start date, and stop date if known)  HPI/Subjective: Reports  feeling better today. No acute events noted overnight  Objective: Filed Vitals:   08/24/14 0800 08/24/14 1020 08/24/14 1051 08/24/14 1427  BP: 167/41   130/40  Pulse: 63   63  Temp: 98.3 F (36.8 C)   98.3 F (36.8 C)  TempSrc: Oral   Oral  Resp: 18   16  Height:      Weight:      SpO2: 100% 93% 95% 99%    Intake/Output Summary (Last 24 hours) at 08/24/14 1648 Last data filed at 08/24/14 1346  Gross per 24 hour  Intake    779 ml  Output      0 ml  Net    779 ml   Filed Weights   08/23/14 0419 08/23/14 1223 08/24/14 0609  Weight: 50.8 kg (111 lb 15.9 oz) 51.3 kg (113 lb 1.5 oz) 47.4 kg (104 lb 8 oz)    Exam:   General:  Awake, in nad  Cardiovascular: regular,s 1, s2  Respiratory: normal resp effort, no wheezing  Abdomen: soft,nondistended  Musculoskeletal: perfused, no clubbing   Data Reviewed: Basic Metabolic Panel:  Recent Labs Lab 08/20/14 1300 08/20/14 1618 08/21/14 0248 08/22/14 1059 08/23/14 0950  NA 140 139 135* 132* 133*  K 2.8* 3.6*  3.7 5.2 4.2 4.0  CL 98 98 97 92* 93*  CO2 GLUCOSE 212* 71 130* 88 243*  BUN 31* 46* 48* 30* 43*  CREATININE 2.22* 3.44* 3.69* 3.21* 4.07*  CALCIUM 8.8 9.2 8.1* 8.5 8.2*  PHOS 1.3*  --   --   --  3.7   Liver Function Tests:  Recent Labs Lab 08/20/14 1300 08/23/14 0950  ALBUMIN 2.8* 2.4*   No results found for this basename: LIPASE, AMYLASE,  in the last 168 hours No results found for this basename: AMMONIA,  in the last 168 hours CBC:  Recent Labs Lab 08/20/14 0359 08/20/14 0821 08/20/14 1618 08/21/14 0248 08/22/14 0257 08/23/14 0950 08/24/14 0525  WBC 8.1 9.2 10.6* 8.8 6.9 7.2 6.1  NEUTROABS 7.3 7.9*  --   --   --   --   --   HGB 9.8* 8.8* 9.1* 7.9* 7.2* 9.6* 9.1*  HCT 30.7* 25.6* 26.0* 23.2* 21.6* 27.1* 26.4*  MCV 102.3* 96.2 94.2 95.9 98.2 94.8 95.7  PLT 197 208 200 179 166 178 171   Cardiac Enzymes:  Recent Labs Lab 08/20/14 0821 08/20/14 1618  TROPONINI 0.93*  1.22*   BNP (last 3 results)  Recent Labs  10/29/13 1719 08/20/14 0323  PROBNP 5412.0* 9463.0*   CBG:  Recent Labs Lab 08/23/14 1742 08/23/14 2116 08/24/14 0617 08/24/14 1200 08/24/14 1609  GLUCAP 159* 271* 211* 231* 205*    Recent Results (from the past 240 hour(s))  MRSA PCR SCREENING     Status: None   Collection Time    08/20/14  8:00 AM      Result Value Ref Range Status   MRSA by PCR NEGATIVE  NEGATIVE Final   Comment:            The GeneXpert MRSA Assay (FDA     approved for NASAL specimens     only), is one component of a     comprehensive MRSA colonization     surveillance program. It is not     intended to diagnose MRSA     infection nor to guide or     monitor treatment for     MRSA infections.     Studies: No results found.  Scheduled Meds: . amLODipine  10 mg Oral Daily  . aspirin  81 mg Oral Daily  . atorvastatin  20 mg Oral q1800  . carvedilol  12.5 mg Oral BID WC  . cloNIDine  0.1 mg Oral Q2200  . darbepoetin (ARANESP) injection - DIALYSIS  25 mcg Intravenous Q Mon-HD  . docusate sodium  100 mg Oral BID  . famotidine  20 mg Oral Daily  . feeding supplement (RESOURCE BREEZE)  1 Container Oral Q24H  . heparin subcutaneous  5,000 Units Subcutaneous 3 times per day  . hydrALAZINE  75 mg Oral 3 times per day  . insulin aspart  0-9 Units Subcutaneous TID WC  . insulin glargine  12 Units Subcutaneous Daily  . isosorbide mononitrate  20 mg Oral BID  . levothyroxine  88 mcg Oral QAC breakfast  . multivitamin  1 tablet Oral QHS  . senna-docusate  2 tablet Oral BID  . sevelamer carbonate  1,600 mg Oral TID WC  . ticagrelor  90 mg Oral BID   Continuous Infusions:    Principal Problem:   Acute pulmonary edema Active Problems:   Chronic diastolic heart failure   End stage renal disease   Chest pain   Hyperosmolar non-ketotic state in patient with type 2 diabetes mellitus   Acute myocardial infarction of other lateral wall, initial episode of  care  Time spent:  CHIU, STEPHEN K  [facility] Pager [phone]. If 7PM-7AM, please contact night-coverage at [email or web address], password [facility] 08/24/2014, 4:48 PM  LOS: 4 days

## 2014-08-25 LAB — RENAL FUNCTION PANEL
ALBUMIN: 2.2 g/dL — AB (ref 3.5–5.2)
Anion gap: 14 (ref 5–15)
BUN: 29 mg/dL — ABNORMAL HIGH (ref 6–23)
CALCIUM: 8.1 mg/dL — AB (ref 8.4–10.5)
CO2: 23 mEq/L (ref 19–32)
Chloride: 99 mEq/L (ref 96–112)
Creatinine, Ser: 3.77 mg/dL — ABNORMAL HIGH (ref 0.50–1.10)
GFR calc non Af Amer: 11 mL/min — ABNORMAL LOW (ref >90)
GFR, EST AFRICAN AMERICAN: 12 mL/min — AB (ref >90)
Glucose, Bld: 177 mg/dL — ABNORMAL HIGH (ref 70–99)
PHOSPHORUS: 2.6 mg/dL (ref 2.3–4.6)
Potassium: 4 mEq/L (ref 3.7–5.3)
Sodium: 136 mEq/L — ABNORMAL LOW (ref 137–147)

## 2014-08-25 LAB — GLUCOSE, CAPILLARY
GLUCOSE-CAPILLARY: 167 mg/dL — AB (ref 70–99)
Glucose-Capillary: 191 mg/dL — ABNORMAL HIGH (ref 70–99)

## 2014-08-25 LAB — CBC
HCT: 26.4 % — ABNORMAL LOW (ref 36.0–46.0)
Hemoglobin: 9.1 g/dL — ABNORMAL LOW (ref 12.0–15.0)
MCH: 33.2 pg (ref 26.0–34.0)
MCHC: 34.5 g/dL (ref 30.0–36.0)
MCV: 96.4 fL (ref 78.0–100.0)
Platelets: 178 10*3/uL (ref 150–400)
RBC: 2.74 MIL/uL — AB (ref 3.87–5.11)
RDW: 16 % — ABNORMAL HIGH (ref 11.5–15.5)
WBC: 5.7 10*3/uL (ref 4.0–10.5)

## 2014-08-25 MED ORDER — TICAGRELOR 90 MG PO TABS: 90.0000 mg | ORAL_TABLET | Freq: Two times a day (BID) | ORAL | Status: AC

## 2014-08-25 MED ORDER — INSULIN GLARGINE 100 UNIT/ML ~~LOC~~ SOLN: 12.0000 [IU] | Freq: Every day | SUBCUTANEOUS | Status: DC

## 2014-08-25 NOTE — Significant Event (Addendum)
Transported via ambulance to [facility] with belongings VSS A&OX4 NO DISTRESS NOTED . REPORT CALLED [facility].

## 2014-08-25 NOTE — Clinical Social Work Note (Signed)
CSW made aware patient ready for discharge by RN Otila Kluver. CSW contacted facility and spoke with [facility] who confirmed bed availability. CSW met with patient who is agreeable to d/c plan. CSW contacted patient's son Yvone Neu) and left a message. CSW prepared d/c packet and placed in patient's shadow chart. CSW provided RN with number for report. CSW to arrange transportation via [place]. No further needs. CSW signing off.  Cast[place], [place] Weekend Clinical Social Worker [phone]

## 2014-08-25 NOTE — Procedures (Signed)
Patient was seen on dialysis and the procedure was supervised. BFR 400 Via LUE AVF BP is 165/49.  Patient appears to be tolerating treatment well

## 2014-08-25 NOTE — Progress Notes (Addendum)
Subjective:  Seen on dialysis, no complaints, breathing well, no chest discomfort  Objective: Vital signs in last 24 hours: Temp:  [97.6 F (36.4 C)-98.3 F (36.8 C)] 98.2 F (36.8 C) (09/19 0855) Pulse Rate:  [60-66] 60 (09/19 1030) Resp:  [12-18] 15 (09/19 1030) BP: (127-160)/(40-47) 144/47 mmHg (09/19 1030) SpO2:  [97 %-100 %] 97 % (09/19 0855) Weight:  [46.8 kg (103 lb 2.8 oz)-49.6 kg (109 lb 5.6 oz)] 49.6 kg (109 lb 5.6 oz) (09/19 0855) Weight change: -4.5 kg (-9 lb 14.7 oz)  Intake/Output from previous day: 09/18 0701 - 09/19 0700 In: 1259 [P.O.:1259] Out: -  Intake/Output this shift:   Lab Results:  Recent Labs  08/24/14 0525 08/25/14 0350  WBC 6.1 5.7  HGB 9.1* 9.1*  HCT 26.4* 26.4*  PLT 171 178   BMET:  Recent Labs  08/23/14 0950 08/25/14 0350  NA 133* 136*  K 4.0 4.0  CL 93* 99  CO2 22 23  GLUCOSE 243* 177*  BUN 43* 29*  CREATININE 4.07* 3.77*  CALCIUM 8.2* 8.1*  ALBUMIN 2.4* 2.2*   No results found for this basename: PTH,  in the last 72 hours Iron Studies: No results found for this basename: IRON, TIBC, TRANSFERRIN, FERRITIN,  in the last 72 hours  Studies/Results: No results found.  EXAM: General appearance:  Alert, in no apparent distress Resp:  CTA without rales, rhonchi, or wheezes Cardio:  RRR without murmur or rub GI:  + BS, soft and nontender Extremities: No edema Access: AVF @ LUA with BFR 350 cc/min  Dialysis Orders: MWF @ [facility]  46.5 kg 2K/2.25ca 3hr66min 2000 Heparin 350/1.5 L AVF  Aranesp 25 q week   Assessment/Plan: 1. Acute MI - s/p drug-eluting stent in L Cx 9/14, no recurrent CP.  2. Hypoglycemia - adjustments to insulin per primary, most recently BG 191.  3. ESRD - HD on MWF @ [facility], currently off schedule, but next HD on Mon.  4. HTN/volume - BP 144/47 on Norvasc, Coreg, Clonidine, Hydralazine; wt 49.6 kg with UF goal 2L.  5. Anemia - Hgb up from 7.4 to 9.6 after 1 unit of PRBCs 9/16, Aranesp 25 mcg on Mon.  6. Metabolic  bone disease - Ca 8.1 (9.5 corrected), P 2.6, PTH 163; Renvela 2 with meals.  7. Nutrition - Alb 2.2, carb-mod diet, multivitamin.  8. L hip fracture - follow-up x-ray 9/30, nonsurgical management.  9. Dispo - for transfer back to [facility] when stable per primary svc.     LOS: 5 days   LYLES,CHARLES 08/25/2014,10:58 AM    I have seen and examined this patient and agree with plan as outlined by C. Lyles, PA-C.  Will get back on MWF schedule after transfer to [facility] rehab. Zalaya Astarita A,MD 08/25/2014 1:34 PM

## 2014-08-25 NOTE — Discharge Summary (Signed)
Physician Discharge Summary  Nicole [name] DOB: 02/24/1935 DOA: 08/20/2014  PCP: Nicole Freud, DO  Admit date: 08/20/2014 Discharge date: 08/25/2014  Time spent: 35 minutes  Recommendations for Outpatient Follow-up:  1. Follow up with PCP in 1-2 weeks  2. Wound care dressing recs: Vaseline dressing to left posterior leg to promote moist healing. Foam dressing to left heel and left foot to promote healing. Float left heel to reduce pressure.  3. Cardiology recommendations to transition to Plavix in one month (from [facility]).    Discharge Diagnoses:  Principal Problem:   Acute pulmonary edema Active Problems:   Chronic diastolic heart failure   End stage renal disease   Chest pain   Hyperosmolar non-ketotic state in patient with type 2 diabetes mellitus   Acute myocardial infarction of other lateral wall, initial episode of care   Discharge Condition: Improved  Diet recommendation: Renal, diabetic  Filed Weights   08/24/14 0609 08/25/14 0436 08/25/14 0855  Weight: 47.4 kg (104 lb 8 oz) 46.8 kg (103 lb 2.8 oz) 49.6 kg (109 lb 5.6 oz)    History of present illness:  Please see admit h and p from 9/14 for details. Briefly, pt presents with intermittent chest pressure with sob after a recent admission for L acetabular fracture. CXR was notable for pulm edema. The patient was noted to have blood sugars of over 1000. The patient was admitted for further work up.  Hospital Course:  Acute pulmonary edema - Acute on chronic diastolic CHF  -Resolved with HD -Had noted increased O2 requirements after gentle IVF and blood transfusion on 9/17 (see below) -Again resolved with HD and now on minimal O2 support  -cont volume management per Nephrology  Chest pain  -PCI 1998 x 2 - LHC 6/12 with calcified proximal LCx stenosis, not flow limiting by FFR - pt s/p cath 9/14 w/ DES to 90% proximal L circ stenosis, also noting mod to mod diffuse disease of L main and LAD -  Cardiology following - on DAPT - ongoing care per Cardiology  Hyperosmolar nonketotic state in uncontrolled diabetes mellitus w/ renal complications  - HONK on admission -glucose initially improved with insulin gtt - CBG noted to be hypoglycemic with glucose of <10 on 9/16 after transitioning from insulin gtt, improved with hypoglycemic protocol  -Hypoglycemia resolved, but BS suboptimal, staying in the 200's with Lantus resumed at 12units  -Cont SSI -Would resume re-meal coverage as per home regimen on d/c ESRD on hemodialysis on Monday Wednesday and Friday  -As per Nephrology - still on TTS schedule, plan to transition back to MWF schedule on 9/21  Recent left acetabular fracture 8/15  -Evaluated by Dr. Carola [name] during prior admit - bed to chair for another 4-5 weeks per original plan (slide or lift transfers only during the beginning phases, then progress to stand pivot transfers on her right leg)  - thereafter may begin some graduated weight bearing  L LE cutaneous wound  -WOC RN was consulted for evaluation -See above for dressing change recommendations per wound care Hypothyroidism  - Continued home synthroid dose  Chronic macrocytic anemia  -Hgb noted to be trending down - no obvious source of blood loss  -Anemia was apparently noted in the outpatient setting as well and was worked up -transfused 1 unit prbc 9/17 and another unit on HD the following day with good correction in Hgb -Hgb appears to be holding steady at 9.1 on 9/19 PAD  Hyperlipidemia  -Cont lipitor  Consultations:  Cardiology  Nephrology  Discharge Exam: Filed Vitals:   08/25/14 1200 08/25/14 1230 08/25/14 1300 08/25/14 1319  BP: 158/47 167/54 163/45 165/49  Pulse: 61 62 58 61  Temp:    98.5 F (36.9 C)  TempSrc:    Oral  Resp: Height:      Weight:      SpO2:    99%    General: awake, in nad Cardiovascular: regular, s1, s2 Respiratory: normal resp effort, no wheezing  Discharge  Instructions     Medication List    STOP taking these medications       insulin detemir 100 UNIT/ML injection  Commonly known as:  LEVEMIR      TAKE these medications       acetaminophen 650 MG CR tablet  Commonly known as:  TYLENOL  Take 650 mg by mouth every 8 (eight) hours as needed for pain.     amLODipine 10 MG tablet  Commonly known as:  NORVASC  Take 1 tablet (10 mg total) by mouth daily.     aspirin EC 81 MG tablet  Take 81 mg by mouth daily.     atorvastatin 20 MG tablet  Commonly known as:  LIPITOR  Take 1 tablet (20 mg total) by mouth daily at 6 PM.     carvedilol 12.5 MG tablet  Commonly known as:  COREG  Take 1 tablet (12.5 mg total) by mouth 2 (two) times daily with a meal.     cloNIDine 0.1 MG tablet  Commonly known as:  CATAPRES  Take 0.1 mg by mouth daily at 10 pm.     DSS 100 MG Caps  Take 100 mg by mouth 2 (two) times daily.     famotidine 20 MG tablet  Commonly known as:  PEPCID  Take 20 mg by mouth daily.     HEPARIN SODIUM (BOVINE) IJ  Inject 5,000 Units as directed 3 (three) times daily.     hydrALAZINE 50 MG tablet  Commonly known as:  APRESOLINE  Take 50 mg by mouth every 6 (six) hours.     HYDROcodone-acetaminophen 5-325 MG per tablet  Commonly known as:  NORCO/VICODIN  Take 1-2 tablets by mouth every 6 (six) hours as needed for moderate pain.     insulin aspart 100 UNIT/ML injection  Commonly known as:  novoLOG  - Inject 0-9 Units into the skin 3 (three) times daily with meals. CBG < 70: implement hypoglycemia protocol  - CBG 70 - 120: 0 units  - CBG 121 - 150: 1 unit  - CBG 151 - 200: 2 units  - CBG 201 - 250: 3 units  - CBG 251 - 300: 5 units  - CBG 301 - 350: 7 units  - CBG 351 - 400: 9 units  - CBG > 400: call MD.     insulin aspart 100 UNIT/ML injection  Commonly known as:  novoLOG  Inject 5 Units into the skin 2 (two) times daily. Before breakfast and before lunch     insulin glargine 100 UNIT/ML injection   Commonly known as:  LANTUS  Inject 0.12 mLs (12 Units total) into the skin daily.     isosorbide mononitrate 20 MG tablet  Commonly known as:  ISMO,MONOKET  Take 1 tablet (20 mg total) by mouth 2 (two) times daily.     levothyroxine 88 MCG tablet  Commonly known as:  SYNTHROID, LEVOTHROID  Take 88 mcg by mouth daily before breakfast.  ondansetron 4 MG tablet  Commonly known as:  ZOFRAN  Take 4 mg by mouth every 6 (six) hours as needed for nausea or vomiting.     senna-docusate 8.6-50 MG per tablet  Commonly known as:  Senokot-S  Take 2 tablets by mouth 2 (two) times daily.     sevelamer 800 MG tablet  Commonly known as:  RENAGEL  Take 1,600 mg by mouth 3 (three) times daily with meals.     ticagrelor 90 MG Tabs tablet  Commonly known as:  BRILINTA  Take 1 tablet (90 mg total) by mouth 2 (two) times daily.     Vitamin D 1000 UNITS capsule  Take 1,000 Units by mouth daily.       Allergies  Allergen Reactions  . Ibuprofen     REACTION: edema---thyroid problems  . Prednisone     Causes severe fluid retention  . Procrit [Epoetin Alfa]     Per Dr. Eliane Decree note: she apparently has had an "adverse reaction" to Procrit after which she gets diffuse arthralgias and myalgias.   Follow-up Information   Follow up with Tereso Newcomer, PA-C On 09/13/2014. (9:45am)    Specialty:  Physician Assistant   Contact information:   [address] [place] [address] [phone]       Follow up with [facility] On 08/31/2014. (follow up CBC in clinic on 9/25 as long as lab open)    Specialty:  Cardiology   Contact information:   [address] [place] [address] [phone]       The results of significant diagnostics from this hospitalization (including imaging, microbiology, ancillary and laboratory) are listed below for reference.    Significant Diagnostic Studies: Dg Chest Portable 1 View  08/20/2014   CLINICAL DATA:   Chest pain, shortness of breath  EXAM: PORTABLE CHEST - 1 VIEW  COMPARISON:  Prior radiograph from 07/22/2014  FINDINGS: Cardiomegaly is stable from prior exam. Atherosclerotic calcifications noted within the aortic arch.  Hazy bibasilar opacities with prominence of the interstitial markings present, most compatible with pulmonary edema. Superimposed infection is not entirely excluded. Probable left pleural effusion present. No pneumothorax.  No acute osseus abnormality.  IMPRESSION: 1. Cardiomegaly with findings suggestive of bibasilar pulmonary edema. Superimposed infection could be considered in the correct clinical setting. 2. Probable small left pleural effusion.   Electronically Signed   By: Rise Mu M.D.   On: 08/20/2014 04:35    Microbiology: Recent Results (from the past 240 hour(s))  MRSA PCR SCREENING     Status: None   Collection Time    08/20/14  8:00 AM      Result Value Ref Range Status   MRSA by PCR NEGATIVE  NEGATIVE Final   Comment:            The GeneXpert MRSA Assay (FDA     approved for NASAL specimens     only), is one component of a     comprehensive MRSA colonization     surveillance program. It is not     intended to diagnose MRSA     infection nor to guide or     monitor treatment for     MRSA infections.     Labs: Basic Metabolic Panel:  Recent Labs Lab 08/20/14 1300 08/20/14 1618 08/21/14 0248 08/22/14 1059 08/23/14 0950 08/25/14 0350  NA 140 139 135* 132* 133* 136*  K 2.8* 3.6*  3.7 5.2 4.2 4.0  4.0  CL 98 98 97 92* 93* 99  CO2 GLUCOSE 212* 71 130* 88 243* 177*  BUN 31* 46* 48* 30* 43* 29*  CREATININE 2.22* 3.44* 3.69* 3.21* 4.07* 3.77*  CALCIUM 8.8 9.2 8.1* 8.5 8.2* 8.1*  PHOS 1.3*  --   --   --  3.7 2.6   Liver Function Tests:  Recent Labs Lab 08/20/14 1300 08/23/14 0950 08/25/14 0350  ALBUMIN 2.8* 2.4* 2.2*   No results found for this basename: LIPASE, AMYLASE,  in the last 168 hours No results found  for this basename: AMMONIA,  in the last 168 hours CBC:  Recent Labs Lab 08/20/14 0359 08/20/14 0821  08/21/14 0248 08/22/14 0257 08/23/14 0950 08/24/14 0525 08/25/14 0350  WBC 8.1 9.2  < > 8.8 6.9 7.2 6.1 5.7  NEUTROABS 7.3 7.9*  --   --   --   --   --   --   HGB 9.8* 8.8*  < > 7.9* 7.2* 9.6* 9.1* 9.1*  HCT 30.7* 25.6*  < > 23.2* 21.6* 27.1* 26.4* 26.4*  MCV 102.3* 96.2  < > 95.9 98.2 94.8 95.7 96.4  PLT 197 208  < > 179 166 178 171 178  < > = values in this interval not displayed. Cardiac Enzymes:  Recent Labs Lab 08/20/14 0821 08/20/14 1618  TROPONINI 0.93* 1.22*   BNP: BNP (last 3 results)  Recent Labs  10/29/13 1719 08/20/14 0323  PROBNP 5412.0* 9463.0*   CBG:  Recent Labs Lab 08/24/14 0617 08/24/14 1200 08/24/14 1609 08/24/14 2114 08/25/14 0746  GLUCAP 211* 231* 205* 165* 191*    Signed:  Fenna Semel K  [facility] 08/25/2014, 1:48 PM

## 2014-08-27 ENCOUNTER — Other Ambulatory Visit: Payer: Self-pay | Admitting: *Deleted

## 2014-08-27 ENCOUNTER — Telehealth: Payer: Self-pay

## 2014-08-27 MED ORDER — HYDROCODONE-ACETAMINOPHEN 5-325 MG PO TABS: 1.0000 | ORAL_TABLET | Freq: Four times a day (QID) | ORAL | Status: DC | PRN

## 2014-08-27 NOTE — Telephone Encounter (Signed)
Admit date: 08/20/2014  Discharge date: 08/25/2014  Reason for admission: Acute pulmonary edema  Called to schedule hospital follow up.  No answer.  Left message for call back.

## 2014-08-27 NOTE — Telephone Encounter (Signed)
[facility] 

## 2014-08-29 ENCOUNTER — Encounter: Payer: Self-pay | Admitting: Internal Medicine

## 2014-08-29 ENCOUNTER — Non-Acute Institutional Stay (SKILLED_NURSING_FACILITY): Payer: Medicare Other | Admitting: Internal Medicine

## 2014-08-29 DIAGNOSIS — E1101 Type 2 diabetes mellitus with hyperosmolarity with coma: Secondary | ICD-10-CM

## 2014-08-29 DIAGNOSIS — E1100 Type 2 diabetes mellitus with hyperosmolarity without nonketotic hyperglycemic-hyperosmolar coma (NKHHC): Secondary | ICD-10-CM

## 2014-08-29 DIAGNOSIS — J810 Acute pulmonary edema: Secondary | ICD-10-CM

## 2014-08-29 DIAGNOSIS — S32402D Unspecified fracture of left acetabulum, subsequent encounter for fracture with routine healing: Secondary | ICD-10-CM

## 2014-08-29 DIAGNOSIS — L89522 Pressure ulcer of left ankle, stage 2: Secondary | ICD-10-CM

## 2014-08-29 DIAGNOSIS — L89509 Pressure ulcer of unspecified ankle, unspecified stage: Secondary | ICD-10-CM

## 2014-08-29 DIAGNOSIS — E0789 Other specified disorders of thyroid: Secondary | ICD-10-CM

## 2014-08-29 DIAGNOSIS — N186 End stage renal disease: Secondary | ICD-10-CM

## 2014-08-29 DIAGNOSIS — D638 Anemia in other chronic diseases classified elsewhere: Secondary | ICD-10-CM

## 2014-08-29 DIAGNOSIS — E038 Other specified hypothyroidism: Secondary | ICD-10-CM

## 2014-08-29 DIAGNOSIS — E7800 Pure hypercholesterolemia, unspecified: Secondary | ICD-10-CM

## 2014-08-29 DIAGNOSIS — S72009D Fracture of unspecified part of neck of unspecified femur, subsequent encounter for closed fracture with routine healing: Secondary | ICD-10-CM

## 2014-08-29 DIAGNOSIS — E034 Atrophy of thyroid (acquired): Secondary | ICD-10-CM

## 2014-08-29 DIAGNOSIS — L8992 Pressure ulcer of unspecified site, stage 2: Secondary | ICD-10-CM

## 2014-08-29 DIAGNOSIS — I2510 Atherosclerotic heart disease of native coronary artery without angina pectoris: Secondary | ICD-10-CM

## 2014-08-29 NOTE — Progress Notes (Signed)
MRN: [id number] Name: Nicole [name]  Sex: female Age: 80 y.o. DOB: [birth year]  [facility] #: [facility] Facility/Room: 111 Level Of Care: SNF Provider: Merrilee Seashore D Emergency Contacts: Extended Emergency Contact Information Primary Emergency Contact: Feimster,Ken Address: [address]          [place], [place] [address] [place] Home Phone: [phone] Relation: Son Secondary Emergency Contact: Alean Rinne [place] of [place] Home Phone: [phone] Relation: Son  Code Status: FULL  Allergies: Ibuprofen; Prednisone; and Procrit  Chief Complaint  Patient presents with  . nursing home admission    HPI: Patient is 79 y.o. female who is admitted to SNF after hospitalization for acute pulmonary edema, resolved with HD and hyperosmolar nonketotic state from type 2 DM tx with IVF followed by HD.  Past Medical History  Diagnosis Date  . Retinopathy   . Hypertension   . Chronic diastolic CHF (congestive heart failure)     has a normal EF per echo 03/2011 & 04/2012 with diastolic dysfunction  . CAD (coronary artery disease)     Prior PCI in 1998 x 2; s/p cath in 2001, negative Myoview in 2008 & 2011, s/p cath in June 2012 with calcified disease of the proximal LCX with normal flow reserve. She has been managed medically due to her other morbidities  . Cerebrovascular disease   . Peripheral vascular disease     prior angiography showing RSFA stenosis, left anterior tibial and left posterior tibial stenoses  . Hypercholesterolemia   . Diabetes mellitus     insulin dependent  . Hypothyroidism   . Hiatal hernia   . Degenerative joint disease   . Osteoporosis   . Vitamin D deficiency   . Anemia of chronic disease   . ESRD (end stage renal disease)     a. CKD progressed to ESRD 10/2013. b. s/p LUE basilic transposition by vascular on 11/08/13.  . Dialysis patient     [facility], and Friday  . Hip fracture, left   . Acute myocardial infarction of other  lateral wall, initial episode of care     Past Surgical History  Procedure Laterality Date  . Abdominal aortogram  07/27/2003    by Dr. Chales Abrahams  . Bilateral lower extremity angiogram  07/27/2003    by Dr. Chales Abrahams  . Selective angiography,right superficial femoral artery, right and left iliac arteries  07/27/2003    by Dr. Chales Abrahams  . Measurement of gradient right and left iliac arteries  07/27/2003    by Dr. Chales Abrahams  . Bilateral cataract sugery  2000 and 2002    by Dr. Cecilie Kicks  . Repair left hip fracture  04/2008    by Dr. Charlann Boxer  . Bascilic vein transposition Left 11/08/2013    Procedure: BASCILIC VEIN TRANSPOSITION;  Surgeon: Pryor Ochoa, MD;  Location: [facility] OR;  Service: Vascular;  Laterality: Left;  Marland Kitchen Eye surgery      Vision loss in Right Eye      Medication List       This list is accurate as of: 08/29/14 11:59 PM.  Always use your most recent med list.               acetaminophen 650 MG CR tablet  Commonly known as:  TYLENOL  Take 650 mg by mouth every 8 (eight) hours as needed for pain.     amLODipine 10 MG tablet  Commonly known as:  NORVASC  Take 1 tablet (10 mg total) by mouth daily.  aspirin EC 81 MG tablet  Take 81 mg by mouth daily.     atorvastatin 20 MG tablet  Commonly known as:  LIPITOR  Take 1 tablet (20 mg total) by mouth daily at 6 PM.     carvedilol 12.5 MG tablet  Commonly known as:  COREG  Take 1 tablet (12.5 mg total) by mouth 2 (two) times daily with a meal.     cloNIDine 0.1 MG tablet  Commonly known as:  CATAPRES  Take 0.1 mg by mouth daily at 10 pm.     DSS 100 MG Caps  Take 100 mg by mouth 2 (two) times daily.     famotidine 20 MG tablet  Commonly known as:  PEPCID  Take 20 mg by mouth daily.     HEPARIN SODIUM (BOVINE) IJ  Inject 5,000 Units as directed 3 (three) times daily.     hydrALAZINE 50 MG tablet  Commonly known as:  APRESOLINE  Take 50 mg by mouth every 6 (six) hours.     HYDROcodone-acetaminophen 5-325 MG per tablet   Commonly known as:  NORCO/VICODIN  Take 1-2 tablets by mouth every 6 (six) hours as needed for moderate pain.     insulin aspart 100 UNIT/ML injection  Commonly known as:  novoLOG  - Inject 0-9 Units into the skin 3 (three) times daily with meals. CBG < 70: implement hypoglycemia protocol  - CBG 70 - 120: 0 units  - CBG 121 - 150: 1 unit  - CBG 151 - 200: 2 units  - CBG 201 - 250: 3 units  - CBG 251 - 300: 5 units  - CBG 301 - 350: 7 units  - CBG 351 - 400: 9 units  - CBG > 400: call MD.     insulin aspart 100 UNIT/ML injection  Commonly known as:  novoLOG  Inject 5 Units into the skin 2 (two) times daily. Before breakfast and before lunch     insulin glargine 100 UNIT/ML injection  Commonly known as:  LANTUS  Inject 0.12 mLs (12 Units total) into the skin daily.     isosorbide mononitrate 20 MG tablet  Commonly known as:  ISMO,MONOKET  Take 1 tablet (20 mg total) by mouth 2 (two) times daily.     levothyroxine 88 MCG tablet  Commonly known as:  SYNTHROID, LEVOTHROID  Take 88 mcg by mouth daily before breakfast.     ondansetron 4 MG tablet  Commonly known as:  ZOFRAN  Take 4 mg by mouth every 6 (six) hours as needed for nausea or vomiting.     senna-docusate 8.6-50 MG per tablet  Commonly known as:  Senokot-S  Take 2 tablets by mouth 2 (two) times daily.     sevelamer 800 MG tablet  Commonly known as:  RENAGEL  Take 1,600 mg by mouth 3 (three) times daily with meals.     ticagrelor 90 MG Tabs tablet  Commonly known as:  BRILINTA  Take 1 tablet (90 mg total) by mouth 2 (two) times daily.     Vitamin D 1000 UNITS capsule  Take 1,000 Units by mouth daily.        No orders of the defined types were placed in this encounter.    Immunization History  Administered Date(s) Administered  . Influenza Split 09/01/2011, 08/25/2012  . Influenza Whole 09/12/2008, 09/03/2009, 08/26/2010  . Influenza,inj,Quad PF,36+ Mos 08/16/2013, 08/21/2014  . Pneumococcal  Polysaccharide-23 04/23/2009    History  Substance Use Topics  .  Smoking status: Former Smoker    Types: Cigarettes    Quit date: 12/07/1974  . Smokeless tobacco: Never Used     Comment: pt started smoking in 1956  . Alcohol Use: No    Family history is noncontributory    Review of Systems   GENERAL: no fevers, +fatigue, appetite changes SKIN: No itching, rash EYES: No eye pain, redness, discharge EARS: No earache, tinnitus, change in hearing NOSE: No congestion, drainage or bleeding  MOUTH/THROAT: No mouth or tooth pain, No sore throat  RESPIRATORY: No cough, wheezing, SOB CARDIAC: No chest pain, palpitations, lower extremity edema  GI: No abdominal pain, No N/V/D or constipation, No heartburn or reflux  GU: No dysuria, frequency or urgency, or incontinence  MUSCULOSKELETAL: No unrelieved bone/joint pain NEUROLOGIC: No headache, dizziness or focal weakness PSYCHIATRIC: No overt anxiety or sadness. Sleeps well. No behavior issue.   Filed Vitals:   08/29/14 1737  BP: 124/60  Pulse: 62  Temp: 97.2 F (36.2 C)  Resp: 18    Physical Exam  GENERAL APPEARANCE: Alert, conversant. Appropriately groomed. No acute distress.  SKIN: No diaphoresis rash; LLE dresed HEAD: Normocephalic, atraumatic  EYES: Conjunctiva/lids clear. Pupils round, reactive. EOMs intact.  EARS: External exam WNL, canals clear. Hearing grossly normal.  NOSE: No deformity or discharge.  MOUTH/THROAT: Lips w/o lesions.  RESPIRATORY: Breathing is even, unlabored. Lung sounds are clear   CARDIOVASCULAR: Heart RRR no murmurs, rubs or gallops. No peripheral edema.   GASTROINTESTINAL: Abdomen is soft, non-tender, not distended w/ normal bowel sounds GENITOURINARY: Bladder non tender, not distended  MUSCULOSKELETAL: No abnormal joints or musculature NEUROLOGIC: Oriented X3. Cranial nerves 2-12 grossly intact. Moves all extremities no tremor. PSYCHIATRIC: Mood and affect appropriate to situation, no  behavioral issues  Patient Active Problem List   Diagnosis Date Noted  . Diabetes mellitus with hyperosmolarity without hyperglycemic hyperosmolar nonketotic coma 09/01/2014  . Decubitus ulcer of ankle, stage 2 09/01/2014  . Acute myocardial infarction of other lateral wall, initial episode of care   . DKA (diabetic ketoacidoses) 08/20/2014  . Acute pulmonary edema 08/20/2014  . Chest pain 08/20/2014  . Hyperosmolar non-ketotic state in patient with type 2 diabetes mellitus 08/20/2014  . Protein-calorie malnutrition, severe 07/23/2014  . Hip fracture 07/22/2014  . Acetabulum fracture, left 07/22/2014  . Pelvic fracture 07/22/2014  . Acetabular fracture 07/22/2014  . End stage renal disease 12/26/2013  . Hypothyroidism   . T2DM (type 2 diabetes mellitus) 10/29/2013  . Celiac artery stenosis 08/20/2013  . Mesenteric artery stenosis 08/20/2013  . AKI (acute kidney injury) 08/13/2013  . ESRD (end stage renal disease) 04/26/2013  . PAD (peripheral artery disease) 02/15/2013  . Type 2 diabetes mellitus 02/15/2013  . Chronic diastolic heart failure 04/06/2012  . Laryngopharyngeal reflux disease 02/29/2012  . GERD (gastroesophageal reflux disease) 02/29/2012  . Diabetic peripheral neuropathy associated with type 2 diabetes mellitus 02/18/2012  . Edema of both legs 01/21/2012  . RETINOPATHY 08/31/2009  . VITAMIN D DEFICIENCY 12/12/2008  . HYPERCHOLESTEROLEMIA 01/24/2008  . ANEMIA OF CHRONIC DISEASE 01/24/2008  . HYPERTENSION 01/24/2008  . CEREBROVASCULAR DISEASE 01/24/2008  . Peripheral vascular disease with pain at rest 01/24/2008  . HIATAL HERNIA 01/24/2008  . DEGENERATIVE JOINT DISEASE 01/24/2008  . OSTEOPOROSIS 01/24/2008  . HYPOTHYROIDISM 01/23/2008  . CAD 01/23/2008  . DM (diabetes mellitus), type 2 with renal complications 11/18/1955    CBC    Component Value Date/Time   WBC 5.7 08/25/2014 0350   WBC 9.3 08/08/2014   RBC  2.74* 08/25/2014 0350   RBC 3.48* 03/20/2011 1715    HGB 9.1* 08/25/2014 0350   HCT 26.4* 08/25/2014 0350   PLT 178 08/25/2014 0350   MCV 96.4 08/25/2014 0350   LYMPHSABS 0.6* 08/20/2014 0821   MONOABS 0.7 08/20/2014 0821   EOSABS 0.0 08/20/2014 0821   BASOSABS 0.0 08/20/2014 0821    CMP     Component Value Date/Time   NA 136* 08/25/2014 0350   NA 131* 08/08/2014   K 4.0 08/25/2014 0350   CL 99 08/25/2014 0350   CO2 23 08/25/2014 0350   GLUCOSE 177* 08/25/2014 0350   BUN 29* 08/25/2014 0350   BUN 44* 08/08/2014   CREATININE 3.77* 08/25/2014 0350   CREATININE 3.8* 08/08/2014   CALCIUM 8.1* 08/25/2014 0350   CALCIUM 8.1* 11/07/2013 1320   PROT 6.7 05/22/2014 1421   ALBUMIN 2.2* 08/25/2014 0350   AST 28 08/08/2014   ALT 30 08/08/2014   ALKPHOS 185* 08/08/2014   BILITOT 0.9 05/22/2014 1421   GFRNONAA 11* 08/25/2014 0350   GFRAA 12* 08/25/2014 0350    Assessment and Plan  Acute pulmonary edema -Resolved with HD  -Had noted increased O2 requirements after gentle IVF and blood transfusion on 9/17 (see below)  -Again resolved with HD and now on minimal O2 support  -cont volume management per Nephrology    CAD PCI 1998 x 2 - LHC 6/12 with calcified proximal LCx stenosis, not flow limiting by FFR - pt s/p cath 9/14 w/ DES to 90% proximal L circ stenosis, also noting mod to mod diffuse disease of L main and LAD - Cardiology following - on DAPT - ongoing care per Cardiology    Diabetes mellitus with hyperosmolarity without hyperglycemic hyperosmolar nonketotic coma  HONK on admission  -glucose initially improved with insulin gtt  - CBG noted to be hypoglycemic with glucose of <10 on 9/16 after transitioning from insulin gtt, improved with hypoglycemic protocol  -Hypoglycemia resolved, but BS suboptimal, staying in the 200's with Lantus resumed at 12units  -Cont SSI  -Would resume re-meal coverage as per home regimen on d/c    ESRD (end stage renal disease) Resume Mon, wed, fri dialysis  Acetabulum fracture, left -Evaluated by Dr. Carola Frost during prior  admit - bed to chair for another 4-5 weeks per original plan (slide or lift transfers only during the beginning phases, then progress to stand pivot transfers on her right leg)  - thereafter may begin some graduated weight bearing    Decubitus ulcer of ankle, stage 2 Wound care dressing recs: Vaseline dressing to left posterior leg to promote moist healing. Foam dressing to left heel and left foot to promote healing. Float left heel to reduce pressure   Hypothyroidism Continue synthroid  ANEMIA OF CHRONIC DISEASE Hgb noted to be trending down - no obvious source of blood loss  -Anemia was apparently noted in the outpatient setting as well and was worked up  -transfused 1 unit prbc 9/17 and another unit on HD the following day with good correction in Hgb  -Hgb appears to be holding steady at 9.1 on 9/19   HYPERCHOLESTEROLEMIA Continue lipitor    Margit Hanks, MD

## 2014-08-29 NOTE — Telephone Encounter (Signed)
Spoke with patient.  She stated that she was currently on the dialysis machine and would like for me to call back later.  She also informed me that she broke her hip (L acetabular fracture) back in July and is currently residing at [facility].

## 2014-08-31 ENCOUNTER — Other Ambulatory Visit: Payer: Medicare Other

## 2014-09-01 ENCOUNTER — Encounter: Payer: Self-pay | Admitting: Internal Medicine

## 2014-09-01 DIAGNOSIS — E1100 Type 2 diabetes mellitus with hyperosmolarity without nonketotic hyperglycemic-hyperosmolar coma (NKHHC): Secondary | ICD-10-CM | POA: Insufficient documentation

## 2014-09-01 DIAGNOSIS — L89502 Pressure ulcer of unspecified ankle, stage 2: Secondary | ICD-10-CM | POA: Insufficient documentation

## 2014-09-01 NOTE — Assessment & Plan Note (Signed)
PCI 1998 x 2 - LHC 6/12 with calcified proximal LCx stenosis, not flow limiting by FFR - pt s/p cath 9/14 w/ DES to 90% proximal L circ stenosis, also noting mod to mod diffuse disease of L main and LAD - Cardiology following - on DAPT - ongoing care per Cardiology

## 2014-09-01 NOTE — Assessment & Plan Note (Signed)
Hgb noted to be trending down - no obvious source of blood loss  -Anemia was apparently noted in the outpatient setting as well and was worked up  -transfused 1 unit prbc 9/17 and another unit on HD the following day with good correction in Hgb  -Hgb appears to be holding steady at 9.1 on 9/19

## 2014-09-01 NOTE — Assessment & Plan Note (Signed)
Resume Mon, wed, fri dialysis

## 2014-09-01 NOTE — Assessment & Plan Note (Signed)
Continue synthroid.

## 2014-09-01 NOTE — Assessment & Plan Note (Signed)
HONK on admission  -glucose initially improved with insulin gtt  - CBG noted to be hypoglycemic with glucose of <10 on 9/16 after transitioning from insulin gtt, improved with hypoglycemic protocol  -Hypoglycemia resolved, but BS suboptimal, staying in the 200's with Lantus resumed at 12units  -Cont SSI  -Would resume re-meal coverage as per home regimen on d/c

## 2014-09-01 NOTE — Assessment & Plan Note (Signed)
-  Evaluated by Dr. Carola Frost during prior admit - bed to chair for another 4-5 weeks per original plan (slide or lift transfers only during the beginning phases, then progress to stand pivot transfers on her right leg)  - thereafter may begin some graduated weight bearing

## 2014-09-01 NOTE — Assessment & Plan Note (Signed)
Continue lipitor  ?

## 2014-09-01 NOTE — Assessment & Plan Note (Signed)
Wound care dressing recs: Vaseline dressing to left posterior leg to promote moist healing. Foam dressing to left heel and left foot to promote healing. Float left heel to reduce pressure

## 2014-09-01 NOTE — Assessment & Plan Note (Signed)
-  Resolved with HD  -Had noted increased O2 requirements after gentle IVF and blood transfusion on 9/17 (see below)  -Again resolved with HD and now on minimal O2 support  -cont volume management per Nephrology

## 2014-09-02 ENCOUNTER — Other Ambulatory Visit: Payer: Self-pay | Admitting: Internal Medicine

## 2014-09-05 NOTE — Telephone Encounter (Signed)
Nursing home admission on 08/29/14.

## 2014-09-13 ENCOUNTER — Encounter: Payer: Self-pay | Admitting: Physician Assistant

## 2014-09-13 ENCOUNTER — Encounter: Payer: Medicare Other | Admitting: Physician Assistant

## 2014-09-13 NOTE — Progress Notes (Deleted)
Cardiology Office Note    Date:  09/13/2014   ID:  Nicole SalinaJeanette B Reppert, DOB 1935/04/11, MRN [id number]  PCP:  Loreen FreudYvonne Lowne, DO  Cardiologist:  Dr. Tobias AlexanderKatarina Nelson      History of Present Illness: Nicole [name] is a 79 y.o. female with a hx of diastolic CHF, CAD s/p prior stent to CFX, PAD, carotid stenosis, HTN, HL, DM2, ESRD (hemodialysis on Mon, Wed, Fri), hypothyroidism.  She is s/p L acetabular fx treated medically in 07/2014.  She has been in rehab.  She was admitted 9/14-9/19 with a NSTEMI in the setting of a/c diastolic CHF and hyperosmolar non-ketotic state (HONK).  LHC demonstrated high-grade proximal circumflex disease treated with a Promus DES. Echocardiogram demonstrated preserved LV function with an EF of 50-55%.  Patient had worsening hemoglobin. She required transfusion with PRBCs x2.  She returns for FU.  ***   Studies:  - LHC (9/15):  Ostial LM mild disease, mid LAD mild disease, ostial Dx mod disease, prox CFX 90%, OM1 stent ok, ostial RCA disease (non-dominant); LVEDP 24 mmHg >>> PCI:  2.25 x 16 Promus DES to CFX  - Echo (91/5):  Mild LVH, EF 50-55%, mild MR, mod LAE  - Carotid US (8/14):  Bilateral ICA 40-59% >> repeat 1 year  - Renal A-gram (9/14):  Patent renal arteries bilaterally   Recent Labs/Images:  Recent Labs  05/22/14 1421  08/08/14 08/20/14 0323  08/25/14 0350  NA 135  < > 131*  --   < > 136*  K 3.4*  < > 4.1  --   < > 4.0  BUN 21  < > 44*  --   < > 29*  CREATININE 2.4*  < > 3.8*  --   < > 3.77*  ALT 21  --  30  --   --   --   HGB 12.7  < > 11.1*  --   < > 9.1*  TSH 0.91  --   --   --   --   --   LDLCALC 70  --   --   --   --   --   HDL 60.80  --   --   --   --   --   PROBNP  --   --   --  9463.0*  --   --   < > = values in this interval not displayed.    Dg Chest Portable 1 View   08/20/2014   IMPRESSION: 1. Cardiomegaly with findings suggestive of bibasilar pulmonary edema. Superimposed infection could be considered in the correct  clinical setting. 2. Probable small left pleural effusion.   Electronically Signed   By: Rise MuBenjamin  McClintock M.D.   On: 08/20/2014 04:35     Wt Readings from Last 3 Encounters:  08/25/14 109 lb 5.6 oz (49.6 kg)  08/25/14 109 lb 5.6 oz (49.6 kg)  07/25/14 97 lb 3.6 oz (44.101 kg)     Past Medical History  Diagnosis Date  . Retinopathy   . Hypertension   . Chronic diastolic CHF (congestive heart failure)     Echo (9/15):  Mild LVH, EF 50-55%, mild MR, mod LAE  . CAD (coronary artery disease)     a.  Prior PCI in 1998 x 2; b.  s/p cath in 2001, c.  negative Myoview in 2008 & 2011, d.  s/p cath in June 2012 with calcified disease of the proximal LCX with normal flow reserve. e. NSTEMI (9/15):  LHC (9/15):  Ostial LM mild disease, mid LAD mild disease, ostial Dx mod disease, prox CFX 90%, OM1 stent ok, ostial RCA disease (non-dominant); LVEDP 24 mmHg >>> PCI:  2.25 x 16 Promus DES to CFX   . Cerebrovascular disease     a. Carotid US (8/14):  Bilateral ICA 40-59% >> repeat 1 year  . Peripheral vascular disease     prior angiography showing RSFA stenosis, left anterior tibial and left posterior tibial stenoses  . Hypercholesterolemia   . Diabetes mellitus     insulin dependent  . Hypothyroidism   . Hiatal hernia   . Degenerative joint disease   . Osteoporosis   . Vitamin D deficiency   . Anemia of chronic disease   . ESRD (end stage renal disease)     a. CKD progressed to ESRD 10/2013. b. s/p LUE basilic transposition by vascular on 11/08/13.  . Dialysis patient     [facility], and Friday  . Left acetabular fracture 07/2014    complex - managemed medically    Current Outpatient Prescriptions  Medication Sig Dispense Refill  . acetaminophen (TYLENOL) 650 MG CR tablet Take 650 mg by mouth every 8 (eight) hours as needed for pain.      Marland Kitchen amLODipine (NORVASC) 10 MG tablet Take 1 tablet (10 mg total) by mouth daily.  30 tablet  0  . aspirin EC 81 MG tablet Take 81 mg by mouth daily.        Marland Kitchen atorvastatin (LIPITOR) 20 MG tablet Take 1 tablet (20 mg total) by mouth daily at 6 PM.  30 tablet  0  . carvedilol (COREG) 12.5 MG tablet Take 1 tablet (12.5 mg total) by mouth 2 (two) times daily with a meal.  180 tablet  0  . Cholecalciferol (VITAMIN D) 1000 UNITS capsule Take 1,000 Units by mouth daily.       . cloNIDine (CATAPRES) 0.1 MG tablet Take 0.1 mg by mouth daily at 10 pm.       . docusate sodium 100 MG CAPS Take 100 mg by mouth 2 (two) times daily.      . famotidine (PEPCID) 20 MG tablet Take 20 mg by mouth daily.      Marland Kitchen HEPARIN SODIUM, BOVINE, IJ Inject 5,000 Units as directed 3 (three) times daily.      . hydrALAZINE (APRESOLINE) 50 MG tablet Take 50 mg by mouth every 6 (six) hours.      Marland Kitchen HYDROcodone-acetaminophen (NORCO/VICODIN) 5-325 MG per tablet Take 1-2 tablets by mouth every 6 (six) hours as needed for moderate pain.  240 tablet  0  . insulin aspart (NOVOLOG) 100 UNIT/ML injection Inject 0-9 Units into the skin 3 (three) times daily with meals. CBG < 70: implement hypoglycemia protocol CBG 70 - 120: 0 units CBG 121 - 150: 1 unit CBG 151 - 200: 2 units CBG 201 - 250: 3 units CBG 251 - 300: 5 units CBG 301 - 350: 7 units CBG 351 - 400: 9 units CBG > 400: call MD.      . insulin aspart (NOVOLOG) 100 UNIT/ML injection Inject 5 Units into the skin 2 (two) times daily. Before breakfast and before lunch      . insulin glargine (LANTUS) 100 UNIT/ML injection Inject 0.12 mLs (12 Units total) into the skin daily.  10 mL  0  . isosorbide mononitrate (ISMO,MONOKET) 20 MG tablet Take 1 tablet (20 mg total) by mouth 2 (two) times daily.  180 tablet  0  .  levothyroxine (SYNTHROID, LEVOTHROID) 88 MCG tablet Take 88 mcg by mouth daily before breakfast.      . ondansetron (ZOFRAN) 4 MG tablet Take 4 mg by mouth every 6 (six) hours as needed for nausea or vomiting.      . senna-docusate (SENOKOT-S) 8.6-50 MG per tablet Take 2 tablets by mouth 2 (two) times daily.      . sevelamer  (RENAGEL) 800 MG tablet Take 1,600 mg by mouth 3 (three) times daily with meals.      . ticagrelor (BRILINTA) 90 MG TABS tablet Take 1 tablet (90 mg total) by mouth 2 (two) times daily.  60 tablet  0   No current facility-administered medications for this visit.     Allergies:   Ibuprofen; Prednisone; and Procrit   Social History:  The patient  reports that she quit smoking about 39 years ago. Her smoking use included Cigarettes. She smoked 0.00 packs per day. She has never used smokeless tobacco. She reports that she does not drink alcohol or use illicit drugs.   Family History:  The patient's family history includes Cancer in her brother, father, and mother; Heart disease in her mother.   ROS:  Please see the history of present illness.   ***   All other systems reviewed and negative.    PHYSICAL EXAM: VS:  There were no vitals taken for this visit. Well nourished, well developed, in no acute distress HEENT: normal Neck: ***no JVD Cardiac:  normal S1, S2; ***RRR; no murmur Lungs:  ***clear to auscultation bilaterally, no wheezing, rhonchi or rales Abd: soft, nontender, no hepatomegaly Ext: ***no edema Skin: warm and dry Neuro:  CNs 2-12 intact, no focal abnormalities noted  EKG:  ***      ASSESSMENT AND PLAN:  Coronary Artery Disease  Chronic diastolic heart failure  ESRD (end stage renal disease)  Essential hypertension  HYPERCHOLESTEROLEMIA  Cerebrovascular disease  Type 2 diabetes mellitus with diabetic nephropathy  Anemia of chronic disease   Disposition:   FU with ***   Signed, Tereso Newcomer, PA-C, MHS 09/13/2014 9:39 AM    [facility] [address] [place], [place], [place]  [address] Phone: [phone]; Fax: [phone]

## 2014-09-13 NOTE — Progress Notes (Signed)
This encounter was created in error - please disregard.

## 2014-10-23 ENCOUNTER — Non-Acute Institutional Stay (SKILLED_NURSING_FACILITY): Payer: Medicare Other | Admitting: Internal Medicine

## 2014-10-23 DIAGNOSIS — S91002D Unspecified open wound, left ankle, subsequent encounter: Secondary | ICD-10-CM

## 2014-10-23 DIAGNOSIS — E1142 Type 2 diabetes mellitus with diabetic polyneuropathy: Secondary | ICD-10-CM

## 2014-10-23 DIAGNOSIS — S81002D Unspecified open wound, left knee, subsequent encounter: Secondary | ICD-10-CM

## 2014-10-23 DIAGNOSIS — G629 Polyneuropathy, unspecified: Secondary | ICD-10-CM

## 2014-10-23 DIAGNOSIS — S81802D Unspecified open wound, left lower leg, subsequent encounter: Secondary | ICD-10-CM

## 2014-10-25 NOTE — Progress Notes (Addendum)
Patient ID: Nicole [name], female   DOB: 1935-06-28, 79 y.o.   MRN: [id number]               PROGRESS NOTE  DATE:  10/23/2014     [facility]     LEVEL OF CARE:   SNF   Acute Visit   CHIEF COMPLAINT:  Review of left lower extremity wounds.    HISTORY OF PRESENT ILLNESS:  This is a frail patient who recently came to us after admission to hospital for acute pulmonary edema.    She is a type 2 diabetic, on hemodialysis.    She also has known significant PAD with arterial dopplers from September 2014 showing a left anterior tibial brachial index of 0.58 and a posterior tibial index of 0.36.  Doppler waveforms in these areas showed monophasic waveforms.  She has seen Vascular Surgery, although I think this is mostly for dialysis access.    PHYSICAL EXAMINATION:   SKIN:  INSPECTION:  She has two wounds on the left leg, one on the posterior left heel near the achilles tendon insertion site.  This is covered with an adherent, fibrinous eschar.  She has also a very serious wound on the left anterolateral lower extremity showing exposed tendon.  There is surrounding erythema here.  Both of these wounds were anesthetized with topical lidocaine and debrided of adherent slough and eschar.  The superior wound is surrounded by erythema.  I cannot exclude coexistent cellulitis here.  A wound culture was done of the wound bed.  As mentioned, there is exposed tendon.    ASSESSMENT/PLAN:    Ischemic wounds on the left lower extremity in the setting of type 2 DM.  Her arterial studies from September 2014 are listed below.  There is some reference to prior lower extremity angiography, although I cannot really find this result at the moment.  I am going to dress the superior wound with silver collagen.  A culture of this was done.  I am going to put her back on doxycycline.  To the left heel wound, we will continue with Santyl-based dressing.    Overall, I think she may need to go back to see  the vascular surgeons to see if anything further can be done about the blood flow to these wounds.  I also think the wound with exposed tendon may eventually benefit from an Apligraf if the wound bed can be debrided adequately and infection controlled.                Lower Extremity Arterial Evaluation  Patient:    Nicole Salinadwards, Keosha B MR #:       [id number] Study Date: 08/15/2013 Gender:     F Age:        79 Height: Weight: BSA: Pt. Status: Room:    Sharol HarnessADMITTING    David, Rachal  ATTENDING    Marinda ElkFeliz Ortiz, Abraham  SONOGRAPHER  Gertie FeyMichelle Simonetti, RVT, RDCS, RDMS  ORDERING     Tobias AlexanderKatarina Nelson, M.D. Reports also to:  ------------------------------------------------------------ History and indications:  Indications  443.9 Peripheral vascular disease unspecified.  History  Diagnostic evaluation.  ------------------------------------------------------------ Study information:  Study status:  Routine.  Procedure:  A vascular evaluation was performed with the patient in the supine position. Image quality was adequate.    Lower extremity arterial duplex study.     Duplex arterial evaluation.  Location:  Vascular laboratory.  Patient status:  Inpatient.  Brachial pressures:  +--------+-----+----+---+  RightLeftMax +--------+-----+----+---+ Systolic178  173 178 +--------+-----+----+---+ Arterial pressure indices:  +-----------------+--------+--------------+----------+ Location         PressureBrachial indexWaveform   +-----------------+--------+--------------+----------+ Right ant tibial 81mm Hg 0.46          Monophasic +-----------------+--------+--------------+----------+ Right post tibial74mm Hg 0.52          Monophasic +-----------------+--------+--------------+----------+ Left ant tibial  Hg0.58          Monophasic +-----------------+--------+--------------+----------+ Left post tibial 64mm Hg 0.36           Monophasic +-----------------+--------+--------------+----------+ Arterial flow:  +---------------+--------+--------------+------------------+ Location       V sys   Flow analysis Comment            +---------------+--------+--------------+------------------+ Right external 145cm/s Triphasic     ------------------ iliac                  waveform                         +---------------+--------+--------------+------------------+ Right common   283cm/s Biphasic      Moderate           femoral                waveform      heterogenous                                            plaque.            +---------------+--------+--------------+------------------+ Right profunda 239cm/s Biphasic      ------------------ femoral                waveform                         +---------------+--------+--------------+------------------+ Right femoral -271cm/s Monophasic    ------------------ proximal               waveform                         +---------------+--------+--------------+------------------+ Right femoral -43cm/s  Monophasic    ------------------ mid                    waveform                         +---------------+--------+--------------+------------------+ Right femoral -72cm/s  Monophasic    ------------------ distal                 waveform                         +---------------+--------+--------------+------------------+ Right popliteal66cm/s  Monophasic    ------------------ - proximal             waveform                         +---------------+--------+--------------+------------------+ Right popliteal44cm/s  Monophasic    ------------------ - distal               waveform                         +---------------+--------+--------------+------------------+ Right dorsal   82cm/s  Monophasic    ------------------ pedal  waveform                          +---------------+--------+--------------+------------------+ Right posterior27cm/s  Monophasic    ------------------ tibial -               waveform                         proximal                                                +---------------+--------+--------------+------------------+ Right posterior52cm/s  Monophasic    ------------------ tibial - mid           waveform                         +---------------+--------+--------------+------------------+ Right posterior10cm/s  --------------Dampened           tibial - distal                      monophasic                                              waveform.          +---------------+--------+--------------+------------------+ Right peroneal 29cm/s  Monophasic    ------------------ - proximal             waveform                         +---------------+--------+--------------+------------------+ Right peroneal 44cm/s  Monophasic    ------------------ - mid                  waveform                         +---------------+--------+--------------+------------------+ Right peroneal 42cm/s  Monophasic    ------------------ - distal               waveform                         +---------------+--------+--------------+------------------+ Left external  103cm/s --------------Moderate smooth    iliac                                heterogenous                                            plaque.            +---------------+--------+--------------+------------------+ Left common    162cm/s Monophasic    ------------------ femoral                waveform                         +---------------+--------+--------------+------------------+ Left profunda  159cm/s Monophasic    ------------------ femoral -              waveform  proximal                                                 +---------------+--------+--------------+------------------+ Left femoral - 264cm/s Monophasic    Moderate           proximal               waveform      heterogenous                                            plaque.            +---------------+--------+--------------+------------------+ Left femoral - -117cm/sMonophasic    ------------------ mid                    waveform                         +---------------+--------+--------------+------------------+ Left femoral - -59cm/s Monophasic    ------------------ distal                 waveform                         +---------------+--------+--------------+------------------+ Left popliteal 72cm/s  Monophasic    ------------------ - proximal             waveform                         +---------------+--------+--------------+------------------+ Left popliteal 53cm/s  Monophasic    ------------------ - distal               waveform                         +---------------+--------+--------------+------------------+ Left posterior 49cm/s  Monophasic    ------------------ tibial -               waveform                         proximal                                                +---------------+--------+--------------+------------------+ Left posterior 46cm/s  Monophasic    ------------------ tibial - mid           waveform                         +---------------+--------+--------------+------------------+ Left posterior 32cm/s  Monophasic    ------------------ tibial - distal        waveform                         +---------------+--------+--------------+------------------+ Left peroneal -34cm/s  Monophasic    ------------------ distal                 waveform                         +---------------+--------+--------------+------------------+ Left dorsal  34cm/s  Monophasic    ------------------ pedal                   waveform                         +---------------+--------+--------------+------------------+  ------------------------------------------------------------ Summary: Findings consistent with >50% stenosis of the right common femoral artery into the right profunda femoral and femoral arteries. There is somecollateral flow noted.  Bilateral ABIs are suggestive of moderate arterial insufficiency. Other specific details can be found in the table(s) above.    Prepared and Electronically Authenticated by  Cari Carawayickson, Chris 2014-09-09T16:12:[id number]

## 2014-11-13 ENCOUNTER — Encounter (HOSPITAL_COMMUNITY): Payer: Self-pay | Admitting: *Deleted

## 2014-11-15 ENCOUNTER — Encounter (HOSPITAL_COMMUNITY): Payer: Self-pay | Admitting: Cardiovascular Disease

## 2014-11-20 ENCOUNTER — Non-Acute Institutional Stay (SKILLED_NURSING_FACILITY): Payer: Medicare Other | Admitting: Internal Medicine

## 2014-11-20 DIAGNOSIS — S81802D Unspecified open wound, left lower leg, subsequent encounter: Secondary | ICD-10-CM

## 2014-11-20 DIAGNOSIS — M869 Osteomyelitis, unspecified: Secondary | ICD-10-CM

## 2014-11-20 DIAGNOSIS — E1151 Type 2 diabetes mellitus with diabetic peripheral angiopathy without gangrene: Secondary | ICD-10-CM

## 2014-11-20 DIAGNOSIS — S81002D Unspecified open wound, left knee, subsequent encounter: Secondary | ICD-10-CM

## 2014-11-20 DIAGNOSIS — S91002D Unspecified open wound, left ankle, subsequent encounter: Secondary | ICD-10-CM

## 2014-11-20 DIAGNOSIS — E1169 Type 2 diabetes mellitus with other specified complication: Secondary | ICD-10-CM

## 2014-11-20 DIAGNOSIS — M9080 Osteopathy in diseases classified elsewhere, unspecified site: Secondary | ICD-10-CM

## 2014-11-22 ENCOUNTER — Inpatient Hospital Stay (HOSPITAL_COMMUNITY): Payer: Medicare Other

## 2014-11-22 ENCOUNTER — Emergency Department (HOSPITAL_COMMUNITY): Payer: Medicare Other

## 2014-11-22 ENCOUNTER — Inpatient Hospital Stay (HOSPITAL_COMMUNITY)
Admission: EM | Admit: 2014-11-22 | Discharge: 2014-11-27 | DRG: 699 | Disposition: A | Discharge status: Skilled Nursing Facility | Payer: Medicare Other | Attending: Internal Medicine | Admitting: Internal Medicine

## 2014-11-22 ENCOUNTER — Encounter (HOSPITAL_COMMUNITY): Payer: Self-pay | Admitting: Emergency Medicine

## 2014-11-22 DIAGNOSIS — E10649 Type 1 diabetes mellitus with hypoglycemia without coma: Secondary | ICD-10-CM | POA: Diagnosis present

## 2014-11-22 DIAGNOSIS — M810 Age-related osteoporosis without current pathological fracture: Secondary | ICD-10-CM | POA: Diagnosis present

## 2014-11-22 DIAGNOSIS — L97209 Non-pressure chronic ulcer of unspecified calf with unspecified severity: Secondary | ICD-10-CM | POA: Diagnosis present

## 2014-11-22 DIAGNOSIS — R4182 Altered mental status, unspecified: Secondary | ICD-10-CM | POA: Diagnosis present

## 2014-11-22 DIAGNOSIS — I5032 Chronic diastolic (congestive) heart failure: Secondary | ICD-10-CM | POA: Diagnosis present

## 2014-11-22 DIAGNOSIS — Z7982 Long term (current) use of aspirin: Secondary | ICD-10-CM | POA: Diagnosis not present

## 2014-11-22 DIAGNOSIS — L89629 Pressure ulcer of left heel, unspecified stage: Secondary | ICD-10-CM | POA: Diagnosis present

## 2014-11-22 DIAGNOSIS — Z992 Dependence on renal dialysis: Secondary | ICD-10-CM

## 2014-11-22 DIAGNOSIS — Z87891 Personal history of nicotine dependence: Secondary | ICD-10-CM | POA: Diagnosis not present

## 2014-11-22 DIAGNOSIS — Z794 Long term (current) use of insulin: Secondary | ICD-10-CM

## 2014-11-22 DIAGNOSIS — D638 Anemia in other chronic diseases classified elsewhere: Secondary | ICD-10-CM | POA: Diagnosis present

## 2014-11-22 DIAGNOSIS — Z9842 Cataract extraction status, left eye: Secondary | ICD-10-CM

## 2014-11-22 DIAGNOSIS — W19XXXA Unspecified fall, initial encounter: Secondary | ICD-10-CM

## 2014-11-22 DIAGNOSIS — S32402A Unspecified fracture of left acetabulum, initial encounter for closed fracture: Secondary | ICD-10-CM | POA: Diagnosis present

## 2014-11-22 DIAGNOSIS — Y929 Unspecified place or not applicable: Secondary | ICD-10-CM

## 2014-11-22 DIAGNOSIS — L03115 Cellulitis of right lower limb: Secondary | ICD-10-CM | POA: Diagnosis present

## 2014-11-22 DIAGNOSIS — M1990 Unspecified osteoarthritis, unspecified site: Secondary | ICD-10-CM | POA: Diagnosis present

## 2014-11-22 DIAGNOSIS — I120 Hypertensive chronic kidney disease with stage 5 chronic kidney disease or end stage renal disease: Secondary | ICD-10-CM | POA: Diagnosis present

## 2014-11-22 DIAGNOSIS — N2581 Secondary hyperparathyroidism of renal origin: Secondary | ICD-10-CM | POA: Diagnosis present

## 2014-11-22 DIAGNOSIS — T798XXA Other early complications of trauma, initial encounter: Secondary | ICD-10-CM

## 2014-11-22 DIAGNOSIS — Z7901 Long term (current) use of anticoagulants: Secondary | ICD-10-CM | POA: Diagnosis not present

## 2014-11-22 DIAGNOSIS — H5461 Unqualified visual loss, right eye, normal vision left eye: Secondary | ICD-10-CM | POA: Diagnosis present

## 2014-11-22 DIAGNOSIS — N186 End stage renal disease: Secondary | ICD-10-CM | POA: Diagnosis present

## 2014-11-22 DIAGNOSIS — Z955 Presence of coronary angioplasty implant and graft: Secondary | ICD-10-CM

## 2014-11-22 DIAGNOSIS — E785 Hyperlipidemia, unspecified: Secondary | ICD-10-CM | POA: Diagnosis present

## 2014-11-22 DIAGNOSIS — I739 Peripheral vascular disease, unspecified: Secondary | ICD-10-CM | POA: Diagnosis present

## 2014-11-22 DIAGNOSIS — Z9841 Cataract extraction status, right eye: Secondary | ICD-10-CM | POA: Diagnosis not present

## 2014-11-22 DIAGNOSIS — I2510 Atherosclerotic heart disease of native coronary artery without angina pectoris: Secondary | ICD-10-CM | POA: Diagnosis present

## 2014-11-22 DIAGNOSIS — I4581 Long QT syndrome: Secondary | ICD-10-CM | POA: Diagnosis present

## 2014-11-22 DIAGNOSIS — I252 Old myocardial infarction: Secondary | ICD-10-CM

## 2014-11-22 DIAGNOSIS — Z880 Allergy status to penicillin: Secondary | ICD-10-CM | POA: Diagnosis not present

## 2014-11-22 DIAGNOSIS — E43 Unspecified severe protein-calorie malnutrition: Secondary | ICD-10-CM | POA: Diagnosis present

## 2014-11-22 DIAGNOSIS — Z8673 Personal history of transient ischemic attack (TIA), and cerebral infarction without residual deficits: Secondary | ICD-10-CM | POA: Diagnosis not present

## 2014-11-22 DIAGNOSIS — E039 Hypothyroidism, unspecified: Secondary | ICD-10-CM | POA: Diagnosis present

## 2014-11-22 DIAGNOSIS — Z681 Body mass index (BMI) 19 or less, adult: Secondary | ICD-10-CM | POA: Diagnosis not present

## 2014-11-22 DIAGNOSIS — Z66 Do not resuscitate: Secondary | ICD-10-CM | POA: Diagnosis present

## 2014-11-22 DIAGNOSIS — Z7401 Bed confinement status: Secondary | ICD-10-CM

## 2014-11-22 DIAGNOSIS — E1129 Type 2 diabetes mellitus with other diabetic kidney complication: Secondary | ICD-10-CM | POA: Diagnosis present

## 2014-11-22 DIAGNOSIS — I7025 Atherosclerosis of native arteries of other extremities with ulceration: Secondary | ICD-10-CM

## 2014-11-22 DIAGNOSIS — E162 Hypoglycemia, unspecified: Secondary | ICD-10-CM | POA: Diagnosis present

## 2014-11-22 DIAGNOSIS — H3500 Unspecified background retinopathy: Secondary | ICD-10-CM | POA: Diagnosis present

## 2014-11-22 DIAGNOSIS — E1029 Type 1 diabetes mellitus with other diabetic kidney complication: Secondary | ICD-10-CM | POA: Diagnosis present

## 2014-11-22 DIAGNOSIS — L97919 Non-pressure chronic ulcer of unspecified part of right lower leg with unspecified severity: Secondary | ICD-10-CM | POA: Diagnosis present

## 2014-11-22 DIAGNOSIS — L97529 Non-pressure chronic ulcer of other part of left foot with unspecified severity: Secondary | ICD-10-CM

## 2014-11-22 HISTORY — DX: Major depressive disorder, single episode, unspecified: F32.9

## 2014-11-22 HISTORY — DX: Depression, unspecified: F32.A

## 2014-11-22 HISTORY — DX: Acute myocardial infarction, unspecified: I21.9

## 2014-11-22 LAB — CBC
HCT: 35.8 % — ABNORMAL LOW (ref 36.0–46.0)
HEMOGLOBIN: 11.3 g/dL — AB (ref 12.0–15.0)
MCH: 32.1 pg (ref 26.0–34.0)
MCHC: 31.6 g/dL (ref 30.0–36.0)
MCV: 101.7 fL — ABNORMAL HIGH (ref 78.0–100.0)
Platelets: 165 10*3/uL (ref 150–400)
RBC: 3.52 MIL/uL — AB (ref 3.87–5.11)
RDW: 15 % (ref 11.5–15.5)
WBC: 9.3 10*3/uL (ref 4.0–10.5)

## 2014-11-22 LAB — CBG MONITORING, ED
GLUCOSE-CAPILLARY: 115 mg/dL — AB (ref 70–99)
GLUCOSE-CAPILLARY: 76 mg/dL (ref 70–99)
Glucose-Capillary: 64 mg/dL — ABNORMAL LOW (ref 70–99)
Glucose-Capillary: 100 mg/dL — ABNORMAL HIGH (ref 70–99)
Glucose-Capillary: 117 mg/dL — ABNORMAL HIGH (ref 70–99)

## 2014-11-22 LAB — COMPREHENSIVE METABOLIC PANEL
ALK PHOS: 233 U/L — AB (ref 39–117)
ALT: 11 U/L (ref 0–35)
AST: 14 U/L (ref 0–37)
Albumin: 2.5 g/dL — ABNORMAL LOW (ref 3.5–5.2)
Anion gap: 14 (ref 5–15)
BUN: 17 mg/dL (ref 6–23)
CALCIUM: 8.9 mg/dL (ref 8.4–10.5)
CHLORIDE: 98 meq/L (ref 96–112)
CO2: 26 mEq/L (ref 19–32)
Creatinine, Ser: 2.55 mg/dL — ABNORMAL HIGH (ref 0.50–1.10)
GFR calc Af Amer: 20 mL/min — ABNORMAL LOW (ref >90)
GFR, EST NON AFRICAN AMERICAN: 17 mL/min — AB (ref >90)
GLUCOSE: 83 mg/dL (ref 70–99)
Potassium: 3.9 mEq/L (ref 3.7–5.3)
SODIUM: 138 meq/L (ref 137–147)
Total Bilirubin: 0.4 mg/dL (ref 0.3–1.2)
Total Protein: 6.4 g/dL (ref 6.0–8.3)

## 2014-11-22 LAB — SEDIMENTATION RATE: SED RATE: 54 mm/h — AB (ref 0–22)

## 2014-11-22 LAB — URINALYSIS, ROUTINE W REFLEX MICROSCOPIC
BILIRUBIN URINE: NEGATIVE
GLUCOSE, UA: NEGATIVE mg/dL
Ketones, ur: 15 mg/dL — AB
Nitrite: POSITIVE — AB
Specific Gravity, Urine: 1.02 (ref 1.005–1.030)
Urobilinogen, UA: 0.2 mg/dL (ref 0.0–1.0)
pH: 6.5 (ref 5.0–8.0)

## 2014-11-22 LAB — URINE MICROSCOPIC-ADD ON

## 2014-11-22 LAB — PROTIME-INR
INR: 0.93 (ref 0.00–1.49)
Prothrombin Time: 12.5 seconds (ref 11.6–15.2)

## 2014-11-22 LAB — GLUCOSE, CAPILLARY
Glucose-Capillary: 143 mg/dL — ABNORMAL HIGH (ref 70–99)
Glucose-Capillary: 273 mg/dL — ABNORMAL HIGH (ref 70–99)
Glucose-Capillary: 367 mg/dL — ABNORMAL HIGH (ref 70–99)

## 2014-11-22 LAB — I-STAT VENOUS BLOOD GAS, ED
Acid-Base Excess: 6 mmol/L — ABNORMAL HIGH (ref 0.0–2.0)
BICARBONATE: 30.8 meq/L — AB (ref 20.0–24.0)
O2 Saturation: 99 %
PCO2 VEN: 44.5 mmHg — AB (ref 45.0–50.0)
TCO2: 32 mmol/L (ref 0–100)
pH, Ven: 7.448 — ABNORMAL HIGH (ref 7.250–7.300)
pO2, Ven: 153 mmHg — ABNORMAL HIGH (ref 30.0–45.0)

## 2014-11-22 LAB — TROPONIN I: Troponin I: <0.3 ng/mL (ref <0.30)

## 2014-11-22 LAB — I-STAT CG4 LACTIC ACID, ED: Lactic Acid, Venous: 1.8 mmol/L (ref 0.5–2.2)

## 2014-11-22 MED ORDER — ASPIRIN EC 81 MG PO TBEC
81.0000 mg | DELAYED_RELEASE_TABLET | Freq: Every day | ORAL | Status: DC
  Administered 2014-11-22 – 2014-11-27 (×6): 81 mg via ORAL
  Filled 2014-11-22 (×6): qty 1

## 2014-11-22 MED ORDER — VANCOMYCIN HCL 500 MG IV SOLR
500.0000 mg | INTRAVENOUS | Status: DC
  Administered 2014-11-23 – 2014-11-25 (×2): 500 mg via INTRAVENOUS
  Filled 2014-11-22 (×3): qty 500

## 2014-11-22 MED ORDER — CARVEDILOL 12.5 MG PO TABS
12.5000 mg | ORAL_TABLET | Freq: Two times a day (BID) | ORAL | Status: DC
  Administered 2014-11-22 – 2014-11-27 (×10): 12.5 mg via ORAL
  Filled 2014-11-22 (×13): qty 1

## 2014-11-22 MED ORDER — ACETAMINOPHEN 650 MG RE SUPP: 650.0000 mg | Freq: Four times a day (QID) | RECTAL | Status: DC | PRN

## 2014-11-22 MED ORDER — ONDANSETRON HCL 4 MG PO TABS: 4.0000 mg | ORAL_TABLET | Freq: Four times a day (QID) | ORAL | Status: DC | PRN

## 2014-11-22 MED ORDER — AMLODIPINE BESYLATE 10 MG PO TABS
10.0000 mg | ORAL_TABLET | Freq: Every day | ORAL | Status: DC
  Administered 2014-11-22 – 2014-11-27 (×5): 10 mg via ORAL
  Filled 2014-11-22 (×6): qty 1

## 2014-11-22 MED ORDER — CLONIDINE HCL 0.1 MG PO TABS
0.1000 mg | ORAL_TABLET | Freq: Every day | ORAL | Status: DC
  Administered 2014-11-22 – 2014-11-26 (×5): 0.1 mg via ORAL
  Filled 2014-11-22 (×6): qty 1

## 2014-11-22 MED ORDER — ALUM & MAG HYDROXIDE-SIMETH 200-200-20 MG/5ML PO SUSP: 30.0000 mL | Freq: Four times a day (QID) | ORAL | Status: DC | PRN

## 2014-11-22 MED ORDER — SEVELAMER CARBONATE 800 MG PO TABS
1600.0000 mg | ORAL_TABLET | Freq: Three times a day (TID) | ORAL | Status: DC
  Administered 2014-11-22 – 2014-11-27 (×14): 1600 mg via ORAL
  Filled 2014-11-22 (×16): qty 2

## 2014-11-22 MED ORDER — FAMOTIDINE 20 MG PO TABS
20.0000 mg | ORAL_TABLET | Freq: Every day | ORAL | Status: DC
  Administered 2014-11-22 – 2014-11-27 (×6): 20 mg via ORAL
  Filled 2014-11-22 (×6): qty 1

## 2014-11-22 MED ORDER — LEVOTHYROXINE SODIUM 88 MCG PO TABS
88.0000 ug | ORAL_TABLET | Freq: Every day | ORAL | Status: DC
  Administered 2014-11-23 – 2014-11-27 (×5): 88 ug via ORAL
  Filled 2014-11-22 (×7): qty 1

## 2014-11-22 MED ORDER — ACETAMINOPHEN 325 MG PO TABS
650.0000 mg | ORAL_TABLET | Freq: Four times a day (QID) | ORAL | Status: DC | PRN
  Administered 2014-11-22: 650 mg via ORAL
  Filled 2014-11-22: qty 2

## 2014-11-22 MED ORDER — ISOSORBIDE MONONITRATE 20 MG PO TABS
20.0000 mg | ORAL_TABLET | Freq: Two times a day (BID) | ORAL | Status: DC
  Administered 2014-11-22 – 2014-11-27 (×11): 20 mg via ORAL
  Filled 2014-11-22 (×13): qty 1

## 2014-11-22 MED ORDER — DEXTROSE 50 % IV SOLN
1.0000 | Freq: Once | INTRAVENOUS | Status: DC
  Filled 2014-11-22: qty 50

## 2014-11-22 MED ORDER — DEXTROSE 5 % IV SOLN
2.0000 g | INTRAVENOUS | Status: DC
  Administered 2014-11-23: 2 g via INTRAVENOUS
  Filled 2014-11-22 (×2): qty 2

## 2014-11-22 MED ORDER — DEXTROSE 5 % IV SOLN: 2.0000 g | INTRAVENOUS | Status: DC

## 2014-11-22 MED ORDER — HEPARIN SODIUM (PORCINE) 5000 UNIT/ML IJ SOLN
5000.0000 [IU] | Freq: Three times a day (TID) | INTRAMUSCULAR | Status: DC
  Administered 2014-11-22 – 2014-11-27 (×14): 5000 [IU] via SUBCUTANEOUS
  Filled 2014-11-22 (×16): qty 1

## 2014-11-22 MED ORDER — ATORVASTATIN CALCIUM 20 MG PO TABS
20.0000 mg | ORAL_TABLET | Freq: Every day | ORAL | Status: DC
  Administered 2014-11-22 – 2014-11-27 (×6): 20 mg via ORAL
  Filled 2014-11-22 (×6): qty 1

## 2014-11-22 MED ORDER — HYDROCODONE-ACETAMINOPHEN 5-325 MG PO TABS
1.0000 | ORAL_TABLET | Freq: Four times a day (QID) | ORAL | Status: DC | PRN
  Administered 2014-11-23: 1 via ORAL

## 2014-11-22 MED ORDER — PRO-STAT SUGAR FREE PO LIQD
30.0000 mL | Freq: Two times a day (BID) | ORAL | Status: DC
  Administered 2014-11-22 – 2014-11-27 (×8): 30 mL via ORAL
  Filled 2014-11-22 (×11): qty 30

## 2014-11-22 MED ORDER — POLYETHYLENE GLYCOL 3350 17 G PO PACK
17.0000 g | PACK | Freq: Every day | ORAL | Status: DC | PRN
  Filled 2014-11-22: qty 1

## 2014-11-22 MED ORDER — DARBEPOETIN ALFA 100 MCG/0.5ML IJ SOSY: 80.0000 ug | PREFILLED_SYRINGE | INTRAMUSCULAR | Status: DC

## 2014-11-22 MED ORDER — TICAGRELOR 90 MG PO TABS
90.0000 mg | ORAL_TABLET | Freq: Two times a day (BID) | ORAL | Status: DC
  Administered 2014-11-22 – 2014-11-27 (×11): 90 mg via ORAL
  Filled 2014-11-22 (×12): qty 1

## 2014-11-22 MED ORDER — SENNOSIDES-DOCUSATE SODIUM 8.6-50 MG PO TABS
2.0000 | ORAL_TABLET | Freq: Two times a day (BID) | ORAL | Status: DC
  Administered 2014-11-22 – 2014-11-27 (×9): 2 via ORAL
  Filled 2014-11-22 (×12): qty 2

## 2014-11-22 MED ORDER — DOCUSATE SODIUM 100 MG PO CAPS
100.0000 mg | ORAL_CAPSULE | Freq: Two times a day (BID) | ORAL | Status: DC
  Administered 2014-11-22 – 2014-11-27 (×11): 100 mg via ORAL
  Filled 2014-11-22 (×12): qty 1

## 2014-11-22 MED ORDER — INSULIN ASPART 100 UNIT/ML ~~LOC~~ SOLN
0.0000 [IU] | Freq: Three times a day (TID) | SUBCUTANEOUS | Status: DC
  Administered 2014-11-22: 5 [IU] via SUBCUTANEOUS
  Administered 2014-11-23: 3 [IU] via SUBCUTANEOUS
  Administered 2014-11-23: 2 [IU] via SUBCUTANEOUS
  Administered 2014-11-24: 7 [IU] via SUBCUTANEOUS
  Administered 2014-11-24: 3 [IU] via SUBCUTANEOUS
  Administered 2014-11-24: 5 [IU] via SUBCUTANEOUS
  Administered 2014-11-25: 3 [IU] via SUBCUTANEOUS
  Administered 2014-11-25: 2 [IU] via SUBCUTANEOUS
  Administered 2014-11-26: 5 [IU] via SUBCUTANEOUS
  Administered 2014-11-26: 7 [IU] via SUBCUTANEOUS
  Administered 2014-11-27: 9 [IU] via SUBCUTANEOUS
  Administered 2014-11-27: 5 [IU] via SUBCUTANEOUS

## 2014-11-22 MED ORDER — ONDANSETRON HCL 4 MG/2ML IJ SOLN: 4.0000 mg | Freq: Four times a day (QID) | INTRAMUSCULAR | Status: DC | PRN

## 2014-11-22 MED ORDER — BOOST / RESOURCE BREEZE PO LIQD
1.0000 | Freq: Two times a day (BID) | ORAL | Status: DC
  Administered 2014-11-22 – 2014-11-27 (×5): 1 via ORAL

## 2014-11-22 MED ORDER — INSULIN ASPART 100 UNIT/ML ~~LOC~~ SOLN
0.0000 [IU] | Freq: Every day | SUBCUTANEOUS | Status: DC
  Administered 2014-11-25 – 2014-11-26 (×2): 2 [IU] via SUBCUTANEOUS

## 2014-11-22 MED ORDER — HYDRALAZINE HCL 50 MG PO TABS
50.0000 mg | ORAL_TABLET | Freq: Four times a day (QID) | ORAL | Status: DC
  Administered 2014-11-22 – 2014-11-27 (×10): 50 mg via ORAL
  Filled 2014-11-22 (×23): qty 1

## 2014-11-22 MED ORDER — VITAMIN C 500 MG PO TABS
500.0000 mg | ORAL_TABLET | Freq: Every day | ORAL | Status: DC
  Administered 2014-11-22 – 2014-11-27 (×6): 500 mg via ORAL
  Filled 2014-11-22 (×6): qty 1

## 2014-11-22 MED ORDER — GABAPENTIN 100 MG PO CAPS
100.0000 mg | ORAL_CAPSULE | Freq: Every day | ORAL | Status: DC
  Administered 2014-11-22 – 2014-11-26 (×5): 100 mg via ORAL
  Filled 2014-11-22 (×6): qty 1

## 2014-11-22 MED ORDER — VANCOMYCIN HCL 125 MG PO CAPS: 500.0000 mg | ORAL_CAPSULE | ORAL | Status: DC

## 2014-11-22 NOTE — Progress Notes (Addendum)
Patient ID: Nicole [name], female   DOB: [birth year], 79 y.o.   MRN: [id number]               PROGRESS NOTE  DATE:  11/20/2014    [facility]    LEVEL OF CARE:   SNF   Acute Visit   CHIEF COMPLAINT:  Review of wounds on the left lower extremity.    HISTORY OF PRESENT ILLNESS:  This is a patient whom I saw a month ago.  She has known significant PAD.  She has had previous noninvasive studies from September 2014 showing significant arterial disease with a left anterior tibial ABI of 0.58 and a posterior tibial ABI of 0.36, monophasic waveforms.  She may have had an arteriogram in the past.  The culture I did last month did not show any significant growth in the heel area.    She has known type 2 diabetes and is on dialysis Monday, Wednesday, and Friday.    In discussion with the wound care nurse today, the area on the heel has gotten worse whereas the one on her posterolateral left leg has actually gotten better.  She has a small new wound over her left ischium.    PHYSICAL EXAMINATION:   SKIN:  INSPECTION:  Left heel:  There is a wound over the heel here which is quite a bit larger than the last time I saw this.  There is a lot of liquefied, foul-smelling eschar with some surrounding erythema.  I did an extensive debridement.  However, more will need to be done here.  The possibility that this is down to bone is quite likely.  A deep culture was done.  I do not have the ability to do bone cultures here.    The wound on the left posterolateral leg actually looks some better.  It had exposed tendon last month.  There is not exposed tendon now.  I did a light selective debridement with a curette just to remove any bioburden present.  This did not need culturing and, as stated, this actually looks some better.    She has a small, dime-sized area on her left ischium.  This has a slough over the center.  The issue here, I am sure, is pressure relief.  She sits on this most of the  day in the wheelchair and certainly when she is at dialysis.  This is not infected.    ASSESSMENT/PLAN:                             Probable diabetic osteomyelitis in the left heel, or at least a significant cellulitis.  A deep C&S was done.   I will phone dialysis to see if we can get her started on IV vancomycin and some form of beta lactam.  She has no relevant allergies to antibiotics that I can see.  The heel is going to require silver alginate until we can control the infection here.  Collagen to the other two wounds with hydrogel.  All of this can be changed every second day.  The only surgery this patient would be a candidate for        Is an amputation.  .     Small stage 2 pressure ulcer left ischium. Hopefully we can provide enough pressure relief both at the facility and at dialysis to avoid further deterioration here.  I have spoken with the patient, her son on the phone. Called her nephrologist who graciously agreed to initiate antibiotics at dialysis

## 2014-11-22 NOTE — ED Notes (Signed)
Per the lab ([facility]), Sedimentation rate clotted, new sample needs to be drawn.

## 2014-11-22 NOTE — Consult Note (Signed)
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 On exam, she has evidence of multilevel arterial occlusive disease with a markedly diminished left femoral pulse and monophasic Doppler signals in the left foot. She has had this wound since late September. Given her diabetes, multilevel arterial occlusive disease, and nonhealing wounds, this is clearly a limb threatening situation. I think her only chance for limb salvage would be to proceed with arteriography to see what options she might have for revascularization. However, she was admitted with hypoglycemia and also had  Some atrial flutter reportedly in the emergency department. She also had a NSTEMI  In September of this year. Once it is safe from a medical standpoint we can schedule an arteriogram. However this does not need to be done urgently.   END-STAGE RENAL DISEASE: The patient has a weak thrill in her left basilic vein transposition. She apparently was scheduled for a fistulogram today by IR but this was canceled because of her hypoglycemia. This can be rescheduled with IR once she is medically stable.  Agree with note below. On my exam, she has a left carotid bruit. She has a palpable, but diminished, right femoral pulse. She has a barely palpable left femoral pulse. I do not see that ABIs were done and I have ordered this. She has a left carotid bruit but tells me that her carotid disease is followed in the [facility] office.  Waverly Ferrarihristopher Dickson, MD, FACS Beeper [phone] 2:36 PM  Reason for Consult: Left lateral legg ulcer and heel ulcer since early OCT 2015. Referring Physician: ED  History of Present Illness: 79 y/o female brought to the ED secondary to hypoglycemia.  We are being consulted for left LE ulcers.   Lateral calf ulcer 3x2 cm with clean edges and beefy red base.  Left calcaneus ulcer dry necrotic pressure ulcer.  She fractured her pelvis back in August and has been bed bound other than getting up to the chair with full assistance.  She is a resident of an extended care facility.  She has been seen in our office in the past for dialysis access.  She currently has a working AV fistula on the left upper arm that was placed in Jan. 2015 by Dr. Hart RochesterLawson.  Past medical history includes: hypertension treated with Carvedilol, hyperlipidemia on Lipitor and Asprin daily.     Pt meds include: Statin :Yes Betablocker: Yes ASA: Yes Other anticoagulants/antiplatelets: none   Past Medical History  Diagnosis Date  . Retinopathy   . Hypertension   . Chronic diastolic CHF (congestive heart failure)     Echo (9/15):  Mild LVH, EF 50-55%, mild MR, mod LAE  . CAD (coronary artery disease)     a.  Prior PCI in 1998 x 2; b.  s/p cath in 2001, c.  negative Myoview in 2008 & 2011, d.  s/p cath in June 2012 with calcified disease of the proximal LCX with normal flow reserve. e. NSTEMI (9/15):  LHC (9/15):  Ostial LM mild disease, mid LAD mild disease, ostial Dx mod disease, prox CFX 90%, OM1 stent ok, ostial RCA disease (non-dominant); LVEDP 24 mmHg >>> PCI:  2.25 x 16 Promus DES to CFX   . Cerebrovascular disease     a. Carotid US (8/14):  Bilateral ICA 40-59% >> repeat  1 year  . Peripheral vascular disease     prior angiography showing RSFA stenosis, left anterior tibial and left posterior tibial stenoses  . Hypercholesterolemia   . Diabetes mellitus     insulin dependent  . Hypothyroidism   . Hiatal hernia   . Degenerative joint disease   . Osteoporosis   . Vitamin D deficiency   . Anemia of chronic disease   . ESRD (end stage renal disease)     a. CKD progressed to ESRD 10/2013. b. s/p LUE basilic transposition by vascular on 11/08/13.  . Dialysis patient     [facility], and Friday  . Left acetabular  fracture 07/2014    complex - managemed medically   Past Surgical History  Procedure Laterality Date  . Abdominal aortogram  07/27/2003    by Dr. Chales Abrahams  . Bilateral lower extremity angiogram  07/27/2003    by Dr. Chales Abrahams  . Selective angiography,right superficial femoral artery, right and left iliac arteries  07/27/2003    by Dr. Chales Abrahams  . Measurement of gradient right and left iliac arteries  07/27/2003    by Dr. Chales Abrahams  . Bilateral cataract sugery  2000 and 2002    by Dr. Cecilie Kicks  . Repair left hip fracture  04/2008    by Dr. Charlann Boxer  . Bascilic vein transposition Left 11/08/2013    Procedure: BASCILIC VEIN TRANSPOSITION;  Surgeon: Pryor Ochoa, MD;  Location: [facility] OR;  Service: Vascular;  Laterality: Left;  Marland Kitchen Eye surgery      Vision loss in Right Eye  . Renal angiogram N/A 08/21/2013    Procedure: RENAL ANGIOGRAM;  Surgeon: Micheline Chapman, MD;  Location: [facility] CATH LAB;  Service: Cardiovascular;  Laterality: N/A;  . Right heart catheterization N/A 10/31/2013    Procedure: RIGHT HEART CATH;  Surgeon: Dolores Patty, MD;  Location: [facility] CATH LAB;  Service: Cardiovascular;  Laterality: N/A;  . Left heart catheterization with coronary angiogram N/A 08/20/2014    Procedure: LEFT HEART CATHETERIZATION WITH CORONARY ANGIOGRAM;  Surgeon: Corky Crafts, MD;  Location: [facility] CATH LAB;  Service: Cardiovascular;  Laterality: N/A;   Social History History  Substance Use Topics  . Smoking status: Former Smoker    Types: Cigarettes    Quit date: 12/07/1974  . Smokeless tobacco: Never Used     Comment: pt started smoking in 1956  . Alcohol Use: No   Family History Family History  Problem Relation Age of Onset  . Heart disease Mother   . Cancer Mother   . Cancer Father   . Cancer Brother    Allergies  Allergen Reactions  . Ibuprofen     REACTION: edema---thyroid problems  . Prednisone     Causes severe fluid retention  . Procrit [Epoetin Alfa]     Per Dr. Eliane Decree note: she apparently has  had an "adverse reaction" to Procrit after which she gets diffuse arthralgias and myalgias.   REVIEW OF SYSTEMS  General: [ ]  Weight loss, [ ]  Fever, [ ]  chills Neurologic: [ ]  Dizziness, [x ] Blackouts, [ ]  Seizure [ ]  Stroke, [ ]  "Mini stroke", [ ]  Slurred speech, [ ]  Temporary blindness; [x ] weakness in arms or legs, [ ]  Hoarseness [ ]  Dysphagia Cardiac: [ ]  Chest pain/pressure, [ ]  Shortness of breath at rest [ ]  Shortness of breath with exertion, [ ]  Atrial fibrillation or irregular heartbeat  Vascular: [ ]  Pain in legs with walking, [ ]  Pain in legs at rest, [ ]   Pain in legs at night,  [x ] Non-healing ulcer, [ ]  Blood clot in vein/DVT,   Pulmonary: [ ]  Home oxygen, [ ]  Productive cough, [ ]  Coughing up blood, [ ]  Asthma,  [ ]  Wheezing [ ]  COPD Musculoskeletal:  [ ]  Arthritis, [ ]  Low back pain, [ ]  Joint pain Hematologic: [ ]  Easy Bruising, [ ]  Anemia; [ ]  Hepatitis Gastrointestinal: [ ]  Blood in stool, [ ]  Gastroesophageal Reflux/heartburn, Urinary: [ ]  chronic Kidney disease, [x ] on HD - [x ] MWF or [ ]  TTHS, [ ]  Burning with urination, [ ]  Difficulty urinating Skin: [ ]  Rashes, [x ] Wounds Psychological: [ ]  Anxiety, [ ]  Depression  Physical Examination Filed Vitals:   11/22/14 1200 11/22/14 1215 11/22/14 1230 11/22/14 1300  BP: 150/67 169/61 165/46 168/50  Pulse: 59 62 71 66  Resp:      SpO2: 100% 99% 100% 100%   There is no weight on file to calculate BMI.  General:   NAD, non ambulatory currently HENT: WNL Eyes: Pupils equal Pulmonary: normal non-labored breathing , without Rales, rhonchi,  wheezing Cardiac: RRR, without  Murmurs, rubs or gallops; No carotid bruits Abdomen: soft, NT, no masses Skin: no rashes, Left lateral calf ulcer 3x2 cm with clean edges and beefy red base.  Left calcaneus ulcer dry necrotic pressure ulcer.  Vascular Exam/Pulses:Palpable radial, femoral, and popliteal pulses bilateral.  Dopller PT/DP bilateral monophasic. Musculoskeletal:  positive muscle wasting or atrophy; no edema  Neurologic: A&O X 3; Appropriate Affect ;  SENSATION: normal; MOTOR FUNCTION: 5/5 Symmetric Speech is fluent/normal  Significant Diagnostic Studies: CBC Lab Results  Component Value Date   WBC 9.3 11/22/2014   HGB 11.3* 11/22/2014   HCT 35.8* 11/22/2014   MCV 101.7* 11/22/2014   PLT 165 11/22/2014   BMET    Component Value Date/Time   NA 138 11/22/2014 0800   NA 131* 08/08/2014   K 3.9 11/22/2014 0800   CL 98 11/22/2014 0800   CO2 26 11/22/2014 0800   GLUCOSE 83 11/22/2014 0800   BUN 17 11/22/2014 0800   BUN 44* 08/08/2014   CREATININE 2.55* 11/22/2014 0800   CREATININE 3.8* 08/08/2014   CALCIUM 8.9 11/22/2014 0800   CALCIUM 8.1* 11/07/2013 1320   GFRNONAA 17* 11/22/2014 0800   GFRAA 20* 11/22/2014 0800   CrCl cannot be calculated (Unknown ideal weight.).  COAG Lab Results  Component Value Date   INR 0.93 11/22/2014   INR 1.01 07/22/2014   INR 1.03 11/07/2013   Non-Invasive Vascular Imaging: None todate  ASSESSMENT/PLAN:   PAD doppler signals bilateral.  She has no history of ulcers until her injury occurred in August and she was bed ridden.  There is a mention of an angiogram that showed stenosis in left anterior tibial and posterior tibial arteries, as well as right SFA stenosis performed by Dr. Chales AbrahamsGupta.  Date unknown.  I was unable to view the report or study personally.  She may benefit from ABI's and another angiogram with bilateral runoff to see if she has sufficient  blood flow to heal the ulcers or if she is a candidate for revascularization.  Thomasena EdisCOLLINS, EMMA MAUREEN 11/22/2014 1:40 PM

## 2014-11-22 NOTE — ED Notes (Signed)
Dr. Littie DeedsGentry at bedside with ultrasound.

## 2014-11-22 NOTE — ED Notes (Signed)
Patient given oral orange juice.

## 2014-11-22 NOTE — ED Notes (Signed)
Pt arrives via EMS from [facility], found by nursing staff face down on floor with snoring respirations, upon EMS arrival CBG 33. Amp of D50 given, repeat cbg 200. C-collar in place. Responsive to deep painful stimulation, intermittent A flutter on the monitor. EMS unsure of baseline mental status.

## 2014-11-22 NOTE — ED Provider Notes (Signed)
CSN: [id number]     Arrival date & time 11/22/14  7829 History   First MD Initiated Contact with Patient 11/22/14 [phone]     Chief Complaint  Patient presents with  . Altered Mental Status     (Consider location/radiation/quality/duration/timing/severity/associated sxs/prior Treatment) Patient is a 79 y.o. female presenting with altered mental status.  Altered Mental Status Presenting symptoms: unresponsiveness   Severity:  Severe Most recent episode:  Today Episode history:  Unable to specify Duration: found down. Timing:  Constant Progression:  Worsening Chronicity:  Recurrent Context comment:  Cbg 30 Associated symptoms: no abdominal pain, no fever and no vomiting     Past Medical History  Diagnosis Date  . Retinopathy   . Hypertension   . Chronic diastolic CHF (congestive heart failure)     Echo (9/15):  Mild LVH, EF 50-55%, mild MR, mod LAE  . CAD (coronary artery disease)     a.  Prior PCI in 1998 x 2; b.  s/p cath in 2001, c.  negative Myoview in 2008 & 2011, d.  s/p cath in June 2012 with calcified disease of the proximal LCX with normal flow reserve. e. NSTEMI (9/15):  LHC (9/15):  Ostial LM mild disease, mid LAD mild disease, ostial Dx mod disease, prox CFX 90%, OM1 stent ok, ostial RCA disease (non-dominant); LVEDP 24 mmHg >>> PCI:  2.25 x 16 Promus DES to CFX   . Cerebrovascular disease     a. Carotid US (8/14):  Bilateral ICA 40-59% >> repeat 1 year  . Peripheral vascular disease     prior angiography showing RSFA stenosis, left anterior tibial and left posterior tibial stenoses  . Hypercholesterolemia   . Diabetes mellitus     insulin dependent  . Hypothyroidism   . Hiatal hernia   . Degenerative joint disease   . Osteoporosis   . Vitamin D deficiency   . Anemia of chronic disease   . ESRD (end stage renal disease)     a. CKD progressed to ESRD 10/2013. b. s/p LUE basilic transposition by vascular on 11/08/13.  . Dialysis patient     [facility], and Friday   . Left acetabular fracture 07/2014    complex - managemed medically  . Myocardial infarction   . Depression    Past Surgical History  Procedure Laterality Date  . Abdominal aortogram  07/27/2003    by Dr. Chales Abrahams  . Bilateral lower extremity angiogram  07/27/2003    by Dr. Chales Abrahams  . Selective angiography,right superficial femoral artery, right and left iliac arteries  07/27/2003    by Dr. Chales Abrahams  . Measurement of gradient right and left iliac arteries  07/27/2003    by Dr. Chales Abrahams  . Bilateral cataract sugery  2000 and 2002    by Dr. Cecilie Kicks  . Repair left hip fracture  04/2008    by Dr. Charlann Boxer  . Bascilic vein transposition Left 11/08/2013    Procedure: BASCILIC VEIN TRANSPOSITION;  Surgeon: Pryor Ochoa, MD;  Location: [facility] OR;  Service: Vascular;  Laterality: Left;  Marland Kitchen Eye surgery      Vision loss in Right Eye  . Renal angiogram N/A 08/21/2013    Procedure: RENAL ANGIOGRAM;  Surgeon: Micheline Chapman, MD;  Location: [facility] CATH LAB;  Service: Cardiovascular;  Laterality: N/A;  . Right heart catheterization N/A 10/31/2013    Procedure: RIGHT HEART CATH;  Surgeon: Dolores Patty, MD;  Location: [facility] CATH LAB;  Service: Cardiovascular;  Laterality: N/A;  . Left  heart catheterization with coronary angiogram N/A 08/20/2014    Procedure: LEFT HEART CATHETERIZATION WITH CORONARY ANGIOGRAM;  Surgeon: Corky CraftsJayadeep S Varanasi, MD;  [facility];  Service: Cardiovascular;  Laterality: N/A;  . Vitrectomy     Family History  Problem Relation Age of Onset  . Heart disease Mother   . Cancer Mother   . Cancer Father   . Cancer Brother    History  Substance Use Topics  . Smoking status: Former Smoker    Types: Cigarettes    Quit date: 12/07/1974  . Smokeless tobacco: Never Used     Comment: pt started smoking in 1956  . Alcohol Use: No   OB History    No data available     Review of Systems  Constitutional: Negative for fever.  Gastrointestinal: Negative for vomiting and abdominal pain.   All other systems reviewed and are negative.     Allergies  Ibuprofen; Prednisone; and Procrit  Home Medications   Prior to Admission medications   Medication Sig Start Date End Date Taking? Authorizing Provider  amLODipine (NORVASC) 10 MG tablet Take 1 tablet (10 mg total) by mouth daily. 05/24/14  Yes Lars MassonKatarina H Nelson, MD  aspirin EC 81 MG tablet Take 81 mg by mouth daily.   Yes Historical Provider, MD  atorvastatin (LIPITOR) 20 MG tablet Take 1 tablet (20 mg total) by mouth daily at 6 PM. 05/24/14  Yes Lars MassonKatarina H Nelson, MD  carvedilol (COREG) 12.5 MG tablet Take 1 tablet (12.5 mg total) by mouth 2 (two) times daily with a meal. 03/08/14  Yes Lars MassonKatarina H Nelson, MD  ceFEPIme 2 g in dextrose 5 % 50 mL Inject 2 g into the vein See admin instructions. For next 6 dialysis treatments   Yes Historical Provider, MD  Cholecalciferol (VITAMIN D) 1000 UNITS capsule Take 1,000 Units by mouth daily.    Yes Historical Provider, MD  clindamycin (CLEOCIN) 300 MG capsule Take 300 mg by mouth 4 (four) times daily. For foot ulcer   Yes Historical Provider, MD  cloNIDine (CATAPRES) 0.1 MG tablet Take 0.1 mg by mouth daily at 10 pm.  09/14/13  Yes Lars MassonKatarina H Nelson, MD  docusate sodium 100 MG CAPS Take 100 mg by mouth 2 (two) times daily. 07/26/14  Yes Elease EtienneAnand D Hongalgi, MD  famotidine (PEPCID) 20 MG tablet Take 20 mg by mouth daily.   Yes Historical Provider, MD  gabapentin (NEURONTIN) 100 MG capsule Take 100 mg by mouth at bedtime.   Yes Historical Provider, MD  HEPARIN SODIUM, BOVINE, IJ Inject 5,000 Units as directed 3 (three) times daily.   Yes Historical Provider, MD  hydrALAZINE (APRESOLINE) 50 MG tablet Take 50 mg by mouth every 6 (six) hours.   Yes Historical Provider, MD  insulin glargine (LANTUS) 100 UNIT/ML injection Inject 0.12 mLs (12 Units total) into the skin daily. 08/25/14  Yes Jerald KiefStephen K Chiu, MD  isosorbide mononitrate (ISMO,MONOKET) 20 MG tablet Take 1 tablet (20 mg total) by mouth 2 (two)  times daily. 03/08/14  Yes Lars MassonKatarina H Nelson, MD  levothyroxine (SYNTHROID, LEVOTHROID) 88 MCG tablet Take 88 mcg by mouth daily before breakfast.   Yes Historical Provider, MD  Probiotic Product (PROBIOTIC DAILY PO) Take 1 capsule by mouth 2 (two) times daily.   Yes Historical Provider, MD  senna-docusate (SENOKOT-S) 8.6-50 MG per tablet Take 2 tablets by mouth 2 (two) times daily. 07/26/14  Yes Elease EtienneAnand D Hongalgi, MD  sevelamer (RENAGEL) 800 MG tablet Take 1,600 mg by  mouth 3 (three) times daily with meals.   Yes Historical Provider, MD  ticagrelor (BRILINTA) 90 MG TABS tablet Take 1 tablet (90 mg total) by mouth 2 (two) times daily. 08/25/14  Yes Jerald KiefStephen K Chiu, MD  vancomycin 500 mg in sodium chloride 0.9 % 100 mL Inject 500 mg into the vein every dialysis. For the next 5 dialysis sessions [First dose 11/21/14]   Yes Historical Provider, MD  vitamin C (ASCORBIC ACID) 500 MG tablet Take 500 mg by mouth daily.   Yes Historical Provider, MD  acetaminophen (TYLENOL) 650 MG CR tablet Take 650 mg by mouth every 8 (eight) hours as needed for pain.    Historical Provider, MD  HYDROcodone-acetaminophen (NORCO/VICODIN) 5-325 MG per tablet Take 1-2 tablets by mouth every 6 (six) hours as needed for moderate pain. 08/27/14   Tiffany L Reed, DO  insulin aspart (NOVOLOG) 100 UNIT/ML injection Inject 0-9 Units into the skin 3 (three) times daily with meals. CBG < 70: implement hypoglycemia protocol CBG 70 - 120: 0 units CBG 121 - 150: 1 unit CBG 151 - 200: 2 units CBG 201 - 250: 3 units CBG 251 - 300: 5 units CBG 301 - 350: 7 units CBG 351 - 400: 9 units CBG > 400: call MD. 07/26/14   Elease EtienneAnand D Hongalgi, MD  insulin aspart (NOVOLOG) 100 UNIT/ML injection Inject 5 Units into the skin 2 (two) times daily. Before breakfast and before lunch 07/26/14   Elease EtienneAnand D Hongalgi, MD  ondansetron (ZOFRAN) 4 MG tablet Take 4 mg by mouth every 6 (six) hours as needed for nausea or vomiting.    Historical Provider, MD   BP 139/49 mmHg   Pulse 61  Temp(Src) 98.2 F (36.8 C) (Oral)  Resp 14  Ht 5' 4.17" (1.63 m)  Wt 100 lb 8.5 oz (45.6 kg)  BMI 17.16 kg/m2  SpO2 99% Physical Exam  Constitutional: She appears well-developed and well-nourished.  HENT:  Head: Normocephalic and atraumatic.  Right Ear: External ear normal.  Left Ear: External ear normal.  Eyes: Conjunctivae and EOM are normal. Pupils are equal, round, and reactive to light.  Neck: Normal range of motion. Neck supple.  Cardiovascular: Normal rate, regular rhythm, normal heart sounds and intact distal pulses.   Pulmonary/Chest: Effort normal and breath sounds normal.  Abdominal: Soft. Bowel sounds are normal. There is no tenderness.  Musculoskeletal: Normal range of motion.  Neurological: GCS eye subscore is 3. GCS verbal subscore is 3. GCS motor subscore is 5.  Pt not responding to commands  Skin: Skin is warm and dry.  Decubitus necrotic ulcer over L heel with healing ulceration of L Lateral calf  Vitals reviewed.   ED Course  Procedures (including critical care time) Labs Review Labs Reviewed  CBC - Abnormal; Notable for the following:    RBC 3.52 (*)    Hemoglobin 11.3 (*)    HCT 35.8 (*)    MCV 101.7 (*)    All other components within normal limits  COMPREHENSIVE METABOLIC PANEL - Abnormal; Notable for the following:    Creatinine, Ser 2.55 (*)    Albumin 2.5 (*)    Alkaline Phosphatase 233 (*)    GFR calc non Af Amer 17 (*)    GFR calc Af Amer 20 (*)    All other components within normal limits  URINALYSIS, ROUTINE W REFLEX MICROSCOPIC - Abnormal; Notable for the following:    APPearance TURBID (*)    Hgb urine dipstick MODERATE (*)  Ketones, ur 15 (*)    Protein, ur >300 (*)    Nitrite POSITIVE (*)    Leukocytes, UA LARGE (*)    All other components within normal limits  URINE MICROSCOPIC-ADD ON - Abnormal; Notable for the following:    Squamous Epithelial / LPF FEW (*)    Bacteria, UA FEW (*)    All other components within  normal limits  SEDIMENTATION RATE - Abnormal; Notable for the following:    Sed Rate 54 (*)    All other components within normal limits  GLUCOSE, CAPILLARY - Abnormal; Notable for the following:    Glucose-Capillary 143 (*)    All other components within normal limits  GLUCOSE, CAPILLARY - Abnormal; Notable for the following:    Glucose-Capillary 273 (*)    All other components within normal limits  GLUCOSE, CAPILLARY - Abnormal; Notable for the following:    Glucose-Capillary 367 (*)    All other components within normal limits  GLUCOSE, CAPILLARY - Abnormal; Notable for the following:    Glucose-Capillary 528 (*)    All other components within normal limits  GLUCOSE, CAPILLARY - Abnormal; Notable for the following:    Glucose-Capillary 215 (*)    All other components within normal limits  CBG MONITORING, ED - Abnormal; Notable for the following:    Glucose-Capillary 115 (*)    All other components within normal limits  I-STAT VENOUS BLOOD GAS, ED - Abnormal; Notable for the following:    pH, Ven 7.448 (*)    pCO2, Ven 44.5 (*)    pO2, Ven 153.0 (*)    Bicarbonate 30.8 (*)    Acid-Base Excess 6.0 (*)    All other components within normal limits  CBG MONITORING, ED - Abnormal; Notable for the following:    Glucose-Capillary 64 (*)    All other components within normal limits  CBG MONITORING, ED - Abnormal; Notable for the following:    Glucose-Capillary 100 (*)    All other components within normal limits  CBG MONITORING, ED - Abnormal; Notable for the following:    Glucose-Capillary 117 (*)    All other components within normal limits  URINE CULTURE  PROTIME-INR  TROPONIN I  I-STAT CG4 LACTIC ACID, ED  CBG MONITORING, ED    Imaging Review Dg Chest 1 View  11/22/2014   CLINICAL DATA:  Altered mental status  EXAM: CHEST - 1 VIEW  COMPARISON:  August 20, 2014  FINDINGS: There is no edema or consolidation. Heart is mildly enlarged with pulmonary vascularity within  normal limits. There is atherosclerotic change throughout the aorta. No adenopathy. There is degenerative change in the thoracic spine. Bones are osteoporotic.  IMPRESSION: No edema or consolidation.  Heart prominent but stable.   Electronically Signed   By: Bretta Bang M.D.   On: 11/22/2014 07:51   Dg Pelvis 1-2 Views  11/22/2014   CLINICAL DATA:  79 year old female with left pelvic fractures.  EXAM: PELVIS - 1-2 VIEW  COMPARISON:  07/23/2014.  FINDINGS: Previously demonstrated fractures of the left superior and inferior pubic rami appear to be healing. There continues to be a displaced comminuted fracture of the left acetabulum, better demonstrated on prior CT scan 07/23/2014. Fracture lines appear slightly blurred, suggesting some healing, however, there are still multiple clearly define fracture lines evident. Left femoral head again projects within the fractured acetabulum, with some medial displacement, similar to the prior examination. No new acute displaced fracture identified. Three fixation screws again are noted in the left  femoral neck, with old healed left femoral neck fracture.  IMPRESSION: 1. The appearance of the bony pelvis is very similar to prior study 07/23/2014, with a small amount of bony healing in the displaced comminuted left acetabular fracture, as well as the left inferior and superior pubic rami fractures, as above.   Electronically Signed   By: Trudie Reed M.D.   On: 11/22/2014 11:36   Ct Head Wo Contrast  11/22/2014   CLINICAL DATA:  Found on floor.  Decreased responsiveness.  ESRD.  EXAM: CT HEAD WITHOUT CONTRAST  CT CERVICAL SPINE WITHOUT CONTRAST  TECHNIQUE: Multidetector CT imaging of the head and cervical spine was performed following the standard protocol without intravenous contrast. Multiplanar CT image reconstructions of the cervical spine were also generated.  COMPARISON:  07/22/2014  FINDINGS: CT HEAD FINDINGS  Ventricle size is within normal limits.  Mild  atrophy is stable.  Negative for acute infarct.  Negative for hemorrhage or mass.  Negative for skull fracture. Hyperdense vitreous in the right globe consistent with prior surgical procedure, unchanged from the prior study.  CT CERVICAL SPINE FINDINGS  Cervical kyphosis. Advanced multilevel disc degeneration and spondylosis. Mild facet degeneration diffusely.  Negative for fracture or mass lesion.  IMPRESSION: No acute intracranial abnormality.  Advanced cervical degenerative change.  Negative for fracture   Electronically Signed   By: Marlan Palau M.D.   On: 11/22/2014 07:52   Ct Cervical Spine Wo Contrast  11/22/2014   CLINICAL DATA:  Found on floor.  Decreased responsiveness.  ESRD.  EXAM: CT HEAD WITHOUT CONTRAST  CT CERVICAL SPINE WITHOUT CONTRAST  TECHNIQUE: Multidetector CT imaging of the head and cervical spine was performed following the standard protocol without intravenous contrast. Multiplanar CT image reconstructions of the cervical spine were also generated.  COMPARISON:  07/22/2014  FINDINGS: CT HEAD FINDINGS  Ventricle size is within normal limits.  Mild atrophy is stable.  Negative for acute infarct.  Negative for hemorrhage or mass.  Negative for skull fracture. Hyperdense vitreous in the right globe consistent with prior surgical procedure, unchanged from the prior study.  CT CERVICAL SPINE FINDINGS  Cervical kyphosis. Advanced multilevel disc degeneration and spondylosis. Mild facet degeneration diffusely.  Negative for fracture or mass lesion.  IMPRESSION: No acute intracranial abnormality.  Advanced cervical degenerative change.  Negative for fracture   Electronically Signed   By: Marlan Palau M.D.   On: 11/22/2014 07:52   Dg Foot Complete Left  11/22/2014   CLINICAL DATA:  Patient recently fell.  Currently with foot ulcer  EXAM: LEFT FOOT - COMPLETE 3+ VIEW  COMPARISON:  None.  FINDINGS: Frontal, oblique, and lateral views were obtained. Bones are diffusely osteoporotic. There is  a tiny calcification just medial to the navicular on the oblique view which may represent a small avulsion type injury. No other evidence of potential fracture. No dislocation. There is no erosive change or bony destruction. There is narrowing of all PIP and DIP joints.  There is pes cavus.  There are scattered foci of arterial vascular calcification. No soft tissue abscess is appreciable.  IMPRESSION: Question tiny avulsion along the medial navicular. No erosive change or bony destruction. Vascular calcification noted at several sites. Pes cavus present. Bones osteoporotic.   Electronically Signed   By: Bretta Bang M.D.   On: 11/22/2014 11:27     EKG Interpretation   Date/Time:  Thursday November 22 2014 06:51:52 EST Ventricular Rate:  50 PR Interval:  168 QRS Duration: 93 QT  Interval:  555 QTC Calculation: 506 R Axis:   18 Text Interpretation:  Sinus rhythm Borderline low voltage, extremity leads  Prolonged QT interval Confirmed by Mirian Mo [phone]) on 11/22/2014  7:12:09 AM      MDM   Final diagnoses:  Foot ulcer, left  Fall    79 y.o. female with pertinent PMH of DM, ESRD (MWF), CHF, CAD presents with altered mental status.  Seen normal last night, found down this morning at the nursing facility. On EMS arrival patient found to have a glucose of 30, after an amp of D50 this responded appropriately and the patient's mental status improved, however did not return to baseline.  She had no objective signs of trauma, however does not walk at baseline. On discussion with the patient's son, at baseline the patient is "sharp as a tack".  On arrival today the patient vital signs and physical exam as above.  She would respond to verbal stimuli by her son, but would not verbally answer questions or follow commands.    WU returned as above.  Pt had gradual improvement without true return to baseline (maintained fatigue, lethargy).  Suspect symptoms likely due to hypoglycemia, however  cannot ro infection as etiology of symptoms, and pt has concomitant UTI.  Admitted in stable condition.  I have reviewed all laboratory and imaging studies if ordered as above  1. Altered mental status   2. Foot ulcer, left   3. Nicholaus Bloom, MD 11/23/14 [phone]

## 2014-11-22 NOTE — Progress Notes (Signed)
INITIAL NUTRITION ASSESSMENT  Pt meets criteria for SEVERE MALNUTRITION in the context of chronic illness as evidenced by a 9% weight loss in 3 months and severe fat and muscle mass loss.  DOCUMENTATION CODES Per approved criteria  -Severe malnutrition in the context of chronic illness -Underweight   INTERVENTION: Provide Resource Breeze po BID, each supplement provides 250 kcal and 9 grams of protein.  Provide 30 ml Prostat po BID, each supplement provides 100 kcal and 15 grams of protein.  Encourage adequate PO intake.  NUTRITION DIAGNOSIS: Increased nutrient needs related to chronic illness, ESRD as evidenced by estimated nutrition needs.   Goal: Pt to meet >/= 90% of their estimated nutrition needs   Monitor:  PO intake, weight trends, labs, I/O's  Reason for Assessment: MST  79 y.o. female  Admitting Dx: Hypoglycemia  ASSESSMENT: Pt with end-stage renal disease on hemodialysis, diabetes mellitus insulin requiring, chronic diastolic heart failure, acute MI in September 2015 status post stent to the left circumflex, lower extremity wound, hypothyroidism, peripheral artery disease and hyperlipidemia. Patient [facility] being found unresponsive on the floor with snoring respirations and a CBG of 33. She tells me that she has been eating poorly.  Pt reports having a decreased appetite which has been ongoing over the past month. Pt is a resident of [facility]. She reports she does not like the food there and thus has only been eating soup most of the time, however has still been having 3 meals a day. Pt does take oral supplements. She drinks Boost occasionally and especially when her blood sugar is low. She also reports taking Prostat once daily. Family reports they brought in a hamburger earlier and pt ate 100% of it. Pt reports having weight loss. Noted pt with a 9% weight loss in 3 months. RD to order Prostat for added protein. Pt reports she would also like  [employer] Breeze as she has liked them. Will order. Pt was encouraged to eat her food at meals and to take her supplements.  Nutrition Focused Physical Exam:  Subcutaneous Fat:  Orbital Region: N/A Upper Arm Region: Severe depletion Thoracic and Lumbar Region: N/A  Muscle:  Temple Region: Moderate depletion Clavicle Bone Region: Severe depletion Clavicle and Acromion Bone Region: Severe depletion Scapular Bone Region: N/A Dorsal Hand: Moderate depletion Patellar Region: Moderate depletion Anterior Thigh Region: Moderate depletion Posterior Calf Region: Moderate to severe depletion  Edema: none  Height: Ht Readings from Last 1 Encounters:  11/22/14 5' 4.17" (1.63 m)    Weight: Wt Readings from Last 1 Encounters:  11/22/14 99 lb 3.2 oz (44.997 kg)    Ideal Body Weight: 120 lbs  % Ideal Body Weight: 83%  Wt Readings from Last 10 Encounters:  11/22/14 99 lb 3.2 oz (44.997 kg)  08/25/14 109 lb 5.6 oz (49.6 kg)  07/25/14 97 lb 3.6 oz (44.101 kg)  05/22/14 109 lb 12.8 oz (49.805 kg)  12/26/13 122 lb 9.6 oz (55.611 kg)  11/10/13 129 lb 10.1 oz (58.8 kg)  10/17/13 146 lb (66.225 kg)  10/02/13 140 lb (63.504 kg)  08/31/13 128 lb (58.06 kg)  08/22/13 130 lb 11.7 oz (59.3 kg)    Usual Body Weight: 110 lbs  % Usual Body Weight: 90%  BMI:  Body mass index is 16.94 kg/(m^2). Underweight  Estimated Nutritional Needs: Kcal: 1600-1800 Protein: 75-85 grams Fluid: 1.2 L/day  Skin: wound on left calf and heel  Diet Order: Diet renal W/128900mL fluid restriction  EDUCATION NEEDS: -  No education needs identified at this time   Intake/Output Summary (Last 24 hours) at 11/22/14 1532 Last data filed at 11/22/14 1443  Gross per 24 hour  Intake    120 ml  Output     13 ml  Net    107 ml    Last BM: PTA  Labs:   Recent Labs Lab 11/22/14 0800  NA 138  K 3.9  CL 98  CO2 26  BUN 17  CREATININE 2.55*  CALCIUM 8.9  GLUCOSE 83    CBG (last 3)   Recent Labs   11/22/14 1125 11/22/14 1326 11/22/14 1426  GLUCAP 100* 117* 143*    Scheduled Meds: . amLODipine  10 mg Oral Daily  . aspirin EC  81 mg Oral Daily  . atorvastatin  20 mg Oral q1800  . carvedilol  12.5 mg Oral BID WC  . cloNIDine  0.1 mg Oral Q2200  . [START ON 11/28/2014] darbepoetin (ARANESP) injection - DIALYSIS  80 mcg Intravenous Q Wed-HD  . dextrose  1 ampule Intravenous Once  . docusate sodium  100 mg Oral BID  . famotidine  20 mg Oral Daily  . gabapentin  100 mg Oral QHS  . heparin  5,000 Units Subcutaneous 3 times per day  . hydrALAZINE  50 mg Oral 4 times per day  . insulin aspart  0-5 Units Subcutaneous QHS  . insulin aspart  0-9 Units Subcutaneous TID WC  . isosorbide mononitrate  20 mg Oral BID  . [START ON 11/23/2014] levothyroxine  88 mcg Oral QAC breakfast  . senna-docusate  2 tablet Oral BID  . sevelamer carbonate  1,600 mg Oral TID WC  . ticagrelor  90 mg Oral BID  . vitamin C  500 mg Oral Daily    Continuous Infusions:   Past Medical History  Diagnosis Date  . Retinopathy   . Hypertension   . Chronic diastolic CHF (congestive heart failure)     Echo (9/15):  Mild LVH, EF 50-55%, mild MR, mod LAE  . CAD (coronary artery disease)     a.  Prior PCI in 1998 x 2; b.  s/p cath in 2001, c.  negative Myoview in 2008 & 2011, d.  s/p cath in June 2012 with calcified disease of the proximal LCX with normal flow reserve. e. NSTEMI (9/15):  LHC (9/15):  Ostial LM mild disease, mid LAD mild disease, ostial Dx mod disease, prox CFX 90%, OM1 stent ok, ostial RCA disease (non-dominant); LVEDP 24 mmHg >>> PCI:  2.25 x 16 Promus DES to CFX   . Cerebrovascular disease     a. Carotid US (8/14):  Bilateral ICA 40-59% >> repeat 1 year  . Peripheral vascular disease     prior angiography showing RSFA stenosis, left anterior tibial and left posterior tibial stenoses  . Hypercholesterolemia   . Diabetes mellitus     insulin dependent  . Hypothyroidism   . Hiatal hernia   .  Degenerative joint disease   . Osteoporosis   . Vitamin D deficiency   . Anemia of chronic disease   . ESRD (end stage renal disease)     a. CKD progressed to ESRD 10/2013. b. s/p LUE basilic transposition by vascular on 11/08/13.  . Dialysis patient     Paulo Fruit, and Friday  . Left acetabular fracture 07/2014    complex - managemed medically  . Myocardial infarction   . Depression     Past Surgical History  Procedure Laterality Date  .  Abdominal aortogram  07/27/2003    by Dr. Chales AbrahamsGupta  . Bilateral lower extremity angiogram  07/27/2003    by Dr. Chales AbrahamsGupta  . Selective angiography,right superficial femoral artery, right and left iliac arteries  07/27/2003    by Dr. Chales AbrahamsGupta  . Measurement of gradient right and left iliac arteries  07/27/2003    by Dr. Chales AbrahamsGupta  . Bilateral cataract sugery  2000 and 2002    by Dr. Cecilie KicksJacklin  . Repair left hip fracture  04/2008    by Dr. Charlann Boxerlin  . Bascilic vein transposition Left 11/08/2013    Procedure: BASCILIC VEIN TRANSPOSITION;  Surgeon: Pryor OchoaJames D Lawson, MD;  [facility];  Service: Vascular;  Laterality: Left;  Marland Kitchen. Eye surgery      Vision loss in Right Eye  . Renal angiogram N/A 08/21/2013    Procedure: RENAL ANGIOGRAM;  Surgeon: Micheline ChapmanMichael D Cooper, MD;  [facility];  Service: Cardiovascular;  Laterality: N/A;  . Right heart catheterization N/A 10/31/2013    Procedure: RIGHT HEART CATH;  Surgeon: Dolores Pattyaniel R Bensimhon, MD;  [facility];  Service: Cardiovascular;  Laterality: N/A;  . Left heart catheterization with coronary angiogram N/A 08/20/2014    Procedure: LEFT HEART CATHETERIZATION WITH CORONARY ANGIOGRAM;  Surgeon: Corky CraftsJayadeep S Varanasi, MD;  [facility];  Service: Cardiovascular;  Laterality: N/A;  . Vitrectomy      Marijean NiemannStephanie La, MS, RD, LDN Pager # [phone] After hours/ weekend pager # [phone]

## 2014-11-22 NOTE — ED Notes (Signed)
Patient has a decubitus ulcer to the bottom of the left heal to the bone, ulcer to the left calf and sacrum.

## 2014-11-22 NOTE — Consult Note (Signed)
Renal Service Consult Note Nicole [name]  Nicole [name] 11/22/2014 [name],[name] Requesting Physician:  Dr Nicole [name]  Reason for Consult:  ESRD pt being admitted for progressive leg wounds HPI: The patient is a 79 y.o. year-old with long hx of DM since age 79, HTN, retinopathy, diast HF, CAD with hx stents and ESRD on HD for about 1 year.  She presented to ED today sent from SNF due to LOC.  BS on arrival was 33 and now MS improved after rx of hypoglycemia.  She has deep wounds to R heel and R calf and has been undergoing OP w/u but patient now being admitted due to severity of wounds and severe hypoglycemic episode.   No current complaitns, no problems with HD. Has LUA AVF   ROS  no cp, sob, no n/v/d  no abd pain  no skin rash  Past Medical History  Past Medical History  Diagnosis Date  . Retinopathy   . Hypertension   . Chronic diastolic CHF (congestive heart failure)     Echo (9/15):  Mild LVH, EF 50-55%, mild MR, mod LAE  . CAD (coronary artery disease)     a.  Prior PCI in 1998 x 2; b.  s/p cath in 2001, c.  negative Myoview in 2008 & 2011, d.  s/p cath in June 2012 with calcified disease of the proximal LCX with normal flow reserve. e. NSTEMI (9/15):  LHC (9/15):  Ostial LM mild disease, mid LAD mild disease, ostial Dx mod disease, prox CFX 90%, OM1 stent ok, ostial RCA disease (non-dominant); LVEDP 24 mmHg >>> PCI:  2.25 x 16 Promus DES to CFX   . Cerebrovascular disease     a. Carotid US (8/14):  Bilateral ICA 40-59% >> repeat 1 year  . Peripheral vascular disease     prior angiography showing RSFA stenosis, left anterior tibial and left posterior tibial stenoses  . Hypercholesterolemia   . Diabetes mellitus     insulin dependent  . Hypothyroidism   . Hiatal hernia   . Degenerative joint disease   . Osteoporosis   . Vitamin D deficiency   . Anemia of chronic disease   . ESRD (end stage renal disease)     a. CKD progressed to ESRD 10/2013. b. s/p  LUE basilic transposition by vascular on 11/08/13.  . Dialysis patient     Nicole [name], Wed, and Friday  . Left acetabular fracture 07/2014    complex - managemed medically   Past Surgical History  Past Surgical History  Procedure Laterality Date  . Abdominal aortogram  07/27/2003    by Dr. Chales AbrahamsGupta  . Bilateral lower extremity angiogram  07/27/2003    by Dr. Chales AbrahamsGupta  . Selective angiography,right superficial femoral artery, right and left iliac arteries  07/27/2003    by Dr. Chales AbrahamsGupta  . Measurement of gradient right and left iliac arteries  07/27/2003    by Dr. Chales AbrahamsGupta  . Bilateral cataract sugery  2000 and 2002    by Dr. Cecilie KicksJacklin  . Repair left hip fracture  04/2008    by Dr. Charlann Boxerlin  . Bascilic vein transposition Left 11/08/2013    Procedure: BASCILIC VEIN TRANSPOSITION;  Surgeon: Pryor OchoaJames D Lawson, MD;  [facility];  Service: Vascular;  Laterality: Left;  Marland Kitchen. Eye surgery      Vision loss in Right Eye  . Renal angiogram N/A 08/21/2013    Procedure: RENAL ANGIOGRAM;  Surgeon: Micheline ChapmanMichael D Cooper, MD;  [facility];  Service:  Cardiovascular;  Laterality: N/A;  . Right heart catheterization N/A 10/31/2013    Procedure: RIGHT HEART CATH;  Surgeon: Dolores Patty, MD;  Location: [facility] CATH LAB;  Service: Cardiovascular;  Laterality: N/A;  . Left heart catheterization with coronary angiogram N/A 08/20/2014    Procedure: LEFT HEART CATHETERIZATION WITH CORONARY ANGIOGRAM;  Surgeon: Corky Crafts, MD;  Location: [facility] CATH LAB;  Service: Cardiovascular;  Laterality: N/A;   Family History  Family History  Problem Relation Age of Onset  . Heart disease Mother   . Cancer Mother   . Cancer Father   . Cancer Brother    Social History  reports that she quit smoking about 39 years ago. Her smoking use included Cigarettes. She smoked 0.00 packs per day. She has never used smokeless tobacco. She reports that she does not drink alcohol or use illicit drugs. Allergies  Allergies  Allergen Reactions  . Ibuprofen      REACTION: edema---thyroid problems  . Prednisone     Causes severe fluid retention  . Procrit [Epoetin Alfa]     Per Dr. Eliane Decree note: she apparently has had an "adverse reaction" to Procrit after which she gets diffuse arthralgias and myalgias.   Home medications Prior to Admission medications   Medication Sig Start Date End Date Taking? Authorizing Provider  amLODipine (NORVASC) 10 MG tablet Take 1 tablet (10 mg total) by mouth daily. 05/24/14  Yes Lars Masson, MD  aspirin EC 81 MG tablet Take 81 mg by mouth daily.   Yes Historical Provider, MD  atorvastatin (LIPITOR) 20 MG tablet Take 1 tablet (20 mg total) by mouth daily at 6 PM. 05/24/14  Yes Lars Masson, MD  carvedilol (COREG) 12.5 MG tablet Take 1 tablet (12.5 mg total) by mouth 2 (two) times daily with a meal. 03/08/14  Yes Lars Masson, MD  ceFEPIme 2 g in dextrose 5 % 50 mL Inject 2 g into the vein See admin instructions. For next 6 dialysis treatments   Yes Historical Provider, MD  Cholecalciferol (VITAMIN D) 1000 UNITS capsule Take 1,000 Units by mouth daily.    Yes Historical Provider, MD  clindamycin (CLEOCIN) 300 MG capsule Take 300 mg by mouth 4 (four) times daily. For foot ulcer   Yes Historical Provider, MD  cloNIDine (CATAPRES) 0.1 MG tablet Take 0.1 mg by mouth daily at 10 pm.  09/14/13  Yes Lars Masson, MD  docusate sodium 100 MG CAPS Take 100 mg by mouth 2 (two) times daily. 07/26/14  Yes Elease Etienne, MD  famotidine (PEPCID) 20 MG tablet Take 20 mg by mouth daily.   Yes Historical Provider, MD  gabapentin (NEURONTIN) 100 MG capsule Take 100 mg by mouth at bedtime.   Yes Historical Provider, MD  HEPARIN SODIUM, BOVINE, IJ Inject 5,000 Units as directed 3 (three) times daily.   Yes Historical Provider, MD  hydrALAZINE (APRESOLINE) 50 MG tablet Take 50 mg by mouth every 6 (six) hours.   Yes Historical Provider, MD  insulin glargine (LANTUS) 100 UNIT/ML injection Inject 0.12 mLs (12 Units total) into  the skin daily. 08/25/14  Yes Jerald Kief, MD  isosorbide mononitrate (ISMO,MONOKET) 20 MG tablet Take 1 tablet (20 mg total) by mouth 2 (two) times daily. 03/08/14  Yes Lars Masson, MD  levothyroxine (SYNTHROID, LEVOTHROID) 88 MCG tablet Take 88 mcg by mouth daily before breakfast.   Yes Historical Provider, MD  Probiotic Product (PROBIOTIC DAILY PO) Take 1 capsule by mouth 2 (  two) times daily.   Yes Historical Provider, MD  senna-docusate (SENOKOT-S) 8.6-50 MG per tablet Take 2 tablets by mouth 2 (two) times daily. 07/26/14  Yes Elease EtienneAnand D Hongalgi, MD  sevelamer (RENAGEL) 800 MG tablet Take 1,600 mg by mouth 3 (three) times daily with meals.   Yes Historical Provider, MD  ticagrelor (BRILINTA) 90 MG TABS tablet Take 1 tablet (90 mg total) by mouth 2 (two) times daily. 08/25/14  Yes Jerald KiefStephen K Chiu, MD  vancomycin (VANCOCIN) 250 MG capsule Take 500 mg by mouth See admin instructions. For next 5 dialysis treatments   Yes Historical Provider, MD  vitamin C (ASCORBIC ACID) 500 MG tablet Take 500 mg by mouth daily.   Yes Historical Provider, MD  acetaminophen (TYLENOL) 650 MG CR tablet Take 650 mg by mouth every 8 (eight) hours as needed for pain.    Historical Provider, MD  HYDROcodone-acetaminophen (NORCO/VICODIN) 5-325 MG per tablet Take 1-2 tablets by mouth every 6 (six) hours as needed for moderate pain. 08/27/14   Tiffany L Reed, DO  insulin aspart (NOVOLOG) 100 UNIT/ML injection Inject 0-9 Units into the skin 3 (three) times daily with meals. CBG < 70: implement hypoglycemia protocol CBG 70 - 120: 0 units CBG 121 - 150: 1 unit CBG 151 - 200: 2 units CBG 201 - 250: 3 units CBG 251 - 300: 5 units CBG 301 - 350: 7 units CBG 351 - 400: 9 units CBG > 400: call MD. 07/26/14   Elease EtienneAnand D Hongalgi, MD  insulin aspart (NOVOLOG) 100 UNIT/ML injection Inject 5 Units into the skin 2 (two) times daily. Before breakfast and before lunch 07/26/14   Elease EtienneAnand D Hongalgi, MD  ondansetron (ZOFRAN) 4 MG tablet Take 4  mg by mouth every 6 (six) hours as needed for nausea or vomiting.    Historical Provider, MD   Liver Function Tests  Recent Labs Lab 11/22/14 0800  AST 14  ALT 11  ALKPHOS 233*  BILITOT 0.4  PROT 6.4  ALBUMIN 2.5*   No results for input(s): LIPASE, AMYLASE in the last 168 hours. CBC  Recent Labs Lab 11/22/14 0800  WBC 9.3  HGB 11.3*  HCT 35.8*  MCV 101.7*  PLT 165   Basic Metabolic Panel  Recent Labs Lab 11/22/14 0800  NA 138  K 3.9  CL 98  CO2 26  GLUCOSE 83  BUN 17  CREATININE 2.55*  CALCIUM 8.9    Filed Vitals:   11/22/14 1045 11/22/14 1130 11/22/14 1200 11/22/14 1215  BP: 166/55 160/51 150/67 169/61  Pulse: 60 58 59 62  Resp:      SpO2: 100% 99% 100% 99%   Exam Elderly WF no distress No rash, cyanosis or gangrene Sclera anicteric, throat clear No jvd Chest is clear on R, rales L base RRR no MRG Abd soft, NTND, +BS , no ascites No LE edema, muscle wasting all ext Large deep ulcers R heel and R medial calf w/o gross odor, purulence or surrounding erythema Neuro is nf, ox 3  HD: MWF [facility] 3h 45min   350/A1.5   2/2.25 Bath  46kg   LUA AVF   Heparin 2000 Aranesp 80 / wk, no other meds Cefipime/ Vanc started 12/16, ordered by SNF MD for wounds     Assessment: 1. AMS d/t hypoglycemia - resolved 2. Deep pressure ulcers , R heel/ R calf - admitted for w/u 3. ESRD on HD 4. Anemia on aranesp 5. MBD on Renagel 6. CAD w stents on  Brilanta, asa 7. HTN/volume - no vol excess, on amlod/ coreg/ hydral / clonidine. BP 's up, wt pending 8. DM 1 longstanding since age 68 - on insulin 9. DNR - pt/ family requesting DNR status   Plan- patient says she is not sure if she would go through with surgery for her pressure ulcers, is considering withdrawal of dialysis but prob not immediately. Answered questions about the process and what to expect with W/D of dialysis.  Wants to continue HD for now, plan HD tomorrow.   Vinson Moselle MD (pgr) [phone]     ([phone] 11/22/2014, 12:59 PM

## 2014-11-22 NOTE — Consult Note (Signed)
ANTIBIOTIC CONSULT NOTE - INITIAL  Pharmacy Consult :  Vancomycin, Cefepime Indication : Severe R-foot/leg cellulitis  Allergies  Allergen Reactions  . Ibuprofen     REACTION: edema---thyroid problems  . Prednisone     Causes severe fluid retention  . Procrit [Epoetin Alfa]     Per Dr. Eliane DecreePatel's note: she apparently has had an "adverse reaction" to Procrit after which she gets diffuse arthralgias and myalgias.    Dosing Weight : 45 kg  VITALS: Temp: 98.6 F (37 C) (12/17 1441) Temp Source: Oral (12/17 1441) BP: 169/42 mmHg (12/17 1441) Pulse Rate: 69 (12/17 1441)  LABS:  Recent Labs  11/22/14 0800  WBC 9.3  HGB 11.3*  PLT 165  CREATININE 2.55*    MICRO: No results found for this or any previous visit (from the past 720 hour(s)).  ASSESSMENT:  79 y.o. female with long history of DM, HTN, retinopathy, diast HF, CAD with hx stents and ESRD on HD who is admitted with a severe hypoglycemic episode and severe non-healing cellulitis of R-leg/foot.   Patient started on IV Cefepime and Vancomycin at outpatient Dialysis center.  Pharmacy to continue Vancomycin and Cefepime while admitted.  Patient Scheduled for HD q MWF.  GOAL:   Pre-Dialysis level 15 - 25 mcg/ml   Cefepime and Vancomycin dosing for indication and renal function; eradication of infection.  PLAN:  1. Vancomycin 500 mg IV q HD on MWF. 2. Cefepime 2 gm IV q HD on MWF. Follow up cultures, clinical course, HD schedule, Levels, and adjust as clinically indicated.  Velda ShellStramoski, Sandrine Bloodsworth J 11/22/2014,3:46 PM

## 2014-11-22 NOTE — Consult Note (Signed)
ORTHOPAEDIC CONSULTATION  REQUESTING PHYSICIAN: Debbe Odea, MD  Chief Complaint: possible navicular injury  HPI: Nicole [name] is a 79 y.o. female who was admitted by medicine vascular surgery is managing her leg wound. She had an x-ray which did not show osteomyelitis but did show possible navicular avulsion fracture. She denies any pain in this region of the foot.  Past Medical History  Diagnosis Date  . Retinopathy   . Hypertension   . Chronic diastolic CHF (congestive heart failure)     Echo (9/15):  Mild LVH, EF 50-55%, mild MR, mod LAE  . CAD (coronary artery disease)     a.  Prior PCI in 1998 x 2; b.  s/p cath in 2001, c.  negative Myoview in 2008 & 2011, d.  s/p cath in June 2012 with calcified disease of the proximal LCX with normal flow reserve. e. NSTEMI (9/15):  LHC (9/15):  Ostial LM mild disease, mid LAD mild disease, ostial Dx mod disease, prox CFX 90%, OM1 stent ok, ostial RCA disease (non-dominant); LVEDP 24 mmHg >>> PCI:  2.25 x 16 Promus DES to CFX   . Cerebrovascular disease     a. Carotid US (8/14):  Bilateral ICA 40-59% >> repeat 1 year  . Peripheral vascular disease     prior angiography showing RSFA stenosis, left anterior tibial and left posterior tibial stenoses  . Hypercholesterolemia   . Diabetes mellitus     insulin dependent  . Hypothyroidism   . Hiatal hernia   . Degenerative joint disease   . Osteoporosis   . Vitamin D deficiency   . Anemia of chronic disease   . ESRD (end stage renal disease)     a. CKD progressed to ESRD 10/2013. b. s/p LUE basilic transposition by vascular on 11/08/13.  . Dialysis patient     Jory Sims, and Friday  . Left acetabular fracture 07/2014    complex - managemed medically  . Myocardial infarction   . Depression    Past Surgical History  Procedure Laterality Date  . Abdominal aortogram  07/27/2003    by Dr. Lyndel Safe  . Bilateral lower extremity angiogram  07/27/2003    by Dr. Lyndel Safe  . Selective  angiography,right superficial femoral artery, right and left iliac arteries  07/27/2003    by Dr. Lyndel Safe  . Measurement of gradient right and left iliac arteries  07/27/2003    by Dr. Lyndel Safe  . Bilateral cataract sugery  2000 and 2002    by Dr. Ishmael Holter  . Repair left hip fracture  04/2008    by Dr. Alvan Dame  . Bascilic vein transposition Left 11/08/2013    Procedure: BASCILIC VEIN TRANSPOSITION;  Surgeon: Mal Misty, MD;  Location: [facility];  Service: Vascular;  Laterality: Left;  [facility] Eye surgery      Vision loss in Right Eye  . Renal angiogram N/A 08/21/2013    Procedure: RENAL ANGIOGRAM;  Surgeon: Blane Ohara, MD;  Location: [facility] CATH LAB;  Service: Cardiovascular;  Laterality: N/A;  . Right heart catheterization N/A 10/31/2013    Procedure: RIGHT HEART CATH;  Surgeon: Jolaine Artist, MD;  Location: [facility] CATH LAB;  Service: Cardiovascular;  Laterality: N/A;  . Left heart catheterization with coronary angiogram N/A 08/20/2014    Procedure: LEFT HEART CATHETERIZATION WITH CORONARY ANGIOGRAM;  Surgeon: Jettie Booze, MD;  Location: [facility] CATH LAB;  Service: Cardiovascular;  Laterality: N/A;  . Vitrectomy     History   Social History  .  Marital Status: Married    Spouse Name: Joyice Faster. [place]    Number of Children: 2  . Years of Education: N/A   Occupational History  . retired    Social History Main Topics  . Smoking status: Former Smoker    Types: Cigarettes    Quit date: 12/07/1974  . Smokeless tobacco: Never Used     Comment: pt started smoking in 1956  . Alcohol Use: No  . Drug Use: No  . Sexual Activity: No   Other Topics Concern  . None   Social History Narrative   Married to [place]   2 children---son has kidney and pancrease transplant   Family History  Problem Relation Age of Onset  . Heart disease Mother   . Cancer Mother   . Cancer Father   . Cancer Brother    Allergies  Allergen Reactions  . Ibuprofen     REACTION: edema---thyroid problems  .  Prednisone     Causes severe fluid retention  . Procrit [Epoetin Alfa]     Per Dr. Serita Grit note: she apparently has had an "adverse reaction" to Procrit after which she gets diffuse arthralgias and myalgias.   Prior to Admission medications   Medication Sig Start Date End Date Taking? Authorizing Provider  amLODipine (NORVASC) 10 MG tablet Take 1 tablet (10 mg total) by mouth daily. 05/24/14  Yes Dorothy Spark, MD  aspirin EC 81 MG tablet Take 81 mg by mouth daily.   Yes Historical Provider, MD  atorvastatin (LIPITOR) 20 MG tablet Take 1 tablet (20 mg total) by mouth daily at 6 PM. 05/24/14  Yes Dorothy Spark, MD  carvedilol (COREG) 12.5 MG tablet Take 1 tablet (12.5 mg total) by mouth 2 (two) times daily with a meal. 03/08/14  Yes Dorothy Spark, MD  ceFEPIme 2 g in dextrose 5 % 50 mL Inject 2 g into the vein See admin instructions. For next 6 dialysis treatments   Yes Historical Provider, MD  Cholecalciferol (VITAMIN D) 1000 UNITS capsule Take 1,000 Units by mouth daily.    Yes Historical Provider, MD  clindamycin (CLEOCIN) 300 MG capsule Take 300 mg by mouth 4 (four) times daily. For foot ulcer   Yes Historical Provider, MD  cloNIDine (CATAPRES) 0.1 MG tablet Take 0.1 mg by mouth daily at 10 pm.  09/14/13  Yes Dorothy Spark, MD  docusate sodium 100 MG CAPS Take 100 mg by mouth 2 (two) times daily. 07/26/14  Yes Modena Jansky, MD  famotidine (PEPCID) 20 MG tablet Take 20 mg by mouth daily.   Yes Historical Provider, MD  gabapentin (NEURONTIN) 100 MG capsule Take 100 mg by mouth at bedtime.   Yes Historical Provider, MD  HEPARIN SODIUM, BOVINE, IJ Inject 5,000 Units as directed 3 (three) times daily.   Yes Historical Provider, MD  hydrALAZINE (APRESOLINE) 50 MG tablet Take 50 mg by mouth every 6 (six) hours.   Yes Historical Provider, MD  insulin glargine (LANTUS) 100 UNIT/ML injection Inject 0.12 mLs (12 Units total) into the skin daily. 08/25/14  Yes Donne Hazel, MD  isosorbide  mononitrate (ISMO,MONOKET) 20 MG tablet Take 1 tablet (20 mg total) by mouth 2 (two) times daily. 03/08/14  Yes Dorothy Spark, MD  levothyroxine (SYNTHROID, LEVOTHROID) 88 MCG tablet Take 88 mcg by mouth daily before breakfast.   Yes Historical Provider, MD  Probiotic Product (PROBIOTIC DAILY PO) Take 1 capsule by mouth 2 (two) times daily.   Yes  Historical Provider, MD  senna-docusate (SENOKOT-S) 8.6-50 MG per tablet Take 2 tablets by mouth 2 (two) times daily. 07/26/14  Yes Modena Jansky, MD  sevelamer (RENAGEL) 800 MG tablet Take 1,600 mg by mouth 3 (three) times daily with meals.   Yes Historical Provider, MD  ticagrelor (BRILINTA) 90 MG TABS tablet Take 1 tablet (90 mg total) by mouth 2 (two) times daily. 08/25/14  Yes Donne Hazel, MD  vancomycin (VANCOCIN) 250 MG capsule Take 500 mg by mouth See admin instructions. For next 5 dialysis treatments   Yes Historical Provider, MD  vitamin C (ASCORBIC ACID) 500 MG tablet Take 500 mg by mouth daily.   Yes Historical Provider, MD  acetaminophen (TYLENOL) 650 MG CR tablet Take 650 mg by mouth every 8 (eight) hours as needed for pain.    Historical Provider, MD  HYDROcodone-acetaminophen (NORCO/VICODIN) 5-325 MG per tablet Take 1-2 tablets by mouth every 6 (six) hours as needed for moderate pain. 08/27/14   Tiffany L Reed, DO  insulin aspart (NOVOLOG) 100 UNIT/ML injection Inject 0-9 Units into the skin 3 (three) times daily with meals. CBG < 70: implement hypoglycemia protocol CBG 70 - 120: 0 units CBG 121 - 150: 1 unit CBG 151 - 200: 2 units CBG 201 - 250: 3 units CBG 251 - 300: 5 units CBG 301 - 350: 7 units CBG 351 - 400: 9 units CBG > 400: call MD. 07/26/14   Modena Jansky, MD  insulin aspart (NOVOLOG) 100 UNIT/ML injection Inject 5 Units into the skin 2 (two) times daily. Before breakfast and before lunch 07/26/14   Modena Jansky, MD  ondansetron (ZOFRAN) 4 MG tablet Take 4 mg by mouth every 6 (six) hours as needed for nausea or  vomiting.    Historical Provider, MD   Dg Chest 1 View  11/22/2014   CLINICAL DATA:  Altered mental status  EXAM: CHEST - 1 VIEW  COMPARISON:  August 20, 2014  FINDINGS: There is no edema or consolidation. Heart is mildly enlarged with pulmonary vascularity within normal limits. There is atherosclerotic change throughout the aorta. No adenopathy. There is degenerative change in the thoracic spine. Bones are osteoporotic.  IMPRESSION: No edema or consolidation.  Heart prominent but stable.   Electronically Signed   By: Lowella Grip M.D.   On: 11/22/2014 07:51   Dg Pelvis 1-2 Views  11/22/2014   CLINICAL DATA:  79 year old female with left pelvic fractures.  EXAM: PELVIS - 1-2 VIEW  COMPARISON:  07/23/2014.  FINDINGS: Previously demonstrated fractures of the left superior and inferior pubic rami appear to be healing. There continues to be a displaced comminuted fracture of the left acetabulum, better demonstrated on prior CT scan 07/23/2014. Fracture lines appear slightly blurred, suggesting some healing, however, there are still multiple clearly define fracture lines evident. Left femoral head again projects within the fractured acetabulum, with some medial displacement, similar to the prior examination. No new acute displaced fracture identified. Three fixation screws again are noted in the left femoral neck, with old healed left femoral neck fracture.  IMPRESSION: 1. The appearance of the bony pelvis is very similar to prior study 07/23/2014, with a small amount of bony healing in the displaced comminuted left acetabular fracture, as well as the left inferior and superior pubic rami fractures, as above.   Electronically Signed   By: Vinnie Langton M.D.   On: 11/22/2014 11:36   Ct Head Wo Contrast  11/22/2014   CLINICAL DATA:  Found on floor.  Decreased responsiveness.  ESRD.  EXAM: CT HEAD WITHOUT CONTRAST  CT CERVICAL SPINE WITHOUT CONTRAST  TECHNIQUE: Multidetector CT imaging of the head and  cervical spine was performed following the standard protocol without intravenous contrast. Multiplanar CT image reconstructions of the cervical spine were also generated.  COMPARISON:  07/22/2014  FINDINGS: CT HEAD FINDINGS  Ventricle size is within normal limits.  Mild atrophy is stable.  Negative for acute infarct.  Negative for hemorrhage or mass.  Negative for skull fracture. Hyperdense vitreous in the right globe consistent with prior surgical procedure, unchanged from the prior study.  CT CERVICAL SPINE FINDINGS  Cervical kyphosis. Advanced multilevel disc degeneration and spondylosis. Mild facet degeneration diffusely.  Negative for fracture or mass lesion.  IMPRESSION: No acute intracranial abnormality.  Advanced cervical degenerative change.  Negative for fracture   Electronically Signed   By: Franchot Gallo M.D.   On: 11/22/2014 07:52   Ct Cervical Spine Wo Contrast  11/22/2014   CLINICAL DATA:  Found on floor.  Decreased responsiveness.  ESRD.  EXAM: CT HEAD WITHOUT CONTRAST  CT CERVICAL SPINE WITHOUT CONTRAST  TECHNIQUE: Multidetector CT imaging of the head and cervical spine was performed following the standard protocol without intravenous contrast. Multiplanar CT image reconstructions of the cervical spine were also generated.  COMPARISON:  07/22/2014  FINDINGS: CT HEAD FINDINGS  Ventricle size is within normal limits.  Mild atrophy is stable.  Negative for acute infarct.  Negative for hemorrhage or mass.  Negative for skull fracture. Hyperdense vitreous in the right globe consistent with prior surgical procedure, unchanged from the prior study.  CT CERVICAL SPINE FINDINGS  Cervical kyphosis. Advanced multilevel disc degeneration and spondylosis. Mild facet degeneration diffusely.  Negative for fracture or mass lesion.  IMPRESSION: No acute intracranial abnormality.  Advanced cervical degenerative change.  Negative for fracture   Electronically Signed   By: Franchot Gallo M.D.   On: 11/22/2014  07:52   Dg Foot Complete Left  11/22/2014   CLINICAL DATA:  Patient recently fell.  Currently with foot ulcer  EXAM: LEFT FOOT - COMPLETE 3+ VIEW  COMPARISON:  None.  FINDINGS: Frontal, oblique, and lateral views were obtained. Bones are diffusely osteoporotic. There is a tiny calcification just medial to the navicular on the oblique view which may represent a small avulsion type injury. No other evidence of potential fracture. No dislocation. There is no erosive change or bony destruction. There is narrowing of all PIP and DIP joints.  There is pes cavus.  There are scattered foci of arterial vascular calcification. No soft tissue abscess is appreciable.  IMPRESSION: Question tiny avulsion along the medial navicular. No erosive change or bony destruction. Vascular calcification noted at several sites. Pes cavus present. Bones osteoporotic.   Electronically Signed   By: Lowella Grip M.D.   On: 11/22/2014 11:27    Positive ROS: All other systems have been reviewed and were otherwise negative with the exception of those mentioned in the HPI and as above.  Labs cbc  Recent Labs  11/22/14 0800  WBC 9.3  HGB 11.3*  HCT 35.8*  PLT 165    Labs inflam No results for input(s): CRP in the last 72 hours.  Invalid input(s): ESR  Labs coag  Recent Labs  11/22/14 0800  INR 0.93     Recent Labs  11/22/14 0800  NA 138  K 3.9  CL 98  CO2 26  GLUCOSE 83  BUN 17  CREATININE 2.55*  CALCIUM 8.9    Physical Exam: Filed Vitals:   11/22/14 1345  BP: 166/41  Pulse: 65  Resp:    General: Alert, no acute distress Cardiovascular: No pedal edema Respiratory: No cyanosis, no use of accessory musculature GI: No organomegaly, abdomen is soft and non-tender Skin: No lesions in the area of chief complaint other than those listed below in MSK exam.  Neurologic: Sensation intact distally Psychiatric: Patient is competent for consent with normal mood and affect Lymphatic: No axillary or  cervical lymphadenopathy  MUSCULOSKELETAL:  LLE: no TTP over navicular, WWP Other extremities are atraumatic with painless ROM and NVI.  Assessment: No fracture at navicula  Plan: No orthopedic intervention indicated Weight Bearing Status: WBAT   I will sign off, pls call with additional questions   Edmonia Lynch, D, MD Cell [phone]   11/22/2014 2:24 PM

## 2014-11-22 NOTE — H&P (Addendum)
[facility] History and Physical  Nicole [name] HYQ:[id number] DOB: 08/02/1935 DOA: 11/22/2014   PCP: Loreen FreudYvonne Lowne, DO    Chief Complaint: unresponsive  HPI: Nicole [name] is a 79 y.o. female with end-stage renal disease on hemodialysis, diabetes mellitus insulin requiring, chronic diastolic heart failure, acute MI in September 2015 status post stent to the left circumflex, lower extremity wound, hypothyroidism, peripheral artery disease and hyperlipidemia. Patient is brought to the hospital from [facility] after being found unresponsive on the floor with snoring respirations and a CBG of 33. She is now awake and alert. She tells me that she has been eating poorly and did not eat much dinner last night. She recalls waking up this morning and feeling funny prior to being found unresponsive. Other than today she has not been having frequent hypoglycemia but sugars have been much better controlled than they had been in the past. The patient also has an ulcer on her left heel for which she was started back on IV antibiotics yesterday. She was due for a fistulogram today as outpatient.    General: The patient denies fever, weight loss- she has had anorexia Cardiac: Denies chest pain, syncope, palpitations, pedal edema  Respiratory: Denies cough, shortness of breath, wheezing GI: Denies severe indigestion/heartburn, abdominal pain, nausea, vomiting, diarrhea - has problems with constipation GU: Does not urinate Musculoskeletal: Admits to generalized arthritis  Skin: Has an ulcer on her left leg and left heel Neurologic: Denies focal weakness or numbness, change in vision Psychiatry: Denies depression or anxiety.   Past Medical History  Diagnosis Date  . Retinopathy   . Hypertension   . Chronic diastolic CHF (congestive heart failure)     Echo (9/15):  Mild LVH, EF 50-55%, mild MR, mod LAE  . CAD (coronary artery disease)     a.  Prior PCI in 1998 x 2; b.  s/p cath in 2001,  c.  negative Myoview in 2008 & 2011, d.  s/p cath in June 2012 with calcified disease of the proximal LCX with normal flow reserve. e. NSTEMI (9/15):  LHC (9/15):  Ostial LM mild disease, mid LAD mild disease, ostial Dx mod disease, prox CFX 90%, OM1 stent ok, ostial RCA disease (non-dominant); LVEDP 24 mmHg >>> PCI:  2.25 x 16 Promus DES to CFX   . Cerebrovascular disease     a. Carotid US (8/14):  Bilateral ICA 40-59% >> repeat 1 year  . Peripheral vascular disease     prior angiography showing RSFA stenosis, left anterior tibial and left posterior tibial stenoses  . Hypercholesterolemia   . Diabetes mellitus     insulin dependent  . Hypothyroidism   . Hiatal hernia   . Degenerative joint disease   . Osteoporosis   . Vitamin D deficiency   . Anemia of chronic disease   . ESRD (end stage renal disease)     a. CKD progressed to ESRD 10/2013. b. s/p LUE basilic transposition by vascular on 11/08/13.  Marland Kitchen. Left acetabular fracture 07/2014    complex - managemed medically    Past Surgical History  Procedure Laterality Date  . Abdominal aortogram  07/27/2003    by Dr. Chales AbrahamsGupta  . Bilateral lower extremity angiogram  07/27/2003    by Dr. Chales AbrahamsGupta  . Selective angiography,right superficial femoral artery, right and left iliac arteries  07/27/2003    by Dr. Chales AbrahamsGupta  . Measurement of gradient right and left iliac arteries  07/27/2003    by Dr. Chales AbrahamsGupta  .  Bilateral cataract sugery  2000 and 2002    by Dr. Cecilie Kicks  . Repair left hip fracture  04/2008    by Dr. Charlann Boxer  . Bascilic vein transposition Left 11/08/2013    Procedure: BASCILIC VEIN TRANSPOSITION;  Surgeon: Pryor Ochoa, MD;  [facility] OR;  Service: Vascular;  Laterality: Left;  Marland Kitchen Eye surgery      Vision loss in Right Eye  . Renal angiogram N/A 08/21/2013    Procedure: RENAL ANGIOGRAM;  Surgeon: Micheline Chapman, MD;  Location: [facility] CATH LAB;  Service: Cardiovascular;  Laterality: N/A;  . Right heart catheterization N/A 10/31/2013    Procedure:  RIGHT HEART CATH;  Surgeon: Dolores Patty, MD;  Location: [facility] CATH LAB;  Service: Cardiovascular;  Laterality: N/A;  . Left heart catheterization with coronary angiogram N/A 08/20/2014    Procedure: LEFT HEART CATHETERIZATION WITH CORONARY ANGIOGRAM;  Surgeon: Corky Crafts, MD;  Location: [facility] CATH LAB;  Service: Cardiovascular;  Laterality: N/A;    Social History: She does not smoke or drink alcohol Lives at [facility]   Allergies  Allergen Reactions  . Ibuprofen     REACTION: edema---thyroid problems  . Prednisone     Causes severe fluid retention  . Procrit [Epoetin Alfa]     Per Dr. Eliane Decree note: she apparently has had an "adverse reaction" to Procrit after which she gets diffuse arthralgias and myalgias.    Family History  Problem Relation Age of Onset  . Heart disease Mother   . Cancer Mother   . Cancer Father   . Cancer Brother       Prior to Admission medications   Medication Sig Start Date End Date Taking? Authorizing Provider  amLODipine (NORVASC) 10 MG tablet Take 1 tablet (10 mg total) by mouth daily. 05/24/14  Yes Lars Masson, MD  aspirin EC 81 MG tablet Take 81 mg by mouth daily.   Yes Historical Provider, MD  atorvastatin (LIPITOR) 20 MG tablet Take 1 tablet (20 mg total) by mouth daily at 6 PM. 05/24/14  Yes Lars Masson, MD  carvedilol (COREG) 12.5 MG tablet Take 1 tablet (12.5 mg total) by mouth 2 (two) times daily with a meal. 03/08/14  Yes Lars Masson, MD  ceFEPIme 2 g in dextrose 5 % 50 mL Inject 2 g into the vein See admin instructions. For next 6 dialysis treatments   Yes Historical Provider, MD  Cholecalciferol (VITAMIN D) 1000 UNITS capsule Take 1,000 Units by mouth daily.    Yes Historical Provider, MD  clindamycin (CLEOCIN) 300 MG capsule Take 300 mg by mouth 4 (four) times daily. For foot ulcer   Yes Historical Provider, MD  cloNIDine (CATAPRES) 0.1 MG tablet Take 0.1 mg by mouth daily at 10 pm.  09/14/13  Yes Lars Masson, MD   docusate sodium 100 MG CAPS Take 100 mg by mouth 2 (two) times daily. 07/26/14  Yes Elease Etienne, MD  famotidine (PEPCID) 20 MG tablet Take 20 mg by mouth daily.   Yes Historical Provider, MD  gabapentin (NEURONTIN) 100 MG capsule Take 100 mg by mouth at bedtime.   Yes Historical Provider, MD  HEPARIN SODIUM, BOVINE, IJ Inject 5,000 Units as directed 3 (three) times daily.   Yes Historical Provider, MD  hydrALAZINE (APRESOLINE) 50 MG tablet Take 50 mg by mouth every 6 (six) hours.   Yes Historical Provider, MD  insulin glargine (LANTUS) 100 UNIT/ML injection Inject 0.12 mLs (12 Units total) into the  skin daily. 08/25/14  Yes Jerald Kief, MD  isosorbide mononitrate (ISMO,MONOKET) 20 MG tablet Take 1 tablet (20 mg total) by mouth 2 (two) times daily. 03/08/14  Yes Lars Masson, MD  levothyroxine (SYNTHROID, LEVOTHROID) 88 MCG tablet Take 88 mcg by mouth daily before breakfast.   Yes Historical Provider, MD  Probiotic Product (PROBIOTIC DAILY PO) Take 1 capsule by mouth 2 (two) times daily.   Yes Historical Provider, MD  senna-docusate (SENOKOT-S) 8.6-50 MG per tablet Take 2 tablets by mouth 2 (two) times daily. 07/26/14  Yes Elease Etienne, MD  sevelamer (RENAGEL) 800 MG tablet Take 1,600 mg by mouth 3 (three) times daily with meals.   Yes Historical Provider, MD  ticagrelor (BRILINTA) 90 MG TABS tablet Take 1 tablet (90 mg total) by mouth 2 (two) times daily. 08/25/14  Yes Jerald Kief, MD  vancomycin (VANCOCIN) 250 MG capsule Take 500 mg by mouth See admin instructions. For next 5 dialysis treatments   Yes Historical Provider, MD  vitamin C (ASCORBIC ACID) 500 MG tablet Take 500 mg by mouth daily.   Yes Historical Provider, MD  acetaminophen (TYLENOL) 650 MG CR tablet Take 650 mg by mouth every 8 (eight) hours as needed for pain.    Historical Provider, MD  HYDROcodone-acetaminophen (NORCO/VICODIN) 5-325 MG per tablet Take 1-2 tablets by mouth every 6 (six) hours as needed for moderate  pain. 08/27/14   Tiffany L Reed, DO  insulin aspart (NOVOLOG) 100 UNIT/ML injection Inject 0-9 Units into the skin 3 (three) times daily with meals. CBG < 70: implement hypoglycemia protocol CBG 70 - 120: 0 units CBG 121 - 150: 1 unit CBG 151 - 200: 2 units CBG 201 - 250: 3 units CBG 251 - 300: 5 units CBG 301 - 350: 7 units CBG 351 - 400: 9 units CBG > 400: call MD. 07/26/14   Elease Etienne, MD  insulin aspart (NOVOLOG) 100 UNIT/ML injection Inject 5 Units into the skin 2 (two) times daily. Before breakfast and before lunch 07/26/14   Elease Etienne, MD  ondansetron (ZOFRAN) 4 MG tablet Take 4 mg by mouth every 6 (six) hours as needed for nausea or vomiting.    Historical Provider, MD     Physical Exam: Filed Vitals:   11/22/14 1610 11/22/14 0651 11/22/14 0715  BP: 143/37  117/39  Pulse: 53 50 47  Resp: 15 16   SpO2: 100% 99% 98%     General: Awake alert oriented 3, no acute distress HEENT: Normocephalic and Atraumatic, Mucous membranes pink                PERRLA; EOM intact; No scleral icterus,                 Nares: Patent, Oropharynx: Clear, Fair Dentition                 Neck: FROM, no cervical lymphadenopathy, thyromegaly, carotid bruit or JVD;  Breasts: deferred CHEST WALL: No tenderness  CHEST: Normal respiration, clear to auscultation bilaterally  HEART: Regular rate and rhythm; no murmurs rubs or gallops  BACK: No kyphosis or scoliosis; no CVA tenderness  ABDOMEN: Positive Bowel Sounds, soft, non-tender; no masses, no organomegaly Rectal Exam: deferred EXTREMITIES: No cyanosis, clubbing, or edema Genitalia: not examined  SKIN:  Ulcer on left leg lateral aspect-appears to be stage IV- ulcer on left heel with eschar and therefore unstageable area around the ulcer is erythematous and quite tender CNS: Alert and  Oriented x 4, Nonfocal exam, CN 2-12 intact  Labs on Admission:  Basic Metabolic Panel:  Recent Labs Lab 11/22/14 0800  NA 138  K 3.9  CL 98  CO2  26  GLUCOSE 83  BUN 17  CREATININE 2.55*  CALCIUM 8.9   Liver Function Tests:  Recent Labs Lab 11/22/14 0800  AST 14  ALT 11  ALKPHOS 233*  BILITOT 0.4  PROT 6.4  ALBUMIN 2.5*   No results for input(s): LIPASE, AMYLASE in the last 168 hours. No results for input(s): AMMONIA in the last 168 hours. CBC:  Recent Labs Lab 11/22/14 0800  WBC 9.3  HGB 11.3*  HCT 35.8*  MCV 101.7*  PLT 165   Cardiac Enzymes:  Recent Labs Lab 11/22/14 0800  TROPONINI <0.30    BNP (last 3 results)  Recent Labs  08/20/14 0323  PROBNP 9463.0*   CBG:  Recent Labs Lab 11/22/14 0644 11/22/14 0906 11/22/14 1009 11/22/14 1125  GLUCAP 115* 76 64* 100*    Radiological Exams on Admission: Dg Chest 1 View  11/22/2014   CLINICAL DATA:  Altered mental status  EXAM: CHEST - 1 VIEW  COMPARISON:  August 20, 2014  FINDINGS: There is no edema or consolidation. Heart is mildly enlarged with pulmonary vascularity within normal limits. There is atherosclerotic change throughout the aorta. No adenopathy. There is degenerative change in the thoracic spine. Bones are osteoporotic.  IMPRESSION: No edema or consolidation.  Heart prominent but stable.   Electronically Signed   By: Bretta Bang M.D.   On: 11/22/2014 07:51   Dg Pelvis 1-2 Views  11/22/2014   CLINICAL DATA:  79 year old female with left pelvic fractures.  EXAM: PELVIS - 1-2 VIEW  COMPARISON:  07/23/2014.  FINDINGS: Previously demonstrated fractures of the left superior and inferior pubic rami appear to be healing. There continues to be a displaced comminuted fracture of the left acetabulum, better demonstrated on prior CT scan 07/23/2014. Fracture lines appear slightly blurred, suggesting some healing, however, there are still multiple clearly define fracture lines evident. Left femoral head again projects within the fractured acetabulum, with some medial displacement, similar to the prior examination. No new acute displaced fracture  identified. Three fixation screws again are noted in the left femoral neck, with old healed left femoral neck fracture.  IMPRESSION: 1. The appearance of the bony pelvis is very similar to prior study 07/23/2014, with a small amount of bony healing in the displaced comminuted left acetabular fracture, as well as the left inferior and superior pubic rami fractures, as above.   Electronically Signed   By: Trudie Reed M.D.   On: 11/22/2014 11:36   Ct Head Wo Contrast  11/22/2014   CLINICAL DATA:  Found on floor.  Decreased responsiveness.  ESRD.  EXAM: CT HEAD WITHOUT CONTRAST  CT CERVICAL SPINE WITHOUT CONTRAST  TECHNIQUE: Multidetector CT imaging of the head and cervical spine was performed following the standard protocol without intravenous contrast. Multiplanar CT image reconstructions of the cervical spine were also generated.  COMPARISON:  07/22/2014  FINDINGS: CT HEAD FINDINGS  Ventricle size is within normal limits.  Mild atrophy is stable.  Negative for acute infarct.  Negative for hemorrhage or mass.  Negative for skull fracture. Hyperdense vitreous in the right globe consistent with prior surgical procedure, unchanged from the prior study.  CT CERVICAL SPINE FINDINGS  Cervical kyphosis. Advanced multilevel disc degeneration and spondylosis. Mild facet degeneration diffusely.  Negative for fracture or mass lesion.  IMPRESSION: No acute  intracranial abnormality.  Advanced cervical degenerative change.  Negative for fracture   Electronically Signed   By: Marlan Palau M.D.   On: 11/22/2014 07:52   Ct Cervical Spine Wo Contrast  11/22/2014   CLINICAL DATA:  Found on floor.  Decreased responsiveness.  ESRD.  EXAM: CT HEAD WITHOUT CONTRAST  CT CERVICAL SPINE WITHOUT CONTRAST  TECHNIQUE: Multidetector CT imaging of the head and cervical spine was performed following the standard protocol without intravenous contrast. Multiplanar CT image reconstructions of the cervical spine were also generated.   COMPARISON:  07/22/2014  FINDINGS: CT HEAD FINDINGS  Ventricle size is within normal limits.  Mild atrophy is stable.  Negative for acute infarct.  Negative for hemorrhage or mass.  Negative for skull fracture. Hyperdense vitreous in the right globe consistent with prior surgical procedure, unchanged from the prior study.  CT CERVICAL SPINE FINDINGS  Cervical kyphosis. Advanced multilevel disc degeneration and spondylosis. Mild facet degeneration diffusely.  Negative for fracture or mass lesion.  IMPRESSION: No acute intracranial abnormality.  Advanced cervical degenerative change.  Negative for fracture   Electronically Signed   By: Marlan Palau M.D.   On: 11/22/2014 07:52   Dg Foot Complete Left  11/22/2014   CLINICAL DATA:  Patient recently fell.  Currently with foot ulcer  EXAM: LEFT FOOT - COMPLETE 3+ VIEW  COMPARISON:  None.  FINDINGS: Frontal, oblique, and lateral views were obtained. Bones are diffusely osteoporotic. There is a tiny calcification just medial to the navicular on the oblique view which may represent a small avulsion type injury. No other evidence of potential fracture. No dislocation. There is no erosive change or bony destruction. There is narrowing of all PIP and DIP joints.  There is pes cavus.  There are scattered foci of arterial vascular calcification. No soft tissue abscess is appreciable.  IMPRESSION: Question tiny avulsion along the medial navicular. No erosive change or bony destruction. Vascular calcification noted at several sites. Pes cavus present. Bones osteoporotic.   Electronically Signed   By: Bretta Bang M.D.   On: 11/22/2014 11:27    EKG: Independently reviewed. Sinus brady at 50 bpm - Qtc 507  Assessment/Plan Principal Problem:   Hypoglycemia-   DM (diabetes mellitus), type 2 with renal complications - Sugars have improved-will liberalize diet and continue to follow sugars carefully -Resume Lantus at lower dose if sugars noted to be significantly by  evening otherwise cont with sliding scale coverage  Addendum- sugars rising- transition to carb modified diet - hold off on Lantus as she may need to be NPO for procedures tomorrow  Active Problems:  Left heal infected wound -X-ray Does not reveal any osteomyelitis but there may be a tiny avulsion along the medial navicular- have spoken with Dr Eulah Pont who will consult -Dr. Leanord Hawking was considering referring her back to vascular surgery to see if they could be any improvement made to the blood flow- I have spoken with Dr Edilia Bo who will consult -Continue wound care - resume Vanc and Cefepime with dialysis  Left leg wound - cont wound care    Chronic diastolic heart failure - Fluid management with dialysis -Continue Coreg  CAD with recent MI -Continue Brilinta, aspirin, statin and beta blocker  HTN - coreg, Hydralazine, Imdur, Amlodipine and Clonidine  Prolonged QT - avoid further QT prolonging meds  ? Aflutter in ER noted by nursing staff on tele monitor - follow on Telemetry    PAD (peripheral artery disease) -Continue aspirin    ESRD (  end stage renal disease) -She is due for dialysis tomorrow -she was due for a fistulogram today-will contact vascular surgery to see if his needs to be done in the hospital -Will contact nephrology for dialysis    Hypothyroidism - levothyroxine  Left acetabular fracture 8/15 -[facility]      Consulted: Nephrology, Sharin Monsrhto, Vascular sx  Code Status: DNR- d/w patient in presence of sons Family Communication: sons  DVT Prophylaxis:Heparin  Time spent: 65 min  Valon Glasscock, MD [facility]  If 7PM-7AM, please contact night-coverage [email or web address] 11/22/2014, 11:43 AM

## 2014-11-23 DIAGNOSIS — L97919 Non-pressure chronic ulcer of unspecified part of right lower leg with unspecified severity: Secondary | ICD-10-CM | POA: Diagnosis present

## 2014-11-23 DIAGNOSIS — E1129 Type 2 diabetes mellitus with other diabetic kidney complication: Secondary | ICD-10-CM

## 2014-11-23 LAB — GLUCOSE, CAPILLARY
GLUCOSE-CAPILLARY: 249 mg/dL — AB (ref 70–99)
Glucose-Capillary: 528 mg/dL — ABNORMAL HIGH (ref 70–99)
Glucose-Capillary: 215 mg/dL — ABNORMAL HIGH (ref 70–99)
Glucose-Capillary: 151 mg/dL — ABNORMAL HIGH (ref 70–99)
Glucose-Capillary: 180 mg/dL — ABNORMAL HIGH (ref 70–99)

## 2014-11-23 MED ORDER — HEPARIN SODIUM (PORCINE) 1000 UNIT/ML DIALYSIS: 2000.0000 [IU] | Freq: Once | INTRAMUSCULAR | Status: DC

## 2014-11-23 MED ORDER — HYDROCODONE-ACETAMINOPHEN 5-325 MG PO TABS
ORAL_TABLET | ORAL | Status: AC
  Administered 2014-11-23: 1 via ORAL
  Filled 2014-11-23: qty 1

## 2014-11-23 MED ORDER — SODIUM CHLORIDE 0.9 % IV SOLN: 100.0000 mL | INTRAVENOUS | Status: DC | PRN

## 2014-11-23 MED ORDER — COLLAGENASE 250 UNIT/GM EX OINT
TOPICAL_OINTMENT | Freq: Every day | CUTANEOUS | Status: DC
  Administered 2014-11-23 – 2014-11-27 (×5): via TOPICAL
  Filled 2014-11-23: qty 30

## 2014-11-23 MED ORDER — PENTAFLUOROPROP-TETRAFLUOROETH EX AERO: 1.0000 "application " | INHALATION_SPRAY | CUTANEOUS | Status: DC | PRN

## 2014-11-23 MED ORDER — HEPARIN SODIUM (PORCINE) 1000 UNIT/ML DIALYSIS: 1000.0000 [IU] | INTRAMUSCULAR | Status: DC | PRN

## 2014-11-23 MED ORDER — NEPRO/CARBSTEADY PO LIQD
237.0000 mL | ORAL | Status: DC | PRN
  Filled 2014-11-23: qty 237

## 2014-11-23 MED ORDER — LIDOCAINE-PRILOCAINE 2.5-2.5 % EX CREA
1.0000 "application " | TOPICAL_CREAM | CUTANEOUS | Status: DC | PRN
  Filled 2014-11-23: qty 5

## 2014-11-23 MED ORDER — INSULIN ASPART 100 UNIT/ML ~~LOC~~ SOLN
10.0000 [IU] | Freq: Once | SUBCUTANEOUS | Status: AC
  Administered 2014-11-23: 10 [IU] via SUBCUTANEOUS

## 2014-11-23 MED ORDER — HYDROCODONE-ACETAMINOPHEN 5-325 MG PO TABS
1.0000 | ORAL_TABLET | ORAL | Status: DC | PRN
  Administered 2014-11-23: 1 via ORAL
  Administered 2014-11-24 – 2014-11-25 (×2): 2 via ORAL
  Administered 2014-11-25 – 2014-11-26 (×3): 1 via ORAL
  Administered 2014-11-27: 2 via ORAL
  Filled 2014-11-23 (×2): qty 1
  Filled 2014-11-23: qty 2
  Filled 2014-11-23: qty 1
  Filled 2014-11-23 (×2): qty 2
  Filled 2014-11-23: qty 1

## 2014-11-23 MED ORDER — LIDOCAINE HCL (PF) 1 % IJ SOLN: 5.0000 mL | INTRAMUSCULAR | Status: DC | PRN

## 2014-11-23 MED ORDER — ALTEPLASE 2 MG IJ SOLR
2.0000 mg | Freq: Once | INTRAMUSCULAR | Status: DC | PRN
  Filled 2014-11-23: qty 2

## 2014-11-23 NOTE — Progress Notes (Signed)
   VASCULAR SURGERY ASSESSMENT & PLAN:  * Non healing wounds left leg with multilevel arterial occlusive disease  * For HD today.  * We could potentially schedule her for an angiogram next week if she is ready medically.  * I will be out of town next week. If she is ready for angio, please call our office [phone]) and we can try to schedule.  SUBJECTIVE: Still seems fairly weak.  PHYSICAL EXAM: Filed Vitals:   11/22/14 1500 11/22/14 2100 11/23/14 0005 11/23/14 0500  BP:  134/41 129/32 134/43  Pulse:  70 65 67  Temp:  99.1 F (37.3 C)  98.6 F (37 C)  TempSrc:  Oral  Oral  Resp:  16  16  Height: 5' 4.17" (1.63 m)     Weight: 99 lb 3.2 oz (44.997 kg) 100 lb 8.5 oz (45.6 kg)    SpO2:  100%  100%   No change in left post calf wound and left heel wound.  LABS: Lab Results  Component Value Date   WBC 9.3 11/22/2014   HGB 11.3* 11/22/2014   HCT 35.8* 11/22/2014   MCV 101.7* 11/22/2014   PLT 165 11/22/2014   Lab Results  Component Value Date   CREATININE 2.55* 11/22/2014   Lab Results  Component Value Date   INR 0.93 11/22/2014   CBG (last 3)   Recent Labs  11/22/14 1654 11/22/14 2209 11/23/14 0315  GLUCAP 273* 367* 528*    Principal Problem:   Hypoglycemia Active Problems:   DM (diabetes mellitus), type 2 with renal complications   Chronic diastolic heart failure   PAD (peripheral artery disease)   ESRD (end stage renal disease)   Hypothyroidism   Cari CarawayChris Koni Kannan Beeper: [phone] 11/23/2014

## 2014-11-23 NOTE — Progress Notes (Signed)
Utilization review completed. Jayanth Szczesniak, RN, BSN. 

## 2014-11-23 NOTE — Progress Notes (Signed)
Subjective:  Seen on dialysis, no current complaints  Objective: Vital signs in last 24 hours: Temp:  [98.6 F (37 C)-99.1 F (37.3 C)] 98.6 F (37 C) (12/18 0500) Pulse Rate:  [58-71] 67 (12/18 0500) Resp:  [16-18] 16 (12/18 0500) BP: (129-169)/(32-67) 134/43 mmHg (12/18 0500) SpO2:  [98 %-100 %] 100 % (12/18 0500) Weight:  [44.997 kg (99 lb 3.2 oz)-45.6 kg (100 lb 8.5 oz)] 45.6 kg (100 lb 8.5 oz) (12/17 2100) Weight change:   Intake/Output from previous day: 12/17 0701 - 12/18 0700 In: 120 [P.O.:120] Out: 13 [Urine:13] Intake/Output this shift:   Lab Results:  Recent Labs  11/22/14 0800  WBC 9.3  HGB 11.3*  HCT 35.8*  PLT 165   BMET:  Recent Labs  11/22/14 0800  NA 138  K 3.9  CL 98  CO2 26  GLUCOSE 83  BUN 17  CREATININE 2.55*  CALCIUM 8.9  ALBUMIN 2.5*   No results for input(s): PTH in the last 72 hours. Iron Studies: No results for input(s): IRON, TIBC, TRANSFERRIN, FERRITIN in the last 72 hours.  Studies/Results: Dg Chest 1 View  11/22/2014   CLINICAL DATA:  Altered mental status  EXAM: CHEST - 1 VIEW  COMPARISON:  August 20, 2014  FINDINGS: There is no edema or consolidation. Heart is mildly enlarged with pulmonary vascularity within normal limits. There is atherosclerotic change throughout the aorta. No adenopathy. There is degenerative change in the thoracic spine. Bones are osteoporotic.  IMPRESSION: No edema or consolidation.  Heart prominent but stable.   Electronically Signed   By: Bretta BangWilliam  Woodruff M.D.   On: 11/22/2014 07:51   Dg Pelvis 1-2 Views  11/22/2014   CLINICAL DATA:  79 year old female with left pelvic fractures.  EXAM: PELVIS - 1-2 VIEW  COMPARISON:  07/23/2014.  FINDINGS: Previously demonstrated fractures of the left superior and inferior pubic rami appear to be healing. There continues to be a displaced comminuted fracture of the left acetabulum, better demonstrated on prior CT scan 07/23/2014. Fracture lines appear slightly blurred,  suggesting some healing, however, there are still multiple clearly define fracture lines evident. Left femoral head again projects within the fractured acetabulum, with some medial displacement, similar to the prior examination. No new acute displaced fracture identified. Three fixation screws again are noted in the left femoral neck, with old healed left femoral neck fracture.  IMPRESSION: 1. The appearance of the bony pelvis is very similar to prior study 07/23/2014, with a small amount of bony healing in the displaced comminuted left acetabular fracture, as well as the left inferior and superior pubic rami fractures, as above.   Electronically Signed   By: Trudie Reedaniel  Entrikin M.D.   On: 11/22/2014 11:36   Ct Head Wo Contrast  11/22/2014   CLINICAL DATA:  Found on floor.  Decreased responsiveness.  ESRD.  EXAM: CT HEAD WITHOUT CONTRAST  CT CERVICAL SPINE WITHOUT CONTRAST  TECHNIQUE: Multidetector CT imaging of the head and cervical spine was performed following the standard protocol without intravenous contrast. Multiplanar CT image reconstructions of the cervical spine were also generated.  COMPARISON:  07/22/2014  FINDINGS: CT HEAD FINDINGS  Ventricle size is within normal limits.  Mild atrophy is stable.  Negative for acute infarct.  Negative for hemorrhage or mass.  Negative for skull fracture. Hyperdense vitreous in the right globe consistent with prior surgical procedure, unchanged from the prior study.  CT CERVICAL SPINE FINDINGS  Cervical kyphosis. Advanced multilevel disc degeneration and spondylosis. Mild facet degeneration diffusely.  Negative for fracture or mass lesion.  IMPRESSION: No acute intracranial abnormality.  Advanced cervical degenerative change.  Negative for fracture   Electronically Signed   By: Marlan Palauharles  Clark M.D.   On: 11/22/2014 07:52   Ct Cervical Spine Wo Contrast  11/22/2014   CLINICAL DATA:  Found on floor.  Decreased responsiveness.  ESRD.  EXAM: CT HEAD WITHOUT CONTRAST  CT  CERVICAL SPINE WITHOUT CONTRAST  TECHNIQUE: Multidetector CT imaging of the head and cervical spine was performed following the standard protocol without intravenous contrast. Multiplanar CT image reconstructions of the cervical spine were also generated.  COMPARISON:  07/22/2014  FINDINGS: CT HEAD FINDINGS  Ventricle size is within normal limits.  Mild atrophy is stable.  Negative for acute infarct.  Negative for hemorrhage or mass.  Negative for skull fracture. Hyperdense vitreous in the right globe consistent with prior surgical procedure, unchanged from the prior study.  CT CERVICAL SPINE FINDINGS  Cervical kyphosis. Advanced multilevel disc degeneration and spondylosis. Mild facet degeneration diffusely.  Negative for fracture or mass lesion.  IMPRESSION: No acute intracranial abnormality.  Advanced cervical degenerative change.  Negative for fracture   Electronically Signed   By: Marlan Palauharles  Clark M.D.   On: 11/22/2014 07:52   Dg Foot Complete Left  11/22/2014   CLINICAL DATA:  Patient recently fell.  Currently with foot ulcer  EXAM: LEFT FOOT - COMPLETE 3+ VIEW  COMPARISON:  None.  FINDINGS: Frontal, oblique, and lateral views were obtained. Bones are diffusely osteoporotic. There is a tiny calcification just medial to the navicular on the oblique view which may represent a small avulsion type injury. No other evidence of potential fracture. No dislocation. There is no erosive change or bony destruction. There is narrowing of all PIP and DIP joints.  There is pes cavus.  There are scattered foci of arterial vascular calcification. No soft tissue abscess is appreciable.  IMPRESSION: Question tiny avulsion along the medial navicular. No erosive change or bony destruction. Vascular calcification noted at several sites. Pes cavus present. Bones osteoporotic.   Electronically Signed   By: Bretta BangWilliam  Woodruff M.D.   On: 11/22/2014 11:27   EXAM: General appearance:  Alert, in no apparent distress Resp: CTA  without rales, rhonchi, or wheezes Cardio:  RRR without murmur or rub GI: + BS, soft and nontender Extremities:  No edema, dressing on lateral L calf, protective boot on L Access: AVF @ LUA w/ BFR 350  HD: MWF [facility] 3h 45min 350/A1.5 2/2.25 [place] 46kg LUA AVF Heparin 2000 Aranesp 80 / wk, no other meds Cefipime/ Vanc started 12/16, ordered by SNF MD for wounds  Assessment/Plan: 1. AMS - sec to hypoglycemia, resolved, @ baseline. 2. Deep pressure ulcers - L heel/lateral calf w/ multilevel arterial occlusive disease, on Cefepime & Vancomycin; angiogram per VVS possibly next week. 3. ESRD - HD on MWF @ [facility], K 3.9.  HD today. 4. HTN/Volume - BP 134/43 on Carvedilol 12.5 mg bid, Amlodipine 10 mg qd, Hydralazine 50 mg q6h; wt 45.6 kg with UF goal 2.5 L, loss of muscle mass.  5. Anemia - Hgb 11.3, Aranesp 80 mcg on Wed. 6. Sec HPT - Ca 8.9 (10.1 corrected); no Hectorol, Renvela 2 with meals. 7. Nutrition - Alb 2.5, renal carb-mod diet, vitamin, supplement. 8. DM Type 1 - insulin. 9. DNR - requested by family.   LOS: 1 day   LYLES,CHARLES 11/23/2014,7:57 AM  Pt seen, examined and agree w A/P as above.  Vinson Moselleob Paislei Dorval MD pager  [phone]    cell [phone] 11/23/2014, 11:30 AM

## 2014-11-23 NOTE — Procedures (Signed)
I was present at this dialysis session, have reviewed the session itself and made  appropriate changes  Vinson Moselleob Joshual Terrio MD (pgr) [phone]    ([phone]) [phone] 11/23/2014, 11:52 AM

## 2014-11-23 NOTE — Consult Note (Addendum)
WOC wound consult note Reason for Consult: Consult requested for left heel, buttocks, and left leg. Pt has already been evaluated by ortho and VVS service.  Wound type: Buttocks with stage 2 wound; 1X1X.1cm.  Pink dry edge to wound; appears to be decreasing in size and healing.  Small amt yellow drainage, no odor. Left outer leg with full thickness wound; 7X2.5X.1cm; 100% red and moist, small amt yellow drainage, no odor.   Left heel with unstageable wound; 3X2.5cm; 100% slough/eschar, also 3X3cm area next to open wound which is dark purple, fluctuant, and entire left heel very painful to touch.    Pressure Ulcer POA: Yes Dressing procedure/placement/frequency: ABI results pending; VVS plans possible re-vascularization. If ABI results indicate decreased perfusion, then topical treatment will be minimally effective. Pt has heel lift boot to reduce pressure. Santyl ointment to chemically debride nonviable tissue until further plan of care is determined by VVS team. Please re-consult if further assistance is needed.  Thank-you,  Cammie Mcgeeawn Jizelle Conkey MSN, RN, CWOCN, DaleWCN-AP, CNS [phone]

## 2014-11-23 NOTE — Clinical Social Work Note (Signed)
[facility]. CSW will follow-up with patient to determine her discharge plan when medically stable. Patient has been asleep on CSW's visits to room.  Genelle BalVanessa Aryonna Gunnerson, MSW, LCSW Licensed Clinical Social Worker Clinical Social Work Department [facility] [phone]

## 2014-11-24 DIAGNOSIS — L97919 Non-pressure chronic ulcer of unspecified part of right lower leg with unspecified severity: Secondary | ICD-10-CM

## 2014-11-24 DIAGNOSIS — L97529 Non-pressure chronic ulcer of other part of left foot with unspecified severity: Secondary | ICD-10-CM

## 2014-11-24 DIAGNOSIS — I739 Peripheral vascular disease, unspecified: Secondary | ICD-10-CM

## 2014-11-24 LAB — GLUCOSE, CAPILLARY
GLUCOSE-CAPILLARY: 196 mg/dL — AB (ref 70–99)
GLUCOSE-CAPILLARY: 188 mg/dL — AB (ref 70–99)
Glucose-Capillary: 261 mg/dL — ABNORMAL HIGH (ref 70–99)
Glucose-Capillary: 335 mg/dL — ABNORMAL HIGH (ref 70–99)
Glucose-Capillary: 241 mg/dL — ABNORMAL HIGH (ref 70–99)

## 2014-11-24 MED ORDER — INSULIN GLARGINE 100 UNIT/ML ~~LOC~~ SOLN
5.0000 [IU] | Freq: Every day | SUBCUTANEOUS | Status: DC
  Administered 2014-11-24: 5 [IU] via SUBCUTANEOUS
  Filled 2014-11-24 (×2): qty 0.05

## 2014-11-24 MED ORDER — INSULIN GLARGINE 100 UNIT/ML ~~LOC~~ SOLN
8.0000 [IU] | Freq: Every day | SUBCUTANEOUS | Status: DC
  Administered 2014-11-24 – 2014-11-26 (×3): 8 [IU] via SUBCUTANEOUS
  Filled 2014-11-24 (×4): qty 0.08

## 2014-11-24 MED ORDER — RENA-VITE PO TABS
1.0000 | ORAL_TABLET | Freq: Every day | ORAL | Status: DC
  Administered 2014-11-24 – 2014-11-26 (×3): 1 via ORAL
  Filled 2014-11-24 (×5): qty 1

## 2014-11-24 MED ORDER — DEXTROSE 5 % IV SOLN: 2.0000 g | INTRAVENOUS | Status: DC

## 2014-11-24 NOTE — Progress Notes (Signed)
[facility] PROGRESS NOTE  Sharlet SalinaJeanette B Avants ZOX:[id number] DOB: 02/02/1935 DOA: 11/22/2014 PCP: Loreen FreudYvonne Lowne, DO  Assessment/Plan:    Chronic ulcers / R heel and R calf: ABI pending. Clear for arteriogram- not sure if this has been arranged for next week or not: Continue antibiotics    DM (diabetes mellitus), type 2 with renal complications: no further hypoglycemia. Resume lantus   Chronic diastolic heart failure compensated CAD, recent stents   PAD (peripheral artery disease)   Hypothyroidism   End stage renal disease- HD M/W/F   Protein-calorie malnutrition, severe   Hypoglycemia NO navicular fx per ortho  Code Status:  DNR Family Communication:   Disposition Plan:  Back to snf when stable- have to clarify when vascular study to be done  Consultants:  VVS  ortho  Procedures:     Antibiotics:  Cefepime, vanc  HPI/Subjective: Tired this AM  Objective: Filed Vitals:   11/24/14 0525  BP: 158/45  Pulse: 62  Temp: 98.7 F (37.1 C)  Resp: 18    Intake/Output Summary (Last 24 hours) at 11/24/14 0858 Last data filed at 11/23/14 2119  Gross per 24 hour  Intake    240 ml  Output   1805 ml  Net  -1565 ml   Filed Weights   11/22/14 2100 11/23/14 1137 11/23/14 2108  Weight: 45.6 kg (100 lb 8.5 oz) 45.8 kg (100 lb 15.5 oz) 45.799 kg (100 lb 15.5 oz)    Exam:   General:  Pleasant/cooperative  Cardiovascular: RRR without MGR  Respiratory: CTA without WWR  Abdomen: S, nt, nd  Ext: foot in prevlon boot  Basic Metabolic Panel:  Recent Labs Lab 11/22/14 0800  NA 138  K 3.9  CL 98  CO2 26  GLUCOSE 83  BUN 17  CREATININE 2.55*  CALCIUM 8.9   Liver Function Tests:  Recent Labs Lab 11/22/14 0800  AST 14  ALT 11  ALKPHOS 233*  BILITOT 0.4  PROT 6.4  ALBUMIN 2.5*   No results for input(s): LIPASE, AMYLASE in the last 168 hours. No results for input(s): AMMONIA in the last 168 hours. CBC:  Recent Labs Lab 11/22/14 0800  WBC  9.3  HGB 11.3*  HCT 35.8*  MCV 101.7*  PLT 165   Cardiac Enzymes:  Recent Labs Lab 11/22/14 0800  TROPONINI <0.30   BNP (last 3 results)  Recent Labs  08/20/14 0323  PROBNP 9463.0*   CBG:  Recent Labs Lab 11/23/14 1214 11/23/14 1731 11/23/14 2112 11/24/14 0132 11/24/14 0747  GLUCAP 151* 249* 180* 196* 261*    No results found for this or any previous visit (from the past 240 hour(s)).   Studies: Dg Pelvis 1-2 Views  11/22/2014   CLINICAL DATA:  79 year old female with left pelvic fractures.  EXAM: PELVIS - 1-2 VIEW  COMPARISON:  07/23/2014.  FINDINGS: Previously demonstrated fractures of the left superior and inferior pubic rami appear to be healing. There continues to be a displaced comminuted fracture of the left acetabulum, better demonstrated on prior CT scan 07/23/2014. Fracture lines appear slightly blurred, suggesting some healing, however, there are still multiple clearly define fracture lines evident. Left femoral head again projects within the fractured acetabulum, with some medial displacement, similar to the prior examination. No new acute displaced fracture identified. Three fixation screws again are noted in the left femoral neck, with old healed left femoral neck fracture.  IMPRESSION: 1. The appearance of the bony pelvis is very similar to prior study 07/23/2014, with a  small amount of bony healing in the displaced comminuted left acetabular fracture, as well as the left inferior and superior pubic rami fractures, as above.   Electronically Signed   By: Trudie Reedaniel  Entrikin M.D.   On: 11/22/2014 11:36   Dg Foot Complete Left  11/22/2014   CLINICAL DATA:  Patient recently fell.  Currently with foot ulcer  EXAM: LEFT FOOT - COMPLETE 3+ VIEW  COMPARISON:  None.  FINDINGS: Frontal, oblique, and lateral views were obtained. Bones are diffusely osteoporotic. There is a tiny calcification just medial to the navicular on the oblique view which may represent a small avulsion  type injury. No other evidence of potential fracture. No dislocation. There is no erosive change or bony destruction. There is narrowing of all PIP and DIP joints.  There is pes cavus.  There are scattered foci of arterial vascular calcification. No soft tissue abscess is appreciable.  IMPRESSION: Question tiny avulsion along the medial navicular. No erosive change or bony destruction. Vascular calcification noted at several sites. Pes cavus present. Bones osteoporotic.   Electronically Signed   By: Bretta BangWilliam  Woodruff M.D.   On: 11/22/2014 11:27    Scheduled Meds: . amLODipine  10 mg Oral Daily  . aspirin EC  81 mg Oral Daily  . atorvastatin  20 mg Oral q1800  . carvedilol  12.5 mg Oral BID WC  . ceFEPIme (MAXIPIME) 2 GM IVP  2 g Intravenous Q M,W,F-HD  . cloNIDine  0.1 mg Oral Q2200  . collagenase   Topical Daily  . [START ON 11/28/2014] darbepoetin (ARANESP) injection - DIALYSIS  80 mcg Intravenous Q Wed-HD  . dextrose  1 ampule Intravenous Once  . docusate sodium  100 mg Oral BID  . famotidine  20 mg Oral Daily  . feeding supplement (PRO-STAT SUGAR FREE 64)  30 mL Oral BID BM  . feeding supplement (RESOURCE BREEZE)  1 Container Oral BID BM  . gabapentin  100 mg Oral QHS  . heparin  5,000 Units Subcutaneous 3 times per day  . hydrALAZINE  50 mg Oral 4 times per day  . insulin aspart  0-5 Units Subcutaneous QHS  . insulin aspart  0-9 Units Subcutaneous TID WC  . insulin glargine  5 Units Subcutaneous QHS  . isosorbide mononitrate  20 mg Oral BID  . levothyroxine  88 mcg Oral QAC breakfast  . senna-docusate  2 tablet Oral BID  . sevelamer carbonate  1,600 mg Oral TID WC  . ticagrelor  90 mg Oral BID  . vancomycin (VANCOCIN) 500 mg IVPB  500 mg Intravenous Q M,W,F-HD  . vitamin C  500 mg Oral Daily   Continuous Infusions:   Time spent: 25 minutes  Hugh Garrow  [facility] [email or web address], password [email or web address] 11/24/2014, 8:58 AM  LOS: 2 days

## 2014-11-24 NOTE — Progress Notes (Signed)
Chart reviewed.   [facility] PROGRESS NOTE  Nicole [name] DGU:[id number] DOB: [birth year] DOA: 11/22/2014 PCP: Loreen FreudYvonne Lowne, DO  Assessment/Plan:  Principal Problem:   Chronic ulcers / R heel and R calf: ABI pending. Clear for arteriogram from my perspective. Continue antibiotics Active Problems:   DM (diabetes mellitus), type 2 with renal complications: no further hypoglycemia. Will resume lantus at lower dose   Chronic diastolic heart failure compensated CAD, recent stents   PAD (peripheral artery disease)   Hypothyroidism   End stage renal disease   Protein-calorie malnutrition, severe   Hypoglycemia NO navicular fx per ortho  Code Status:  DNR Family Communication:   Disposition Plan:  Back to snf when stable  Consultants:  VVS  ortho  Procedures:     Antibiotics:  Cefepime, vanc  HPI/Subjective: Feels loopy from pain meds  Objective: Filed Vitals:   11/23/14 2108  BP: 148/52  Pulse: 65  Temp: 98.5 F (36.9 C)  Resp: 18    Intake/Output Summary (Last 24 hours) at 11/24/14 0046 Last data filed at 11/23/14 2119  Gross per 24 hour  Intake    240 ml  Output   1805 ml  Net  -1565 ml   Filed Weights   11/22/14 2100 11/23/14 1137 11/23/14 2108  Weight: 45.6 kg (100 lb 8.5 oz) 45.8 kg (100 lb 15.5 oz) 45.799 kg (100 lb 15.5 oz)    Exam:   General:  In chair  Cardiovascular: RRR without MGR  Respiratory: CTA without WWR  Abdomen: S, nt, nd  Ext: foot in prevlon boot  Basic Metabolic Panel:  Recent Labs Lab 11/22/14 0800  NA 138  K 3.9  CL 98  CO2 26  GLUCOSE 83  BUN 17  CREATININE 2.55*  CALCIUM 8.9   Liver Function Tests:  Recent Labs Lab 11/22/14 0800  AST 14  ALT 11  ALKPHOS 233*  BILITOT 0.4  PROT 6.4  ALBUMIN 2.5*   No results for input(s): LIPASE, AMYLASE in the last 168 hours. No results for input(s): AMMONIA in the last 168 hours. CBC:  Recent Labs Lab 11/22/14 0800  WBC 9.3  HGB 11.3*  HCT  35.8*  MCV 101.7*  PLT 165   Cardiac Enzymes:  Recent Labs Lab 11/22/14 0800  TROPONINI <0.30   BNP (last 3 results)  Recent Labs  08/20/14 0323  PROBNP 9463.0*   CBG:  Recent Labs Lab 11/23/14 0315 11/23/14 0756 11/23/14 1214 11/23/14 1731 11/23/14 2112  GLUCAP 528* 215* 151* 249* 180*    No results found for this or any previous visit (from the past 240 hour(s)).   Studies: Dg Chest 1 View  11/22/2014   CLINICAL DATA:  Altered mental status  EXAM: CHEST - 1 VIEW  COMPARISON:  August 20, 2014  FINDINGS: There is no edema or consolidation. Heart is mildly enlarged with pulmonary vascularity within normal limits. There is atherosclerotic change throughout the aorta. No adenopathy. There is degenerative change in the thoracic spine. Bones are osteoporotic.  IMPRESSION: No edema or consolidation.  Heart prominent but stable.   Electronically Signed   By: Bretta BangWilliam  Woodruff M.D.   On: 11/22/2014 07:51   Dg Pelvis 1-2 Views  11/22/2014   CLINICAL DATA:  79 year old female with left pelvic fractures.  EXAM: PELVIS - 1-2 VIEW  COMPARISON:  07/23/2014.  FINDINGS: Previously demonstrated fractures of the left superior and inferior pubic rami appear to be healing. There continues to be a displaced comminuted fracture of the left  acetabulum, better demonstrated on prior CT scan 07/23/2014. Fracture lines appear slightly blurred, suggesting some healing, however, there are still multiple clearly define fracture lines evident. Left femoral head again projects within the fractured acetabulum, with some medial displacement, similar to the prior examination. No new acute displaced fracture identified. Three fixation screws again are noted in the left femoral neck, with old healed left femoral neck fracture.  IMPRESSION: 1. The appearance of the bony pelvis is very similar to prior study 07/23/2014, with a small amount of bony healing in the displaced comminuted left acetabular fracture, as  well as the left inferior and superior pubic rami fractures, as above.   Electronically Signed   By: Trudie Reed M.D.   On: 11/22/2014 11:36   Ct Head Wo Contrast  11/22/2014   CLINICAL DATA:  Found on floor.  Decreased responsiveness.  ESRD.  EXAM: CT HEAD WITHOUT CONTRAST  CT CERVICAL SPINE WITHOUT CONTRAST  TECHNIQUE: Multidetector CT imaging of the head and cervical spine was performed following the standard protocol without intravenous contrast. Multiplanar CT image reconstructions of the cervical spine were also generated.  COMPARISON:  07/22/2014  FINDINGS: CT HEAD FINDINGS  Ventricle size is within normal limits.  Mild atrophy is stable.  Negative for acute infarct.  Negative for hemorrhage or mass.  Negative for skull fracture. Hyperdense vitreous in the right globe consistent with prior surgical procedure, unchanged from the prior study.  CT CERVICAL SPINE FINDINGS  Cervical kyphosis. Advanced multilevel disc degeneration and spondylosis. Mild facet degeneration diffusely.  Negative for fracture or mass lesion.  IMPRESSION: No acute intracranial abnormality.  Advanced cervical degenerative change.  Negative for fracture   Electronically Signed   By: Marlan Palau M.D.   On: 11/22/2014 07:52   Ct Cervical Spine Wo Contrast  11/22/2014   CLINICAL DATA:  Found on floor.  Decreased responsiveness.  ESRD.  EXAM: CT HEAD WITHOUT CONTRAST  CT CERVICAL SPINE WITHOUT CONTRAST  TECHNIQUE: Multidetector CT imaging of the head and cervical spine was performed following the standard protocol without intravenous contrast. Multiplanar CT image reconstructions of the cervical spine were also generated.  COMPARISON:  07/22/2014  FINDINGS: CT HEAD FINDINGS  Ventricle size is within normal limits.  Mild atrophy is stable.  Negative for acute infarct.  Negative for hemorrhage or mass.  Negative for skull fracture. Hyperdense vitreous in the right globe consistent with prior surgical procedure, unchanged from the  prior study.  CT CERVICAL SPINE FINDINGS  Cervical kyphosis. Advanced multilevel disc degeneration and spondylosis. Mild facet degeneration diffusely.  Negative for fracture or mass lesion.  IMPRESSION: No acute intracranial abnormality.  Advanced cervical degenerative change.  Negative for fracture   Electronically Signed   By: Marlan Palau M.D.   On: 11/22/2014 07:52   Dg Foot Complete Left  11/22/2014   CLINICAL DATA:  Patient recently fell.  Currently with foot ulcer  EXAM: LEFT FOOT - COMPLETE 3+ VIEW  COMPARISON:  None.  FINDINGS: Frontal, oblique, and lateral views were obtained. Bones are diffusely osteoporotic. There is a tiny calcification just medial to the navicular on the oblique view which may represent a small avulsion type injury. No other evidence of potential fracture. No dislocation. There is no erosive change or bony destruction. There is narrowing of all PIP and DIP joints.  There is pes cavus.  There are scattered foci of arterial vascular calcification. No soft tissue abscess is appreciable.  IMPRESSION: Question tiny avulsion along the medial navicular. No erosive  change or bony destruction. Vascular calcification noted at several sites. Pes cavus present. Bones osteoporotic.   Electronically Signed   By: Bretta BangWilliam  Woodruff M.D.   On: 11/22/2014 11:27    Scheduled Meds: . amLODipine  10 mg Oral Daily  . aspirin EC  81 mg Oral Daily  . atorvastatin  20 mg Oral q1800  . carvedilol  12.5 mg Oral BID WC  . ceFEPIme (MAXIPIME) 2 GM IVP  2 g Intravenous Q M,W,F-HD  . cloNIDine  0.1 mg Oral Q2200  . collagenase   Topical Daily  . [START ON 11/28/2014] darbepoetin (ARANESP) injection - DIALYSIS  80 mcg Intravenous Q Wed-HD  . dextrose  1 ampule Intravenous Once  . docusate sodium  100 mg Oral BID  . famotidine  20 mg Oral Daily  . feeding supplement (PRO-STAT SUGAR FREE 64)  30 mL Oral BID BM  . feeding supplement (RESOURCE BREEZE)  1 Container Oral BID BM  . gabapentin  100 mg  Oral QHS  . heparin  5,000 Units Subcutaneous 3 times per day  . hydrALAZINE  50 mg Oral 4 times per day  . insulin aspart  0-5 Units Subcutaneous QHS  . insulin aspart  0-9 Units Subcutaneous TID WC  . isosorbide mononitrate  20 mg Oral BID  . levothyroxine  88 mcg Oral QAC breakfast  . senna-docusate  2 tablet Oral BID  . sevelamer carbonate  1,600 mg Oral TID WC  . ticagrelor  90 mg Oral BID  . vancomycin (VANCOCIN) 500 mg IVPB  500 mg Intravenous Q M,W,F-HD  . vitamin C  500 mg Oral Daily   Continuous Infusions:   Time spent: 25 minutes  Heather Streeper L  [facility] [email or web address], password [email or web address] 11/24/2014, 12:46 AM  LOS: 2 days

## 2014-11-24 NOTE — Progress Notes (Signed)
VASCULAR LAB PRELIMINARY  ARTERIAL  ABI completed:    RIGHT    LEFT    PRESSURE WAVEFORM  PRESSURE WAVEFORM  BRACHIAL 157 triphasic BRACHIAL graft   DP   DP    AT 59 monophasic AT 46 Severely dampened monophasic  PT 69 monophasic PT 17 monophasic  PER   PER    GREAT TOE <40 NA GREAT TOE <40 NA    RIGHT LEFT  ABI 0.44 0.29     Chuckie Mccathern, RVT 11/24/2014, 2:05 PM

## 2014-11-24 NOTE — Progress Notes (Signed)
[facility] Progress Note  Assessment/Plan: 1. AMS - sec to hypoglycemia- resolved- baseline. 2. Deep pressure ulcers - L heel/lateral calf w/ multilevel arterial occlusive disease, on Cefepime & Vancomycin; angiogram per [facility] possibly next week. ABIs pending 3. ESRD - HD on MWF @ AF, K 3.9.12/17 - next HD due Monday; looks like she had some prolonged bleeding and had gel foam used on distal site on AVF;  She had a f'gram 12/1 with 50% inflow stenosis treated to 10% - AF actually decreased from 377 (November) to 284 12/7 post intervention and she was scheduled for another f'gram with [facility]12/[facility] but was admitted to the hospital;  AF HAD been running 428 (Oct) and 600 - 700 previously; kinetics ok in spite of this; watch closely 4. HTN/Volume - BP 140 - 160s  on Carvedilol 12.5 mg bid, Amlodipine 10 mg qd, Hydralazine 50 mg q6h; net UF 1.8 Friday - titrate EDW 5. Anemia - Hgb 11.3, Aranesp 80 mcg on Wed. 6. Sec HPT - Ca 8.9 (10.1 corrected); no Hectorol, Renvela 2 with meals. 7. Protein calorie malnutrition - Alb 2.5, renal carb-mod diet, vitamin, supplement ordered 8. DM - per primary- stressed it is important for her to eat; we can treat high BS 9. DNR - requested by family.  Nicole SliderMartha B Bergman, PA-C [facility] Beeper [phone] 11/24/2014,9:43 AM  LOS: 2 days   Pt seen, examined and agree w A/P as above.  Nicole Moselleob Nicole Packard MD pager [phone]    cell [phone] 11/25/2014, 7:05 AM    Subjective:   Hurts when she moves her heels; mostly drinking liquids, not eating much solids; worried about BS getting too high  Objective Filed Vitals:   11/23/14 1240 11/23/14 1700 11/23/14 2108 11/24/14 0525  BP: 165/48 160/50 148/52 158/45  Pulse:  70 65 62  Temp:  98 F (36.7 C) 98.5 F (36.9 C) 98.7 F (37.1 C)  TempSrc:  Oral Oral Oral  Resp:  17 18 18   Height:      Weight:   45.799 kg (100 lb 15.5 oz)   SpO2:  98% 100% 100%   Physical Exam General: NAD  emaciated Heart: RRR Lungs: no rales Abdomen:  Soft NT Extremities: no edema, extreme muscle wasting left heel in boot Dialysis Access: left upper AVF  + bruit, skin tear under dressing on AVF - had gel foam over distal site on AVF  Dialysis Orders: MWF [facility] 3h 45min 350/A1.5 2/2.25 Bath 46kg LUA AVF Heparin 2000 Aranesp 80 / wk, no other meds Cefipime/ Vanc started 12/16, ordered by SNF MD for wounds  Additional Objective Labs: Basic Metabolic Panel:  Recent Labs Lab 11/22/14 0800  NA 138  K 3.9  CL 98  CO2 26  GLUCOSE 83  BUN 17  CREATININE 2.55*  CALCIUM 8.9   Liver Function Tests:  Recent Labs Lab 11/22/14 0800  AST 14  ALT 11  ALKPHOS 233*  BILITOT 0.4  PROT 6.4  ALBUMIN 2.5*  CBC:  Recent Labs Lab 11/22/14 0800  WBC 9.3  HGB 11.3*  HCT 35.8*  MCV 101.7*  PLT 165     Cardiac Enzymes:  Recent Labs Lab 11/22/14 0800  TROPONINI <0.30   CBG:  Recent Labs Lab 11/23/14 1214 11/23/14 1731 11/23/14 2112 11/24/14 0132 11/24/14 0747  GLUCAP 151* 249* 180* 196* 261*  Studies/Results: Dg Pelvis 1-2 Views  11/22/2014   CLINICAL DATA:  79 year old female with left pelvic fractures.  EXAM: PELVIS - 1-2 VIEW  COMPARISON:  07/23/2014.  FINDINGS: Previously demonstrated fractures of the left superior and inferior pubic rami appear to be healing. There continues to be a displaced comminuted fracture of the left acetabulum, better demonstrated on prior CT scan 07/23/2014. Fracture lines appear slightly blurred, suggesting some healing, however, there are still multiple clearly define fracture lines evident. Left femoral head again projects within the fractured acetabulum, with some medial displacement, similar to the prior examination. No new acute displaced fracture identified. Three fixation screws again are noted in the left femoral neck, with old healed left femoral neck fracture.  IMPRESSION: 1. The appearance of the bony pelvis is very  similar to prior study 07/23/2014, with a small amount of bony healing in the displaced comminuted left acetabular fracture, as well as the left inferior and superior pubic rami fractures, as above.   Electronically Signed   By: Trudie Reedaniel  Entrikin M.D.   On: 11/22/2014 11:36   Dg Foot Complete Left  11/22/2014   CLINICAL DATA:  Patient recently fell.  Currently with foot ulcer  EXAM: LEFT FOOT - COMPLETE 3+ VIEW  COMPARISON:  None.  FINDINGS: Frontal, oblique, and lateral views were obtained. Bones are diffusely osteoporotic. There is a tiny calcification just medial to the navicular on the oblique view which may represent a small avulsion type injury. No other evidence of potential fracture. No dislocation. There is no erosive change or bony destruction. There is narrowing of all PIP and DIP joints.  There is pes cavus.  There are scattered foci of arterial vascular calcification. No soft tissue abscess is appreciable.  IMPRESSION: Question tiny avulsion along the medial navicular. No erosive change or bony destruction. Vascular calcification noted at several sites. Pes cavus present. Bones osteoporotic.   Electronically Signed   By: Bretta BangWilliam  Woodruff M.D.   On: 11/22/2014 11:27   Medications:   . amLODipine  10 mg Oral Daily  . aspirin EC  81 mg Oral Daily  . atorvastatin  20 mg Oral q1800  . carvedilol  12.5 mg Oral BID WC  . ceFEPIme (MAXIPIME) 2 GM IVP  2 g Intravenous Q M,W,F-HD  . cloNIDine  0.1 mg Oral Q2200  . collagenase   Topical Daily  . [START ON 11/28/2014] darbepoetin (ARANESP) injection - DIALYSIS  80 mcg Intravenous Q Wed-HD  . dextrose  1 ampule Intravenous Once  . docusate sodium  100 mg Oral BID  . famotidine  20 mg Oral Daily  . feeding supplement (PRO-STAT SUGAR FREE 64)  30 mL Oral BID BM  . feeding supplement (RESOURCE BREEZE)  1 Container Oral BID BM  . gabapentin  100 mg Oral QHS  . heparin  5,000 Units Subcutaneous 3 times per day  . hydrALAZINE  50 mg Oral 4 times per  day  . insulin aspart  0-5 Units Subcutaneous QHS  . insulin aspart  0-9 Units Subcutaneous TID WC  . insulin glargine  8 Units Subcutaneous QHS  . isosorbide mononitrate  20 mg Oral BID  . levothyroxine  88 mcg Oral QAC breakfast  . senna-docusate  2 tablet Oral BID  . sevelamer carbonate  1,600 mg Oral TID WC  . ticagrelor  90 mg Oral BID  . vancomycin (VANCOCIN) 500 mg IVPB  500 mg Intravenous Q M,W,F-HD  . vitamin C  500 mg Oral Daily

## 2014-11-25 LAB — RENAL FUNCTION PANEL
ALBUMIN: 2.2 g/dL — AB (ref 3.5–5.2)
Anion gap: 14 (ref 5–15)
BUN: 41 mg/dL — AB (ref 6–23)
CHLORIDE: 94 meq/L — AB (ref 96–112)
CO2: 24 mEq/L (ref 19–32)
Calcium: 8.5 mg/dL (ref 8.4–10.5)
Creatinine, Ser: 3.73 mg/dL — ABNORMAL HIGH (ref 0.50–1.10)
GFR calc Af Amer: 12 mL/min — ABNORMAL LOW (ref >90)
GFR calc non Af Amer: 11 mL/min — ABNORMAL LOW (ref >90)
GLUCOSE: 277 mg/dL — AB (ref 70–99)
POTASSIUM: 5.1 meq/L (ref 3.7–5.3)
Phosphorus: 3.2 mg/dL (ref 2.3–4.6)
Sodium: 132 mEq/L — ABNORMAL LOW (ref 137–147)

## 2014-11-25 LAB — GLUCOSE, CAPILLARY
GLUCOSE-CAPILLARY: 240 mg/dL — AB (ref 70–99)
Glucose-Capillary: 154 mg/dL — ABNORMAL HIGH (ref 70–99)
Glucose-Capillary: 249 mg/dL — ABNORMAL HIGH (ref 70–99)
Glucose-Capillary: 261 mg/dL — ABNORMAL HIGH (ref 70–99)
Glucose-Capillary: 329 mg/dL — ABNORMAL HIGH (ref 70–99)

## 2014-11-25 LAB — CBC
HEMATOCRIT: 30.9 % — AB (ref 36.0–46.0)
Hemoglobin: 9.5 g/dL — ABNORMAL LOW (ref 12.0–15.0)
MCH: 32.1 pg (ref 26.0–34.0)
MCHC: 30.7 g/dL (ref 30.0–36.0)
MCV: 104.4 fL — ABNORMAL HIGH (ref 78.0–100.0)
Platelets: 172 10*3/uL (ref 150–400)
RBC: 2.96 MIL/uL — ABNORMAL LOW (ref 3.87–5.11)
RDW: 15.9 % — ABNORMAL HIGH (ref 11.5–15.5)
WBC: 8.8 10*3/uL (ref 4.0–10.5)

## 2014-11-25 MED ORDER — HEPARIN SODIUM (PORCINE) 1000 UNIT/ML DIALYSIS: 1000.0000 [IU] | INTRAMUSCULAR | Status: DC | PRN

## 2014-11-25 MED ORDER — LIDOCAINE-PRILOCAINE 2.5-2.5 % EX CREA: 1.0000 "application " | TOPICAL_CREAM | CUTANEOUS | Status: DC | PRN

## 2014-11-25 MED ORDER — DEXTROSE 5 % IV SOLN
2.0000 g | INTRAVENOUS | Status: DC
  Administered 2014-11-25: 2 g via INTRAVENOUS
  Filled 2014-11-25 (×2): qty 2

## 2014-11-25 MED ORDER — SODIUM CHLORIDE 0.9 % IV SOLN: 100.0000 mL | INTRAVENOUS | Status: DC | PRN

## 2014-11-25 MED ORDER — LIDOCAINE HCL (PF) 1 % IJ SOLN: 5.0000 mL | INTRAMUSCULAR | Status: DC | PRN

## 2014-11-25 MED ORDER — NEPRO/CARBSTEADY PO LIQD
237.0000 mL | ORAL | Status: DC | PRN
  Filled 2014-11-25: qty 237

## 2014-11-25 MED ORDER — PENTAFLUOROPROP-TETRAFLUOROETH EX AERO: 1.0000 "application " | INHALATION_SPRAY | CUTANEOUS | Status: DC | PRN

## 2014-11-25 MED ORDER — HEPARIN SODIUM (PORCINE) 1000 UNIT/ML DIALYSIS: 2000.0000 [IU] | Freq: Once | INTRAMUSCULAR | Status: DC

## 2014-11-25 MED ORDER — ALTEPLASE 2 MG IJ SOLR
2.0000 mg | Freq: Once | INTRAMUSCULAR | Status: DC | PRN
  Filled 2014-11-25: qty 2

## 2014-11-25 MED ORDER — SODIUM CHLORIDE 0.9 % IV SOLN
500.0000 mg | INTRAVENOUS | Status: DC
  Filled 2014-11-25: qty 500

## 2014-11-25 NOTE — Progress Notes (Signed)
[facility] Progress Note  Assessment/Plan: 1. AMS - sec to hypoglycemia- resolved- MS baseline. 2. Deep pressure ulcers - L heel/lateral calf w/ multilevel arterial occlusive disease, on Cefepime & Vancomycin; angiogram per [facility] possibly next week. ABI left 0.29, right 0.44 3. ESRD - HD on MWF @ AF, K 5.1  next HD due Monday but dialyzing Sunday due to holiday schedule looks like she had some prolonged bleeding and had gel foam used on distal site on AVF; She had a f'gram 12/1 with 50% inflow stenosis treated to 10% - AF actually decreased from 377 (November) to 284 12/7 post intervention and she was scheduled for another f'gram with [facility] but was admitted to the hospital; AF HAD been running 428 (Oct) and 600 - 700 previously; kinetics ok in spite of this; watch closely changed bath to 2 K today 4. HTN/Volume - BP 120 - 150s on Carvedilol 12.5 mg bid, Amlodipine 10 mg qd, Hydralazine 50 mg q6h; net UF 1.8 Friday - titrate EDW 5. Anemia - Hgb 11.3 down to 9.5 , Aranesp 80 mcg on Wed. 6. Sec HPT - P 3.2; no Hectorol, Renvela 2 with meals. 7. Protein calorie malnutrition - Alb 2.5, renal carb-mod diet, vitamin, supplement ordered- intake very poor - liberalize diet to regular 8. DM - per primary- stressed it is important for her to eat; we can treat high BS 9. DNR - requested by family.  Nicole SliderMartha B Bergman, PA-C [facility] Beeper [phone] 11/25/2014,8:36 AM  LOS: 3 days   Pt seen, examined and agree w A/P as above. Stable from renal standpoint.  HD today.  Vinson Moselleob Daelon Dunivan MD pager [phone]    cell [phone] 11/25/2014, 11:45 AM    Subjective:   Ate very little last night; nothing yet today  Objective Filed Vitals:   11/24/14 2100 11/25/14 0600 11/25/14 0734 11/25/14 0800  BP: 144/33 138/50 151/52 126/44  Pulse: 59 62 66 58  Temp: 98.2 F (36.8 C) 98.4 F (36.9 C) 98.5 F (36.9 C)   TempSrc: Oral Oral Oral   Resp: 18 18 16 14   Height:       Weight: 45.3 kg (99 lb 13.9 oz)  48.2 kg (106 lb 4.2 oz)   SpO2: 100% 100% 100%    Physical Exam General: emaciated, weak but NAD Heart: RRR with occ ectopy Lungs: no rales Abdomen: soft NT Extremities: no LE edema; left foot in boot Dialysis Access: left upper AVF patent  Dialysis Orders: MWF [facility] 3h 45min 350/A1.5 2/2.25 Bath 46kg LUA AVF Heparin 2000 Aranesp 80 / wk, no other meds Cefipime/ Vanc started 12/16, ordered by SNF MD for wounds  Additional Objective Labs: Basic Metabolic Panel:  Recent Labs Lab 11/22/14 0800 11/25/14 0739  NA 138 132*  K 3.9 5.1  CL 98 94*  CO2 26 24  GLUCOSE 83 277*  BUN 17 41*  CREATININE 2.55* 3.73*  CALCIUM 8.9 8.5  PHOS  --  3.2   Liver Function Tests:  Recent Labs Lab 11/22/14 0800 11/25/14 0739  AST 14  --   ALT 11  --   ALKPHOS 233*  --   BILITOT 0.4  --   PROT 6.4  --   ALBUMIN 2.5* 2.2*   CBC:  Recent Labs Lab 11/22/14 0800 11/25/14 0739  WBC 9.3 8.8  HGB 11.3* 9.5*  HCT 35.8* 30.9*  MCV 101.7* 104.4*  PLT 165 172   Cardiac Enzymes:  Recent Labs Lab 11/22/14 0800  TROPONINI <  0.30   CBG:  Recent Labs Lab 11/24/14 0747 11/24/14 1221 11/24/14 1720 11/24/14 2121 11/25/14 0635  GLUCAP 261* 335* 241* 188* 261*  Medications:   . amLODipine  10 mg Oral Daily  . aspirin EC  81 mg Oral Daily  . atorvastatin  20 mg Oral q1800  . carvedilol  12.5 mg Oral BID WC  . [START ON 11/26/2014] ceFEPIme (MAXIPIME) 2 GM IVP  2 g Intravenous Q M,W,F-2000  . cloNIDine  0.1 mg Oral Q2200  . collagenase   Topical Daily  . [START ON 11/28/2014] darbepoetin (ARANESP) injection - DIALYSIS  80 mcg Intravenous Q Wed-HD  . dextrose  1 ampule Intravenous Once  . docusate sodium  100 mg Oral BID  . famotidine  20 mg Oral Daily  . feeding supplement (PRO-STAT SUGAR FREE 64)  30 mL Oral BID BM  . feeding supplement (RESOURCE BREEZE)  1 Container Oral BID BM  . gabapentin  100 mg Oral QHS  . heparin   2,000 Units Dialysis Once in dialysis  . heparin  5,000 Units Subcutaneous 3 times per day  . hydrALAZINE  50 mg Oral 4 times per day  . insulin aspart  0-5 Units Subcutaneous QHS  . insulin aspart  0-9 Units Subcutaneous TID WC  . insulin glargine  8 Units Subcutaneous QHS  . isosorbide mononitrate  20 mg Oral BID  . levothyroxine  88 mcg Oral QAC breakfast  . multivitamin  1 tablet Oral QHS  . senna-docusate  2 tablet Oral BID  . sevelamer carbonate  1,600 mg Oral TID WC  . ticagrelor  90 mg Oral BID  . vancomycin (VANCOCIN) 500 mg IVPB  500 mg Intravenous Q M,W,F-HD  . vitamin C  500 mg Oral Daily

## 2014-11-25 NOTE — Progress Notes (Addendum)
[facility] PROGRESS NOTE  Nicole [name] HQI:[id number] DOB: 01/12/1935 DOA: 11/22/2014 PCP: Nicole FreudYvonne Lowne, DO  Assessment/Plan:    Chronic ulcers / R heel and R calf: ABI pending. Clear for arteriogram- not sure if this has been arranged for next week or not: will call office of vascular on Monday. Continue antibiotics -ADDENDUM: plan for arteriogram NEXT Monday 12/28    DM (diabetes mellitus), type 2 with renal complications: no further hypoglycemia. Resume lantus   Chronic diastolic heart failure compensated CAD, recent stents   PAD (peripheral artery disease)   Hypothyroidism   End stage renal disease- HD M/W/F   Protein-calorie malnutrition, severe   Hypoglycemia NO navicular fx per ortho  Code Status:  DNR Family Communication:   Disposition Plan:  Back to snf in AM  Consultants:  [facility]VS  ortho  Procedures:     Antibiotics:  Cefepime, vanc  HPI/Subjective: In dialysis  Objective: Filed Vitals:   11/25/14 0800  BP: 126/44  Pulse: 58  Temp:   Resp: 14    Intake/Output Summary (Last 24 hours) at 11/25/14 0859 Last data filed at 11/24/14 1700  Gross per 24 hour  Intake    360 ml  Output      0 ml  Net    360 ml   Filed Weights   11/23/14 2108 11/24/14 2100 11/25/14 0734  Weight: 45.799 kg (100 lb 15.5 oz) 45.3 kg (99 lb 13.9 oz) 48.2 kg (106 lb 4.2 oz)    Exam:   General:  Pleasant/cooperative  Cardiovascular: RRR without MGR  Respiratory: CTA without WWR  Abdomen: S, nt, nd  Ext: foot in prevlon boot  Basic Metabolic Panel:  Recent Labs Lab 11/22/14 0800 11/25/14 0739  NA 138 132*  K 3.9 5.1  CL 98 94*  CO2 26 24  GLUCOSE 83 277*  BUN 17 41*  CREATININE 2.55* 3.73*  CALCIUM 8.9 8.5  PHOS  --  3.2   Liver Function Tests:  Recent Labs Lab 11/22/14 0800 11/25/14 0739  AST 14  --   ALT 11  --   ALKPHOS 233*  --   BILITOT 0.4  --   PROT 6.4  --   ALBUMIN 2.5* 2.2*   No results for input(s): LIPASE,  AMYLASE in the last 168 hours. No results for input(s): AMMONIA in the last 168 hours. CBC:  Recent Labs Lab 11/22/14 0800 11/25/14 0739  WBC 9.3 8.8  HGB 11.3* 9.5*  HCT 35.8* 30.9*  MCV 101.7* 104.4*  PLT 165 172   Cardiac Enzymes:  Recent Labs Lab 11/22/14 0800  TROPONINI <0.30   BNP (last 3 results)  Recent Labs  08/20/14 0323  PROBNP 9463.0*   CBG:  Recent Labs Lab 11/24/14 0747 11/24/14 1221 11/24/14 1720 11/24/14 2121 11/25/14 0635  GLUCAP 261* 335* 241* 188* 261*    No results found for this or any previous visit (from the past 240 hour(s)).   Studies: No results found.  Scheduled Meds: . amLODipine  10 mg Oral Daily  . aspirin EC  81 mg Oral Daily  . atorvastatin  20 mg Oral q1800  . carvedilol  12.5 mg Oral BID WC  . [START ON 11/26/2014] ceFEPIme (MAXIPIME) 2 GM IVP  2 g Intravenous Q M,W,F-2000  . cloNIDine  0.1 mg Oral Q2200  . collagenase   Topical Daily  . [START ON 11/28/2014] darbepoetin (ARANESP) injection - DIALYSIS  80 mcg Intravenous Q Wed-HD  . dextrose  1 ampule Intravenous Once  .  docusate sodium  100 mg Oral BID  . famotidine  20 mg Oral Daily  . feeding supplement (PRO-STAT SUGAR FREE 64)  30 mL Oral BID BM  . feeding supplement (RESOURCE BREEZE)  1 Container Oral BID BM  . gabapentin  100 mg Oral QHS  . heparin  2,000 Units Dialysis Once in dialysis  . heparin  5,000 Units Subcutaneous 3 times per day  . hydrALAZINE  50 mg Oral 4 times per day  . insulin aspart  0-5 Units Subcutaneous QHS  . insulin aspart  0-9 Units Subcutaneous TID WC  . insulin glargine  8 Units Subcutaneous QHS  . isosorbide mononitrate  20 mg Oral BID  . levothyroxine  88 mcg Oral QAC breakfast  . multivitamin  1 tablet Oral QHS  . senna-docusate  2 tablet Oral BID  . sevelamer carbonate  1,600 mg Oral TID WC  . ticagrelor  90 mg Oral BID  . vancomycin (VANCOCIN) 500 mg IVPB  500 mg Intravenous Q M,W,F-HD  . vitamin C  500 mg Oral Daily    Continuous Infusions:   Time spent: 25 minutes  Nicole [name]  [facility] [email or web address], password [email or web address] 11/25/2014, 8:59 AM  LOS: 3 days

## 2014-11-25 NOTE — Progress Notes (Signed)
ANTIBIOTIC CONSULT NOTE - FOLLOW UP  Pharmacy Consult for Vancomycin and Cefepime Indication: severe cellulitis, ulcers  Allergies  Allergen Reactions  . Ibuprofen     REACTION: edema---thyroid problems  . Prednisone     Causes severe fluid retention  . Procrit [Epoetin Alfa]     Per Dr. Eliane DecreePatel's note: she apparently has had an "adverse reaction" to Procrit after which she gets diffuse arthralgias and myalgias.    Patient Measurements: Height: 5' 4.17" (163 cm) Weight: 100 lb 8.5 oz (45.6 kg) IBW/kg (Calculated) : 55.1 Adjusted Body Weight:   Vital Signs: Temp: 98.1 F (36.7 C) (12/20 1208) Temp Source: Oral (12/20 1208) BP: 172/45 mmHg (12/20 1208) Pulse Rate: 63 (12/20 1208) Intake/Output from previous day: 12/19 0701 - 12/20 0700 In: 360 [P.O.:360] Out: -  Intake/Output from this shift: Total I/O In: 120 [P.O.:120] Out: 2016 [Other:2016]  Labs:  Recent Labs  11/25/14 0739  WBC 8.8  HGB 9.5*  PLT 172  CREATININE 3.73*   Estimated Creatinine Clearance: 9.1 mL/min (by C-G formula based on Cr of 3.73). No results for input(s): VANCOTROUGH, VANCOPEAK, VANCORANDOM, GENTTROUGH, GENTPEAK, GENTRANDOM, TOBRATROUGH, TOBRAPEAK, TOBRARND, AMIKACINPEAK, AMIKACINTROU, AMIKACIN in the last 72 hours.   Microbiology: No results found for this or any previous visit (from the past 720 hour(s)).  Anti-infectives    Start     Dose/Rate Route Frequency Ordered Stop   11/27/14 1200  vancomycin (VANCOCIN) 500 mg in sodium chloride 0.9 % 100 mL IVPB     500 mg100 mL/hr over 60 Minutes Intravenous Every T-Th-Sa (Hemodialysis) 11/25/14 1504     11/26/14 2000  ceFEPIme (MAXIPIME) 2 g in dextrose 5 % 50 mL IVPB  Status:  Discontinued     2 g100 mL/hr over 30 Minutes Intravenous Every M-W-F (2000) 11/24/14 1514 11/25/14 1504   11/25/14 1800  ceFEPIme (MAXIPIME) 2 g in dextrose 5 % 50 mL IVPB     2 g100 mL/hr over 30 Minutes Intravenous Every T-Th-Sa (1800) 11/25/14 1504     11/23/14  1200  ceFEPIme (MAXIPIME) 2 g in dextrose 5 % 50 mL IVPB  Status:  Discontinued     2 g100 mL/hr over 30 Minutes Intravenous Every M-W-F (Hemodialysis) 11/22/14 1555 11/24/14 1514   11/23/14 1200  vancomycin (VANCOCIN) 500 mg in sodium chloride 0.9 % 100 mL IVPB  Status:  Discontinued     500 mg100 mL/hr over 60 Minutes Intravenous Every M-W-F (Hemodialysis) 11/22/14 1555 11/25/14 1504   11/22/14 1221  vancomycin (VANCOCIN) 125 MG capsule 500 mg  Status:  Discontinued     500 mg Oral See admin instructions 11/22/14 1221 11/22/14 1437   11/22/14 1221  ceFEPIme (MAXIPIME) 2 g in dextrose 5 % 50 mL IVPB  Status:  Discontinued     2 g100 mL/hr over 30 Minutes Intravenous See admin instructions 11/22/14 1221 11/22/14 1437      Assessment: 79yof continues on Vancomycin and Cefepime for RLE cellulitis/ulcers. Patient remains afebrile, WBC wnl and no cultures. Noted plans for arteriogram - follow-up antibiotic plans s/p procedure. Patient has ESRD and receives HD normally MWF - HD plans and been adjusted to Sun/Tue/Thur for Holiday schedule --> will adjust antibiotic regimens. - Wt 46kg  Goal of Therapy:  Pre-HD Vancomycin level 15-25 mcg/ml  Plan:  1. Continue Vancomycin 500mg  IV qHD (changed to Sunday/Tuesday/Thursday due to holiday schedule) 2. Continue Cefepime 2g IV qHD (changed to TTS with first dose today due to holiday schedule) 3. F/U renal plans, cultures, clinical  course and adjust as indicated 4. F/U arteriogram and antibiotic plans  Cleon DewDulaney, Ackley Robert  [phone] 11/25/2014,3:11 PM

## 2014-11-26 ENCOUNTER — Other Ambulatory Visit: Payer: Self-pay

## 2014-11-26 ENCOUNTER — Encounter: Payer: Self-pay | Admitting: Vascular Surgery

## 2014-11-26 LAB — GLUCOSE, CAPILLARY
GLUCOSE-CAPILLARY: 79 mg/dL (ref 70–99)
GLUCOSE-CAPILLARY: 280 mg/dL — AB (ref 70–99)
Glucose-Capillary: 314 mg/dL — ABNORMAL HIGH (ref 70–99)
Glucose-Capillary: 225 mg/dL — ABNORMAL HIGH (ref 70–99)

## 2014-11-26 NOTE — Progress Notes (Signed)
[facility] PROGRESS NOTE  Nicole [name] ZOX:[id number] DOB: [birth year] DOA: 11/22/2014 PCP: Loreen FreudYvonne Lowne, DO  Assessment/Plan:    Chronic ulcers / R heel and R calf: ABI pending. Clear for arteriogram- not sure if this has been arranged for next week or not:   Continue antibiotics; called vascular office- asked for PA to see and determine if patient can be d/c'd and come back for this procedure or if patient needs to remain inpatient- can get abx with dialysis    DM (diabetes mellitus), type 2 with renal complications: no further hypoglycemia. Resume lantus   Chronic diastolic heart failure compensated CAD, recent stents   PAD (peripheral artery disease)   Hypothyroidism   End stage renal disease- HD M/W/F   Protein-calorie malnutrition, severe   Hypoglycemia NO navicular fx per ortho  Code Status:  DNR Family Communication:   Disposition Plan:  Back to snf when stable- have to clarify when vascular study to be done  Consultants:  VVS  ortho  Procedures:     Antibiotics:  Cefepime, vanc  HPI/Subjective: Eating well this AM  Objective: Filed Vitals:   11/26/14 0500  BP: 141/44  Pulse: 55  Temp: 98.2 F (36.8 C)  Resp: 18    Intake/Output Summary (Last 24 hours) at 11/26/14 0959 Last data filed at 11/25/14 1700  Gross per 24 hour  Intake    360 ml  Output   2016 ml  Net  -1656 ml   Filed Weights   11/25/14 0734 11/25/14 1131 11/25/14 2100  Weight: 48.2 kg (106 lb 4.2 oz) 45.6 kg (100 lb 8.5 oz) 47.945 kg (105 lb 11.2 oz)    Exam:   General:  Pleasant/cooperative  Cardiovascular: RRR without MGR  Respiratory: CTA without WWR  Abdomen: S, nt, nd  Ext: foot in prevlon boot  Basic Metabolic Panel:  Recent Labs Lab 11/22/14 0800 11/25/14 0739  NA 138 132*  K 3.9 5.1  CL 98 94*  CO2 26 24  GLUCOSE 83 277*  BUN 17 41*  CREATININE 2.55* 3.73*  CALCIUM 8.9 8.5  PHOS  --  3.2   Liver Function Tests:  Recent Labs Lab  11/22/14 0800 11/25/14 0739  AST 14  --   ALT 11  --   ALKPHOS 233*  --   BILITOT 0.4  --   PROT 6.4  --   ALBUMIN 2.5* 2.2*   No results for input(s): LIPASE, AMYLASE in the last 168 hours. No results for input(s): AMMONIA in the last 168 hours. CBC:  Recent Labs Lab 11/22/14 0800 11/25/14 0739  WBC 9.3 8.8  HGB 11.3* 9.5*  HCT 35.8* 30.9*  MCV 101.7* 104.4*  PLT 165 172   Cardiac Enzymes:  Recent Labs Lab 11/22/14 0800  TROPONINI <0.30   BNP (last 3 results)  Recent Labs  08/20/14 0323  PROBNP 9463.0*   CBG:  Recent Labs Lab 11/25/14 1206 11/25/14 1637 11/25/14 2117 11/25/14 2357 11/26/14 0739  GLUCAP 154* 240* 249* 329* 79    No results found for this or any previous visit (from the past 240 hour(s)).   Studies: No results found.  Scheduled Meds: . amLODipine  10 mg Oral Daily  . aspirin EC  81 mg Oral Daily  . atorvastatin  20 mg Oral q1800  . carvedilol  12.5 mg Oral BID WC  . ceFEPIme (MAXIPIME) 2 GM IVP  2 g Intravenous Q T,Th,Sat-1800  . cloNIDine  0.1 mg Oral Q2200  . collagenase  Topical Daily  . [START ON 11/28/2014] darbepoetin (ARANESP) injection - DIALYSIS  80 mcg Intravenous Q Wed-HD  . docusate sodium  100 mg Oral BID  . famotidine  20 mg Oral Daily  . feeding supplement (PRO-STAT SUGAR FREE 64)  30 mL Oral BID BM  . feeding supplement (RESOURCE BREEZE)  1 Container Oral BID BM  . gabapentin  100 mg Oral QHS  . heparin  5,000 Units Subcutaneous 3 times per day  . hydrALAZINE  50 mg Oral 4 times per day  . insulin aspart  0-5 Units Subcutaneous QHS  . insulin aspart  0-9 Units Subcutaneous TID WC  . insulin glargine  8 Units Subcutaneous QHS  . isosorbide mononitrate  20 mg Oral BID  . levothyroxine  88 mcg Oral QAC breakfast  . multivitamin  1 tablet Oral QHS  . senna-docusate  2 tablet Oral BID  . sevelamer carbonate  1,600 mg Oral TID WC  . ticagrelor  90 mg Oral BID  . [START ON 11/27/2014] vancomycin (VANCOCIN) 500  mg IVPB  500 mg Intravenous Q T,Th,Sa-HD  . vitamin C  500 mg Oral Daily   Continuous Infusions:   Time spent: 25 minutes  Toua Stites  [facility] [email or web address], password [email or web address] 11/26/2014, 9:59 AM  LOS: 4 days

## 2014-11-26 NOTE — Progress Notes (Signed)
[facility] Progress Note  Assessment/Plan: 1. AMS - sec to hypoglycemia- resolved- MS baseline. 2. Deep pressure ulcers - L heel/lateral calf w/ multilevel arterial occlusive disease, on Cefepime & Vancomycin; trying to clarify if angiogram this week. ABI left 0.29, right 0.44 3. ESRD - HD on MWF @ AF, K 5.1last HD  Sunday due to holiday schedule ; She had a f'gram 12/1 with 50% inflow stenosis treated to 10% - AF actually decreased from 377 (November) to 284 12/7 post intervention and she was scheduled for another f'gram with [facility]12/17 but was admitted to the hospital; AF HAD been running 428 (Oct) and 600 - 700 previously; kinetics ok in spite of this; PLAN next HD on Wednesday if she is going to be discharged today or tomorrow 4. HTN/Volume - BP 140 - 170s on Carvedilol 12.5 mg bid, Amlodipine 10 mg qd, Hydralazine 50 mg q6h; net UF 1.8 Friday and 2 L on Sunday with a post HD weight of 45.6 - would make new edw 45.5 5. Anemia - Hgb 11.3 down to 9.5 , Aranesp 80 mcg on Wed. 6. Sec HPT - P 3.2; no Hectorol, Renvela 2 with meals. 7. Protein calorie malnutrition - Alb 2.5, renal carb-mod diet, vitamin, supplement ordered- intake very poor - liberalize diet to regular 8. DM - per primary- stressed it is important for her to eat; we can treat high BS 9. DNR - requested by family.  [name], PA-C [facility] Beeper [phone] 11/26/2014,9:16 AM  LOS: 4 days   Subjective:     Objective Filed Vitals:   11/25/14 1208 11/25/14 1738 11/25/14 2100 11/26/14 0500  BP: 172/45 147/31 157/43 141/44  Pulse: 63 68 66 55  Temp: 98.1 F (36.7 C) 98.7 F (37.1 C) 98.2 F (36.8 C) 98.2 F (36.8 C)  TempSrc: Oral Oral Oral Oral  Resp: 18 17 18 18   Height:      Weight:   47.945 kg (105 lb 11.2 oz)   SpO2: 99% 100% 100% 100%   Physical Exam General: Heart: Lungs: Abdomen: Extremities: Dialysis Access: left upper AVF  Dialysis Orders: MWF [facility] 3h 45min 350/A1.5 2/2.25 Bath 46kg LUA AVF Heparin 2000 Aranesp 80 / wk, no other meds Cefipime/ Vanc started 12/16, ordered by SNF MD for wounds  Additional Objective Labs: Basic Metabolic Panel:  Recent Labs Lab 11/22/14 0800 11/25/14 0739  NA 138 132*  K 3.9 5.1  CL 98 94*  CO2 26 24  GLUCOSE 83 277*  BUN 17 41*  CREATININE 2.55* 3.73*  CALCIUM 8.9 8.5  PHOS  --  3.2   Liver Function Tests:  Recent Labs Lab 11/22/14 0800 11/25/14 0739  AST 14  --   ALT 11  --   ALKPHOS 233*  --   BILITOT 0.4  --   PROT 6.4  --   ALBUMIN 2.5* 2.2*  CBC:  Recent Labs Lab 11/22/14 0800 11/25/14 0739  WBC 9.3 8.8  HGB 11.3* 9.5*  HCT 35.8* 30.9*  MCV 101.7* 104.4*  PLT 165 172    Cardiac Enzymes:  Recent Labs Lab 11/22/14 0800  TROPONINI <0.30   CBG:  Recent Labs Lab 11/25/14 1206 11/25/14 1637 11/25/14 2117 11/25/14 2357 11/26/14 0739  GLUCAP 154* 240* 249* 329* 79  Medications:   . amLODipine  10 mg Oral Daily  . aspirin EC  81 mg Oral Daily  . atorvastatin  20 mg Oral q1800  . carvedilol  12.5 mg Oral  BID WC  . ceFEPIme (MAXIPIME) 2 GM IVP  2 g Intravenous Q T,Th,Sat-1800  . cloNIDine  0.1 mg Oral Q2200  . collagenase   Topical Daily  . [START ON 11/28/2014] darbepoetin (ARANESP) injection - DIALYSIS  80 mcg Intravenous Q Wed-HD  . docusate sodium  100 mg Oral BID  . famotidine  20 mg Oral Daily  . feeding supplement (PRO-STAT SUGAR FREE 64)  30 mL Oral BID BM  . feeding supplement (RESOURCE BREEZE)  1 Container Oral BID BM  . gabapentin  100 mg Oral QHS  . heparin  5,000 Units Subcutaneous 3 times per day  . hydrALAZINE  50 mg Oral 4 times per day  . insulin aspart  0-5 Units Subcutaneous QHS  . insulin aspart  0-9 Units Subcutaneous TID WC  . insulin glargine  8 Units Subcutaneous QHS  . isosorbide mononitrate  20 mg Oral BID  . levothyroxine  88 mcg Oral QAC breakfast  . multivitamin  1 tablet Oral QHS  . senna-docusate  2  tablet Oral BID  . sevelamer carbonate  1,600 mg Oral TID WC  . ticagrelor  90 mg Oral BID  . [START ON 11/27/2014] vancomycin (VANCOCIN) 500 mg IVPB  500 mg Intravenous Q T,Th,Sa-HD  . vitamin C  500 mg Oral Daily

## 2014-11-26 NOTE — Progress Notes (Signed)
Inpatient Diabetes Program Recommendations  AACE/ADA: New Consensus Statement on Inpatient Glycemic Control (2013)  Target Ranges:  Prepandial:   less than 140 mg/dL      Peak postprandial:   less than 180 mg/dL (1-2 hours)      Critically ill patients:  140 - 180 mg/dL   Reason for Assessment: Results for Sharlet SalinaDWARDS, Shakedra B (MRN [id number]) as of 11/26/2014 12:27  Ref. Range 11/25/2014 16:37 11/25/2014 21:17 11/25/2014 23:57 11/26/2014 07:39 11/26/2014 11:47  Glucose-Capillary Latest Range: 70-99 mg/dL 119240 (H) 147249 (H) 829329 (H) 79 314 (H)   Diabetes history:  Type 2 diabetes with renal complications Outpatient Diabetes medications: Lantus 12 units q HS, Novolog 5 units bid with meals Current orders for Inpatient glycemic control:  Lantus 8 units q HS, Novolog sensitive tid with meals  Please consider adding Novolog meal coverage 3 units tid with meals (Hold if patient eats less than 50%).  Thanks, Beryl MeagerJenny Shaylyn Bawa, RN, BC-ADM Inpatient Diabetes Coordinator Pager [phone]

## 2014-11-27 ENCOUNTER — Encounter: Payer: Medicare Other | Admitting: Vascular Surgery

## 2014-11-27 ENCOUNTER — Encounter (HOSPITAL_COMMUNITY): Payer: Medicare Other

## 2014-11-27 ENCOUNTER — Other Ambulatory Visit (HOSPITAL_COMMUNITY): Payer: Medicare Other

## 2014-11-27 ENCOUNTER — Other Ambulatory Visit: Payer: Self-pay | Admitting: *Deleted

## 2014-11-27 ENCOUNTER — Other Ambulatory Visit: Payer: Self-pay

## 2014-11-27 DIAGNOSIS — I83029 Varicose veins of left lower extremity with ulcer of unspecified site: Secondary | ICD-10-CM

## 2014-11-27 DIAGNOSIS — L97929 Non-pressure chronic ulcer of unspecified part of left lower leg with unspecified severity: Principal | ICD-10-CM

## 2014-11-27 DIAGNOSIS — I70209 Unspecified atherosclerosis of native arteries of extremities, unspecified extremity: Secondary | ICD-10-CM

## 2014-11-27 DIAGNOSIS — L98499 Non-pressure chronic ulcer of skin of other sites with unspecified severity: Secondary | ICD-10-CM

## 2014-11-27 LAB — VANCOMYCIN, RANDOM: VANCOMYCIN RM: 6.4 ug/mL

## 2014-11-27 LAB — GLUCOSE, CAPILLARY
Glucose-Capillary: 87 mg/dL (ref 70–99)
Glucose-Capillary: 261 mg/dL — ABNORMAL HIGH (ref 70–99)
Glucose-Capillary: 362 mg/dL — ABNORMAL HIGH (ref 70–99)

## 2014-11-27 MED ORDER — PRO-STAT SUGAR FREE PO LIQD: 30.0000 mL | Freq: Two times a day (BID) | ORAL | Status: AC

## 2014-11-27 MED ORDER — DEXTROSE 5 % IV SOLN: 2.0000 g | INTRAVENOUS | Status: AC

## 2014-11-27 MED ORDER — POLYETHYLENE GLYCOL 3350 17 G PO PACK: 17.0000 g | PACK | Freq: Every day | ORAL | Status: AC | PRN

## 2014-11-27 MED ORDER — VANCOMYCIN HCL IN DEXTROSE 750-5 MG/150ML-% IV SOLN
750.0000 mg | Freq: Once | INTRAVENOUS | Status: AC
  Administered 2014-11-27: 750 mg via INTRAVENOUS
  Filled 2014-11-27: qty 150

## 2014-11-27 MED ORDER — BOOST / RESOURCE BREEZE PO LIQD: 1.0000 | Freq: Two times a day (BID) | ORAL | Status: AC

## 2014-11-27 MED ORDER — HYDROCODONE-ACETAMINOPHEN 5-325 MG PO TABS: 1.0000 | ORAL_TABLET | Freq: Four times a day (QID) | ORAL | Status: AC | PRN

## 2014-11-27 MED ORDER — VANCOMYCIN HCL 500 MG IV SOLR: 500.0000 mg | INTRAVENOUS | Status: AC

## 2014-11-27 MED ORDER — COLLAGENASE 250 UNIT/GM EX OINT: TOPICAL_OINTMENT | Freq: Every day | CUTANEOUS | Status: AC

## 2014-11-27 MED ORDER — INSULIN GLARGINE 100 UNIT/ML ~~LOC~~ SOLN: 8.0000 [IU] | Freq: Every day | SUBCUTANEOUS | Status: AC

## 2014-11-27 NOTE — Progress Notes (Signed)
  [facility] Progress Note  11/27/2014 8:55 AM  Patient will be scheduled for outpatient arteriogram with Dr. Edilia Boickson on Monday 12/03/14. Patient is ok for d/c to SNF. Pre-procedural info to be faxed to hospital today. Spoke to [employer] Ha[employer] Cassie about this.    Nicole BergerKimberly Jahdai Padovano, PA-C [facility] Office: [phone] Pager: [phone] 11/27/2014 8:55 AM

## 2014-11-27 NOTE — Care Management Note (Signed)
CARE MANAGEMENT NOTE 11/27/2014  Patient:  Nicole [name], Nicole [name]   Account Number:  [id number]  Date Initiated:  11/27/2014  Documentation initiated by:  Lantz Hermann  Subjective/Objective Assessment:   CM following for progression and d/c planning.     Action/Plan:   11/27/2014 Met with pt , plan to return to SNF, IM given and explained.   Anticipated DC Date:  11/27/2014   Anticipated DC Plan:  SKILLED NURSING FACILITY         Choice offered to / List presented to:             Status of service:   Medicare Important Message given?  YES (If response is "NO", the following Medicare IM given date fields will be blank) Date Medicare IM given:  11/27/2014 Medicare IM given by:  Sharrieff Spratlin Date Additional Medicare IM given:   Additional Medicare IM given by:    Discharge Disposition:  [place]  Per UR Regulation:    If discussed at Long Length of Stay Meetings, dates discussed:    Comments:

## 2014-11-27 NOTE — Progress Notes (Signed)
ANTIBIOTIC CONSULT NOTE - FOLLOW UP  Pharmacy Consult for Vancomycin and Cefepime Indication: severe cellulitis, ulcers  Allergies  Allergen Reactions  . Ibuprofen     REACTION: edema---thyroid problems  . Prednisone     Causes severe fluid retention  . Procrit [Epoetin Alfa]     Per Dr. Eliane DecreePatel's note: she apparently has had an "adverse reaction" to Procrit after which she gets diffuse arthralgias and myalgias.    Patient Measurements: Height: 5' 4.17" (163 cm) Weight: 105 lb 11.2 oz (47.945 kg) IBW/kg (Calculated) : 55.1  Vital Signs: Temp: 98.7 F (37.1 C) (12/22 0514) Temp Source: Oral (12/22 0514) BP: 139/58 mmHg (12/22 0514) Pulse Rate: 63 (12/22 0514) Intake/Output from previous day: 12/21 0701 - 12/22 0700 In: 240 [P.O.:240] Out: -  Intake/Output from this shift:    Labs:  Recent Labs  11/25/14 0739  WBC 8.8  HGB 9.5*  PLT 172  CREATININE 3.73*   Estimated Creatinine Clearance: 9.6 mL/min (by C-G formula based on Cr of 3.73).  Recent Labs  11/27/14 0500  VANCORANDOM 6.4     Microbiology: No results found for this or any previous visit (from the past 720 hour(s)).  Anti-infectives    Start     Dose/Rate Route Frequency Ordered Stop   11/27/14 1200  vancomycin (VANCOCIN) 500 mg in sodium chloride 0.9 % 100 mL IVPB     500 mg100 mL/hr over 60 Minutes Intravenous Every T-Th-Sa (Hemodialysis) 11/25/14 1504     11/27/14 0900  vancomycin (VANCOCIN) IVPB 750 mg/150 ml premix     750 mg150 mL/hr over 60 Minutes Intravenous  Once 11/27/14 0823     11/26/14 2000  ceFEPIme (MAXIPIME) 2 g in dextrose 5 % 50 mL IVPB  Status:  Discontinued     2 g100 mL/hr over 30 Minutes Intravenous Every M-W-F (2000) 11/24/14 1514 11/25/14 1504   11/25/14 1800  ceFEPIme (MAXIPIME) 2 g in dextrose 5 % 50 mL IVPB     2 g100 mL/hr over 30 Minutes Intravenous Every T-Th-Sa (1800) 11/25/14 1504     11/23/14 1200  ceFEPIme (MAXIPIME) 2 g in dextrose 5 % 50 mL IVPB  Status:   Discontinued     2 g100 mL/hr over 30 Minutes Intravenous Every M-W-F (Hemodialysis) 11/22/14 1555 11/24/14 1514   11/23/14 1200  vancomycin (VANCOCIN) 500 mg in sodium chloride 0.9 % 100 mL IVPB  Status:  Discontinued     500 mg100 mL/hr over 60 Minutes Intravenous Every M-W-F (Hemodialysis) 11/22/14 1555 11/25/14 1504   11/22/14 1221  vancomycin (VANCOCIN) 125 MG capsule 500 mg  Status:  Discontinued     500 mg Oral See admin instructions 11/22/14 1221 11/22/14 1437   11/22/14 1221  ceFEPIme (MAXIPIME) 2 g in dextrose 5 % 50 mL IVPB  Status:  Discontinued     2 g100 mL/hr over 30 Minutes Intravenous See admin instructions 11/22/14 1221 11/22/14 1437      Assessment: 79yof continues on Vancomycin and Cefepime for RLE cellulitis/ulcers. Patient remains afebrile, WBC wnl and no cultures. Noted plans for arteriogram - follow-up antibiotic plans s/p procedure. Patient has ESRD and normally receives HD MWF - HD plans had been adjusted to Sun/Tue/Thur for Holiday schedule, but next HD session likely tomorrow (Wed) if patient discharged today.  Pt cleared for arteriogram, likely next week - Wt 46kg  12/22: Vanc random came back subtherapeutic at 6.4, which is well below her target level  Goal of Therapy:  Pre-HD Vancomycin level 15-25 mcg/ml  Plan:  1. Vanc 750mg  IV x 1 due to low vanc random 2. Increase maintenance dose to Vanc 750mg  IV qHD 3. Continue Cefepime 2g IV qHD 4. F/U renal plans, cultures, clinical course and adjust as indicated 5. F/U arteriogram and antibiotic plans 6. F/U HD schedule d/t holiday schedule  Waynette Butteryegan K. Dawnna Gritz, PharmD Clinical Pharmacy Resident Pager: [phone] 11/27/2014 8:38 AM

## 2014-11-27 NOTE — Progress Notes (Signed)
Patient Discharge:  Disposition: Discharged to [facility]  Education: Report called to [facility] charge nurse.  IV: Removed  Follow-up appointments: Reviewed with pt  Transportation: Via Ambulance  Belongings:All belongings taken with pt.

## 2014-11-27 NOTE — Clinical Social Work Psychosocial (Addendum)
Clinical Social Work Department BRIEF PSYCHOSOCIAL ASSESSMENT 11/27/2014  Patient:  Nicole [name],Nicole [name]     Account Number:  [id number]     Admit date:  11/22/2014  Clinical Social Worker:  Delmer IslamRAWFORD,Kisa Fujii, LCSW  Date/Time:  11/27/2014 02:45 AM  Referred by:  Physician  Date Referred:  11/26/2014 Referred for  SNF Placement   Other Referral:   Interview type:  Patient Other interview type:    PSYCHOSOCIAL DATA Living Status:  FACILITY Admitted from facility:  [facility] Level of care:  Skilled Nursing Facility Primary support name:  Tomasa HoseKen Snarski Primary support relationship to patient:  CHILD, ADULT Degree of support available:   Good support    CURRENT CONCERNS Current Concerns  Post-Acute Placement   Other Concerns:    SOCIAL WORK ASSESSMENT / PLAN CSW talked with patient at the bedside and informed her that she is medically stable for discharge today. Ms. Nicole Evensdwards advised CSW of her intent to return to [facility]. She requested that CSW contact her son Tomasa HoseKen Kinn to inform him of discharge. Call made to Mr. Deupree and he was informed of patient's discharge today and transport by ambulance back to facility.   Assessment/plan status:  No Further Intervention Required Other assessment/ plan:   Information/referral to community resources:   None needed or requested at this time.    PATIENT'S/FAMILY'S RESPONSE TO PLAN OF CARE: Patient informed of today's discharge and intends to return to [facility] skilled facility to continue her rehab.   Patient's son Tomasa HoseKen Haithcock contacted at 5:39 pm and informed that ambulance transport had been called for patient.     Nicole BalVanessa Roldan [name], MSW, LCSW Licensed Clinical Social Worker Clinical Social Work Department [facility] [phone]

## 2014-11-27 NOTE — Discharge Summary (Signed)
Physician Discharge Summary  Nicole SalinaJeanette B Sanson JYN:[id number] DOB: 1934-12-18 DOA: 11/22/2014  PCP: Loreen FreudYvonne Lowne, DO  Admit date: 11/22/2014 Discharge date: 11/27/2014  Time spent: 35 minutes  Recommendations for Outpatient Follow-up:  1. Continue IV abx at dialysis until seen by vascular for angiogram on 12/28 2. Next HD wed this week  Discharge Diagnoses:  Principal Problem:   Chronic ulcers / R heel and R calf Active Problems:   DM (diabetes mellitus), type 2 with renal complications   Chronic diastolic heart failure   PAD (peripheral artery disease)   Hypothyroidism   End stage renal disease   Protein-calorie malnutrition, severe   Hypoglycemia   Discharge Condition: improved  Diet recommendation: renal/carb mod  Filed Weights   11/25/14 1131 11/25/14 2100 11/26/14 2057  Weight: 45.6 kg (100 lb 8.5 oz) 47.945 kg (105 lb 11.2 oz) 47.945 kg (105 lb 11.2 oz)    History of present illness:  Nicole [name] is a 79 y.o. female with end-stage renal disease on hemodialysis, diabetes mellitus insulin requiring, chronic diastolic heart failure, acute MI in September 2015 status post stent to the left circumflex, lower extremity wound, hypothyroidism, peripheral artery disease and hyperlipidemia. [facility] after being found unresponsive on the floor with snoring respirations and a CBG of 33. She is now awake and alert. She tells me that she has been eating poorly and did not eat much dinner last night. She recalls waking up this morning and feeling funny prior to being found unresponsive. Other than today she has not been having frequent hypoglycemia but sugars have been much better controlled than they had been in the past. The patient also has an ulcer on her left heel for which she was started back on IV antibiotics yesterday. She was due for a fistulogram today as outpatient.  Hospital Course:  Chronic ulcers / R heel and R calf: ABI  done: R 0.44; L 0.29 Continue antibiotics Angiogram scheduled for 12/28   DM (diabetes mellitus), type 2 with renal complications: no further hypoglycemia. Resume lantus at lower dose  Chronic diastolic heart failure compensated CAD, recent stents  PAD (peripheral artery disease)  Hypothyroidism  End stage renal disease- HD M/W/F  Protein-calorie malnutrition, severe  Hypoglycemia NO navicular fx per ortho  Procedures:  ABI  Consultations:  renal  Discharge Exam: Filed Vitals:   11/27/14 0514  BP: 139/58  Pulse: 63  Temp: 98.7 F (37.1 C)  Resp: 18    General: A+Ox3, NAD Cardiovascular: rrr Respiratory: clear  Discharge Instructions   Discharge Instructions    Discharge instructions    Complete by:  As directed   Angiogram planned for Monday 12/28 Renal/carb mod diet     Increase activity slowly    Complete by:  As directed           Current Discharge Medication List    START taking these medications   Details  Amino Acids-Protein Hydrolys (FEEDING SUPPLEMENT, PRO-STAT SUGAR FREE 64,) LIQD Take 30 mLs by mouth 2 (two) times daily between meals. Qty: 900 mL, Refills: 0    collagenase (SANTYL) ointment Apply topically daily. Apply Santyl to left heel Q day.  Cover with fluffed moist 2X2 and foam dressing.  (Change foam dressing Q 5 days or PRN soiling.) Qty: 15 g, Refills: 0    feeding supplement, RESOURCE BREEZE, (RESOURCE BREEZE) LIQD Take 1 Container by mouth 2 (two) times daily between meals. Refills: 0    polyethylene  glycol (MIRALAX / GLYCOLAX) packet Take 17 g by mouth daily as needed for mild constipation. Qty: 14 each, Refills: 0    vancomycin 500 mg in sodium chloride 0.9 % 100 mL Inject 500 mg into the vein Every Tuesday,Thursday,and Saturday with dialysis.      CONTINUE these medications which have CHANGED   Details  ceFEPIme 2 g in dextrose 5 % 50 mL Inject 2 g into the vein every Tuesday, Thursday, and Saturday at 6 PM.     HYDROcodone-acetaminophen (NORCO/VICODIN) 5-325 MG per tablet Take 1-2 tablets by mouth every 6 (six) hours as needed for moderate pain. Qty: 20 tablet, Refills: 0    insulin glargine (LANTUS) 100 UNIT/ML injection Inject 0.08 mLs (8 Units total) into the skin at bedtime. Qty: 10 mL, Refills: 11      CONTINUE these medications which have NOT CHANGED   Details  amLODipine (NORVASC) 10 MG tablet Take 1 tablet (10 mg total) by mouth daily. Qty: 30 tablet, Refills: 0    aspirin EC 81 MG tablet Take 81 mg by mouth daily.    atorvastatin (LIPITOR) 20 MG tablet Take 1 tablet (20 mg total) by mouth daily at 6 PM. Qty: 30 tablet, Refills: 0    carvedilol (COREG) 12.5 MG tablet Take 1 tablet (12.5 mg total) by mouth 2 (two) times daily with a meal. Qty: 180 tablet, Refills: 0    Cholecalciferol (VITAMIN D) 1000 UNITS capsule Take 1,000 Units by mouth daily.     cloNIDine (CATAPRES) 0.1 MG tablet Take 0.1 mg by mouth daily at 10 pm.     docusate sodium 100 MG CAPS Take 100 mg by mouth 2 (two) times daily.    famotidine (PEPCID) 20 MG tablet Take 20 mg by mouth daily.    gabapentin (NEURONTIN) 100 MG capsule Take 100 mg by mouth at bedtime.    HEPARIN SODIUM, BOVINE, IJ Inject 5,000 Units as directed 3 (three) times daily.    hydrALAZINE (APRESOLINE) 50 MG tablet Take 50 mg by mouth every 6 (six) hours.    isosorbide mononitrate (ISMO,MONOKET) 20 MG tablet Take 1 tablet (20 mg total) by mouth 2 (two) times daily. Qty: 180 tablet, Refills: 0    levothyroxine (SYNTHROID, LEVOTHROID) 88 MCG tablet Take 88 mcg by mouth daily before breakfast.    Probiotic Product (PROBIOTIC DAILY PO) Take 1 capsule by mouth 2 (two) times daily.    senna-docusate (SENOKOT-S) 8.6-50 MG per tablet Take 2 tablets by mouth 2 (two) times daily.    sevelamer (RENAGEL) 800 MG tablet Take 1,600 mg by mouth 3 (three) times daily with meals.    ticagrelor (BRILINTA) 90 MG TABS tablet Take 1 tablet (90 mg total)  by mouth 2 (two) times daily. Qty: 60 tablet, Refills: 0    vitamin C (ASCORBIC ACID) 500 MG tablet Take 500 mg by mouth daily.    acetaminophen (TYLENOL) 650 MG CR tablet Take 650 mg by mouth every 8 (eight) hours as needed for pain.    !! insulin aspart (NOVOLOG) 100 UNIT/ML injection Inject 0-9 Units into the skin 3 (three) times daily with meals. CBG < 70: implement hypoglycemia protocol CBG 70 - 120: 0 units CBG 121 - 150: 1 unit CBG 151 - 200: 2 units CBG 201 - 250: 3 units CBG 251 - 300: 5 units CBG 301 - 350: 7 units CBG 351 - 400: 9 units CBG > 400: call MD.    !! insulin aspart (NOVOLOG) 100 UNIT/ML injection Inject  5 Units into the skin 2 (two) times daily. Before breakfast and before lunch    ondansetron (ZOFRAN) 4 MG tablet Take 4 mg by mouth every 6 (six) hours as needed for nausea or vomiting.     !! - Potential duplicate medications found. Please discuss with provider.    STOP taking these medications     clindamycin (CLEOCIN) 300 MG capsule      vancomycin 500 mg in sodium chloride 0.9 % 100 mL        Allergies  Allergen Reactions  . Ibuprofen     REACTION: edema---thyroid problems  . Prednisone     Causes severe fluid retention  . Procrit [Epoetin Alfa]     Per Dr. Eliane Decree note: she apparently has had an "adverse reaction" to Procrit after which she gets diffuse arthralgias and myalgias.      The results of significant diagnostics from this hospitalization (including imaging, microbiology, ancillary and laboratory) are listed below for reference.    Significant Diagnostic Studies: Dg Chest 1 View  11/22/2014   CLINICAL DATA:  Altered mental status  EXAM: CHEST - 1 VIEW  COMPARISON:  August 20, 2014  FINDINGS: There is no edema or consolidation. Heart is mildly enlarged with pulmonary vascularity within normal limits. There is atherosclerotic change throughout the aorta. No adenopathy. There is degenerative change in the thoracic spine. Bones are  osteoporotic.  IMPRESSION: No edema or consolidation.  Heart prominent but stable.   Electronically Signed   By: Bretta Bang M.D.   On: 11/22/2014 07:51   Dg Pelvis 1-2 Views  11/22/2014   CLINICAL DATA:  79 year old female with left pelvic fractures.  EXAM: PELVIS - 1-2 VIEW  COMPARISON:  07/23/2014.  FINDINGS: Previously demonstrated fractures of the left superior and inferior pubic rami appear to be healing. There continues to be a displaced comminuted fracture of the left acetabulum, better demonstrated on prior CT scan 07/23/2014. Fracture lines appear slightly blurred, suggesting some healing, however, there are still multiple clearly define fracture lines evident. Left femoral head again projects within the fractured acetabulum, with some medial displacement, similar to the prior examination. No new acute displaced fracture identified. Three fixation screws again are noted in the left femoral neck, with old healed left femoral neck fracture.  IMPRESSION: 1. The appearance of the bony pelvis is very similar to prior study 07/23/2014, with a small amount of bony healing in the displaced comminuted left acetabular fracture, as well as the left inferior and superior pubic rami fractures, as above.   Electronically Signed   By: Trudie Reed M.D.   On: 11/22/2014 11:36   Ct Head Wo Contrast  11/22/2014   CLINICAL DATA:  Found on floor.  Decreased responsiveness.  ESRD.  EXAM: CT HEAD WITHOUT CONTRAST  CT CERVICAL SPINE WITHOUT CONTRAST  TECHNIQUE: Multidetector CT imaging of the head and cervical spine was performed following the standard protocol without intravenous contrast. Multiplanar CT image reconstructions of the cervical spine were also generated.  COMPARISON:  07/22/2014  FINDINGS: CT HEAD FINDINGS  Ventricle size is within normal limits.  Mild atrophy is stable.  Negative for acute infarct.  Negative for hemorrhage or mass.  Negative for skull fracture. Hyperdense vitreous in the right  globe consistent with prior surgical procedure, unchanged from the prior study.  CT CERVICAL SPINE FINDINGS  Cervical kyphosis. Advanced multilevel disc degeneration and spondylosis. Mild facet degeneration diffusely.  Negative for fracture or mass lesion.  IMPRESSION: No acute intracranial abnormality.  Advanced cervical degenerative change.  Negative for fracture   Electronically Signed   By: Marlan Palauharles  Clark M.D.   On: 11/22/2014 07:52   Ct Cervical Spine Wo Contrast  11/22/2014   CLINICAL DATA:  Found on floor.  Decreased responsiveness.  ESRD.  EXAM: CT HEAD WITHOUT CONTRAST  CT CERVICAL SPINE WITHOUT CONTRAST  TECHNIQUE: Multidetector CT imaging of the head and cervical spine was performed following the standard protocol without intravenous contrast. Multiplanar CT image reconstructions of the cervical spine were also generated.  COMPARISON:  07/22/2014  FINDINGS: CT HEAD FINDINGS  Ventricle size is within normal limits.  Mild atrophy is stable.  Negative for acute infarct.  Negative for hemorrhage or mass.  Negative for skull fracture. Hyperdense vitreous in the right globe consistent with prior surgical procedure, unchanged from the prior study.  CT CERVICAL SPINE FINDINGS  Cervical kyphosis. Advanced multilevel disc degeneration and spondylosis. Mild facet degeneration diffusely.  Negative for fracture or mass lesion.  IMPRESSION: No acute intracranial abnormality.  Advanced cervical degenerative change.  Negative for fracture   Electronically Signed   By: Marlan Palauharles  Clark M.D.   On: 11/22/2014 07:52   Dg Foot Complete Left  11/22/2014   CLINICAL DATA:  Patient recently fell.  Currently with foot ulcer  EXAM: LEFT FOOT - COMPLETE 3+ VIEW  COMPARISON:  None.  FINDINGS: Frontal, oblique, and lateral views were obtained. Bones are diffusely osteoporotic. There is a tiny calcification just medial to the navicular on the oblique view which may represent a small avulsion type injury. No other evidence of  potential fracture. No dislocation. There is no erosive change or bony destruction. There is narrowing of all PIP and DIP joints.  There is pes cavus.  There are scattered foci of arterial vascular calcification. No soft tissue abscess is appreciable.  IMPRESSION: Question tiny avulsion along the medial navicular. No erosive change or bony destruction. Vascular calcification noted at several sites. Pes cavus present. Bones osteoporotic.   Electronically Signed   By: Bretta BangWilliam  Woodruff M.D.   On: 11/22/2014 11:27    Microbiology: No results found for this or any previous visit (from the past 240 hour(s)).   Labs: Basic Metabolic Panel:  Recent Labs Lab 11/22/14 0800 11/25/14 0739  NA 138 132*  K 3.9 5.1  CL 98 94*  CO2 26 24  GLUCOSE 83 277*  BUN 17 41*  CREATININE 2.55* 3.73*  CALCIUM 8.9 8.5  PHOS  --  3.2   Liver Function Tests:  Recent Labs Lab 11/22/14 0800 11/25/14 0739  AST 14  --   ALT 11  --   ALKPHOS 233*  --   BILITOT 0.4  --   PROT 6.4  --   ALBUMIN 2.5* 2.2*   No results for input(s): LIPASE, AMYLASE in the last 168 hours. No results for input(s): AMMONIA in the last 168 hours. CBC:  Recent Labs Lab 11/22/14 0800 11/25/14 0739  WBC 9.3 8.8  HGB 11.3* 9.5*  HCT 35.8* 30.9*  MCV 101.7* 104.4*  PLT 165 172   Cardiac Enzymes:  Recent Labs Lab 11/22/14 0800  TROPONINI <0.30   BNP: BNP (last 3 results)  Recent Labs  08/20/14 0323  PROBNP 9463.0*   CBG:  Recent Labs Lab 11/26/14 0739 11/26/14 1147 11/26/14 1648 11/26/14 2101 11/27/14 0833  GLUCAP 79 314* 280* 225* 87       Signed:  Rotha Cassels  [facility] 11/27/2014, 9:04 AM

## 2014-11-27 NOTE — Progress Notes (Signed)
[facility] Progress Note  Assessment/Plan: 1. AMS - sec to hypoglycemia- resolved- MS baseline. 2. Deep pressure ulcers - L heel/lateral calf w/ multilevel arterial occlusive disease, on Cefepime & Vancomycin;  ABI left 0.29, right 0.44; plan outpt arteriogram next Monday 12/28 -this is her HD day - so may need to postpone the later to Tuesday if not admitted 3. ESRD - HD on MWF @ AF, K 5.1last HD Sunday due to holiday schedule ; She had a f'gram 12/1 with 50% inflow stenosis treated to 10% - AF actually decreased from 377 (November) to 284 12/7 post intervention and she was scheduled for another f'gram with [facility]12/17 but was admitted to the hospital; AF HAD been running 428 (Oct) and 600 - 700 previously; kinetics ok in spite of this; PLAN next HD on Wednesday  4. HTN/Volume - BP 130 - 140s on Carvedilol 12.5 mg bid, Amlodipine 10 mg qd, Hydralazine 50 mg q6h; net UF 1.8 Friday and 2 L on Sunday with a post HD weight of 45.6 - would make new edw 45.5 5. Anemia - Hgb 11.3 down to 9.5 , Aranesp 80 mcg on Wed. 6. Sec HPT - P 3.2; no Hectorol, Renvela 2 with meals. 7. Protein calorie malnutrition - Alb 2.5, renal carb-mod diet, vitamin, supplement ordered- intake very poor - liberalize diet to regular 8. DM - per primary- stressed it is important for her to eat; we can treat high BS DNR - requested by family  Johnny BridgeMartha.Maureen ChattersB Reesha Debes, PA-C [facility] [phone] 11/27/2014,9:49 AM  LOS: 5 days   Subjective:   Going back to NH today  Objective Filed Vitals:   11/26/14 1000 11/26/14 1722 11/26/14 2057 11/27/14 0514  BP: 132/46 141/41 140/42 139/58  Pulse: 56 66 64 63  Temp: 98.4 F (36.9 C) 98.3 F (36.8 C) 98.7 F (37.1 C) 98.7 F (37.1 C)  TempSrc: Oral Oral Oral Oral  Resp: 16 18 18 18   Height:      Weight:   47.945 kg (105 lb 11.2 oz)   SpO2: 99% 96% 100% 100%   Physical Exam General: NAD Heart: RRR Lungs: no rales Abdomen: soft  NT Extremities: no edema left foot in boot Dialysis Access: left upper AVF + bruit  Dialysis Orders: MWF [facility] 3h 45min 350/A1.5 2/2.25 [place] 46kg LUA AVF Heparin 2000 Aranesp 80 / wk, no other meds Cefipime/ Vanc started 12/16, ordered by SNF MD for wounds  Additional Objective Labs: Basic Metabolic Panel:  Recent Labs Lab 11/22/14 0800 11/25/14 0739  NA 138 132*  K 3.9 5.1  CL 98 94*  CO2 26 24  GLUCOSE 83 277*  BUN 17 41*  CREATININE 2.55* 3.73*  CALCIUM 8.9 8.5  PHOS  --  3.2   Liver Function Tests:  Recent Labs Lab 11/22/14 0800 11/25/14 0739  AST 14  --   ALT 11  --   ALKPHOS 233*  --   BILITOT 0.4  --   PROT 6.4  --   ALBUMIN 2.5* 2.2*  CBC:  Recent Labs Lab 11/22/14 0800 11/25/14 0739  WBC 9.3 8.8  HGB 11.3* 9.5*  HCT 35.8* 30.9*  MCV 101.7* 104.4*  PLT 165 172    Cardiac Enzymes:  Recent Labs Lab 11/22/14 0800  TROPONINI <0.30   CBG:  Recent Labs Lab 11/26/14 0739 11/26/14 1147 11/26/14 1648 11/26/14 2101 11/27/14 0833  GLUCAP 79 314* 280* 225* 87   Iron Studies: No results for input(s): IRON, TIBC, TRANSFERRIN,  FERRITIN in the last 72 hours. @lablastinr3 @ Studies/Results: No results found. Medications:   . amLODipine  10 mg Oral Daily  . aspirin EC  81 mg Oral Daily  . atorvastatin  20 mg Oral q1800  . carvedilol  12.5 mg Oral BID WC  . ceFEPIme (MAXIPIME) 2 GM IVP  2 g Intravenous Q T,Th,Sat-1800  . cloNIDine  0.1 mg Oral Q2200  . collagenase   Topical Daily  . [START ON 11/28/2014] darbepoetin (ARANESP) injection - DIALYSIS  80 mcg Intravenous Q Wed-HD  . docusate sodium  100 mg Oral BID  . famotidine  20 mg Oral Daily  . feeding supplement (PRO-STAT SUGAR FREE 64)  30 mL Oral BID BM  . feeding supplement (RESOURCE BREEZE)  1 Container Oral BID BM  . gabapentin  100 mg Oral QHS  . heparin  5,000 Units Subcutaneous 3 times per day  . hydrALAZINE  50 mg Oral 4 times per day  . insulin aspart  0-5 Units  Subcutaneous QHS  . insulin aspart  0-9 Units Subcutaneous TID WC  . insulin glargine  8 Units Subcutaneous QHS  . isosorbide mononitrate  20 mg Oral BID  . levothyroxine  88 mcg Oral QAC breakfast  . multivitamin  1 tablet Oral QHS  . senna-docusate  2 tablet Oral BID  . sevelamer carbonate  1,600 mg Oral TID WC  . ticagrelor  90 mg Oral BID  . vancomycin (VANCOCIN) 500 mg IVPB  500 mg Intravenous Q T,Th,Sa-HD  . vancomycin  750 mg Intravenous Once  . vitamin C  500 mg Oral Daily

## 2014-11-29 ENCOUNTER — Encounter: Payer: Self-pay | Admitting: Internal Medicine

## 2014-11-29 ENCOUNTER — Non-Acute Institutional Stay (SKILLED_NURSING_FACILITY): Payer: Medicare Other | Admitting: Internal Medicine

## 2014-11-29 DIAGNOSIS — E038 Other specified hypothyroidism: Secondary | ICD-10-CM

## 2014-11-29 DIAGNOSIS — E1122 Type 2 diabetes mellitus with diabetic chronic kidney disease: Secondary | ICD-10-CM

## 2014-11-29 DIAGNOSIS — L97929 Non-pressure chronic ulcer of unspecified part of left lower leg with unspecified severity: Secondary | ICD-10-CM

## 2014-11-29 DIAGNOSIS — N189 Chronic kidney disease, unspecified: Secondary | ICD-10-CM

## 2014-11-29 DIAGNOSIS — I5032 Chronic diastolic (congestive) heart failure: Secondary | ICD-10-CM

## 2014-11-29 DIAGNOSIS — N186 End stage renal disease: Secondary | ICD-10-CM

## 2014-11-29 NOTE — Progress Notes (Signed)
Patient ID: Nicole [name], female   DOB: 1934-12-11, 79 y.o.   MRN: [id number]    This is an acute visit.  Level care skilled.  Facility [facility].  Chief complaint-acute visit status post hospitalization for unresponsiveness-hypoglycemia-chronic left heel and calf ulcers.  History of present illness.  Patient is a 79 year old female with end-stage renal disease on hemodialysis-with diabetes insulin requiring-chronic diastolic CHF with a history of acute MI in September 2015 status post stent to left circumflex-also with lower extremity wounds.  She was found unresponsive at the facility on the floor-with a CBG of 33-she was awake and alert on arrival to the ER-.  She also has an ulcer on her heel and was started on IV antibiotics previously at the facility.  .  In regards to chronic ulcers left heel right calf her antibiotic was continued-with gram scheduled for December 28.  Her diabetes apparently stabilized in the hospital with no further hypoglycemia-her Lantus was started at a lower dose.  Her diastolic CHF apparently was compensated during her hospitalization and coronary artery disease with stable as well-she did continue hemodialysis while in the hospital.  Currently she has no acute complaints although appears to be quite weak-her blood sugar is registering high this evening-she does have an order for sliding scale insulin-in addition to the Lantus.  Vital signs appear to be stable I do note a temperature of 99.2.  She does have a history previously of acute pulmonary edema-that resolved with hemodialysis-and nephrology is managing this     Past Medical History  Diagnosis Date  . Retinopathy   . Hypertension   . Chronic diastolic CHF (congestive heart failure)     has a normal EF per echo 03/2011 & 04/2012 with diastolic dysfunction  . CAD (coronary artery disease)     Prior PCI in 1998 x 2; s/p cath in 2001, negative Myoview in 2008 & 2011, s/p cath in June  2012 with calcified disease of the proximal LCX with normal flow reserve. She has been managed medically due to her other morbidities  . Cerebrovascular disease   . Peripheral vascular disease     prior angiography showing RSFA stenosis, left anterior tibial and left posterior tibial stenoses  . Hypercholesterolemia   . Diabetes mellitus     insulin dependent  . Hypothyroidism   . Hiatal hernia   . Degenerative joint disease   . Osteoporosis   . Vitamin D deficiency   . Anemia of chronic disease   . ESRD (end stage renal disease)     a. CKD progressed to ESRD 10/2013. b. s/p LUE basilic transposition by vascular on 11/08/13.  . Dialysis patient     [facility], and Friday  . Hip fracture, left   . Acute myocardial infarction of other lateral wall, initial episode of care     Past Surgical History  Procedure Laterality Date  . Abdominal aortogram  07/27/2003    by Dr. Chales Abrahams  . Bilateral lower extremity angiogram  07/27/2003    by Dr. Chales Abrahams  . Selective angiography,right superficial femoral artery, right and left iliac arteries  07/27/2003    by Dr. Chales Abrahams  . Measurement of gradient right and left iliac arteries  07/27/2003    by Dr. Chales Abrahams  . Bilateral cataract sugery  2000 and 2002    by Dr. Cecilie Kicks  . Repair left hip fracture  04/2008    by Dr. Charlann Boxer  . Bascilic vein transposition Left 11/08/2013    Procedure: BASCILIC  VEIN TRANSPOSITION;  Surgeon: Pryor Ochoa, MD;  Location: [facility] OR;  Service: Vascular;  Laterality: Left;  Marland Kitchen Eye surgery      Vision loss in Right Eye      Medication List       T .               acetaminophen 650 MG CR tablet  Commonly known as:  TYLENOL  Take 650 mg by mouth every 8 (eight) hours as needed for pain.     amLODipine 10 MG tablet  Commonly known as:  NORVASC  Take 1 tablet (10 mg total) by mouth daily.     aspirin EC 81 MG tablet  Take 81 mg by mouth daily.     atorvastatin 20 MG tablet  Commonly known as:  LIPITOR  Take 1 tablet  (20 mg total) by mouth daily at 6 PM.     carvedilol 12.5 MG tablet  Commonly known as:  COREG  Take 1 tablet (12.5 mg total) by mouth 2 (two) times daily with a meal.     cloNIDine 0.1 MG tablet  Commonly known as:  CATAPRES  Take 0.1 mg by mouth daily at 10 pm.     DSS 100 MG Caps  Take 100 mg by mouth 2 (two) times daily.     famotidine 20 MG tablet  Commonly known as:  PEPCID  Take 20 mg by mouth daily.     HEPARIN SODIUM (BOVINE) IJ  Inject 5,000 Units as directed 3 (three) times daily.     hydrALAZINE 50 MG tablet  Commonly known as:  APRESOLINE  Take 50 mg by mouth every 6 (six) hours.     HYDROcodone-acetaminophen 5-325 MG per tablet  Commonly known as:  NORCO/VICODIN  Take 1-2 tablets by mouth every 6 (six) hours as needed for moderate pain.     insulin aspart 100 UNIT/ML injection  Commonly known as:  novoLOG  - Inject 0-9 Units into the skin at 7 PM --. CBG < 70: implement hypoglycemia protocol  - CBG 70 - 120: 0 units  - CBG 121 - 150: 1 unit  - CBG 151 - 200: 2 units  - CBG 201 - 250: 3 units  - CBG 251 - 300: 5 units  - CBG 301 - 350: 7 units  - CBG 351 - 400: 9 units  - CBG > 400: call MD.     insulin aspart 100 UNIT/ML injection  Commonly known as:  novoLOG  Inject 5 Units into the skin 2 (two) times daily. Before breakfast and before lunch     insulin glargine 100 UNIT/ML injection  Commonly known as:  LANTUS  Inject 0 8Units total) into the skin daily.     isosorbide mononitrate 20 MG tablet  Commonly known as:  ISMO,MONOKET  Take 1 tablet (20 mg total) by mouth 2 (two) times daily.     levothyroxine 88 MCG tablet  Commonly known as:  SYNTHROID, LEVOTHROID  Take 88 mcg by mouth daily before breakfast.     ondansetron 4 MG tablet  Commonly known as:  ZOFRAN  Take 4 mg by mouth every 6 (six) hours as needed for nausea or vomiting.     senna-docusate 8.6-50 MG per tablet  Commonly known as:  Senokot-S  Take 2 tablets by mouth 2  (two) times daily.     sevelamer 800 MG tablet  Commonly known as:  RENAGEL  Take 1,600 mg by mouth 3 (three)  times daily with meals.     ticagrelor 90 MG Tabs tablet  Commonly known as:  BRILINTA  Take 1 tablet (90 mg total) by mouth 2 (two) times daily.     Vitamin D 1000 UNITS capsule  Take 1,000 Units by mouth daily.      Of note she continues on cephapirin vancomycin on dialysis days this is administered at dialysis  No orders of the defined types were placed in this encounter.    Immunization History  Administered Date(s) Administered  . Influenza Split 09/01/2011, 08/25/2012  . Influenza Whole 09/12/2008, 09/03/2009, 08/26/2010  . Influenza,inj,Quad PF,36+ Mos 08/16/2013, 08/21/2014  . Pneumococcal Polysaccharide-23 04/23/2009    History  Substance Use Topics  . Smoking status: Former Smoker    Types: Cigarettes    Quit date: 12/07/1974  . Smokeless tobacco: Never Used     Comment: pt started smoking in 1956  . Alcohol Use: No    Family history is noncontributory    Review of Systems   GENERAL: no fevers, +fatigue, appetite changes--feels weak SKIN: No itching, rash has chronic lower extremity wounds as noted above EYES: No eye pain, redness, discharge EARS: No earache, tinnitus, change in hearing NOSE: No congestion, drainage or bleeding  MOUTH/THROAT: No mouth or tooth pain, No sore throat  RESPIRATORY: No cough, wheezing, SOB CARDIAC: No chest pain, palpitations, lower extremity edema  GI: No abdominal pain, No N/V/D or constipation, No heartburn or reflux  GU: No dysuria, frequency or urgency, or incontinence  MUSCULOSKELETAL: No unrelieved bone/joint pain NEUROLOGIC: No headache, dizziness or focal weakness PSYCHIATRIC: No overt anxiety or sadness. Sleeps well. No behavior issue.                       Physical Exam Temperature 99.2 pulse 65 respirations 16 blood pressure 118/44 GENERAL APPEARANCE: Alert, conversant. Appropriately groomed.  No acute distress aappears quite frail.  SKIN: No diaphoresis rash--; LLE dresed over heel and tibial area-this is followed by wound care HEAD: Normocephalic, atraumatic  EYES: Conjunctiva/lids clear. Pupils round, reactive. EOMs intact.  EARS: External exam WNL, canals clear. Hearing grossly normal.  NOSE: No deformity or discharge.  MOUTH/THROAT: Lips w/o lesions.  RESPIRATORY: Breathing is even, unlabored. Lung sounds are clear   CARDIOVASCULAR: Heart RRR no murmurs, rubs or gallops. No peripheral edema.   GASTROINTESTINAL: Abdomen is soft, non-tender, not distended w/ normal bowel sounds GENITOURINARY: Bladder non tender, not distended  MUSCULOSKELETAL: No abnormal joints or musculature--has generalized frailty and weakness-again wrapping of her wounds on the lower extremity NEUROLOGIC: Oriented X3. Cranial nerves 2-12 grossly intact. Moves all extremities no tremor. PSYCHIATRIC: Mood and affect appropriate to situation, no behavioral issues--is not speaking much appears quite weak  Patient Active Problem List   Diagnosis Date Noted  . Diabetes mellitus with hyperosmolarity without hyperglycemic hyperosmolar nonketotic coma 09/01/2014  . Decubitus ulcer of ankle, stage 2 09/01/2014  . Acute myocardial infarction of other lateral wall, initial episode of care   . DKA (diabetic ketoacidoses) 08/20/2014  . Acute pulmonary edema 08/20/2014  . Chest pain 08/20/2014  . Hyperosmolar non-ketotic state in patient with type 2 diabetes mellitus 08/20/2014  . Protein-calorie malnutrition, severe 07/23/2014  . Hip fracture 07/22/2014  . Acetabulum fracture, left 07/22/2014  . Pelvic fracture 07/22/2014  . Acetabular fracture 07/22/2014  . End stage renal disease 12/26/2013  . Hypothyroidism   . T2DM (type 2 diabetes mellitus) 10/29/2013  . Celiac artery stenosis 08/20/2013  . Mesenteric artery  stenosis 08/20/2013  . AKI (acute kidney injury) 08/13/2013  . ESRD (end stage renal disease)  04/26/2013  . PAD (peripheral artery disease) 02/15/2013  . Type 2 diabetes mellitus 02/15/2013  . Chronic diastolic heart failure 04/06/2012  . Laryngopharyngeal reflux disease 02/29/2012  . GERD (gastroesophageal reflux disease) 02/29/2012  . Diabetic peripheral neuropathy associated with type 2 diabetes mellitus 02/18/2012  . Edema of both legs 01/21/2012  . RETINOPATHY 08/31/2009  . VITAMIN D DEFICIENCY 12/12/2008  . HYPERCHOLESTEROLEMIA 01/24/2008  . ANEMIA OF CHRONIC DISEASE 01/24/2008  . HYPERTENSION 01/24/2008  . CEREBROVASCULAR DISEASE 01/24/2008  . Peripheral vascular disease with pain at rest 01/24/2008  . HIATAL HERNIA 01/24/2008  . DEGENERATIVE JOINT DISEASE 01/24/2008  . OSTEOPOROSIS 01/24/2008  . HYPOTHYROIDISM 01/23/2008  . CAD 01/23/2008  . DM (diabetes mellitus), type 2 with renal complications 11/18/1955   Labs.  11/25/2014.  Sodium 132 potassium 5.1 BUN 41 creatinine 3.73.  WBC 8.8 hemoglobin 9.5 platelets 172.  11/22/2014.  Liver function tests within normal limits except alkaline phosphatase 233-albumin 2.5    Component Value Date/Time   WBC 5.7 08/25/2014 0350   WBC 9.3 08/08/2014   RBC 2.74* 08/25/2014 0350   RBC 3.48* 03/20/2011 1715   HGB 9.1* 08/25/2014 0350   HCT 26.4* 08/25/2014 0350   PLT 178 08/25/2014 0350   MCV 96.4 08/25/2014 0350   LYMPHSABS 0.6* 08/20/2014 0821   MONOABS 0.7 08/20/2014 0821   EOSABS 0.0 08/20/2014 0821   BASOSABS 0.0 08/20/2014 0821    CMP     Component Value Date/Time   NA 136* 08/25/2014 0350   NA 131* 08/08/2014   K 4.0 08/25/2014 0350   CL 99 08/25/2014 0350   CO2 23 08/25/2014 0350   GLUCOSE 177* 08/25/2014 0350   BUN 29* 08/25/2014 0350   BUN 44* 08/08/2014   CREATININE 3.77* 08/25/2014 0350   CREATININE 3.8* 08/08/2014   CALCIUM 8.1* 08/25/2014 0350   CALCIUM 8.1* 11/07/2013 1320   PROT 6.7 05/22/2014 1421   ALBUMIN 2.2* 08/25/2014 0350   AST 28 08/08/2014   ALT 30 08/08/2014   ALKPHOS 185* 08/08/2014   BILITOT 0.9 05/22/2014  1421   GFRNONAA 11* 08/25/2014 0350   GFRAA 12* 08/25/2014 0350    Assessment and Plan  Acute pulmonary edema -Resolved with HD  - t  -cont volume management per Nephrology    CAD  At this point appears to be stable-she is followed by cardiology-again with a significant history here of CHF diastolic-as well as recent MI-she does continue on a beta blocker-a statin-aspirin-as well as Brilanta   Diabetes mellitus with hyperosmolarity without hyperglycemic hyperosmolar nonketotic coma This continues to be a very challenging situation patient is a very brittle diabetic-blood sugar has been running high this evening-she has received sliding scale insulin initially 7 units-after recheckap proximally two hours later sugars still registering high she will receive an additional 4 units-will need frequent checking here since she does have wide fluctuations   Weakness-lethargy-this will have to be monitored-according nursing staff she has this presentation fairly frequently-she is on low-dose Neurontin as well as Norco this will have to be monitored--apparently this presentation is common after dialysis   \    ESRD (end stage renal disease--she is on hemodialysis 3 days a week  Acetabulum fracture, left -This has been followed by orthopedics-she also has worked with therapy she does not appear to be having discomfort at this time     Decubitus ulcer of ankle,--as well as a  tibial fracture left lower leg-she is receiving IV antibiotics at dialysis-also has an angiogram scheduled for next week-she is followed closely by wound care in the facility-including the wound care physician     Hypothyroidism Continue synthroid-  ANEMIA OF CHRONIC DISEASE This appears to have stabilized a hemoglobin running in the 9 range HTN-this will have to be monitored she on numerous agents including hydralazine-Norvasc-clonidine-Coreg-as well as Imdur    [id number]-of note greater than 45 minutes spent  assessing patient-discussing her status with nursing staff as well as with family in the room-and coordinating and formulating a plan of care for numerous diagnoses-of note greater than 50% of time spent coordinating plan of care    HYPERCHOLESTEROLEMIA Continue lipitor

## 2014-12-02 MED ORDER — SODIUM CHLORIDE 0.9 % IJ SOLN: 3.0000 mL | INTRAMUSCULAR | Status: DC | PRN

## 2014-12-03 ENCOUNTER — Encounter (HOSPITAL_COMMUNITY)
Admission: RE | Disposition: A | Discharge status: Home / Self Care | Payer: Self-pay | Source: Ambulatory Visit | Attending: Vascular Surgery

## 2014-12-03 ENCOUNTER — Ambulatory Visit (HOSPITAL_COMMUNITY)
Admission: RE | Admit: 2014-12-03 | Discharge: 2014-12-03 | Disposition: A | Discharge status: Home / Self Care | Payer: Medicare Other | Source: Ambulatory Visit | Attending: Vascular Surgery | Admitting: Vascular Surgery

## 2014-12-03 ENCOUNTER — Encounter: Payer: Self-pay | Admitting: Internal Medicine

## 2014-12-03 ENCOUNTER — Non-Acute Institutional Stay (SKILLED_NURSING_FACILITY): Payer: Medicare Other | Admitting: Internal Medicine

## 2014-12-03 DIAGNOSIS — E162 Hypoglycemia, unspecified: Secondary | ICD-10-CM | POA: Diagnosis not present

## 2014-12-03 DIAGNOSIS — I739 Peripheral vascular disease, unspecified: Secondary | ICD-10-CM | POA: Diagnosis not present

## 2014-12-03 DIAGNOSIS — R627 Adult failure to thrive: Secondary | ICD-10-CM

## 2014-12-03 DIAGNOSIS — S81802A Unspecified open wound, left lower leg, initial encounter: Secondary | ICD-10-CM | POA: Insufficient documentation

## 2014-12-03 DIAGNOSIS — N189 Chronic kidney disease, unspecified: Secondary | ICD-10-CM

## 2014-12-03 DIAGNOSIS — Z5309 Procedure and treatment not carried out because of other contraindication: Secondary | ICD-10-CM | POA: Diagnosis not present

## 2014-12-03 DIAGNOSIS — N186 End stage renal disease: Secondary | ICD-10-CM

## 2014-12-03 DIAGNOSIS — S81802D Unspecified open wound, left lower leg, subsequent encounter: Secondary | ICD-10-CM

## 2014-12-03 DIAGNOSIS — E1122 Type 2 diabetes mellitus with diabetic chronic kidney disease: Secondary | ICD-10-CM

## 2014-12-03 LAB — GLUCOSE, CAPILLARY: Glucose-Capillary: 59 mg/dL — ABNORMAL LOW (ref 70–99)

## 2014-12-03 SURGERY — ABDOMINAL AORTAGRAM
Anesthesia: LOCAL

## 2014-12-03 NOTE — Progress Notes (Signed)
Patient ID: Nicole [name], female   DOB: [birth year], 79 y.o.   MRN: [id number]    This is an acute visit.  Level care skilled.  Facility [facility].  Chief complaint-acute visit status secondary to failure to thrive-patient requesting discontinuance of dialysis  .  History of present illness.  Patient is a 79 year old female with end-stage renal disease on hemodialysis-with diabetes insulin requiring-chronic diastolic CHF with a history of acute MI in September 2015 status post stent to left circumflex-also with lower extremity wounds. She has just returned from the hospital as of late last week.  She was sent to the hospital after she was found unresponsive at the facility on the floor-with a CBG of 33-she was awake and alert on arrival to the ER-.  She also has an ulcer on her heel and was started on IV antibiotics previously at the facility.  .  In regards to chronic ulcers left heel right calf her antibiotic was continued-withangiogram scheduled for today--- she actually went over to hospital for this but refuses the procedure.  She also has refused dialysis over the weekend.  She has told staff and family that she no longer wants dialysis and is quite adamant about this-also has been refusing her NovoLog insulin although apparently she does take her Lantus at night.   Her sugars continue to be very variable ranging from reading high to CBG's  in the 50s in fact it was 59 apparently when she went to the hospital this morning for the angiogram.  When I saw her on the 24th her CBG was reading high throughout the evening-she did receive NovoLog insulin per sliding scale-in fact her blood sugar the next morning was 55  Her diastolic CHF apparently was compensated during her hospitalization and coronary artery disease with stable as well-she did continue hemodialysis while in the hospital--although again she is now refusing the dialysis   she continues to be very weak-is not  eating well-her son Nicole [name] is in the room along with his wife-she has made it clear to them that she does not want be on dialysis and that she is ready for the end-of-life.  She confirm this when I talked to her at bedside this afternoon-I did tell her that dialysis was essential to maintain her life-and she expressed understanding but said that she was ready to go.  I also discussed this with her son can over the phone-patient and family are in agreement that the goal here is comfort care without any aggressive interventions including dialysis or further aggressive management of her wounds and other medical conditions  Family wishes a hospice consult which I have ordered with [facility]-the family already has spoken with them -and apparently has some point the plans are that she can be transferred to a bed at the hospice facility there  Currently she is denying any pain or discomfort appears to be resting peacefully vital signs are stable  .        Past Medical History  Diagnosis Date  . Retinopathy   . Hypertension   . Chronic diastolic CHF (congestive heart failure)     has a normal EF per echo 03/2011 & 04/2012 with diastolic dysfunction  . CAD (coronary artery disease)     Prior PCI in 1998 x 2; s/p cath in 2001, negative Myoview in 2008 & 2011, s/p cath in June 2012 with calcified disease of the proximal LCX with normal flow reserve. She has been managed medically due to  her other morbidities  . Cerebrovascular disease   . Peripheral vascular disease     prior angiography showing RSFA stenosis, left anterior tibial and left posterior tibial stenoses  . Hypercholesterolemia   . Diabetes mellitus     insulin dependent  . Hypothyroidism   . Hiatal hernia   . Degenerative joint disease   . Osteoporosis   . Vitamin D deficiency   . Anemia of chronic disease   . ESRD (end stage renal disease)     a. CKD progressed to ESRD 10/2013. b. s/p LUE basilic transposition by  vascular on 11/08/13.  . [facility] patient     [facility], and Friday  . Hip fracture, left   . Acute myocardial infarction of other lateral wall, initial episode of care     Past Surgical History  Procedure Laterality Date  . Abdominal aortogram  07/27/2003    by Dr. Chales AbrahamsGupta  . Bilateral lower extremity angiogram  07/27/2003    by Dr. Chales AbrahamsGupta  . Selective angiography,right superficial femoral artery, right and left iliac arteries  07/27/2003    by Dr. Chales AbrahamsGupta  . Measurement of gradient right and left iliac arteries  07/27/2003    by Dr. Chales AbrahamsGupta  . Bilateral cataract sugery  2000 and 2002    by Dr. Cecilie KicksJacklin  . Repair left hip fracture  04/2008    by Dr. Charlann Boxerlin  . Bascilic vein transposition Left 11/08/2013    Procedure: BASCILIC VEIN TRANSPOSITION;  Surgeon: Pryor OchoaJames D Lawson, MD;  [facility];  Service: Vascular;  Laterality: Left;  Marland Kitchen. Eye surgery      Vision loss in Right Eye      Medication List       T .               acetaminophen 650 MG CR tablet  Commonly known as:  TYLENOL  Take 650 mg by mouth every 8 (eight) hours as needed for pain.     amLODipine 10 MG tablet  Commonly known as:  NORVASC  Take 1 tablet (10 mg total) by mouth daily.     aspirin EC 81 MG tablet  Take 81 mg by mouth daily.     atorvastatin 20 MG tablet  Commonly known as:  LIPITOR  Take 1 tablet (20 mg total) by mouth daily at 6 PM.     carvedilol 12.5 MG tablet  Commonly known as:  COREG  Take 1 tablet (12.5 mg total) by mouth 2 (two) times daily with a meal.     cloNIDine 0.1 MG tablet  Commonly known as:  CATAPRES  Take 0.1 mg by mouth daily at 10 pm.     DSS 100 MG Caps  Take 100 mg by mouth 2 (two) times daily.     famotidine 20 MG tablet  Commonly known as:  PEPCID  Take 20 mg by mouth daily.     HEPARIN SODIUM (BOVINE) IJ  Inject 5,000 Units as directed 3 (three) times daily.     hydrALAZINE 50 MG tablet  Commonly known as:  APRESOLINE  Take 50 mg by mouth every 6 (six) hours.      HYDROcodone-acetaminophen 5-325 MG per tablet  Commonly known as:  NORCO/VICODIN  Take 1-2 tablets by mouth every 6 (six) hours as needed for moderate pain.     insulin aspart 100 UNIT/ML injection  Commonly known as:  novoLOG  - Inject 0-9 Units into the skin at 7 PM --. CBG < 70: implement  hypoglycemia protocol  - CBG 70 - 120: 0 units  - CBG 121 - 150: 1 unit  - CBG 151 - 200: 2 units  - CBG 201 - 250: 3 units  - CBG 251 - 300: 5 units  - CBG 301 - 350: 7 units  - CBG 351 - 400: 9 units  - CBG > 400: call MD.     insulin aspart 100 UNIT/ML injection  Commonly known as:  novoLOG  Inject 5 Units into the skin 2 (two) times daily. Before breakfast and before lunch     insulin glargine 100 UNIT/ML injection  Commonly known as:  LANTUS  Inject 0 8Units total) into the skin daily.     isosorbide mononitrate 20 MG tablet  Commonly known as:  ISMO,MONOKET  Take 1 tablet (20 mg total) by mouth 2 (two) times daily.     levothyroxine 88 MCG tablet  Commonly known as:  SYNTHROID, LEVOTHROID  Take 88 mcg by mouth daily before breakfast.     ondansetron 4 MG tablet  Commonly known as:  ZOFRAN  Take 4 mg by mouth every 6 (six) hours as needed for nausea or vomiting.     senna-docusate 8.6-50 MG per tablet  Commonly known as:  Senokot-S  Take 2 tablets by mouth 2 (two) times daily.     sevelamer 800 MG tablet  Commonly known as:  RENAGEL  Take 1,600 mg by mouth 3 (three) times daily with meals.     ticagrelor 90 MG Tabs tablet  Commonly known as:  BRILINTA  Take 1 tablet (90 mg total) by mouth 2 (two) times daily.     Vitamin D 1000 UNITS capsule  Take 1,000 Units by mouth daily.      Of note she continues on cephapirin vancomycin on dialysis days this is administered at dialysis  No orders of the defined types were placed in this encounter.    Immunization History  Administered Date(s) Administered  . Influenza Split 09/01/2011, 08/25/2012  . Influenza Whole  09/12/2008, 09/03/2009, 08/26/2010  . Influenza,inj,Quad PF,36+ Mos 08/16/2013, 08/21/2014  . Pneumococcal Polysaccharide-23 04/23/2009    History  Substance Use Topics  . Smoking status: Former Smoker    Types: Cigarettes    Quit date: 12/07/1974  . Smokeless tobacco: Never Used     Comment: pt started smoking in 1956  . Alcohol Use: No    Family history is noncontributory    Review of Systems   GENERAL: no fevers, +fatigue, has a poor appetite--feels weak SKIN: No itching, rash has chronic lower extremity wounds as noted above EYES: No eye pain, redness, discharge EARS: No earache, tinnitus, change in hearing NOSE: No congestion, drainage or bleeding  MOUTH/THROAT: No mouth or tooth pain, No sore throat  RESPIRATORY: No cough, wheezing, SOB CARDIAC: No chest pain, palpitations, lower extremity edema  GI: No abdominal pain, No N/V/D or constipation, No heartburn or reflux  GU: No dysuria, frequency or urgency, or incontinence  MUSCULOSKELETAL: No unrelieved bone/joint pain--has chronic ulcers on the left leg but is not complaining of pain currently  NEUROLOGIC: No headache, dizziness or focal weakness PSYCHIATRIC: No overt anxiety or sadness. Sleeps well. No behavior issue.                       Physical Exam Temperature 98.1 pulse 60 respirations 20 blood pressure 148/51-118/44 in this range  GENERAL APPEARANCE: Alert, conversant. Appropriately groomed. No acute distress aappears quite frail-but resting comfortably.  SKIN: No diaphoresis rash--; LLE dresed over heel and tibial area-this is followed by wound care HEAD: Normocephalic, atraumatic  EYES: Conjunctiva/lids clear. Pupils round, reactive. EOMs intact.  EARS: External exam WNL, canals clear. Hearing grossly normal.  NOSE: No deformity or discharge.  MOUTH/THROAT: Lips w/o lesions--.  RESPIRATORY: Breathing is even, unlabored. Lung sounds are clear   CARDIOVASCULAR: Heart RRR no murmurs, rubs or gallops.  No peripheral edema.   GASTROINTESTINAL: Abdomen is soft, non-tender, not distended w/ normal bowel sounds GENITOURINARY: Bladder non tender, not distended  MUSCULOSKELETAL: No abnormal joints or musculature--has generalized frailty and weakness-again wrapping of her wounds on the lower extremity NEUROLOGIC: Oriented X3. Cranial nerves 2-12 grossly intact. Moves all extremities no tremor. PSYCHIATRIC: Mood and affect appropriate to situation, no behavioral issues----again was quite adamant that she does not want dialysis and she is ready for the end-of-life-she appear at peace with this   Patient Active Problem List   Diagnosis Date Noted  . Diabetes mellitus with hyperosmolarity without hyperglycemic hyperosmolar nonketotic coma 09/01/2014  . Decubitus ulcer of ankle, stage 2 09/01/2014  . Acute myocardial infarction of other lateral wall, initial episode of care   . DKA (diabetic ketoacidoses) 08/20/2014  . Acute pulmonary edema 08/20/2014  . Chest pain 08/20/2014  . Hyperosmolar non-ketotic state in patient with type 2 diabetes mellitus 08/20/2014  . Protein-calorie malnutrition, severe 07/23/2014  . Hip fracture 07/22/2014  . Acetabulum fracture, left 07/22/2014  . Pelvic fracture 07/22/2014  . Acetabular fracture 07/22/2014  . End stage renal disease 12/26/2013  . Hypothyroidism   . T2DM (type 2 diabetes mellitus) 10/29/2013  . Celiac artery stenosis 08/20/2013  . Mesenteric artery stenosis 08/20/2013  . AKI (acute kidney injury) 08/13/2013  . ESRD (end stage renal disease) 04/26/2013  . PAD (peripheral artery disease) 02/15/2013  . Type 2 diabetes mellitus 02/15/2013  . Chronic diastolic heart failure 04/06/2012  . Laryngopharyngeal reflux disease 02/29/2012  . GERD (gastroesophageal reflux disease) 02/29/2012  . Diabetic peripheral neuropathy associated with type 2 diabetes mellitus 02/18/2012  . Edema of both legs 01/21/2012  . RETINOPATHY 08/31/2009  . VITAMIN D  DEFICIENCY 12/12/2008  . HYPERCHOLESTEROLEMIA 01/24/2008  . ANEMIA OF CHRONIC DISEASE 01/24/2008  . HYPERTENSION 01/24/2008  . CEREBROVASCULAR DISEASE 01/24/2008  . Peripheral vascular disease with pain at rest 01/24/2008  . HIATAL HERNIA 01/24/2008  . DEGENERATIVE JOINT DISEASE 01/24/2008  . OSTEOPOROSIS 01/24/2008  . HYPOTHYROIDISM 01/23/2008  . CAD 01/23/2008  . DM (diabetes mellitus), type 2 with renal complications 11/18/1955   Labs.  11/25/2014.  Sodium 132 potassium 5.1 BUN 41 creatinine 3.73.  WBC 8.8 hemoglobin 9.5 platelets 172.  11/22/2014.  Liver function tests within normal limits except alkaline phosphatase 233-albumin 2.5    Component Value Date/Time   WBC 5.7 08/25/2014 0350   WBC 9.3 08/08/2014   RBC 2.74* 08/25/2014 0350   RBC 3.48* 03/20/2011 1715   HGB 9.1* 08/25/2014 0350   HCT 26.4* 08/25/2014 0350   PLT 178 08/25/2014 0350   MCV 96.4 08/25/2014 0350   LYMPHSABS 0.6* 08/20/2014 0821   MONOABS 0.7 08/20/2014 0821   EOSABS 0.0 08/20/2014 0821   BASOSABS 0.0 08/20/2014 0821    CMP     Component Value Date/Time   NA 136* 08/25/2014 0350   NA 131* 08/08/2014   K 4.0 08/25/2014 0350   CL 99 08/25/2014 0350   CO2 23 08/25/2014 0350   GLUCOSE 177* 08/25/2014 0350   BUN 29* 08/25/2014 0350   BUN 44*  08/08/2014   CREATININE 3.77* 08/25/2014 0350   CREATININE 3.8* 08/08/2014   CALCIUM 8.1* 08/25/2014 0350   CALCIUM 8.1* 11/07/2013 1320   PROT 6.7 05/22/2014 1421   ALBUMIN 2.2* 08/25/2014 0350   AST 28 08/08/2014   ALT 30 08/08/2014   ALKPHOS 185* 08/08/2014   BILITOT 0.9 05/22/2014 1421   GFRNONAA 11* 08/25/2014 0350   GFRAA 12* 08/25/2014 0350    Assessment and Plan    Failure to thrive-as noted above patient is declining dialysis-as discussed above she is aware that dialysis is essential-but says she is ready for the end-of-life and does not want dialysis-certainly will honor her wishes-this was discussed with family at bedside as well as with her son Nicole [name] over the phone I  have written an order for hospice consult by [facility] [place] [facility]-my understanding is more than likely she will be transferred to the hospice facility there-family certainly is in agreement with this.  Emphasis will be made on keeping her comfortable-she is taking her when necessary Norco at present-at some point I suspect we will need Roxanol but at this point she appears stable in this regard.  She is also on numerous medications which I think we can largely he discontinue-will keep her antihypertensive secondary to concerns that she has some elevated systolics-- expect without dialysis this will continue to be an issue.--Also will keep her nausea medication when necessary  Obviously we'll keep her pain medication her Norco as well.  We will discontinue her other medications including Synthroid Pepcid atorvastatin vitamin D aspirin Lantus Imdur-.  Also her antibiotics will be discontinued since she receives these at dialysis.  Also will discontinue her insulin-patient has requested this  In regards to her other medical issues including coronary artery disease history of pulmonary edema-hypothyroidism-and history of hip fracture-these clinically appears stable although certainly without dialysis and aggressive treatment this may deteriorate-however emphasis now is on patient's quality of care with minimal invasive procedures and medications  This plan was discussed as well with Dr. Leanord Hawking.  She does appear comfortable -certainly will reevaluate as needed-I suspect her stay in the facility will be fairly short before transfer to the hospice facility in [facility].  [id number] note greater than 40 minutes spent assessing patient-addressing family and patient concerns at bedside-discussion of patient's status with nursing staff-and formulating a plan of care with family and patient input.  Of note greater than 50% of time spent coordinating plan of care

## 2014-12-04 ENCOUNTER — Non-Acute Institutional Stay (SKILLED_NURSING_FACILITY): Payer: Medicare Other | Admitting: Internal Medicine

## 2014-12-04 ENCOUNTER — Encounter: Payer: Self-pay | Admitting: Internal Medicine

## 2014-12-04 DIAGNOSIS — I739 Peripheral vascular disease, unspecified: Secondary | ICD-10-CM

## 2014-12-04 DIAGNOSIS — E038 Other specified hypothyroidism: Secondary | ICD-10-CM

## 2014-12-04 DIAGNOSIS — E162 Hypoglycemia, unspecified: Secondary | ICD-10-CM

## 2014-12-04 DIAGNOSIS — N186 End stage renal disease: Secondary | ICD-10-CM

## 2014-12-04 DIAGNOSIS — E034 Atrophy of thyroid (acquired): Secondary | ICD-10-CM

## 2014-12-04 DIAGNOSIS — S329XXG Fracture of unspecified parts of lumbosacral spine and pelvis, subsequent encounter for fracture with delayed healing: Secondary | ICD-10-CM

## 2014-12-04 DIAGNOSIS — I5032 Chronic diastolic (congestive) heart failure: Secondary | ICD-10-CM

## 2014-12-04 DIAGNOSIS — S81802D Unspecified open wound, left lower leg, subsequent encounter: Secondary | ICD-10-CM

## 2014-12-04 DIAGNOSIS — S32402G Unspecified fracture of left acetabulum, subsequent encounter for fracture with delayed healing: Secondary | ICD-10-CM

## 2014-12-04 DIAGNOSIS — R627 Adult failure to thrive: Secondary | ICD-10-CM

## 2014-12-04 NOTE — Assessment & Plan Note (Signed)
Continue levothyroxine 

## 2014-12-04 NOTE — Assessment & Plan Note (Signed)
Compensated 

## 2014-12-04 NOTE — Assessment & Plan Note (Signed)
Pt made decision yesterday to stop dialysis and all interventions. She is ready for comfort care. Hospice was consulted and Arlo, PA, initiated termination of non essential meds; Pt was left on her BP meds -  norvasc, coreg,clonidine and hydralazine- on Norco and Zofran. All else, including insulin was stopped. [facility] and I have conferred personally with their representative here at SNF.

## 2014-12-04 NOTE — Assessment & Plan Note (Addendum)
On dialysis M,W,F

## 2014-12-04 NOTE — Assessment & Plan Note (Signed)
With LLE ulcer

## 2014-12-04 NOTE — Assessment & Plan Note (Signed)
Presentation to ED and reason for fall inSNF;lantus was resumed lower dose

## 2014-12-04 NOTE — Assessment & Plan Note (Signed)
Minimal healing 

## 2014-12-04 NOTE — Assessment & Plan Note (Signed)
Contnue wound care with santyl;antibiotics started to be continued at dialysis, vancomycin and rocephin

## 2014-12-04 NOTE — Assessment & Plan Note (Signed)
Minimal healing

## 2014-12-04 NOTE — Progress Notes (Signed)
MRN: [id number] Name: Nicole [name]  Sex: female Age: 79 y.o. DOB: 10/21/1935  PSC #:[facility] Facility/Room: 409 Level Of Care: SNF Provider: Merrilee Seashore D Emergency Contacts: Extended Emergency Contact Information Primary Emergency Contact: Slager,Ken Address: [address]          [place], [place] [address] [place] Home Phone: [phone] Relation: Son Secondary Emergency Contact: Alean Rinne [place] of [place] Home Phone: [phone] Relation: Son  Code Status: DNR  Allergies: Ibuprofen; Prednisone; and Procrit  Chief Complaint  Patient presents with  . New Admit To SNF    HPI: Patient is 79 y.o. female who was admitted to hospital for hypoglycemia, with fall at SNF, admitted to SNF and continuation of IV abx at dialysis.  Past Medical History  Diagnosis Date  . Retinopathy   . Hypertension   . Chronic diastolic CHF (congestive heart failure)     Echo (9/15):  Mild LVH, EF 50-55%, mild MR, mod LAE  . CAD (coronary artery disease)     a.  Prior PCI in 1998 x 2; b.  s/p cath in 2001, c.  negative Myoview in 2008 & 2011, d.  s/p cath in June 2012 with calcified disease of the proximal LCX with normal flow reserve. e. NSTEMI (9/15):  LHC (9/15):  Ostial LM mild disease, mid LAD mild disease, ostial Dx mod disease, prox CFX 90%, OM1 stent ok, ostial RCA disease (non-dominant); LVEDP 24 mmHg >>> PCI:  2.25 x 16 Promus DES to CFX   . Cerebrovascular disease     a. Carotid US (8/14):  Bilateral ICA 40-59% >> repeat 1 year  . Peripheral vascular disease     prior angiography showing RSFA stenosis, left anterior tibial and left posterior tibial stenoses  . Hypercholesterolemia   . Diabetes mellitus     insulin dependent  . Hypothyroidism   . Hiatal hernia   . Degenerative joint disease   . Osteoporosis   . Vitamin D deficiency   . Anemia of chronic disease   . ESRD (end stage renal disease)     a. CKD progressed to ESRD 10/2013. b.  s/p LUE basilic transposition by vascular on 11/08/13.  . Dialysis patient     Paul[facility] Fruit, and Friday  . Left acetabular fracture 07/2014    complex - managemed medically  . Myocardial infarction   . Depression     Past Surgical History  Procedure Laterality Date  . Abdominal aortogram  07/27/2003    by Dr. Chales Abrahams  . Bilateral lower extremity angiogram  07/27/2003    by Dr. Chales Abrahams  . Selective angiography,right superficial femoral artery, right and left iliac arteries  07/27/2003    by Dr. Chales Abrahams  . Measurement of gradient right and left iliac arteries  07/27/2003    by Dr. Chales Abrahams  . Bilateral cataract sugery  2000 and 2002    by Dr. Cecilie Kicks  . Repair left hip fracture  04/2008    by Dr. Charlann Boxer  . Bascilic vein transposition Left 11/08/2013    Procedure: BASCILIC VEIN TRANSPOSITION;  Surgeon: Pryor Ochoa, MD;  Location: [facility] OR;  Service: Vascular;  Laterality: Left;  Marland Kitchen Eye surgery      Vision loss in Right Eye  . Renal angiogram N/A 08/21/2013    Procedure: RENAL ANGIOGRAM;  Surgeon: Micheline Chapman, MD;  Location: [facility] CATH LAB;  Service: Cardiovascular;  Laterality: N/A;  . Right heart catheterization N/A 10/31/2013    Procedure: RIGHT HEART CATH;  Surgeon: Dolores Pattyaniel R Bensimhon, MD;  [facility];  Service: Cardiovascular;  Laterality: N/A;  . Left heart catheterization with coronary angiogram N/A 08/20/2014    Procedure: LEFT HEART CATHETERIZATION WITH CORONARY ANGIOGRAM;  Surgeon: Corky CraftsJayadeep S Varanasi, MD;  [facility];  Service: Cardiovascular;  Laterality: N/A;  . Vitrectomy        Medication List    Notice    This visit is during an admission. Changes to the med list made in this visit will be reflected in the After Visit Summary of the admission.      No orders of the defined types were placed in this encounter.    Immunization History  Administered Date(s) Administered  . Influenza Split 09/01/2011, 08/25/2012  . Influenza Whole 09/12/2008, 09/03/2009,  08/26/2010  . Influenza,inj,Quad PF,36+ Mos 08/16/2013, 08/21/2014  . Pneumococcal Polysaccharide-23 04/23/2009    History  Substance Use Topics  . Smoking status: Former Smoker    Types: Cigarettes    Quit date: 12/07/1974  . Smokeless tobacco: Never Used     Comment: pt started smoking in 1956  . Alcohol Use: No    Family history is noncontributory    Review of Systems  UTO except pt does not want dialysis or other interventions     Filed Vitals:   12/04/14 1313  BP: 146/50  Pulse: 81  Temp: 97.5 F (36.4 C)  Resp: 20    Physical Exam  GENERAL APPEARANCE: Alert, conversant,  No acute distress.  SKIN: No diaphoresis rash HEAD: Normocephalic, atraumatic  EYES: Conjunctiva/lids clear. Pupils round, reactive. EOMs intact.  EARS: External exam WNL, canals clear. Hearing grossly normal.  NOSE: No deformity or discharge.  MOUTH/THROAT: Lips w/o lesions  RESPIRATORY: Breathing is even, unlabored. Lung sounds are clear   CARDIOVASCULAR: Heart RRR no murmurs, rubs or gallops. No peripheral edema.   GASTROINTESTINAL: Abdomen is soft, non-tender, not distended w/ normal bowel sounds. GENITOURINARY: Bladder non tender, not distended  MUSCULOSKELETAL: No abnormal joints or musculature;dressing LLE/ankle NEUROLOGIC:  Cranial nerves 2-12 grossly intact. PSYCHIATRIC: Mood and affect appropriate to situation, no behavioral issues  Patient Active Problem List   Diagnosis Date Noted  . Wound of left leg 12/03/2014  . FTT (failure to thrive) in adult 12/03/2014  . Chronic ulcers / R heel and R calf 11/23/2014  . Hypoglycemia 11/22/2014  . Acute myocardial infarction of other lateral wall, initial episode of care   . Protein-calorie malnutrition, severe 07/23/2014  . Hip fracture 07/22/2014  . Acetabulum fracture, left 07/22/2014  . Pelvic fracture 07/22/2014  . End stage renal disease 12/26/2013  . Hypothyroidism   . T2DM (type 2 diabetes mellitus) 10/29/2013  . Celiac  artery stenosis 08/20/2013  . Mesenteric artery stenosis 08/20/2013  . PAD (peripheral artery disease) 02/15/2013  . Chronic diastolic heart failure 04/06/2012  . Laryngopharyngeal reflux disease 02/29/2012  . GERD (gastroesophageal reflux disease) 02/29/2012  . Diabetic peripheral neuropathy associated with type 2 diabetes mellitus 02/18/2012  . RETINOPATHY 08/31/2009  . VITAMIN D DEFICIENCY 12/12/2008  . HYPERCHOLESTEROLEMIA 01/24/2008  . Anemia of chronic disease 01/24/2008  . Essential hypertension 01/24/2008  . Cerebrovascular disease 01/24/2008  . HIATAL HERNIA 01/24/2008  . DEGENERATIVE JOINT DISEASE 01/24/2008  . OSTEOPOROSIS 01/24/2008  . Coronary atherosclerosis 01/23/2008  . DM (diabetes mellitus), type 2 with renal complications 11/18/1955    CBC    Component Value Date/Time   WBC 8.8 11/25/2014 0739   WBC 9.3 08/08/2014   RBC 2.96* 11/25/2014 0739  RBC 3.48* 03/20/2011 1715   HGB 9.5* 11/25/2014 0739   HCT 30.9* 11/25/2014 0739   PLT 172 11/25/2014 0739   MCV 104.4* 11/25/2014 0739   LYMPHSABS 0.6* 08/20/2014 0821   MONOABS 0.7 08/20/2014 0821   EOSABS 0.0 08/20/2014 0821   BASOSABS 0.0 08/20/2014 0821    CMP     Component Value Date/Time   NA 132* 11/25/2014 0739   NA 131* 08/08/2014   K 5.1 11/25/2014 0739   CL 94* 11/25/2014 0739   CO2 24 11/25/2014 0739   GLUCOSE 277* 11/25/2014 0739   BUN 41* 11/25/2014 0739   BUN 44* 08/08/2014   CREATININE 3.73* 11/25/2014 0739   CREATININE 3.8* 08/08/2014   CALCIUM 8.5 11/25/2014 0739   CALCIUM 8.1* 11/07/2013 1320   PROT 6.4 11/22/2014 0800   ALBUMIN 2.2* 11/25/2014 0739   AST 14 11/22/2014 0800   ALT 11 11/22/2014 0800   ALKPHOS 233* 11/22/2014 0800   BILITOT 0.4 11/22/2014 0800   GFRNONAA 11* 11/25/2014 0739   GFRAA 12* 11/25/2014 0739    Assessment and Plan  Hypoglycemia Presentation to ED and reason for fall inSNF;lantus was resumed lower dose   End stage renal disease On dialysis  M,W,F  Wound of left leg Contnue wound care with santyl;antibiotics started to be continued at dialysis, vancomycin and rocephin  Pelvic fracture Minimal healing  Acetabulum fracture, left Minimal healing  Chronic diastolic heart failure Compensated  PAD (peripheral artery disease) With LLE ulcer  Hypothyroidism Continue levothyroxine    Margit HanksALEXANDER, Brealynn Contino D, MD

## 2014-12-07 IMAGING — CR DG CHEST 2V
2 series · 2 of 2 positions shown · non-contrast
Comparison: 02/12/2013

CLINICAL DATA: Short of breath

CHEST - 2 VIEW

[w chest pa]
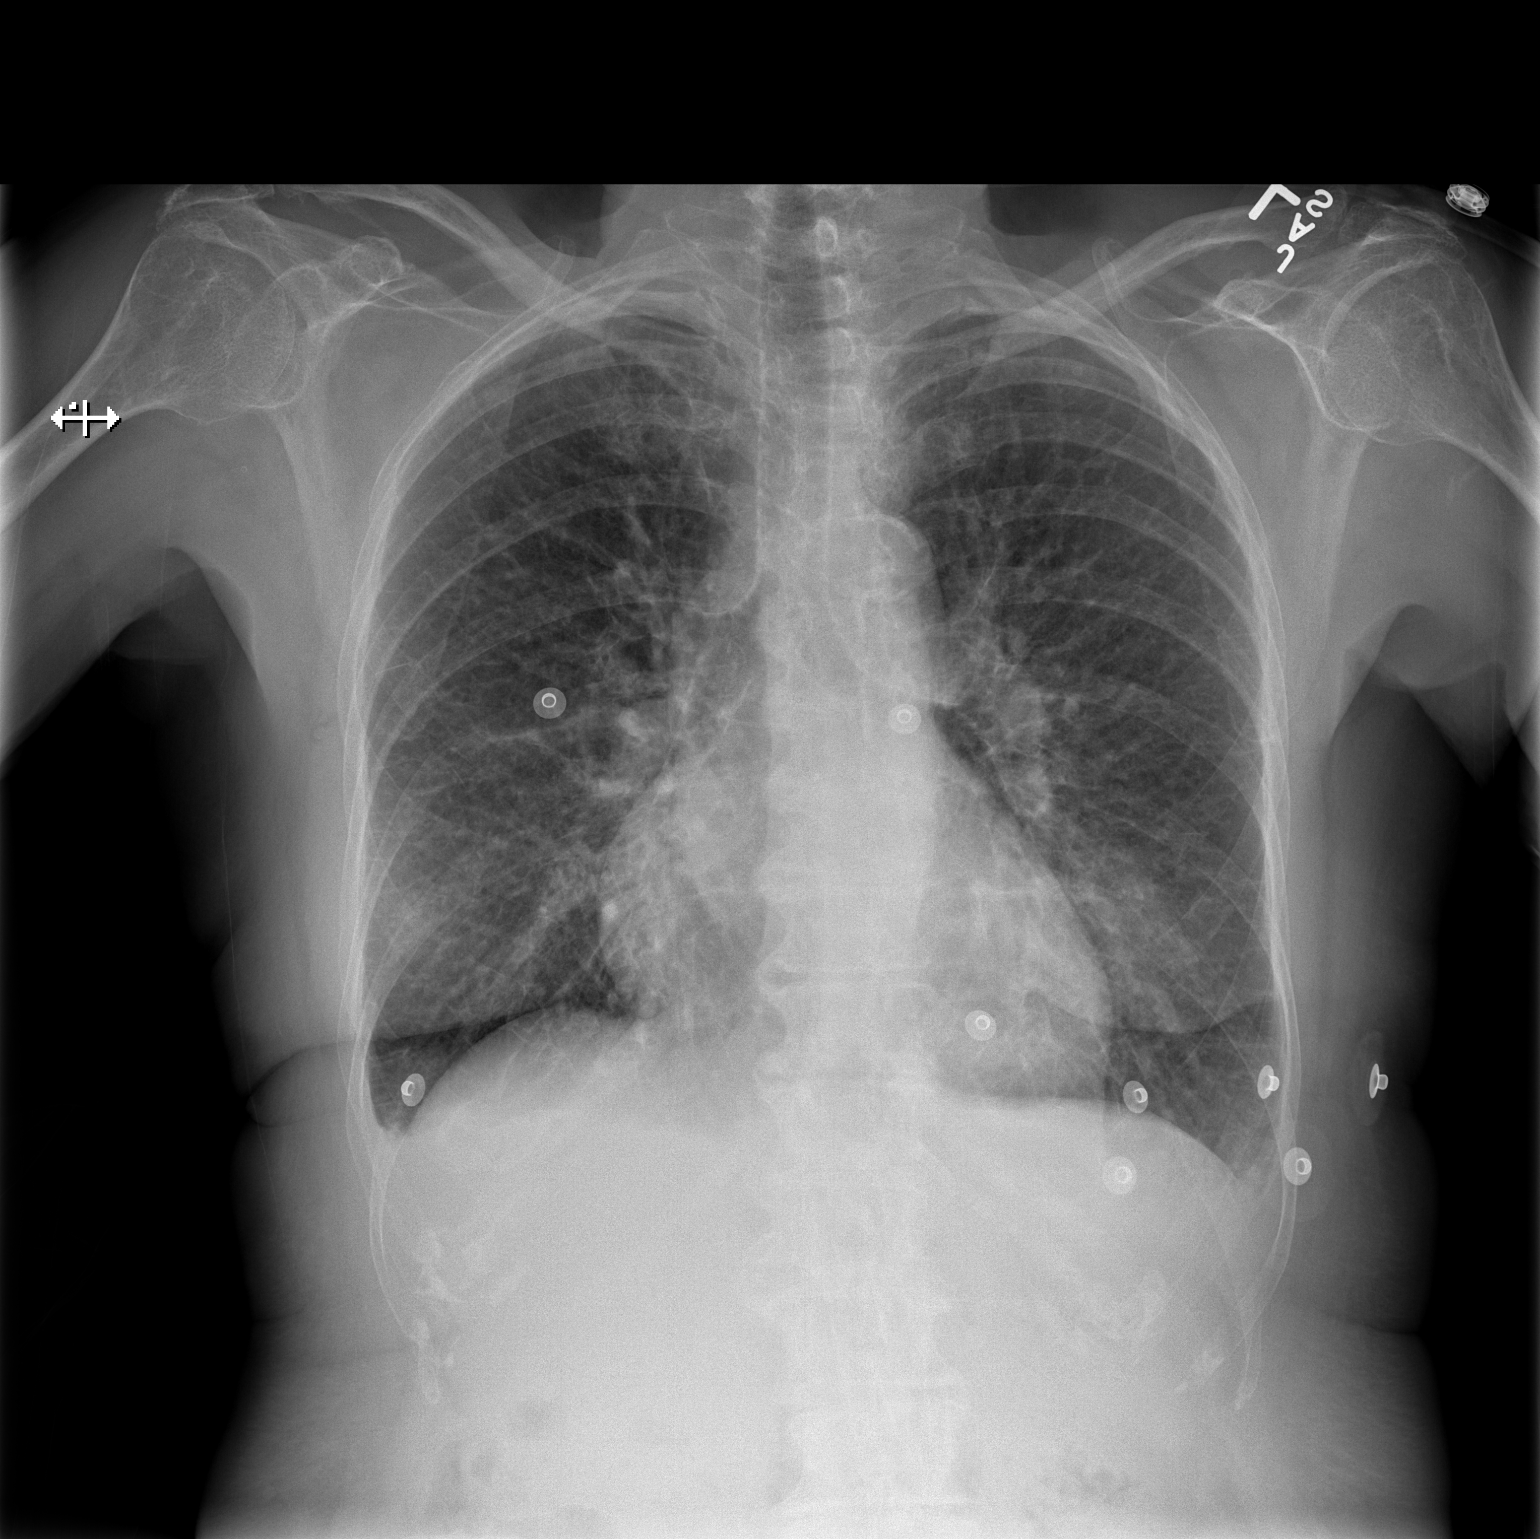

[w chest lat]
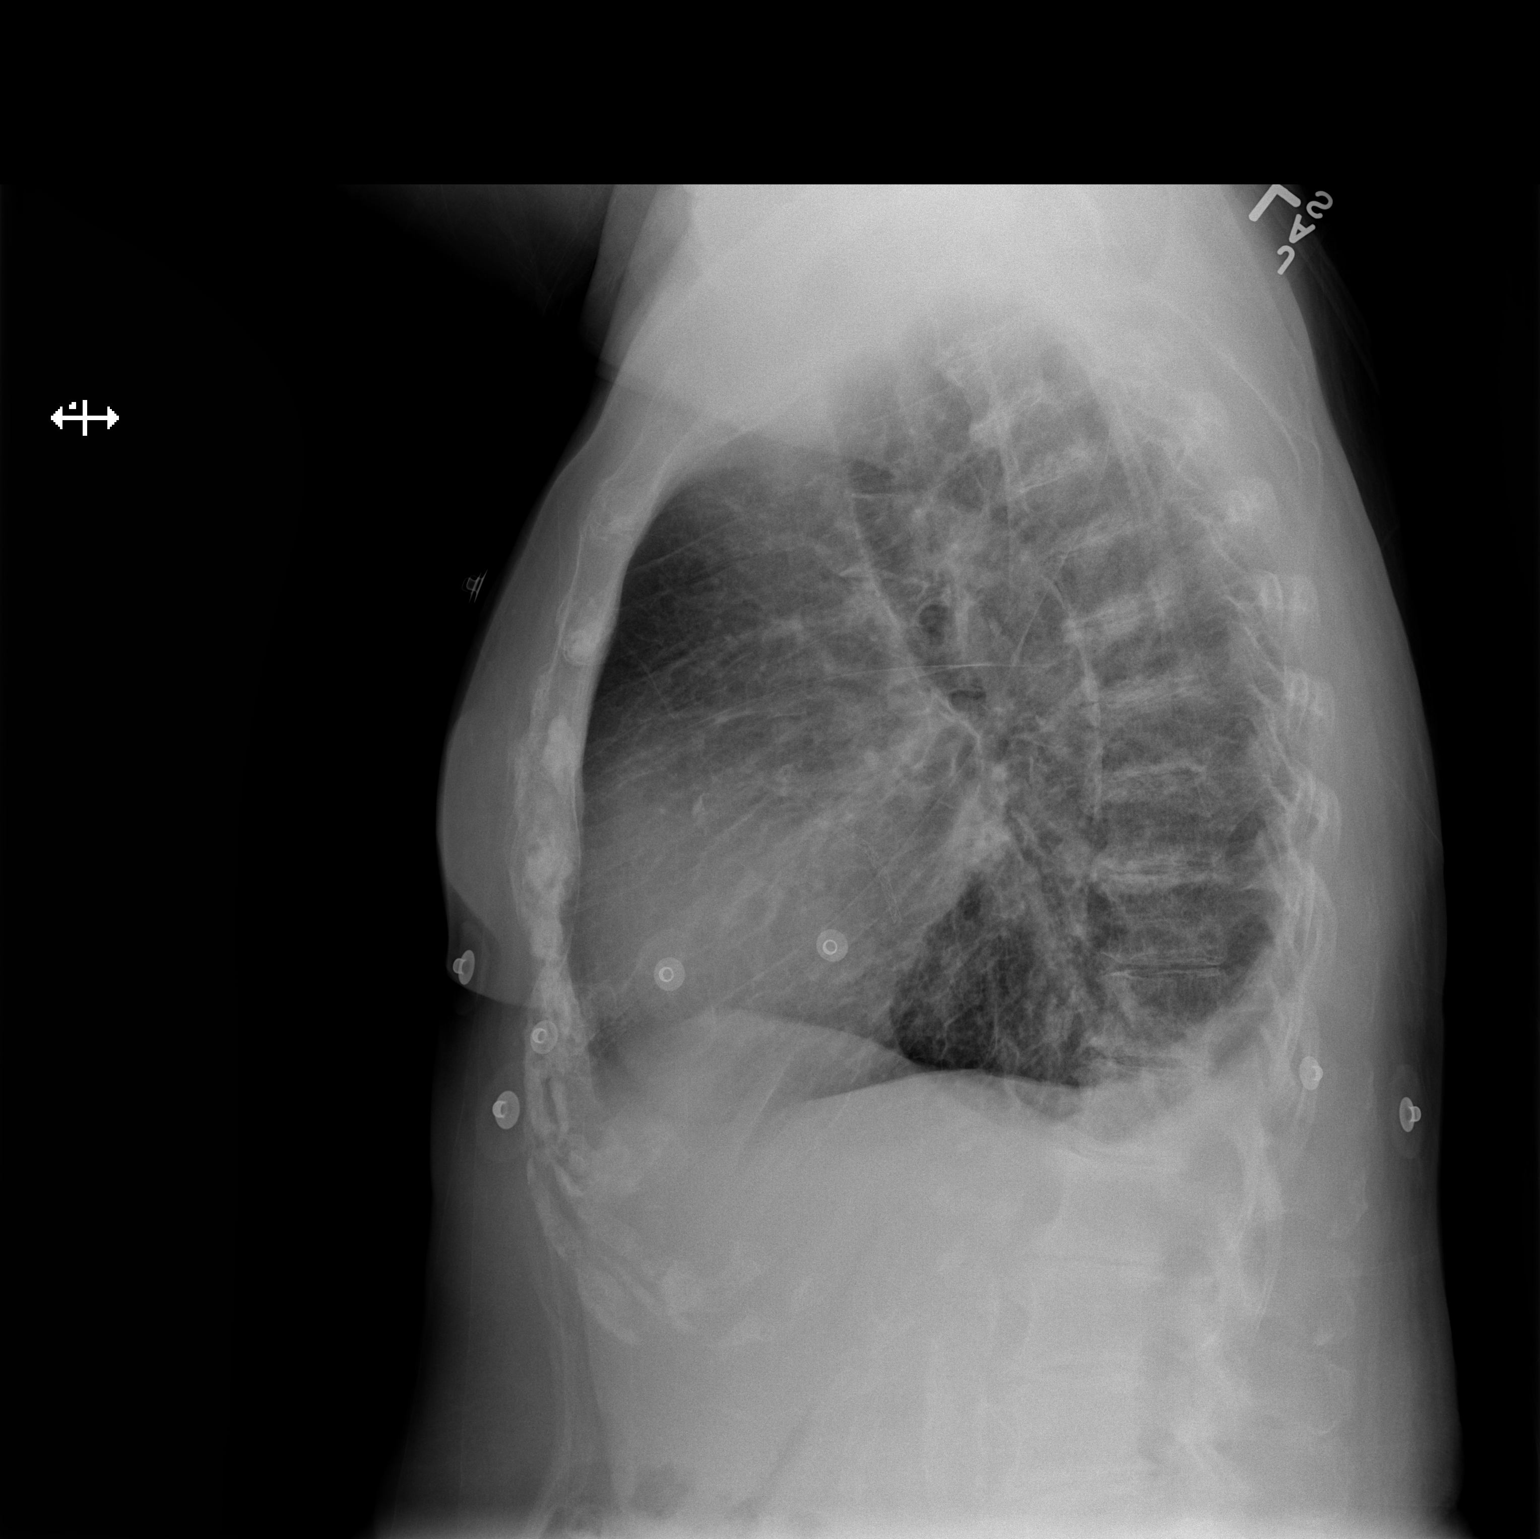

[2 of 2 positions shown; findings below may reference images not displayed]

FINDINGS: Normal cardiac silhouette.  There is fine interstitial
pattern with a linear markings laterally..  Breast tissue node on
the left and right.  There is blunting of the costophrenic angles
slightly improved compared to prior.
IMPRESSION: Small bilateral pleural effusions and interstitial edema.

## 2014-12-07 DEATH — deceased

## 2015-02-26 IMAGING — CR DG CHEST 1V PORT
1 series · 1 of 1 positions shown · non-contrast
Comparison: 10/29/2013

CLINICAL DATA: Follow-up pleural effusion

EXAM:
PORTABLE CHEST - 1 VIEW

[AP]
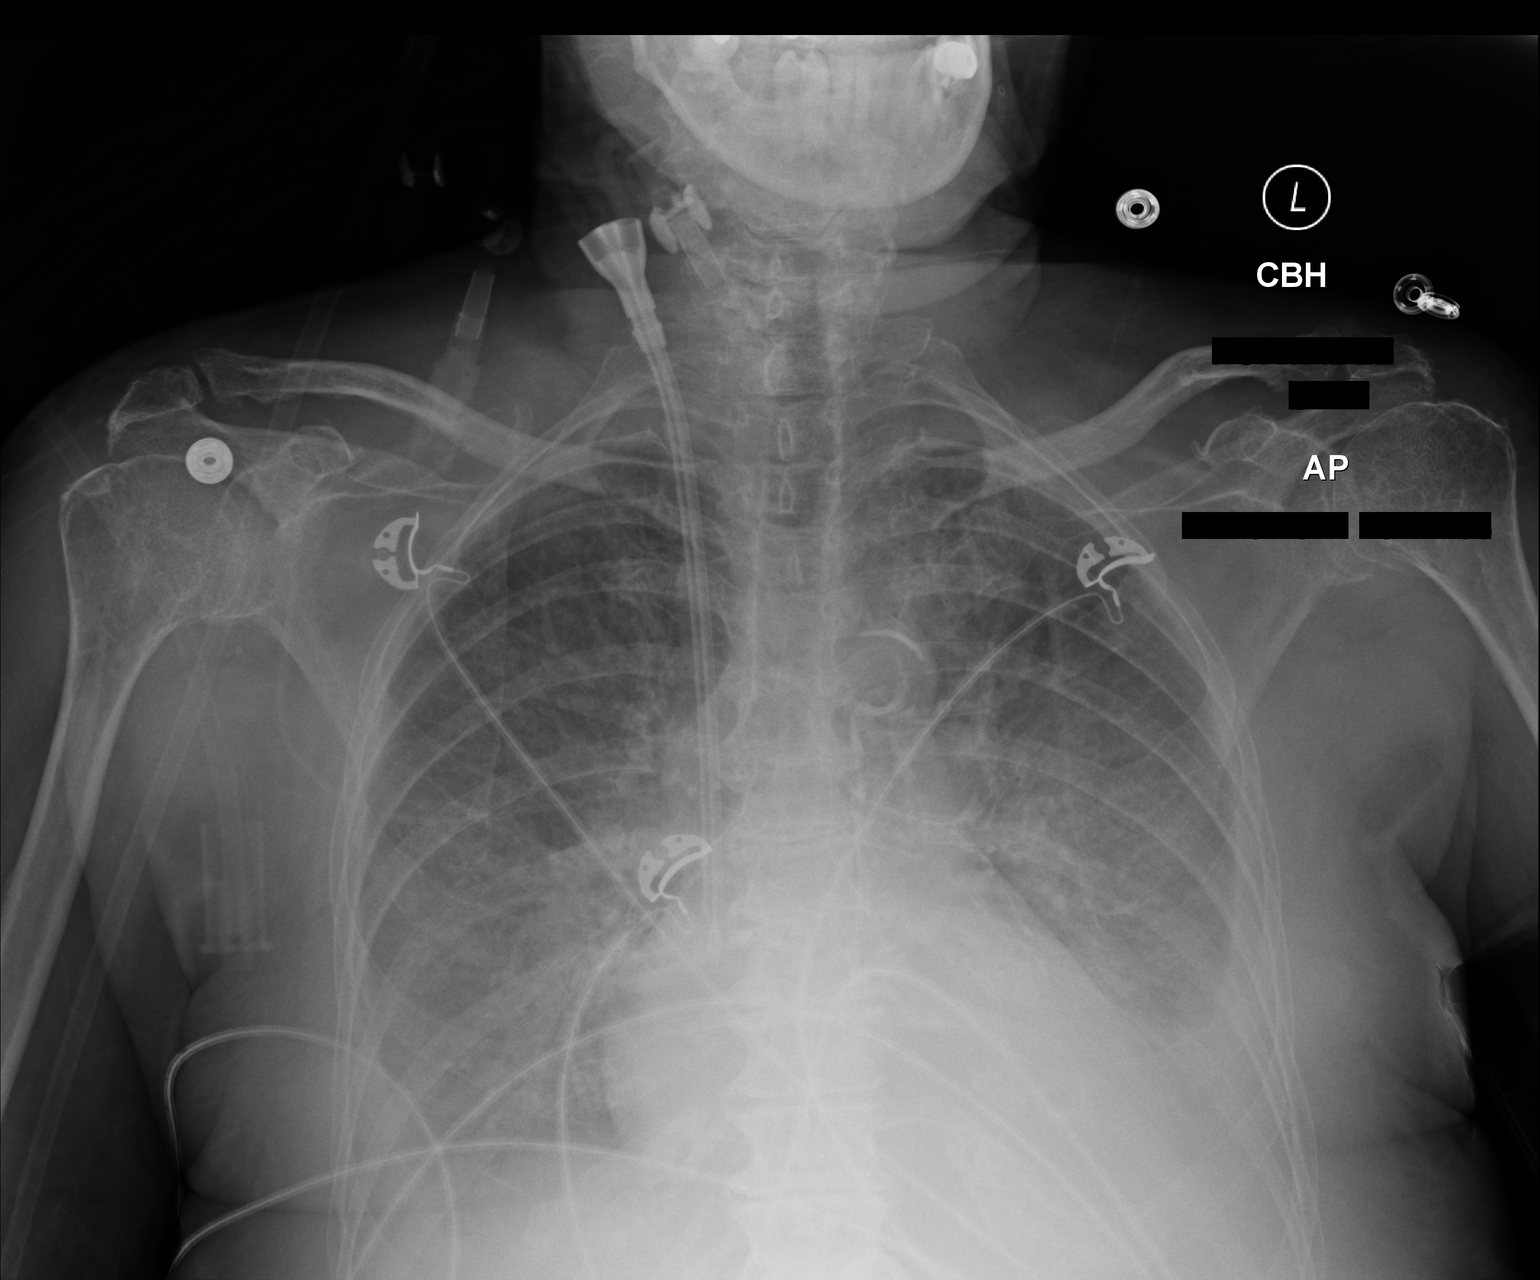

[1 of 1 positions shown; findings below may reference images not displayed]

FINDINGS: Cardiomediastinal silhouette is stable. There is bilateral small
pleural effusion with bilateral basilar atelectasis or infiltrate.
Central mild vascular congestion and mild interstitial prominence
suspicious for pulmonary edema. Atherosclerotic calcifications of
thoracic aorta again noted. Dual lumen right IJ catheter with tip in
the region of SVC right atrium junction.
IMPRESSION: There is bilateral small pleural effusion with bilateral basilar
atelectasis or infiltrate. Central mild vascular congestion and mild
interstitial prominence suspicious for pulmonary edema.
Atherosclerotic calcifications of thoracic aorta again noted. Dual
lumen right IJ catheter with tip in the region of SVC right atrium
junction.

## 2015-10-23 ENCOUNTER — Encounter: Payer: Self-pay | Admitting: Cardiovascular Disease

## 2015-10-23 ENCOUNTER — Encounter: Payer: Self-pay | Admitting: Cardiology

## 2015-12-15 IMAGING — CR DG CHEST 1V PORT
1 series · 1 of 1 positions shown · non-contrast
Comparison: Prior radiograph from 07/22/2014

CLINICAL DATA: Chest pain, shortness of breath

EXAM:
PORTABLE CHEST - 1 VIEW

[AP]
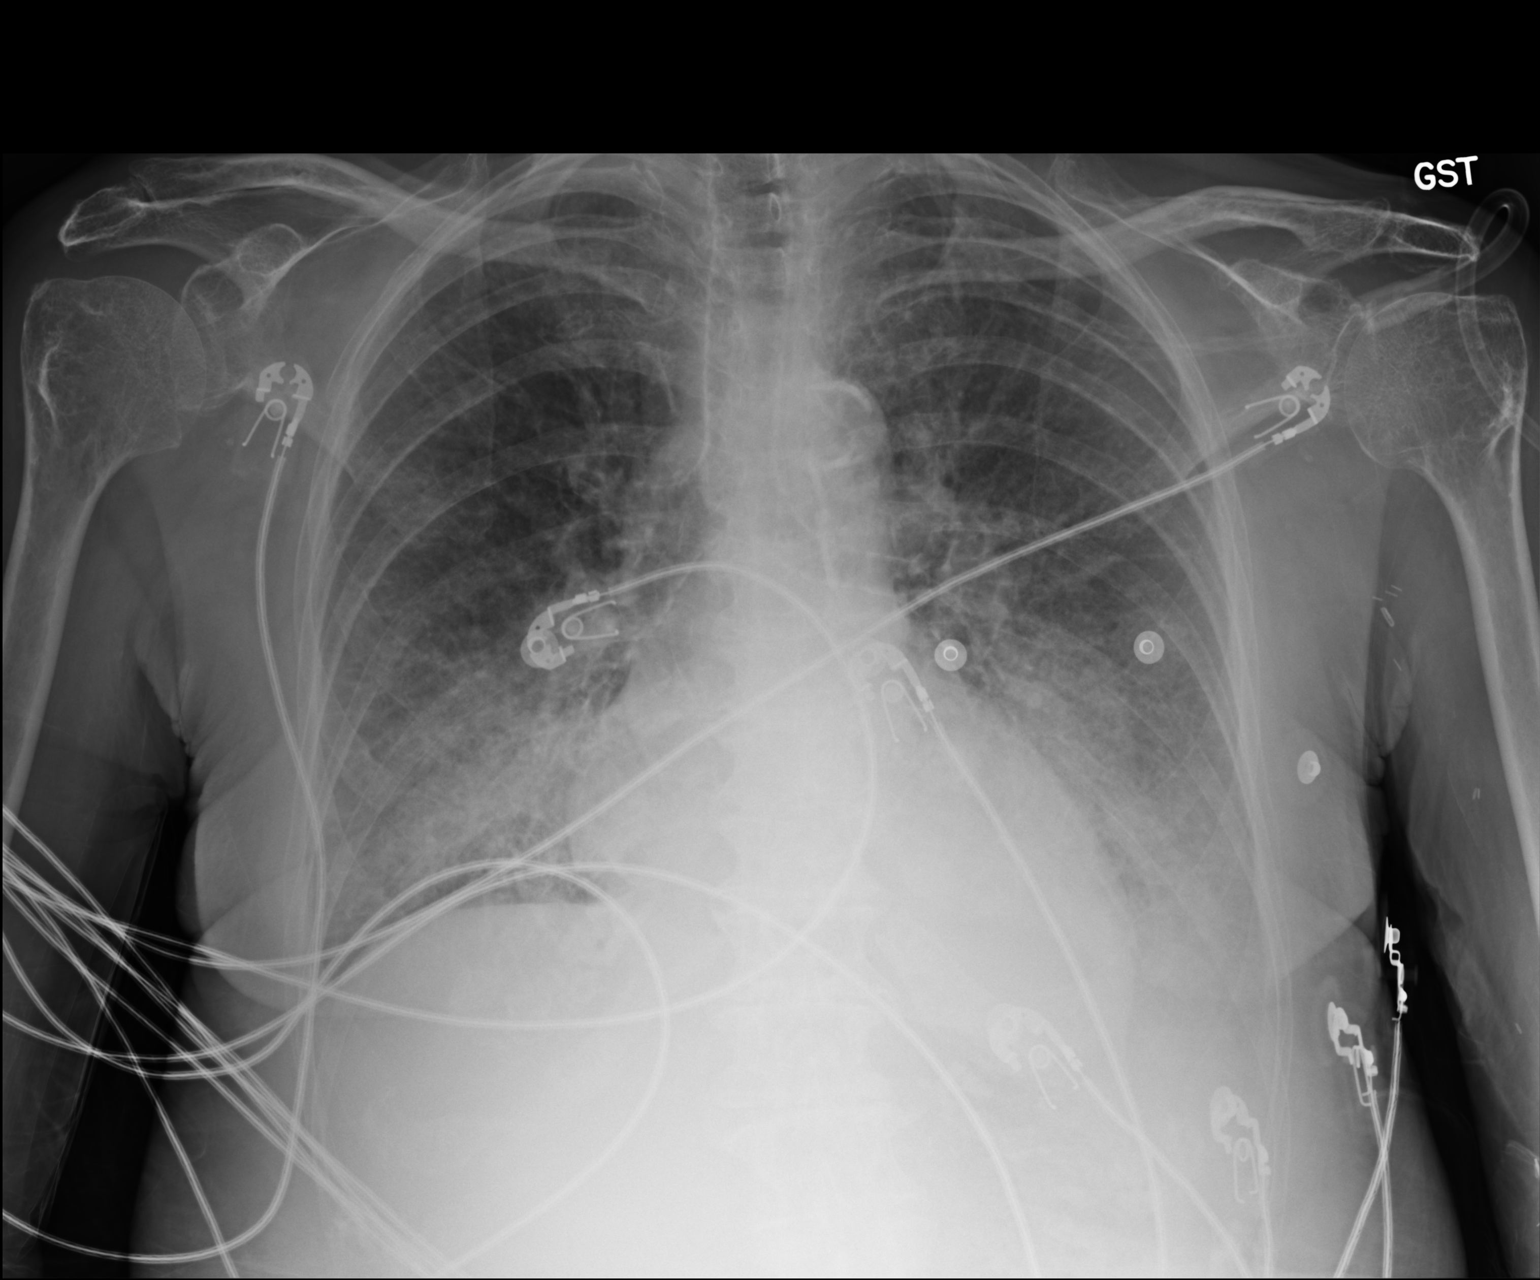

[1 of 1 positions shown; findings below may reference images not displayed]

FINDINGS: Cardiomegaly is stable from prior exam. Atherosclerotic
calcifications noted within the aortic arch.

Hazy bibasilar opacities with prominence of the interstitial
markings present, most compatible with pulmonary edema. Superimposed
infection is not entirely excluded. Probable left pleural effusion
present. No pneumothorax.

No acute osseus abnormality.
IMPRESSION: 1. Cardiomegaly with findings suggestive of bibasilar pulmonary
edema. Superimposed infection could be considered in the correct
clinical setting.
2. Probable small left pleural effusion.
